# Patient Record
Sex: Female | Born: 1940
Health system: Southern US, Community
[De-identification: ages and names within clinical notes are randomized; demographics above are authoritative.]

## PROBLEM LIST (undated history)

## (undated) DIAGNOSIS — IMO0002 Reserved for concepts with insufficient information to code with codable children: Secondary | ICD-10-CM

## (undated) DIAGNOSIS — R9430 Abnormal result of cardiovascular function study, unspecified: Secondary | ICD-10-CM

## (undated) DIAGNOSIS — E785 Hyperlipidemia, unspecified: Secondary | ICD-10-CM

## (undated) DIAGNOSIS — I2510 Atherosclerotic heart disease of native coronary artery without angina pectoris: Secondary | ICD-10-CM

## (undated) DIAGNOSIS — I779 Disorder of arteries and arterioles, unspecified: Secondary | ICD-10-CM

## (undated) DIAGNOSIS — M069 Rheumatoid arthritis, unspecified: Secondary | ICD-10-CM

## (undated) DIAGNOSIS — R609 Edema, unspecified: Secondary | ICD-10-CM

## (undated) DIAGNOSIS — I739 Peripheral vascular disease, unspecified: Secondary | ICD-10-CM

## (undated) DIAGNOSIS — Z789 Other specified health status: Secondary | ICD-10-CM

## (undated) DIAGNOSIS — Z79899 Other long term (current) drug therapy: Secondary | ICD-10-CM

## (undated) HISTORY — DX: Hyperlipidemia, unspecified: E78.5

## (undated) HISTORY — DX: Other long term (current) drug therapy: Z79.899

## (undated) HISTORY — DX: Disorder of arteries and arterioles, unspecified: I77.9

## (undated) HISTORY — PX: ABDOMINAL HYSTERECTOMY: SHX81

## (undated) HISTORY — DX: Abnormal result of cardiovascular function study, unspecified: R94.30

## (undated) HISTORY — PX: CHOLECYSTECTOMY: SHX55

## (undated) HISTORY — DX: Other specified health status: Z78.9

## (undated) HISTORY — DX: Peripheral vascular disease, unspecified: I73.9

## (undated) HISTORY — DX: Reserved for concepts with insufficient information to code with codable children: IMO0002

## (undated) HISTORY — DX: Atherosclerotic heart disease of native coronary artery without angina pectoris: I25.10

## (undated) HISTORY — DX: Edema, unspecified: R60.9

## (undated) HISTORY — DX: Rheumatoid arthritis, unspecified: M06.9

---

## 1998-03-20 ENCOUNTER — Emergency Department (HOSPITAL_COMMUNITY): Admission: EM | Admit: 1998-03-20 | Discharge: 1998-03-20 | Payer: Self-pay | Admitting: Emergency Medicine

## 1998-08-02 ENCOUNTER — Ambulatory Visit (HOSPITAL_COMMUNITY): Admission: RE | Admit: 1998-08-02 | Discharge: 1998-08-02 | Payer: Self-pay | Admitting: Family Medicine

## 1999-09-16 ENCOUNTER — Ambulatory Visit (HOSPITAL_COMMUNITY): Admission: RE | Admit: 1999-09-16 | Discharge: 1999-09-16 | Payer: Self-pay | Admitting: Family Medicine

## 1999-09-16 ENCOUNTER — Encounter: Payer: Self-pay | Admitting: Family Medicine

## 2000-04-06 ENCOUNTER — Emergency Department (HOSPITAL_COMMUNITY): Admission: EM | Admit: 2000-04-06 | Discharge: 2000-04-06 | Payer: Self-pay | Admitting: Emergency Medicine

## 2000-04-06 ENCOUNTER — Encounter: Payer: Self-pay | Admitting: Emergency Medicine

## 2000-10-28 ENCOUNTER — Ambulatory Visit (HOSPITAL_COMMUNITY): Admission: RE | Admit: 2000-10-28 | Discharge: 2000-10-28 | Payer: Self-pay | Admitting: Family Medicine

## 2000-10-28 ENCOUNTER — Encounter: Payer: Self-pay | Admitting: Family Medicine

## 2001-05-17 ENCOUNTER — Emergency Department (HOSPITAL_COMMUNITY): Admission: EM | Admit: 2001-05-17 | Discharge: 2001-05-18 | Payer: Self-pay

## 2001-05-18 ENCOUNTER — Ambulatory Visit (HOSPITAL_COMMUNITY): Admission: RE | Admit: 2001-05-18 | Discharge: 2001-05-18 | Payer: Self-pay

## 2001-05-30 ENCOUNTER — Encounter (HOSPITAL_BASED_OUTPATIENT_CLINIC_OR_DEPARTMENT_OTHER): Payer: Self-pay | Admitting: General Surgery

## 2001-06-01 ENCOUNTER — Encounter (HOSPITAL_BASED_OUTPATIENT_CLINIC_OR_DEPARTMENT_OTHER): Payer: Self-pay | Admitting: General Surgery

## 2001-06-01 ENCOUNTER — Ambulatory Visit (HOSPITAL_COMMUNITY): Admission: RE | Admit: 2001-06-01 | Discharge: 2001-06-03 | Payer: Self-pay | Admitting: General Surgery

## 2001-06-01 ENCOUNTER — Encounter (INDEPENDENT_AMBULATORY_CARE_PROVIDER_SITE_OTHER): Payer: Self-pay | Admitting: *Deleted

## 2001-06-03 ENCOUNTER — Encounter: Payer: Self-pay | Admitting: Cardiovascular Disease

## 2002-12-26 ENCOUNTER — Ambulatory Visit (HOSPITAL_COMMUNITY): Admission: RE | Admit: 2002-12-26 | Discharge: 2002-12-26 | Payer: Self-pay | Admitting: Cardiology

## 2004-08-28 ENCOUNTER — Encounter: Admission: RE | Admit: 2004-08-28 | Discharge: 2004-08-28 | Payer: Self-pay | Admitting: Rheumatology

## 2004-11-28 ENCOUNTER — Ambulatory Visit: Payer: Self-pay | Admitting: Cardiology

## 2006-03-02 ENCOUNTER — Ambulatory Visit: Payer: Self-pay | Admitting: Cardiology

## 2006-07-02 ENCOUNTER — Ambulatory Visit (HOSPITAL_COMMUNITY): Admission: RE | Admit: 2006-07-02 | Discharge: 2006-07-02 | Payer: Self-pay | Admitting: Family Medicine

## 2006-09-18 ENCOUNTER — Encounter: Admission: RE | Admit: 2006-09-18 | Discharge: 2006-09-18 | Payer: Self-pay

## 2007-04-26 ENCOUNTER — Ambulatory Visit: Payer: Self-pay | Admitting: Cardiology

## 2007-12-01 ENCOUNTER — Ambulatory Visit: Payer: Self-pay | Admitting: Gastroenterology

## 2007-12-13 ENCOUNTER — Ambulatory Visit: Payer: Self-pay | Admitting: Gastroenterology

## 2008-08-08 ENCOUNTER — Ambulatory Visit: Payer: Self-pay | Admitting: Cardiology

## 2008-08-16 ENCOUNTER — Ambulatory Visit: Payer: Self-pay

## 2009-07-09 ENCOUNTER — Encounter (INDEPENDENT_AMBULATORY_CARE_PROVIDER_SITE_OTHER): Payer: Self-pay | Admitting: *Deleted

## 2010-07-16 ENCOUNTER — Inpatient Hospital Stay (HOSPITAL_COMMUNITY): Admission: EM | Admit: 2010-07-16 | Discharge: 2010-07-18 | Payer: Self-pay | Admitting: Emergency Medicine

## 2010-07-16 ENCOUNTER — Encounter: Payer: Self-pay | Admitting: Cardiology

## 2010-07-21 ENCOUNTER — Encounter: Payer: Self-pay | Admitting: Cardiology

## 2010-07-22 ENCOUNTER — Ambulatory Visit: Payer: Self-pay | Admitting: Cardiology

## 2010-08-27 ENCOUNTER — Ambulatory Visit: Payer: Self-pay | Admitting: Cardiology

## 2010-09-23 ENCOUNTER — Ambulatory Visit: Payer: Self-pay | Admitting: Cardiology

## 2010-09-23 ENCOUNTER — Ambulatory Visit: Payer: Self-pay

## 2010-09-23 ENCOUNTER — Ambulatory Visit (HOSPITAL_COMMUNITY): Admission: RE | Admit: 2010-09-23 | Discharge: 2010-09-23 | Payer: Self-pay | Admitting: Cardiology

## 2010-09-23 ENCOUNTER — Encounter: Payer: Self-pay | Admitting: Cardiology

## 2010-12-18 NOTE — Assessment & Plan Note (Signed)
Summary: 10day f/u per check out on 9/6/lg  Medications Added FISH OIL   OIL (FISH OIL) 1000mg  once daily      Allergies Added:   Visit Type:  Follow-up Primary Provider:  Dorisann Frames, MD  CC:  CAD.  History of Present Illness: The patient is seen today to followup CAD and edema.  She is not having any chest pain.  I saw her last July 22, 2010.  At that time she had a mild amount of edema.  We put her on a small dose of Lasix with a small dose of potassium and she has diuresed and feels better.    Current Medications (verified): 1)  Prednisone 5 Mg Tabs (Prednisone) .... 2 Tabs Once Daily 2)  Aspirin Ec 325 Mg Tbec (Aspirin) .... Take One Tablet By Mouth Daily 3)  Glimepiride 2 Mg Tabs (Glimepiride) .... Once Daily 4)  Losartan Potassium 100 Mg Tabs (Losartan Potassium) .... Take 1 Tablet By Mouth Once A Day 5)  Methotrexate 2.5 Mg Tabs (Methotrexate Sodium) .... 7 Tabs Every Saturday 6)  Multivitamins   Tabs (Multiple Vitamin) .... Once Daily 7)  Onglyza 5 Mg Tabs (Saxagliptin Hcl) .... Once Daily 8)  Remicade 100 Mg Solr (Infliximab) .... Iv Every 6 Weeks 9)  Acetaminophen 500 Mg  Caps (Acetaminophen) .... Every 4 Hrs As Needed 10)  Furosemide 20 Mg Tabs (Furosemide) .... Take One Tablet By Mouth Daily. 11)  Potassium Chloride Cr 10 Meq Cr-Caps (Potassium Chloride) .... Take One Tablet By Mouth Daily 12)  Fish Oil   Oil (Fish Oil) .... 1000mg  Once Daily  Allergies (verified): 1)  ! Hydrocodone 2)  ! * Statins  Past History:  Past Medical History:  CAD (ICD-414.00)   Cath...2004...60%  distal LAD, 80% osteal Cx (not optimal for PCI), 70%  small RCA..medical therapy  /   nuclear.Marland KitchenMarland Kitchen6/2005...EF  65%...no ischemia * HX INTERMITTANT STEROID USE RHEUMATOID ARTHRITIS (ICD-714.0)....  /  hospitalization August, 2011.... severe RA flare.... DYSLIPIDEMIA (ICD-272.4) Statin intolerance Right carotid bruit Leg discomfort   2009 Edema    September, 2011...improved with  Lasix  Review of Systems       Patient denies fever, chills, headache, sweats, rash, change in vision, change in hearing, chest pain, cough, nausea vomiting, urinary symptoms.  All of the systems are reviewed and are negative.  Vital Signs:  Patient profile:   70 year old female Height:      62 inches Weight:      166 pounds BMI:     30.47 Pulse rate:   70 / minute BP sitting:   132 / 78  (left arm) Cuff size:   regular  Vitals Entered By: Hardin Negus, RMA (August 27, 2010 10:04 AM)  Physical Exam  General:  patient is stable. Head:  head is atraumatic. Eyes:  no xanthelasma. Neck:  there is a soft right carotid bruit.  No jugular venous distention. Chest Wall:  no chest wall tenderness. Lungs:  lungs are clear.  Respiratory effort is nonlabored. Heart:  cardiac exam reveals S1-S2.  There are no clicks or significant murmurs. Abdomen:  abdomen is soft. Msk:  no musculoskeletal deformities. Extremities:  no peripheral edema today. Skin:  no skin rashes. Psych:  patient is oriented to person time and place.  Affect is normal.   Impression & Recommendations:  Problem # 1:  * RIGHT CAROTID BRUIT OCTOBER, 2011 There is a very soft right carotid bruit.  I have carefully reviewed the records and there  is no record of a carotid Doppler ever being done.  Carotid Doppler will be scheduled.  He'll be in touch with her with the result.  Problem # 2:  EDEMA (ICD-782.3) Edema is improved with low-dose Lasix.  This is to be continued.  Problem # 3:  CAD (ICD-414.00)  Her updated medication list for this problem includes:    Aspirin Ec 325 Mg Tbec (Aspirin) .[name]... Take one tablet by mouth daily Coronary disease is stable.  The last evaluation that the patient has had was in 2005 with a nuclear scan at that time.  Her ejection fraction was normal at that time.  We need to reassess her LV function.  This was begun with a two-dimensional echo.  I will be in touch with her with the  result.  Orders: Echocardiogram (Echo)  Problem # 4:  DYSLIPIDEMIA (ICD-272.4) Unfortunately the patient is statin intolerant.  No change in therapy.  Other Orders: Carotid Duplex (Carotid Duplex)  Patient Instructions: 1)  Your physician has requested that you have a carotid duplex. This test is an ultrasound of the carotid arteries in your neck. It looks at blood flow through these arteries that supply the brain with blood. Allow one hour for this exam. There are no restrictions or special instructions. 2)  Your physician has requested that you have an echocardiogram.  Echocardiography is a painless test that uses sound waves to create images of your heart. It provides your doctor with information about the size and shape of your heart and how well your heart's chambers and valves are working.  This procedure takes approximately one hour. There are no restrictions for this procedure. 3)  Your physician wants you to follow-up in:  1 year.  You will receive a reminder letter in the mail two months in advance. If you don't receive a letter, please call our office to schedule the follow-up appointment.

## 2010-12-18 NOTE — Assessment & Plan Note (Signed)
Summary: f2y  Medications Added PREDNISONE 5 MG TABS (PREDNISONE) 2 tabs once daily PROTONIX 40 MG TBEC (PANTOPRAZOLE SODIUM) once daily ASPIRIN EC 325 MG TBEC (ASPIRIN) Take one tablet by mouth daily GLIMEPIRIDE 2 MG TABS (GLIMEPIRIDE) once daily LOSARTAN POTASSIUM 100 MG TABS (LOSARTAN POTASSIUM) Take 1 tablet by mouth once a day METHOTREXATE 2.5 MG TABS (METHOTREXATE SODIUM) 7 tabs every Saturday MULTIVITAMINS   TABS (MULTIPLE VITAMIN) once daily ONGLYZA 5 MG TABS (SAXAGLIPTIN HCL) once daily REMICADE 100 MG SOLR (INFLIXIMAB) IV every 6 weeks ACETAMINOPHEN 500 MG  CAPS (ACETAMINOPHEN) every 4 hrs as needed FUROSEMIDE 20 MG TABS (FUROSEMIDE) Take one tablet by mouth daily. POTASSIUM CHLORIDE CR 10 MEQ CR-CAPS (POTASSIUM CHLORIDE) Take one tablet by mouth daily      Allergies Added: ! HYDROCODONE ! * STATINS  Visit Type:  Follow-up Primary Provider:  Dorisann Frames, MD  CC:  CAD.  History of Present Illness: The patient is seen for follow up of coronary disease.  She is not having any chest pain.  Unfortunately she does have edema.  Also she was hospitalized on July 16, 2010 with a flare of her rheumatoid arthritis.  This was quite painful but stabilized.  She is on low-dose steroids.  Patient underwent catheterization in 2004.  She had a nuclear scan in 2005 showing no ischemia and an ejection fraction of 65%.  Current Medications (verified): 1)  Prednisone 5 Mg Tabs (Prednisone) .... 2 Tabs Once Daily 2)  Protonix 40 Mg Tbec (Pantoprazole Sodium) .... Once Daily 3)  Aspirin Ec 325 Mg Tbec (Aspirin) .... Take One Tablet By Mouth Daily 4)  Glimepiride 2 Mg Tabs (Glimepiride) .... Once Daily 5)  Losartan Potassium 100 Mg Tabs (Losartan Potassium) .... Take 1 Tablet By Mouth Once A Day 6)  Methotrexate 2.5 Mg Tabs (Methotrexate Sodium) .... 7 Tabs Every Saturday 7)  Multivitamins   Tabs (Multiple Vitamin) .... Once Daily 8)  Onglyza 5 Mg Tabs (Saxagliptin Hcl) .... Once  Daily 9)  Remicade 100 Mg Solr (Infliximab) .... Iv Every 6 Weeks 10)  Acetaminophen 500 Mg  Caps (Acetaminophen) .... Every 4 Hrs As Needed  Allergies (verified): 1)  ! Hydrocodone 2)  ! * Statins  Past History:  Past Medical History:  CAD (ICD-414.00)   Cath...2004...60%  distal LAD, 80% osteal Cx (not optimal for PCI), 70%  small RCA..medical therapy  /   nuclear.Marland KitchenMarland Kitchen6/2005...EF  65%...no ischemia * HX INTERMITTANT STEROID USE RHEUMATOID ARTHRITIS (ICD-714.0)....  /  hospitalization August, 2011.... severe RA flare.... DYSLIPIDEMIA (ICD-272.4) Leg discomfort   2009 Edema    September, 2011  Review of Systems       Patient denies fever, chills, headache, sweats, rash, change in vision, change in hearing, chest pain, cough, nausea vomiting, urinary symptoms.  All of the systems are reviewed and are negative.  Vital Signs:  Patient profile:   70 year old female Height:      62 inches Weight:      171 pounds BMI:     31.39 Pulse rate:   59 / minute BP sitting:   122 / 80  (left arm) Cuff size:   regular  Vitals Entered By: Hardin Negus, RMA (July 22, 2010 3:31 PM)  Physical Exam  General:  patient is stable today.  She is overweight. Head:  head is atraumatic. Eyes:  no xanthelasma. Neck:  no jugular distention. Chest Wall:  no chest wall tenderness. Lungs:  lungs are clear.  Respiratory effort is nonlabored. Heart:  cardiac exam reveals S1-S2.  No clicks or significant murmurs. Abdomen:  abdomen is soft. Msk:  no inflamed joints at this time. Extremities:  patient has 1+ peripheral edema Skin:  no skin rashes. Psych:  patient is oriented to person time and place.  Affect is normal.   Impression & Recommendations:  Problem # 1:  EDEMA (ICD-782.3) The patient has significant edema.  She thinks it may be related to Cozaar.  She is on low-dose steroids.  My preference is to try her on a low dose of a diuretic.  This will be done very carefully.  We'll see her  for early followup.  We'll also place her on a low-dose potassium.  I know from her recent hospitalization and her renal function was normal her potassium was 3.8 at that time.  As part of this evaluation I have reviewed the discharge summary from her hospitalization and also the admission history and physical.  Problem # 2:  CAD (ICD-414.00)  Her updated medication list for this problem includes:    Aspirin Ec 325 Mg Tbec (Aspirin) .[name]... Take one tablet by mouth daily  Orders: EKG w/ Interpretation (93000) Coronary disease is stable.  EKG is done today and reviewed by me.  There is no change.  Patient has not had an exercise study many years.  We will look into doing this after her volume status is stable.  Problem # 3:  * HX INTERMITTANT STEROID USE The patient currently is on steroids.  This certainly can affect her volume status.  Problem # 4:  RHEUMATOID ARTHRITIS (ICD-714.0) the patient had a recent severe flare of her rheumatoid arthritis.  I'm hopeful that changing her volume status a little bit will not cause any problems.  Patient Instructions: 1)  Start Furosemide 20mg  daily 2)  Start Potassium daily 3)  Follow up in 10days Prescriptions: POTASSIUM CHLORIDE CR 10 MEQ CR-CAPS (POTASSIUM CHLORIDE) Take one tablet by mouth daily  #30 x 6   Entered by:   Meredith Staggers, RN   Authorized by:   Talitha Givens, MD, [facility]   Signed by:   Meredith Staggers, RN on 07/22/2010   Method used:   Electronically to        [facility] [address] [phone]* (retail)       [address]       [address]       [place], [place]  [address]       Ph: [phone]       Fax: [phone]   RxID:   [id number] FUROSEMIDE 20 MG TABS (FUROSEMIDE) Take one tablet by mouth daily.  #30 x 6   Entered by:   Meredith Staggers, RN   Authorized by:   Talitha Givens, MD, [facility]   Signed by:   Meredith Staggers, RN on 07/22/2010   Method used:   Electronically to        [facility] [address] [phone]*  (retail)       [address]       [address]       [place], [place]  [address]       Ph: [phone]       Fax: [phone]   RxID:   [id number]

## 2010-12-18 NOTE — Miscellaneous (Signed)
  Clinical Lists Changes  Problems: Added new problem of * LEG DISCOMFORT Observations: Added new observation of PAST MED HX:  CAD (ICD-414.00)   Cath...2004...60%  distal LAD, 80% osteal Cx (not optimal for PCI), 70%  small RCA..medical therapy  /   nuclear.Marland KitchenMarland Kitchen6/2005...EF  65%...no ischemia * HX INTERMITTANT STEROID USE RHEUMATOID ARTHRITIS (ICD-714.0) DYSLIPIDEMIA (ICD-272.4) Leg discomfort   2009     (07/21/2010 13:39)       Past History:  Past Medical History:  CAD (ICD-414.00)   Cath...2004...60%  distal LAD, 80% osteal Cx (not optimal for PCI), 70%  small RCA..medical therapy  /   nuclear.Marland KitchenMarland Kitchen6/2005...EF  65%...no ischemia * HX INTERMITTANT STEROID USE RHEUMATOID ARTHRITIS (ICD-714.0) DYSLIPIDEMIA (ICD-272.4) Leg discomfort   2009

## 2010-12-18 NOTE — Letter (Signed)
Summary: [facility]  [facility]   Imported By: Marylou Mccoy 07/22/2010 17:36:04  _____________________________________________________________________  External Attachment:    Type:   Image     Comment:   External Document

## 2011-01-29 LAB — COMPREHENSIVE METABOLIC PANEL
AST: 25 U/L (ref 0–37)
Albumin: 3.3 g/dL — ABNORMAL LOW (ref 3.5–5.2)
BUN: 12 mg/dL (ref 6–23)
Creatinine, Ser: 0.83 mg/dL (ref 0.4–1.2)
GFR calc Af Amer: >60 mL/min (ref >60)
Total Protein: 7.6 g/dL (ref 6.0–8.3)

## 2011-01-29 LAB — MAGNESIUM: Magnesium: 1.7 mg/dL (ref 1.5–2.5)

## 2011-01-29 LAB — URINALYSIS, ROUTINE W REFLEX MICROSCOPIC
Bilirubin Urine: NEGATIVE
Glucose, UA: NEGATIVE mg/dL
Ketones, ur: NEGATIVE mg/dL
Leukocytes, UA: NEGATIVE
Nitrite: NEGATIVE
Protein, ur: NEGATIVE mg/dL
Specific Gravity, Urine: 1.02 (ref 1.005–1.030)
Urobilinogen, UA: 1 mg/dL (ref 0.0–1.0)
pH: 5.5 (ref 5.0–8.0)

## 2011-01-29 LAB — BODY FLUID CULTURE

## 2011-01-29 LAB — CULTURE, BLOOD (ROUTINE X 2)
Culture: NO GROWTH
Culture: NO GROWTH

## 2011-01-29 LAB — CBC
HCT: 31.4 % — ABNORMAL LOW (ref 36.0–46.0)
HCT: 32 % — ABNORMAL LOW (ref 36.0–46.0)
HCT: 35.1 % — ABNORMAL LOW (ref 36.0–46.0)
Hemoglobin: 10.3 g/dL — ABNORMAL LOW (ref 12.0–15.0)
Hemoglobin: 10.6 g/dL — ABNORMAL LOW (ref 12.0–15.0)
Hemoglobin: 11.7 g/dL — ABNORMAL LOW (ref 12.0–15.0)
MCH: 28.2 pg (ref 26.0–34.0)
MCH: 28.3 pg (ref 26.0–34.0)
MCH: 28.5 pg (ref 26.0–34.0)
MCHC: 32.8 g/dL (ref 30.0–36.0)
MCHC: 33.1 g/dL (ref 30.0–36.0)
MCHC: 33.3 g/dL (ref 30.0–36.0)
MCV: 85.6 fL (ref 78.0–100.0)
MCV: 86 fL (ref 78.0–100.0)
MCV: 85.4 fL (ref 78.0–100.0)
Platelets: 240 10*3/uL (ref 150–400)
Platelets: 253 10*3/uL (ref 150–400)
Platelets: 267 10*3/uL (ref 150–400)
RBC: 3.65 MIL/uL — ABNORMAL LOW (ref 3.87–5.11)
RBC: 3.74 MIL/uL — ABNORMAL LOW (ref 3.87–5.11)
RBC: 4.11 MIL/uL (ref 3.87–5.11)
RDW: 15 % (ref 11.5–15.5)
RDW: 15.2 % (ref 11.5–15.5)
RDW: 14.9 % (ref 11.5–15.5)
WBC: 11 10*3/uL — ABNORMAL HIGH (ref 4.0–10.5)
WBC: 9.9 10*3/uL (ref 4.0–10.5)
WBC: 8.4 10*3/uL (ref 4.0–10.5)

## 2011-01-29 LAB — GLUCOSE, CAPILLARY
Glucose-Capillary: 111 mg/dL — ABNORMAL HIGH (ref 70–99)
Glucose-Capillary: 182 mg/dL — ABNORMAL HIGH (ref 70–99)
Glucose-Capillary: 187 mg/dL — ABNORMAL HIGH (ref 70–99)
Glucose-Capillary: 189 mg/dL — ABNORMAL HIGH (ref 70–99)
Glucose-Capillary: 217 mg/dL — ABNORMAL HIGH (ref 70–99)
Glucose-Capillary: 225 mg/dL — ABNORMAL HIGH (ref 70–99)

## 2011-01-29 LAB — C-REACTIVE PROTEIN: CRP: 2.9 mg/dL — ABNORMAL HIGH (ref <0.6)

## 2011-01-29 LAB — DIFFERENTIAL
Basophils Absolute: 0 10*3/uL (ref 0.0–0.1)
Basophils Relative: 0 % (ref 0–1)
Eosinophils Absolute: 0.1 10*3/uL (ref 0.0–0.7)
Eosinophils Relative: 1 % (ref 0–5)
Lymphocytes Relative: 16 % (ref 12–46)
Lymphocytes Relative: 11 % — ABNORMAL LOW (ref 12–46)
Lymphs Abs: 1.5 10*3/uL (ref 0.7–4.0)
Lymphs Abs: 1 10*3/uL (ref 0.7–4.0)
Monocytes Absolute: 1.6 10*3/uL — ABNORMAL HIGH (ref 0.1–1.0)
Monocytes Absolute: 0.8 10*3/uL (ref 0.1–1.0)
Monocytes Relative: 16 % — ABNORMAL HIGH (ref 3–12)
Monocytes Relative: 10 % (ref 3–12)
Neutro Abs: 6.7 10*3/uL (ref 1.7–7.7)
Neutro Abs: 6.6 10*3/uL (ref 1.7–7.7)
Neutrophils Relative %: 67 % (ref 43–77)

## 2011-01-29 LAB — BASIC METABOLIC PANEL
BUN: 4 mg/dL — ABNORMAL LOW (ref 6–23)
CO2: 23 mEq/L (ref 19–32)
Calcium: 8.6 mg/dL (ref 8.4–10.5)
Chloride: 109 mEq/L (ref 96–112)
Creatinine, Ser: 0.76 mg/dL (ref 0.4–1.2)
GFR calc Af Amer: >60 mL/min (ref >60)
GFR calc non Af Amer: >60 mL/min (ref >60)
Glucose, Bld: 121 mg/dL — ABNORMAL HIGH (ref 70–99)
Potassium: 3.5 mEq/L (ref 3.5–5.1)
Sodium: 138 mEq/L (ref 135–145)

## 2011-01-29 LAB — APTT: aPTT: 35 seconds (ref 24–37)

## 2011-01-29 LAB — GRAM STAIN

## 2011-01-29 LAB — LACTIC ACID, PLASMA: Lactic Acid, Venous: 1.1 mmol/L (ref 0.5–2.2)

## 2011-01-29 LAB — BODY FLUID CRYSTAL

## 2011-01-29 LAB — PHOSPHORUS: Phosphorus: 2.4 mg/dL (ref 2.3–4.6)

## 2011-01-29 LAB — PROTIME-INR
INR: 1.11 (ref 0.00–1.49)
Prothrombin Time: 14.5 seconds (ref 11.6–15.2)

## 2011-01-29 LAB — ANAEROBIC CULTURE

## 2011-01-29 LAB — SEDIMENTATION RATE: Sed Rate: 48 mm/hr — ABNORMAL HIGH (ref 0–22)

## 2011-01-29 LAB — HIGH SENSITIVITY CRP: CRP, High Sensitivity: 24.1 mg/L — ABNORMAL HIGH

## 2011-01-29 LAB — URINE MICROSCOPIC-ADD ON

## 2011-03-31 NOTE — Assessment & Plan Note (Signed)
[facility]                            CARDIOLOGY OFFICE NOTE   NAME:[name], [name] COTHAM                      MRN:          [id number]  DATE:08/08/2008                            DOB:          [birth year]    [name] [name] is seen for cardiology followup.  I saw her last in June 2008.  She does have coronary disease.  Her last cath was in 2004 and she had a  60% distal LAD, an 80% ostial circumflex, and 70% right coronary lesion.  It was felt that the circumflex lesion was the most significant and it  was not optimal for PCI.  The right coronary was too small for stenting.  She is not having any significant chest pain and she is not having any  significant shortness of breath.  This cath was done in 2004.  In 2005,  she had a nuclear exercise test.  It was done with adenosine.  There was  no marked ischemia.   The patient had been on Imdur 60 mg.  She has had multiple body aches  and pains and she feels that some of them are related to medicines;  therefore, she has held her Imdur.  I would like for her to restart it  at a lower dose.   PAST MEDICAL HISTORY:   ALLERGIES:  CODEINE.   MEDICATIONS:  1. Aspirin 325.  2. Multivitamin.  3. Imdur 30.  4. Folic acid.  5. Enbrel 50 once weekly.  6. Coenzyme Q10.  7. Methotrexate.  8. Red yeast rice.   OTHER MEDICAL PROBLEMS:  See the list below.   REVIEW OF SYSTEMS:  She explained to me the different difficulty she has  had with pain in her knees, legs and arms.  There was question that her  diabetic medicines might be causing some of the aches and pains as the  statins had done.  She did have significant discomfort in her right leg.  This is not necessarily with exertion.  Otherwise review of systems is  negative.   PHYSICAL EXAMINATION:  VITAL SIGNS:  Weight is 155 pounds which is down  substantially since her prior visit.  Blood pressure is 130/90.  (Blood  pressure was 120/80 at Dr. Willeen Cass office  yesterday).  Her pulse is 84.  GENERAL:  The patient is oriented to person, time and place.  Affect is  normal.  HEENT:  No xanthelasma.  She has normal extraocular motion.  NECK:  There are no carotid bruits.  There is no jugular venous  distention.  LUNGS:  Clear.  Respiratory effort is not labored.  CARDIAC:  S1 and S2.  There are no clicks or significant murmurs.  ABDOMEN:  Soft.  EXTREMITIES:  The patient has 2+ pulses in her left lower extremity, but  only 1+ in the right lower extremity.  There is slight differential with  the lower pulse in the right leg.   EKG reveals sinus rhythm with no acute changes.   PROBLEMS:  1. History of rheumatoid arthritis on medication.  2. History of some intermittent steroids for her rheumatoid  arthritis      in the past.  3. Dyslipidemia.  The patient cannot tolerate statins.  She is on red      yeast rice.  4. Coronary artery disease.  She has significant disease as outlined      above, but she is stable.  She does not need cardiac testing at      this point.  5. Blood pressure.  It is borderline today and she is off of her      Imdur.  We will restart her at a lower dose of 30 mg daily.  6. Discomfort in her legs.  It would appear that this is most likely      musculoskeletal or related to her medications.  However, with some      pulse differential in her feet and known vascular disease, I feel      we should do arterial Dopplers of her legs to be sure.  We will      obtain this information and be in touch with her.  I will then see      her back in 1 year for cardiology followup.     Luis Abed, MD, [facility]  Electronically Signed    JDK/MedQ  DD: 08/08/2008  DT: 08/08/2008  Job #: [id number]   cc:   Ace Gins, MD  Areatha Keas, M.D.  Dorisann Frames, M.D.

## 2011-03-31 NOTE — Assessment & Plan Note (Signed)
[facility]                            CARDIOLOGY OFFICE NOTE   NAME:Slusher, BRESHAE BELCHER                      MRN:          [id number]  DATE:04/26/2007                            DOB:          19-Jun-1941    Ms. [name] [name] does have coronary disease. She underwent catheterization in  2004 and she does have 60% distal LAD, 80% ostial circumflex and a 70%  right coronary lesion. It was felt that the most significant lesion was  the circumflex and it was not optimal for PCI. The right coronary was  too small for stenting and she is treated medically and has done well.  She is not having any significant chest pain. She is not having any  significant shortness of breath.   Unfortunately she has not tolerated statins over time. Repeatedly she  has had muscle aches and pain when statin has been tried.   PAST MEDICAL HISTORY:   ALLERGIES:  No known drug allergies.   MEDICATIONS:  1. Vitamin E. From the cardiology view point, she does not need      vitamin E and I will leave this up to Dr. Larina Bras.  2. Aspirin 325.  3. Multivitamin.  4. Imdur 60.  5. Folic acid.  6. Enbrel.  7. Avandaryl.  8. Diovan 80.   OTHER MEDICAL PROBLEMS:  See the list below.   REVIEW OF SYSTEMS:  She mentioned some peripheral edema today. When I  talked with her she clearly eats too much salt and does drink extra  fluids and I have encouraged her to back off on both of these. I have  not adjusted her medicines. I do not believe this is heart failure.  Otherwise her review of systems is negative.   PHYSICAL EXAMINATION:  VITAL SIGNS:  Blood pressure is 120/78 with a  pulse of 61. Weight is 183 pounds.  GENERAL:  The patient is oriented to person, time and place. Affect is  normal.  HEENT:  She has no xanthelasma. There is normal extraocular motion.  Conjunctiva are normal. There are no carotid bruits. There is no jugular  venous distention.  LUNGS:  Clear. Respiratory effort is not  labored.  CARDIAC:  Reveals an S1 with an S2. There are no clicks or significant  murmurs.  ABDOMEN:  Obese but soft. She does have trace peripheral edema.   EKG is normal.   PROBLEM LIST:  1. History of rheumatoid arthritis. She had stopped her medication on      her own and has seen Dr. Phylliss Bob and this has been restarted.  2. History intermittent steroids for her rheumatoid arthritis.  3. Dyslipidemia. Unfortunately she cannot tolerate any statins.  4. Coronary disease. She does have significant coronary disease but      she is stable. I am not recommending any testing at this time. I      will see her at any time if she has problems. I will see her back      in one year.     Luis Abed, MD, [facility]  Electronically Signed    JDK/MedQ  DD: 04/26/2007  DT: 04/26/2007  Job #: [id number]   cc:   Ace Gins, MD  Areatha Keas, M.D.

## 2011-04-03 NOTE — Op Note (Signed)
[place]  Patient:    [name] [name], [name] [name]                      MRN: [id number] Proc. Date: 06/01/01 Adm. Date:  [id number] Attending:  Sonda Primes                           Operative Report  PREOPERATIVE DIAGNOSIS:  Chronic calculus cholecystitis.  POSTOPERATIVE DIAGNOSIS:  Chronic calculus cholecystitis.  OPERATION PERFORMED:  Laparoscopic cholecystectomy with intraoperative cholangiogram.  SURGEON:  Luisa Hart L. Lurene Shadow, M.D.  ASSISTANT:  Marnee Spring. Wiliam Ke, M.D.  ANESTHESIA:  General.  INDICATIONS FOR PROCEDURE:  The patient is a 70 year old woman presenting with recurrent episodes of epigastric pain, nausea and vomiting, gallbladder ultrasound revealing cholelithiasis without any evidence of acute cholecystitis.  Liver function studies are within normal limits, no hyperamylasemia or elevated lipase levels were noted.  The patient is brought now to the operating room for laparoscopic cholecystectomy and routine intraoperative cholangiogram.  DESCRIPTION OF PROCEDURE:  Following induction of satisfactory general anesthesia, the patient was positioned supinely.  The abdomen was routinely prepped and draped to be included in a sterile operative field.  Open laparoscopy was created at the umbilicus.  Insertion of a Hasson cannula, insufflation of the peritoneal cavity using carbon dioxide up to 14 mmHg. Camera was inserted and visual exploration of the abdomen carried out. Multiple adhesions to the gallbladder noted.  The gallbladder was chronically scarred.  Liver edges were sharp.  Liver surface was smooth.  Anterior gastric wall and duodenal sweep appeared to be normal.  None of the small or large intestine viewed appeared to be abnormal.  Under direct vision, epigastric and lateral ports were placed.  The gallbladder was grasped and retracted cephalad and multiple adhesions of the omentum to the gallbladder and the duodenum were taken  down.  The dissection carried down to ampulla of the gallbladder at the hepatoduodenal ligament. The cystic duct and cystic artery were identified and dissected free dissecting the cystic artery up to its entry into the gallbladder wall and cystic duct to the gallbladder cystic duct junction.  The cystic artery was doubly clipped and transected.  The cystic duct was clipped proximally and opened.  I inserted a Reddick catheter into the abdomen and into the cystic duct.  Through it I injected one half strength Hypaque into the biliary system doing a fluoroscopic cholangiogram.  This showed flow of contrast through the biliary system down into the duodenum without any evidence of filling defects. The cystic duct cholangiogram catheter was removed.  The cystic duct was doubly clipped and transected and the gallbladder was dissected free from the liver using electrocautery.  At the end of the dissection, all areas of the liver bed were checked for hemostasis, additional bleeding points treated with electrocautery.  Right upper quadrant thoroughly irrigated with normal saline. The camera was moved to the epigastric port and the gallbladder was retrieved through the umbilical port without difficulty.  Sponge, instrument and sharp counts were verified.  The trocars were removed under direct vision.  The wound was closed in layers as follows after the pneumoperitoneum was allowed to deflate.  Epigastric and lateral flank wounds were closed with 0 Dexon sutures.  The umbilical wound was closed with 0 Dexon and 4-0 Dexon sutures. All wounds were reinforced with Steri-Strips and sterile dressings were applied.  Anesthetic reversed.  Patient removed from the operating  room to the recovery room in stable condition having tolerated the procedure well. DD:  06/01/01 TD:  06/01/01 Job: [id number]

## 2011-04-03 NOTE — Cardiovascular Report (Signed)
NAME:  [name] [name], [name] [name] NO.:  [id number]   MEDICAL RECORD NO.:  [id number]                   PATIENT TYPE:  OIB   LOCATION:  2899                                 FACILITY:  [facility]   PHYSICIAN:  Jonelle Sidle, M.D. [facility]        DATE OF BIRTH:  06/21/1941   DATE OF PROCEDURE:  12/26/2002  DATE OF DISCHARGE:                              CARDIAC CATHETERIZATION   [place] CARDIOLOGIST:  Dr. Willa Rough, M.D.   PRIMARY CARE PHYSICIAN:  Dr. Teena Irani. Arlyce Dice.   INDICATION:  The patient is a 70 year old woman with known coronary artery  disease, status post previous myocardial infarction with angioplasty to an  occluded circumflex in August of 1997.  She had a relook angiogram one month  following that event which revealed a patent intervention site with a  residual 60% distal left anterior descending stenosis, 70% ostial circumflex  stenosis, and diffuse 60% to 70% proximal RCA stenoses with normal ejection  fraction.  She has had some recent exertional shoulder discomfort and  underwent a Cardiolite which showed an anteroapical reversible defect and a  fixed defect inferiorly.  She is referred now to evaluate the coronary  anatomy and assess for progression.   PROCEDURES PERFORMED:  1. Left heart catheterization.  2. Selective coronary angiography.  3. Left ventriculography.   ACCESS AND EQUIPMENT:  The area about the right femoral artery was  anesthetized with 1% lidocaine and a 6-French sheath was placed in the right  femoral artery via the modified Seldinger technique.  Standard preformed 6-  [place] and JR4 catheters were used for elective coronary angiography and  an angled pigtail catheter was used for left heart catheterization and left  ventriculography.  All exchanges were made over a wire and the patient  tolerated the procedure well without immediate complications.   HEMODYNAMICS:  Left ventricle 136/24 mmHg.  Aorta 138/76 mmHg.   ANGIOGRAPHIC FINDINGS:  1. Left main coronary artery has a 20% distal stenosis.  2. The left anterior descending has scattered 30% stenoses in the proximal-     to-mid-vessel segment with a 60% distal stenosis.  3. The circumflex coronary artery has an 80% ostial stenosis followed by 30%     mid-to-distal stenosis in the previous area of intervention.  4. The right coronary artery is a fairly small vessel with a large right     ventricular marginal branch that has an 80% stenosis proximally.  Within     the RCA, there are 75% and 80% proximal stenoses followed by a more     distal 60% stenosis.   LEFT VENTRICULOGRAPHY:  Left ventriculography was performed in the RAO  projection and revealed an ejection fraction of 65-70% with trace to 1+  mitral regurgitation.    DIAGNOSES:  1. Multi-vessel coronary artery disease as outlined.  The most significant     stenoses include a 60% distal left anterior descending stenosis, 80%  ostial circumflex stenosis, and 75% to 80% proximal right coronary artery     stenoses.  2. Left ventricular ejection fraction is 65-70% with trace to 1+ mitral     regurgitation.   RECOMMENDATIONS:  I discussed the films initially with Dr. Arturo Morton.  Stuckey, who plans to review these in more detail with Dr. Willa Rough  later today.  We will, in the meanwhile, add Norvasc to the patient's  medical regimen which already includes Imdur and metoprolol.  We will  anticipate discharge home today with close followup next week with Dr. Myrtis Ser  to discuss further options regarding potential revascularization.                                               Jonelle Sidle, M.D. [facility]    SGM/MEDQ  D:  12/26/2002  T:  12/26/2002  Job:  [id number]

## 2011-07-15 ENCOUNTER — Other Ambulatory Visit: Payer: Self-pay | Admitting: Cardiology

## 2011-08-17 ENCOUNTER — Encounter: Payer: Self-pay | Admitting: Cardiology

## 2011-08-17 DIAGNOSIS — M069 Rheumatoid arthritis, unspecified: Secondary | ICD-10-CM | POA: Insufficient documentation

## 2011-08-17 DIAGNOSIS — Z79899 Other long term (current) drug therapy: Secondary | ICD-10-CM | POA: Insufficient documentation

## 2011-08-17 DIAGNOSIS — R9430 Abnormal result of cardiovascular function study, unspecified: Secondary | ICD-10-CM | POA: Insufficient documentation

## 2011-08-17 DIAGNOSIS — Z789 Other specified health status: Secondary | ICD-10-CM | POA: Insufficient documentation

## 2011-08-17 DIAGNOSIS — I2510 Atherosclerotic heart disease of native coronary artery without angina pectoris: Secondary | ICD-10-CM | POA: Insufficient documentation

## 2011-08-17 DIAGNOSIS — E785 Hyperlipidemia, unspecified: Secondary | ICD-10-CM | POA: Insufficient documentation

## 2011-08-17 DIAGNOSIS — I739 Peripheral vascular disease, unspecified: Secondary | ICD-10-CM

## 2011-08-19 ENCOUNTER — Encounter: Payer: Self-pay | Admitting: Cardiology

## 2011-08-19 ENCOUNTER — Ambulatory Visit (INDEPENDENT_AMBULATORY_CARE_PROVIDER_SITE_OTHER): Payer: BC Managed Care – PPO | Admitting: Cardiology

## 2011-08-19 DIAGNOSIS — I2510 Atherosclerotic heart disease of native coronary artery without angina pectoris: Secondary | ICD-10-CM

## 2011-08-19 DIAGNOSIS — E119 Type 2 diabetes mellitus without complications: Secondary | ICD-10-CM | POA: Insufficient documentation

## 2011-08-19 DIAGNOSIS — M069 Rheumatoid arthritis, unspecified: Secondary | ICD-10-CM

## 2011-08-19 DIAGNOSIS — Z888 Allergy status to other drugs, medicaments and biological substances status: Secondary | ICD-10-CM

## 2011-08-19 DIAGNOSIS — I779 Disorder of arteries and arterioles, unspecified: Secondary | ICD-10-CM

## 2011-08-19 DIAGNOSIS — Z789 Other specified health status: Secondary | ICD-10-CM

## 2011-08-19 NOTE — Progress Notes (Signed)
HPI Patient is seen today for followup coronary artery disease.  Patient underwent catheterization in 2004.  She had moderate scattered disease.  There was an 80% ostial circumflex that was not optimal for PCI.  She has received medical therapy and done well.  Her original symptom was discomfort from her shoulders across her back.  She has not had this particular symptom.  Her last stress test was in 2005.  Ejection fraction was 60% and there was no ischemia. Patient had a two-dimensional echo November, 2011.  The EF was 60%.  There were no significant wall motion abnormalities.  Patient has been active.  In 200 11 she had severe flare of her rheumatoid arthritis.  She was hospitalized and stabilized and she's done well since.  As part of today's evaluation I have reviewed her old records carefully and updated the new EMR.   Allergies  Allergen Reactions  . Hydrocodone     REACTION: nausea  . Statins     REACTION: joints    Current Outpatient Prescriptions  Medication Sig Dispense Refill  . acetaminophen (TYLENOL) 500 MG tablet Take 500 mg by mouth every 6 (six) hours as needed.        [name] aspirin 325 MG tablet Take 325 mg by mouth daily.        . diphenhydrAMINE (BENADRYL ALLERGY) 25 mg capsule Take three days prior to remicaded infushion       . furosemide (LASIX) 20 MG tablet Take 20 mg by mouth daily.        [name] inFLIXimab (REMICADE) 100 MG injection Inject into the vein. Every 6 weeks       . insulin NPH-insulin regular (NOVOLIN 70/30) (70-30) 100 UNIT/ML injection Inject into the skin.        [name] losartan (COZAAR) 100 MG tablet Take 100 mg by mouth daily.        . methotrexate (RHEUMATREX) 2.5 MG tablet Take 2.5 mg by mouth once a week. Caution:Chemotherapy. Protect from light.. 7 tablets every saturday       . Multiple Vitamin (MULTIVITAMIN) tablet Take 1 tablet by mouth daily.        . Omega-3 Fatty Acids (FISH OIL) 1000 MG CAPS Take 1 capsule by mouth daily.        . potassium chloride  (MICRO-K) 10 MEQ CR capsule TAKE 1 CAPSULE BY MOUTH DAILY  30 capsule  4  . ranitidine (ZANTAC) 150 MG tablet 3 days prior to remicade  infushion         History   Social History  . Marital Status: Married    Spouse Name: N/A    Number of Children: N/A  . Years of Education: N/A   Occupational History  . Not on file.   Social History Main Topics  . Smoking status: Former Smoker    Quit date: 11/16/1994  . Smokeless tobacco: Not on file  . Alcohol Use: Not on file  . Drug Use: Not on file  . Sexually Active: Not on file   Other Topics Concern  . Not on file   Social History Narrative  . No narrative on file    No family history on file.  Past Medical History  Diagnosis Date  . CAD (coronary artery disease)     Catheterization 2004, 60% distal LAD, 80% ostial circumflex( not optimal for PCI), 70% small RCA, medical therapy  /  nuclear June, 2005, EF 65%, no ischemia  . Edema   . Drug therapy  Intermittent steroid use  . Rheumatoid arthritis     Hospitalization August, 2011, severe RA flare,   . Dyslipidemia   . Statin intolerance   . Ejection fraction     EF 60%, echo, November, 2011, trivial pericardial effusion  . Carotid artery disease     Doppler, November, 2011, no significant plaque, distal R. ICA velocities are elevated and could be source of bruit, 0-39% bilateral    Past Surgical History  Procedure Date  . Cholecystectomy     ROS  Patient denies fever, chills, headache, sweats, rash, change in vision, change in hearing, chest pain, cough, nausea vomiting, urinary symptoms.  All of the systems are reviewed and are negative.  PHYSICAL EXAM Patient is oriented to person time and place.  Affect is normal.  Head is atraumatic.  No jugular venous distention.  Lungs are clear.  Respiratory effort is nonlabored.  Cardiac exam reveals muscle and S2.  There are no clicks or significant murmurs.  The abdomen is soft.  The patient has 1+ edema in the right leg.   There is trace edema in the left leg.  Patient has an abnormality of one of the toes of her right foot that is stable.  There are no skin rashes. Filed Vitals:   08/19/11 0946  BP: 119/76  Pulse: 73  Height: 5\' 2"  (1.575 m)  Weight: 168 lb (76.204 kg)    EKG is done today and reviewed by me.  It is normal.  ASSESSMENT & PLAN

## 2011-08-19 NOTE — Assessment & Plan Note (Signed)
Her diabetes is being managed very carefully by Dr. Talmage Nap.

## 2011-08-19 NOTE — Patient Instructions (Signed)
Your physician wants you to follow-up in: 1 year  You will receive a reminder letter in the mail two months in advance. If you don't receive a letter, please call our office to schedule the follow-up appointment.  Your physician recommends that you continue on your current medications as directed. Please refer to the Current Medication list given to you today.  

## 2011-08-19 NOTE — Assessment & Plan Note (Signed)
The patient's rheumatoid arthritis is now stable.  She does have some shoulder discomfort but it is different from her original ischemic pain.

## 2011-08-19 NOTE — Assessment & Plan Note (Signed)
Coronary disease is stable.  She had an echo last year showing good LV function.  Because her LV function remains good and she is not having any symptoms I chosen not to push her exercise testing.  However it has been many years and we will consider this next year.

## 2011-08-19 NOTE — Assessment & Plan Note (Signed)
Unfortunately patient has statin intolerance.  No change in therapy.

## 2011-08-19 NOTE — Assessment & Plan Note (Signed)
The patient had a carotid Doppler in November, 2011.  There was no significant plaque.  No further workup is needed at this time.

## 2011-11-19 DIAGNOSIS — M069 Rheumatoid arthritis, unspecified: Secondary | ICD-10-CM | POA: Diagnosis not present

## 2011-12-28 DIAGNOSIS — M069 Rheumatoid arthritis, unspecified: Secondary | ICD-10-CM | POA: Diagnosis not present

## 2012-01-04 DIAGNOSIS — M159 Polyosteoarthritis, unspecified: Secondary | ICD-10-CM | POA: Diagnosis not present

## 2012-01-04 DIAGNOSIS — M069 Rheumatoid arthritis, unspecified: Secondary | ICD-10-CM | POA: Diagnosis not present

## 2012-01-04 DIAGNOSIS — M719 Bursopathy, unspecified: Secondary | ICD-10-CM | POA: Diagnosis not present

## 2012-01-04 DIAGNOSIS — M67919 Unspecified disorder of synovium and tendon, unspecified shoulder: Secondary | ICD-10-CM | POA: Diagnosis not present

## 2012-01-19 DIAGNOSIS — M25569 Pain in unspecified knee: Secondary | ICD-10-CM | POA: Diagnosis not present

## 2012-01-19 DIAGNOSIS — M069 Rheumatoid arthritis, unspecified: Secondary | ICD-10-CM | POA: Diagnosis not present

## 2012-01-19 DIAGNOSIS — M159 Polyosteoarthritis, unspecified: Secondary | ICD-10-CM | POA: Diagnosis not present

## 2012-01-19 DIAGNOSIS — M719 Bursopathy, unspecified: Secondary | ICD-10-CM | POA: Diagnosis not present

## 2012-01-19 DIAGNOSIS — M67919 Unspecified disorder of synovium and tendon, unspecified shoulder: Secondary | ICD-10-CM | POA: Diagnosis not present

## 2012-02-08 DIAGNOSIS — M069 Rheumatoid arthritis, unspecified: Secondary | ICD-10-CM | POA: Diagnosis not present

## 2012-02-09 DIAGNOSIS — E7800 Pure hypercholesterolemia, unspecified: Secondary | ICD-10-CM | POA: Diagnosis not present

## 2012-02-09 DIAGNOSIS — I10 Essential (primary) hypertension: Secondary | ICD-10-CM | POA: Diagnosis not present

## 2012-02-29 DIAGNOSIS — E7800 Pure hypercholesterolemia, unspecified: Secondary | ICD-10-CM | POA: Diagnosis not present

## 2012-02-29 DIAGNOSIS — I2510 Atherosclerotic heart disease of native coronary artery without angina pectoris: Secondary | ICD-10-CM | POA: Diagnosis not present

## 2012-02-29 DIAGNOSIS — I10 Essential (primary) hypertension: Secondary | ICD-10-CM | POA: Diagnosis not present

## 2012-03-21 DIAGNOSIS — I10 Essential (primary) hypertension: Secondary | ICD-10-CM | POA: Diagnosis not present

## 2012-03-21 DIAGNOSIS — M069 Rheumatoid arthritis, unspecified: Secondary | ICD-10-CM | POA: Diagnosis not present

## 2012-04-18 DIAGNOSIS — E2839 Other primary ovarian failure: Secondary | ICD-10-CM | POA: Diagnosis not present

## 2012-04-18 DIAGNOSIS — Z0000 Encounter for general adult medical examination without abnormal findings: Secondary | ICD-10-CM | POA: Diagnosis not present

## 2012-04-25 DIAGNOSIS — M810 Age-related osteoporosis without current pathological fracture: Secondary | ICD-10-CM | POA: Diagnosis not present

## 2012-04-25 DIAGNOSIS — E7800 Pure hypercholesterolemia, unspecified: Secondary | ICD-10-CM | POA: Diagnosis not present

## 2012-04-25 DIAGNOSIS — E1129 Type 2 diabetes mellitus with other diabetic kidney complication: Secondary | ICD-10-CM | POA: Diagnosis not present

## 2012-04-25 DIAGNOSIS — I10 Essential (primary) hypertension: Secondary | ICD-10-CM | POA: Diagnosis not present

## 2012-04-25 DIAGNOSIS — E1165 Type 2 diabetes mellitus with hyperglycemia: Secondary | ICD-10-CM | POA: Diagnosis not present

## 2012-04-25 DIAGNOSIS — I2510 Atherosclerotic heart disease of native coronary artery without angina pectoris: Secondary | ICD-10-CM | POA: Diagnosis not present

## 2012-05-03 DIAGNOSIS — M67919 Unspecified disorder of synovium and tendon, unspecified shoulder: Secondary | ICD-10-CM | POA: Diagnosis not present

## 2012-05-03 DIAGNOSIS — M159 Polyosteoarthritis, unspecified: Secondary | ICD-10-CM | POA: Diagnosis not present

## 2012-05-03 DIAGNOSIS — M069 Rheumatoid arthritis, unspecified: Secondary | ICD-10-CM | POA: Diagnosis not present

## 2012-05-05 DIAGNOSIS — M069 Rheumatoid arthritis, unspecified: Secondary | ICD-10-CM | POA: Diagnosis not present

## 2012-05-13 DIAGNOSIS — M069 Rheumatoid arthritis, unspecified: Secondary | ICD-10-CM | POA: Diagnosis not present

## 2012-05-13 DIAGNOSIS — M25569 Pain in unspecified knee: Secondary | ICD-10-CM | POA: Diagnosis not present

## 2012-05-13 DIAGNOSIS — M159 Polyosteoarthritis, unspecified: Secondary | ICD-10-CM | POA: Diagnosis not present

## 2012-06-14 DIAGNOSIS — M069 Rheumatoid arthritis, unspecified: Secondary | ICD-10-CM | POA: Diagnosis not present

## 2012-07-26 DIAGNOSIS — E1129 Type 2 diabetes mellitus with other diabetic kidney complication: Secondary | ICD-10-CM | POA: Diagnosis not present

## 2012-07-26 DIAGNOSIS — M069 Rheumatoid arthritis, unspecified: Secondary | ICD-10-CM | POA: Diagnosis not present

## 2012-07-26 DIAGNOSIS — M810 Age-related osteoporosis without current pathological fracture: Secondary | ICD-10-CM | POA: Diagnosis not present

## 2012-07-26 DIAGNOSIS — E7800 Pure hypercholesterolemia, unspecified: Secondary | ICD-10-CM | POA: Diagnosis not present

## 2012-08-02 DIAGNOSIS — E7800 Pure hypercholesterolemia, unspecified: Secondary | ICD-10-CM | POA: Diagnosis not present

## 2012-08-02 DIAGNOSIS — I10 Essential (primary) hypertension: Secondary | ICD-10-CM | POA: Diagnosis not present

## 2012-08-02 DIAGNOSIS — I2510 Atherosclerotic heart disease of native coronary artery without angina pectoris: Secondary | ICD-10-CM | POA: Diagnosis not present

## 2012-08-02 DIAGNOSIS — E1129 Type 2 diabetes mellitus with other diabetic kidney complication: Secondary | ICD-10-CM | POA: Diagnosis not present

## 2012-08-09 DIAGNOSIS — M25529 Pain in unspecified elbow: Secondary | ICD-10-CM | POA: Diagnosis not present

## 2012-08-09 DIAGNOSIS — M159 Polyosteoarthritis, unspecified: Secondary | ICD-10-CM | POA: Diagnosis not present

## 2012-08-09 DIAGNOSIS — M069 Rheumatoid arthritis, unspecified: Secondary | ICD-10-CM | POA: Diagnosis not present

## 2012-08-12 DIAGNOSIS — I10 Essential (primary) hypertension: Secondary | ICD-10-CM | POA: Diagnosis not present

## 2012-08-12 DIAGNOSIS — E1129 Type 2 diabetes mellitus with other diabetic kidney complication: Secondary | ICD-10-CM | POA: Diagnosis not present

## 2012-08-12 DIAGNOSIS — E7800 Pure hypercholesterolemia, unspecified: Secondary | ICD-10-CM | POA: Diagnosis not present

## 2012-09-06 DIAGNOSIS — M069 Rheumatoid arthritis, unspecified: Secondary | ICD-10-CM | POA: Diagnosis not present

## 2012-10-18 DIAGNOSIS — M069 Rheumatoid arthritis, unspecified: Secondary | ICD-10-CM | POA: Diagnosis not present

## 2012-11-01 DIAGNOSIS — M810 Age-related osteoporosis without current pathological fracture: Secondary | ICD-10-CM | POA: Diagnosis not present

## 2012-11-01 DIAGNOSIS — I10 Essential (primary) hypertension: Secondary | ICD-10-CM | POA: Diagnosis not present

## 2012-11-01 DIAGNOSIS — E7800 Pure hypercholesterolemia, unspecified: Secondary | ICD-10-CM | POA: Diagnosis not present

## 2012-11-01 DIAGNOSIS — E1129 Type 2 diabetes mellitus with other diabetic kidney complication: Secondary | ICD-10-CM | POA: Diagnosis not present

## 2012-11-03 ENCOUNTER — Ambulatory Visit
Admission: RE | Admit: 2012-11-03 | Discharge: 2012-11-03 | Disposition: A | Discharge status: Home / Self Care | Payer: Medicare Other | Source: Ambulatory Visit | Attending: Internal Medicine | Admitting: Internal Medicine

## 2012-11-03 ENCOUNTER — Other Ambulatory Visit: Payer: Self-pay | Admitting: Internal Medicine

## 2012-11-03 DIAGNOSIS — I10 Essential (primary) hypertension: Secondary | ICD-10-CM | POA: Diagnosis not present

## 2012-11-03 DIAGNOSIS — R609 Edema, unspecified: Secondary | ICD-10-CM

## 2012-11-03 DIAGNOSIS — E1165 Type 2 diabetes mellitus with hyperglycemia: Secondary | ICD-10-CM | POA: Diagnosis not present

## 2012-11-03 DIAGNOSIS — E7800 Pure hypercholesterolemia, unspecified: Secondary | ICD-10-CM | POA: Diagnosis not present

## 2012-11-03 DIAGNOSIS — R52 Pain, unspecified: Secondary | ICD-10-CM

## 2012-11-03 DIAGNOSIS — M79609 Pain in unspecified limb: Secondary | ICD-10-CM | POA: Diagnosis not present

## 2012-11-03 DIAGNOSIS — N182 Chronic kidney disease, stage 2 (mild): Secondary | ICD-10-CM | POA: Diagnosis not present

## 2012-11-03 DIAGNOSIS — Z23 Encounter for immunization: Secondary | ICD-10-CM | POA: Diagnosis not present

## 2012-11-04 ENCOUNTER — Other Ambulatory Visit: Payer: Self-pay

## 2012-11-04 MED ORDER — POTASSIUM CHLORIDE ER 10 MEQ PO CPCR: 10.0000 meq | ORAL_CAPSULE | Freq: Every day | ORAL | Status: DC

## 2012-11-29 DIAGNOSIS — M069 Rheumatoid arthritis, unspecified: Secondary | ICD-10-CM | POA: Diagnosis not present

## 2012-12-05 DIAGNOSIS — M159 Polyosteoarthritis, unspecified: Secondary | ICD-10-CM | POA: Diagnosis not present

## 2012-12-05 DIAGNOSIS — M069 Rheumatoid arthritis, unspecified: Secondary | ICD-10-CM | POA: Diagnosis not present

## 2012-12-13 DIAGNOSIS — I10 Essential (primary) hypertension: Secondary | ICD-10-CM | POA: Diagnosis not present

## 2013-01-10 DIAGNOSIS — M069 Rheumatoid arthritis, unspecified: Secondary | ICD-10-CM | POA: Diagnosis not present

## 2013-02-06 DIAGNOSIS — R609 Edema, unspecified: Secondary | ICD-10-CM | POA: Diagnosis not present

## 2013-02-06 DIAGNOSIS — N182 Chronic kidney disease, stage 2 (mild): Secondary | ICD-10-CM | POA: Diagnosis not present

## 2013-02-06 DIAGNOSIS — I10 Essential (primary) hypertension: Secondary | ICD-10-CM | POA: Diagnosis not present

## 2013-02-06 DIAGNOSIS — I2510 Atherosclerotic heart disease of native coronary artery without angina pectoris: Secondary | ICD-10-CM | POA: Diagnosis not present

## 2013-02-08 DIAGNOSIS — M069 Rheumatoid arthritis, unspecified: Secondary | ICD-10-CM | POA: Diagnosis not present

## 2013-02-08 DIAGNOSIS — R609 Edema, unspecified: Secondary | ICD-10-CM | POA: Diagnosis not present

## 2013-02-14 DIAGNOSIS — R21 Rash and other nonspecific skin eruption: Secondary | ICD-10-CM | POA: Diagnosis not present

## 2013-02-14 DIAGNOSIS — I10 Essential (primary) hypertension: Secondary | ICD-10-CM | POA: Diagnosis not present

## 2013-02-14 DIAGNOSIS — E1129 Type 2 diabetes mellitus with other diabetic kidney complication: Secondary | ICD-10-CM | POA: Diagnosis not present

## 2013-02-14 DIAGNOSIS — E7800 Pure hypercholesterolemia, unspecified: Secondary | ICD-10-CM | POA: Diagnosis not present

## 2013-02-15 DIAGNOSIS — R609 Edema, unspecified: Secondary | ICD-10-CM | POA: Diagnosis not present

## 2013-02-15 DIAGNOSIS — I872 Venous insufficiency (chronic) (peripheral): Secondary | ICD-10-CM | POA: Diagnosis not present

## 2013-03-01 DIAGNOSIS — E1129 Type 2 diabetes mellitus with other diabetic kidney complication: Secondary | ICD-10-CM | POA: Diagnosis not present

## 2013-03-01 DIAGNOSIS — R609 Edema, unspecified: Secondary | ICD-10-CM | POA: Diagnosis not present

## 2013-03-07 DIAGNOSIS — M069 Rheumatoid arthritis, unspecified: Secondary | ICD-10-CM | POA: Diagnosis not present

## 2013-04-18 DIAGNOSIS — M069 Rheumatoid arthritis, unspecified: Secondary | ICD-10-CM | POA: Diagnosis not present

## 2013-04-19 DIAGNOSIS — M069 Rheumatoid arthritis, unspecified: Secondary | ICD-10-CM | POA: Diagnosis not present

## 2013-04-19 DIAGNOSIS — M159 Polyosteoarthritis, unspecified: Secondary | ICD-10-CM | POA: Diagnosis not present

## 2013-04-19 DIAGNOSIS — R21 Rash and other nonspecific skin eruption: Secondary | ICD-10-CM | POA: Diagnosis not present

## 2013-05-01 DIAGNOSIS — E1129 Type 2 diabetes mellitus with other diabetic kidney complication: Secondary | ICD-10-CM | POA: Diagnosis not present

## 2013-05-01 DIAGNOSIS — E7800 Pure hypercholesterolemia, unspecified: Secondary | ICD-10-CM | POA: Diagnosis not present

## 2013-05-01 DIAGNOSIS — Z23 Encounter for immunization: Secondary | ICD-10-CM | POA: Diagnosis not present

## 2013-05-01 DIAGNOSIS — Z1331 Encounter for screening for depression: Secondary | ICD-10-CM | POA: Diagnosis not present

## 2013-05-01 DIAGNOSIS — I10 Essential (primary) hypertension: Secondary | ICD-10-CM | POA: Diagnosis not present

## 2013-05-01 DIAGNOSIS — Z0000 Encounter for general adult medical examination without abnormal findings: Secondary | ICD-10-CM | POA: Diagnosis not present

## 2013-05-18 DIAGNOSIS — E7800 Pure hypercholesterolemia, unspecified: Secondary | ICD-10-CM | POA: Diagnosis not present

## 2013-05-18 DIAGNOSIS — I2510 Atherosclerotic heart disease of native coronary artery without angina pectoris: Secondary | ICD-10-CM | POA: Diagnosis not present

## 2013-05-18 DIAGNOSIS — E1129 Type 2 diabetes mellitus with other diabetic kidney complication: Secondary | ICD-10-CM | POA: Diagnosis not present

## 2013-05-18 DIAGNOSIS — I10 Essential (primary) hypertension: Secondary | ICD-10-CM | POA: Diagnosis not present

## 2013-05-29 DIAGNOSIS — M069 Rheumatoid arthritis, unspecified: Secondary | ICD-10-CM | POA: Diagnosis not present

## 2013-06-13 DIAGNOSIS — N182 Chronic kidney disease, stage 2 (mild): Secondary | ICD-10-CM | POA: Diagnosis not present

## 2013-06-13 DIAGNOSIS — I10 Essential (primary) hypertension: Secondary | ICD-10-CM | POA: Diagnosis not present

## 2013-06-13 DIAGNOSIS — E1129 Type 2 diabetes mellitus with other diabetic kidney complication: Secondary | ICD-10-CM | POA: Diagnosis not present

## 2013-06-13 DIAGNOSIS — E7800 Pure hypercholesterolemia, unspecified: Secondary | ICD-10-CM | POA: Diagnosis not present

## 2013-07-10 DIAGNOSIS — M069 Rheumatoid arthritis, unspecified: Secondary | ICD-10-CM | POA: Diagnosis not present

## 2013-07-26 ENCOUNTER — Ambulatory Visit (INDEPENDENT_AMBULATORY_CARE_PROVIDER_SITE_OTHER): Payer: Medicare Other | Admitting: Cardiology

## 2013-07-26 ENCOUNTER — Encounter: Payer: Self-pay | Admitting: Cardiology

## 2013-07-26 VITALS — BP 132/82 | HR 84 | Ht 62.0 in | Wt 165.0 lb

## 2013-07-26 DIAGNOSIS — Z888 Allergy status to other drugs, medicaments and biological substances status: Secondary | ICD-10-CM | POA: Diagnosis not present

## 2013-07-26 DIAGNOSIS — Z789 Other specified health status: Secondary | ICD-10-CM

## 2013-07-26 DIAGNOSIS — I2510 Atherosclerotic heart disease of native coronary artery without angina pectoris: Secondary | ICD-10-CM

## 2013-07-26 DIAGNOSIS — I779 Disorder of arteries and arterioles, unspecified: Secondary | ICD-10-CM

## 2013-07-26 DIAGNOSIS — M069 Rheumatoid arthritis, unspecified: Secondary | ICD-10-CM

## 2013-07-26 MED ORDER — ASPIRIN 81 MG PO TABS: 81.0000 mg | ORAL_TABLET | Freq: Every day | ORAL | Status: AC

## 2013-07-26 NOTE — Patient Instructions (Signed)
Your physician has requested that you have a lexiscan myoview. For further information please visit [email or web address] Please follow instruction sheet, as given. We will contact you with results of test.  Your physician has recommended you make the following change in your medication: decrease Aspirin to 81 mg daily.  Your physician wants you to follow-up in: 1 year. You will receive a reminder letter in the mail two months in advance. If you don't receive a letter, please call our office to schedule the follow-up appointment.

## 2013-07-26 NOTE — Assessment & Plan Note (Signed)
Unfortunately she is statin intolerant. 

## 2013-07-26 NOTE — Addendum Note (Signed)
Addended by: Demetrios Loll on: 07/26/2013 04:05 PM   Modules accepted: Orders

## 2013-07-26 NOTE — Assessment & Plan Note (Signed)
The patient has severe rheumatoid arthritis. Fortunately the symptoms are controlled with her medications.

## 2013-07-26 NOTE — Progress Notes (Signed)
HPI  The patient is seen to followup history of coronary disease. I saw her last October, 2012. Fortunately she is remains stable. She does have a history of coronary disease with catheterization in 2004. She had moderate disease but not optimal for PCI. She had a followup nuclear exercise test in 2005. She had no ischemia at that time. Head: 2011 revealed an ejection fraction of 60% with no significant wall motion abnormalities. In 2011 she had a severe flare of her rheumatoid arthritis. She is now on very potent medicines for this but fortunately she is stable.  As part of today's evaluation I have carefully reviewed her older records to completely up-to-date the current record.  Allergies  Allergen Reactions  . Hydrocodone     REACTION: nausea  . Statins     REACTION: joints    Current Outpatient Prescriptions  Medication Sig Dispense Refill  . acetaminophen (TYLENOL) 500 MG tablet Take 500 mg by mouth every 6 (six) hours as needed.        [name] alendronate (FOSAMAX) 70 MG tablet Take 70 mg by mouth every 7 (seven) days. Take with a full glass of water on an empty stomach.      [name] aspirin 325 MG tablet Take 325 mg by mouth daily.        . Calcium Carbonate-Vit D-Min (CALCIUM 600+D PLUS MINERALS) 600-400 MG-UNIT TABS Take by mouth.      . diphenhydrAMINE (BENADRYL ALLERGY) 25 mg capsule Take three days prior to remicaded infushion       . folic acid (FOLVITE) 1 MG tablet Take 1 mg by mouth daily.      . furosemide (LASIX) 20 MG tablet Take 20 mg by mouth 3 (three) times daily.       [name] inFLIXimab (REMICADE) 100 MG injection Inject into the vein. Every 6 weeks       . insulin NPH-insulin regular (NOVOLIN 70/30) (70-30) 100 UNIT/ML injection Inject into the skin.        [name] leflunomide (ARAVA) 20 MG tablet Take 20 mg by mouth daily.      . methotrexate (RHEUMATREX) 2.5 MG tablet Take 2.5 mg by mouth once a week. Caution:Chemotherapy. Protect from light.. 7 tablets every saturday       .  Multiple Vitamin (MULTIVITAMIN) tablet Take 1 tablet by mouth daily.        . potassium chloride (MICRO-K) 10 MEQ CR capsule Take 1 capsule (10 mEq total) by mouth daily.  30 capsule  4  . ranitidine (ZANTAC) 150 MG tablet 3 days prior to remicade  infushion        No current facility-administered medications for this visit.    History   Social History  . Marital Status: Married    Spouse Name: N/A    Number of Children: N/A  . Years of Education: N/A   Occupational History  . Not on file.   Social History Main Topics  . Smoking status: Former Smoker    Quit date: 11/16/1994  . Smokeless tobacco: Not on file  . Alcohol Use: Not on file  . Drug Use: Not on file  . Sexual Activity: Not on file   Other Topics Concern  . Not on file   Social History Narrative  . No narrative on file    No family history on file.  Past Medical History  Diagnosis Date  . CAD (coronary artery disease)     Catheterization 2004, 60% distal LAD, 80% ostial circumflex(  not optimal for PCI), 70% small RCA, medical therapy  /  nuclear June, 2005, EF 65%, no ischemia  . Edema   . Drug therapy     Intermittent steroid use  . Rheumatoid arthritis(714.0)     Hospitalization August, 2011, severe RA flare,   . Dyslipidemia   . Statin intolerance   . Ejection fraction     EF 60%, echo, November, 2011, trivial pericardial effusion  . Carotid artery disease     Doppler, November, 2011, no significant plaque, distal R. ICA velocities are elevated and could be source of bruit, 0-39% bilateral  . Diabetes mellitus     Past Surgical History  Procedure Laterality Date  . Cholecystectomy      Patient Active Problem List   Diagnosis Date Noted  . Diabetes mellitus   . Ejection fraction   . Carotid artery disease   . CAD (coronary artery disease)   . Drug therapy   . Rheumatoid arthritis   . Dyslipidemia   . Statin intolerance     ROS   Patient denies fever, chills, headache, sweats, rash,  change in vision, change in hearing, chest pain, cough, nausea vomiting, urinary symptoms. All other systems are reviewed and are negative. Her rheumatoid arthritis fortunately is controlled with a very potent medications that she receives.    PHYSICAL EXAM   Patient is oriented to person time and place. Affect is normal. There is no jugulovenous distention. Lungs are clear. Respiratory effort is nonlabored. Cardiac exam reveals S1 and S2. There no clicks or significant murmurs. The abdomen is soft. There is no peripheral edema. She does not have any skin rashes. She does not have marked bony changes from her rheumatoid arthritis.  Filed Vitals:   07/26/13 1459  BP: 132/82  Pulse: 84  Height: 5\' 2"  (1.575 m)  Weight: 165 lb (74.844 kg)     EKG is done today and reviewed by me. There is sinus rhythm. There is no significant change from the past.  ASSESSMENT & PLAN

## 2013-07-26 NOTE — Assessment & Plan Note (Signed)
Patient does have carotid disease but it is mild. We will plan followup in a later date.

## 2013-07-26 NOTE — Assessment & Plan Note (Signed)
The patient has known moderate coronary disease from 2004. Her last exercise test was 2005. It is time to schedule her for a pharmacologic stress study. I will be in touch with her with the results.

## 2013-08-07 ENCOUNTER — Encounter: Payer: Self-pay | Admitting: Cardiology

## 2013-08-08 ENCOUNTER — Ambulatory Visit (HOSPITAL_COMMUNITY): Payer: Medicare Other | Attending: Cardiology | Admitting: Radiology

## 2013-08-08 VITALS — BP 134/81 | Ht 62.0 in | Wt 164.0 lb

## 2013-08-08 DIAGNOSIS — R002 Palpitations: Secondary | ICD-10-CM | POA: Diagnosis not present

## 2013-08-08 DIAGNOSIS — I6529 Occlusion and stenosis of unspecified carotid artery: Secondary | ICD-10-CM | POA: Insufficient documentation

## 2013-08-08 DIAGNOSIS — E119 Type 2 diabetes mellitus without complications: Secondary | ICD-10-CM | POA: Diagnosis not present

## 2013-08-08 DIAGNOSIS — R0602 Shortness of breath: Secondary | ICD-10-CM

## 2013-08-08 DIAGNOSIS — I252 Old myocardial infarction: Secondary | ICD-10-CM | POA: Insufficient documentation

## 2013-08-08 DIAGNOSIS — I4949 Other premature depolarization: Secondary | ICD-10-CM | POA: Diagnosis not present

## 2013-08-08 DIAGNOSIS — Z87891 Personal history of nicotine dependence: Secondary | ICD-10-CM | POA: Insufficient documentation

## 2013-08-08 DIAGNOSIS — I2510 Atherosclerotic heart disease of native coronary artery without angina pectoris: Secondary | ICD-10-CM | POA: Diagnosis not present

## 2013-08-08 DIAGNOSIS — I10 Essential (primary) hypertension: Secondary | ICD-10-CM | POA: Diagnosis not present

## 2013-08-08 DIAGNOSIS — R0609 Other forms of dyspnea: Secondary | ICD-10-CM | POA: Diagnosis not present

## 2013-08-08 DIAGNOSIS — R0989 Other specified symptoms and signs involving the circulatory and respiratory systems: Secondary | ICD-10-CM | POA: Insufficient documentation

## 2013-08-08 MED ORDER — TECHNETIUM TC 99M SESTAMIBI GENERIC - CARDIOLITE
10.0000 | Freq: Once | INTRAVENOUS | Status: AC | PRN
  Administered 2013-08-08: 10 via INTRAVENOUS

## 2013-08-08 MED ORDER — REGADENOSON 0.4 MG/5ML IV SOLN
0.4000 mg | Freq: Once | INTRAVENOUS | Status: AC
  Administered 2013-08-08: 0.4 mg via INTRAVENOUS

## 2013-08-08 MED ORDER — TECHNETIUM TC 99M SESTAMIBI GENERIC - CARDIOLITE
30.0000 | Freq: Once | INTRAVENOUS | Status: AC | PRN
  Administered 2013-08-08: 30 via INTRAVENOUS

## 2013-08-08 NOTE — Progress Notes (Signed)
[facility] SITE 3 NUCLEAR MED [address] [place], [place] [address] [phone]    Cardiology Nuclear Med Study  [name] [name] is a 72 y.o. female     MRN : [id number]     DOB: [birth year]  Procedure Date: 08/08/2013  Nuclear Med Background Indication for Stress Test:  Evaluation for Ischemia History:  '97 MI-'04 Heart Cath: mod CAD not optimal for PCI, '05 MPS: (-) ischemia EF: 65%, '11 ECHO: EF: 60%  Cardiac Risk Factors: Carotid Disease, History of Smoking, Hypertension, Lipids and NIDDM  Symptoms:  DOE, Palpitations and SOB   Nuclear Pre-Procedure Caffeine/Decaff Intake:  None NPO After: 9:00pm   Lungs:  clear O2 Sat: 97% on room air. IV 0.9% NS with Angio Cath:  22g  IV Site: R Hand  IV Started by:  Doyne Keel, CNMT  Chest Size (in):  38 Cup Size: DD  Height: 5\' 2"  (1.575 m)  Weight:  164 lb (74.39 kg)  BMI:  Body mass index is 29.99 kg/(m^2). Tech Comments:  N/A    Nuclear Med Study 1 or 2 day study: 1 day  Stress Test Type:  Lexiscan  Reading MD: Tobias Alexander, MD  Order Authorizing Provider:  Willa Rough, MD  Resting Radionuclide: Technetium 4m Sestamibi  Resting Radionuclide Dose: 11.0 mCi   Stress Radionuclide:  Technetium 55m Sestamibi  Stress Radionuclide Dose: 33.0 mCi           Stress Protocol Rest HR: 71 Stress HR: 105  Rest BP: 134/81 Stress BP: 148/80  Exercise Time (min): n/a METS: n/a   Predicted Max HR: 148 bpm % Max HR: 70.95 bpm Rate Pressure Product: 53664   Dose of Adenosine (mg):  n/a Dose of Lexiscan: 0.4 mg  Dose of Atropine (mg): n/a Dose of Dobutamine: n/a mcg/kg/min (at max HR)  Stress Test Technologist: Milana Na, EMT-P  Nuclear Technologist:  Domenic Polite, CNMT     Rest Procedure:  Myocardial perfusion imaging was performed at rest 45 minutes following the intravenous administration of Technetium 69m Sestamibi. Rest ECG: NSR - Normal EKG  Stress Procedure:  The patient received IV Lexiscan 0.4 mg  over 15-seconds.  Technetium 62m Sestamibi injected at 30-seconds. This patient had sob, fatigue, and felt strange with the Lexiscan injection. Quantitative spect images were obtained after a 45 minute delay. Stress ECG: No significant change from baseline ECG  QPS Raw Data Images:  There is interference from nuclear activity from structures below the diaphragm. This does not affect the ability to read the study. Stress Images:  Normal homogeneous uptake in all areas of the myocardium. Rest Images:  Normal homogeneous uptake in all areas of the myocardium. Subtraction (SDS):  No evidence of ischemia. Transient Ischemic Dilatation (Normal <1.22):  n/a Lung/Heart Ratio (Normal <0.45):  0.49  Quantitative Gated Spect Images QGS EDV:  66 ml QGS ESV:  16 ml  Impression Exercise Capacity:  Lexiscan with no exercise. BP Response:  Normal blood pressure response. Clinical Symptoms:  There is dyspnea. ECG Impression:  No significant ST segment change suggestive of ischemia. Comparison with Prior Nuclear Study: No significant change from previous study  Overall Impression:  Normal stress nuclear study.  LV Ejection Fraction: 75%.  LV Wall Motion:  NL LV Function; NL Wall Motion   Tobias Alexander, Rexene Edison 08/08/2013

## 2013-08-11 ENCOUNTER — Encounter: Payer: Self-pay | Admitting: Cardiology

## 2013-08-22 DIAGNOSIS — E1129 Type 2 diabetes mellitus with other diabetic kidney complication: Secondary | ICD-10-CM | POA: Diagnosis not present

## 2013-08-22 DIAGNOSIS — M79609 Pain in unspecified limb: Secondary | ICD-10-CM | POA: Diagnosis not present

## 2013-08-22 DIAGNOSIS — M069 Rheumatoid arthritis, unspecified: Secondary | ICD-10-CM | POA: Diagnosis not present

## 2013-08-22 DIAGNOSIS — M159 Polyosteoarthritis, unspecified: Secondary | ICD-10-CM | POA: Diagnosis not present

## 2013-08-22 DIAGNOSIS — E7800 Pure hypercholesterolemia, unspecified: Secondary | ICD-10-CM | POA: Diagnosis not present

## 2013-08-29 DIAGNOSIS — E1129 Type 2 diabetes mellitus with other diabetic kidney complication: Secondary | ICD-10-CM | POA: Diagnosis not present

## 2013-08-29 DIAGNOSIS — N182 Chronic kidney disease, stage 2 (mild): Secondary | ICD-10-CM | POA: Diagnosis not present

## 2013-08-29 DIAGNOSIS — Z23 Encounter for immunization: Secondary | ICD-10-CM | POA: Diagnosis not present

## 2013-08-29 DIAGNOSIS — I10 Essential (primary) hypertension: Secondary | ICD-10-CM | POA: Diagnosis not present

## 2013-08-29 DIAGNOSIS — I2510 Atherosclerotic heart disease of native coronary artery without angina pectoris: Secondary | ICD-10-CM | POA: Diagnosis not present

## 2013-09-22 DIAGNOSIS — L259 Unspecified contact dermatitis, unspecified cause: Secondary | ICD-10-CM | POA: Diagnosis not present

## 2013-09-22 DIAGNOSIS — L219 Seborrheic dermatitis, unspecified: Secondary | ICD-10-CM | POA: Diagnosis not present

## 2013-10-03 DIAGNOSIS — M069 Rheumatoid arthritis, unspecified: Secondary | ICD-10-CM | POA: Diagnosis not present

## 2013-10-24 DIAGNOSIS — I872 Venous insufficiency (chronic) (peripheral): Secondary | ICD-10-CM | POA: Diagnosis not present

## 2013-11-15 DIAGNOSIS — M069 Rheumatoid arthritis, unspecified: Secondary | ICD-10-CM | POA: Diagnosis not present

## 2013-11-22 DIAGNOSIS — E1129 Type 2 diabetes mellitus with other diabetic kidney complication: Secondary | ICD-10-CM | POA: Diagnosis not present

## 2013-11-22 DIAGNOSIS — M159 Polyosteoarthritis, unspecified: Secondary | ICD-10-CM | POA: Diagnosis not present

## 2013-11-22 DIAGNOSIS — M069 Rheumatoid arthritis, unspecified: Secondary | ICD-10-CM | POA: Diagnosis not present

## 2013-11-22 DIAGNOSIS — I10 Essential (primary) hypertension: Secondary | ICD-10-CM | POA: Diagnosis not present

## 2013-11-22 DIAGNOSIS — M810 Age-related osteoporosis without current pathological fracture: Secondary | ICD-10-CM | POA: Diagnosis not present

## 2013-11-29 ENCOUNTER — Other Ambulatory Visit: Payer: Self-pay | Admitting: Cardiology

## 2013-11-29 DIAGNOSIS — I10 Essential (primary) hypertension: Secondary | ICD-10-CM | POA: Diagnosis not present

## 2013-11-29 DIAGNOSIS — E7800 Pure hypercholesterolemia, unspecified: Secondary | ICD-10-CM | POA: Diagnosis not present

## 2013-11-29 DIAGNOSIS — I2510 Atherosclerotic heart disease of native coronary artery without angina pectoris: Secondary | ICD-10-CM | POA: Diagnosis not present

## 2013-11-29 DIAGNOSIS — E1129 Type 2 diabetes mellitus with other diabetic kidney complication: Secondary | ICD-10-CM | POA: Diagnosis not present

## 2013-12-26 DIAGNOSIS — M069 Rheumatoid arthritis, unspecified: Secondary | ICD-10-CM | POA: Diagnosis not present

## 2014-01-19 ENCOUNTER — Other Ambulatory Visit: Payer: Self-pay

## 2014-01-19 ENCOUNTER — Other Ambulatory Visit: Payer: Self-pay | Admitting: Internal Medicine

## 2014-01-19 DIAGNOSIS — Z1231 Encounter for screening mammogram for malignant neoplasm of breast: Secondary | ICD-10-CM

## 2014-02-05 ENCOUNTER — Ambulatory Visit
Admission: RE | Admit: 2014-02-05 | Discharge: 2014-02-05 | Disposition: A | Discharge status: Home / Self Care | Payer: Medicare Other | Source: Ambulatory Visit | Attending: Internal Medicine | Admitting: Internal Medicine

## 2014-02-05 DIAGNOSIS — Z1231 Encounter for screening mammogram for malignant neoplasm of breast: Secondary | ICD-10-CM

## 2014-02-06 DIAGNOSIS — M069 Rheumatoid arthritis, unspecified: Secondary | ICD-10-CM | POA: Diagnosis not present

## 2014-02-08 ENCOUNTER — Other Ambulatory Visit: Payer: Self-pay | Admitting: Internal Medicine

## 2014-02-08 DIAGNOSIS — R928 Other abnormal and inconclusive findings on diagnostic imaging of breast: Secondary | ICD-10-CM

## 2014-02-20 ENCOUNTER — Ambulatory Visit
Admission: RE | Admit: 2014-02-20 | Discharge: 2014-02-20 | Disposition: A | Discharge status: Home / Self Care | Payer: Medicare Other | Source: Ambulatory Visit | Attending: Internal Medicine | Admitting: Internal Medicine

## 2014-02-20 DIAGNOSIS — R928 Other abnormal and inconclusive findings on diagnostic imaging of breast: Secondary | ICD-10-CM | POA: Diagnosis not present

## 2014-02-21 DIAGNOSIS — M069 Rheumatoid arthritis, unspecified: Secondary | ICD-10-CM | POA: Diagnosis not present

## 2014-02-21 DIAGNOSIS — M810 Age-related osteoporosis without current pathological fracture: Secondary | ICD-10-CM | POA: Diagnosis not present

## 2014-02-21 DIAGNOSIS — M159 Polyosteoarthritis, unspecified: Secondary | ICD-10-CM | POA: Diagnosis not present

## 2014-02-21 DIAGNOSIS — R928 Other abnormal and inconclusive findings on diagnostic imaging of breast: Secondary | ICD-10-CM | POA: Diagnosis not present

## 2014-02-23 ENCOUNTER — Other Ambulatory Visit: Payer: Self-pay | Admitting: Internal Medicine

## 2014-02-23 DIAGNOSIS — R921 Mammographic calcification found on diagnostic imaging of breast: Secondary | ICD-10-CM

## 2014-03-20 DIAGNOSIS — M069 Rheumatoid arthritis, unspecified: Secondary | ICD-10-CM | POA: Diagnosis not present

## 2014-04-24 DIAGNOSIS — M810 Age-related osteoporosis without current pathological fracture: Secondary | ICD-10-CM | POA: Diagnosis not present

## 2014-04-24 DIAGNOSIS — R928 Other abnormal and inconclusive findings on diagnostic imaging of breast: Secondary | ICD-10-CM | POA: Diagnosis not present

## 2014-04-24 DIAGNOSIS — M159 Polyosteoarthritis, unspecified: Secondary | ICD-10-CM | POA: Diagnosis not present

## 2014-04-24 DIAGNOSIS — M069 Rheumatoid arthritis, unspecified: Secondary | ICD-10-CM | POA: Diagnosis not present

## 2014-05-01 DIAGNOSIS — M069 Rheumatoid arthritis, unspecified: Secondary | ICD-10-CM | POA: Diagnosis not present

## 2014-05-29 DIAGNOSIS — Z0000 Encounter for general adult medical examination without abnormal findings: Secondary | ICD-10-CM | POA: Diagnosis not present

## 2014-05-29 DIAGNOSIS — Z1331 Encounter for screening for depression: Secondary | ICD-10-CM | POA: Diagnosis not present

## 2014-05-29 DIAGNOSIS — I10 Essential (primary) hypertension: Secondary | ICD-10-CM | POA: Diagnosis not present

## 2014-05-29 DIAGNOSIS — M810 Age-related osteoporosis without current pathological fracture: Secondary | ICD-10-CM | POA: Diagnosis not present

## 2014-05-29 DIAGNOSIS — E1129 Type 2 diabetes mellitus with other diabetic kidney complication: Secondary | ICD-10-CM | POA: Diagnosis not present

## 2014-06-03 ENCOUNTER — Emergency Department (HOSPITAL_COMMUNITY): Payer: No Typology Code available for payment source

## 2014-06-03 ENCOUNTER — Encounter (HOSPITAL_COMMUNITY): Payer: Self-pay | Admitting: Emergency Medicine

## 2014-06-03 ENCOUNTER — Inpatient Hospital Stay (HOSPITAL_COMMUNITY)
Admission: EM | Admit: 2014-06-03 | Discharge: 2014-06-06 | DRG: 552 | Disposition: A | Discharge status: Home / Self Care | Payer: No Typology Code available for payment source | Attending: General Surgery | Admitting: General Surgery

## 2014-06-03 DIAGNOSIS — S4980XA Other specified injuries of shoulder and upper arm, unspecified arm, initial encounter: Secondary | ICD-10-CM | POA: Diagnosis not present

## 2014-06-03 DIAGNOSIS — Z794 Long term (current) use of insulin: Secondary | ICD-10-CM

## 2014-06-03 DIAGNOSIS — S46909A Unspecified injury of unspecified muscle, fascia and tendon at shoulder and upper arm level, unspecified arm, initial encounter: Secondary | ICD-10-CM | POA: Diagnosis not present

## 2014-06-03 DIAGNOSIS — D62 Acute posthemorrhagic anemia: Secondary | ICD-10-CM | POA: Diagnosis not present

## 2014-06-03 DIAGNOSIS — R109 Unspecified abdominal pain: Secondary | ICD-10-CM | POA: Diagnosis present

## 2014-06-03 DIAGNOSIS — T1490XA Injury, unspecified, initial encounter: Secondary | ICD-10-CM | POA: Diagnosis not present

## 2014-06-03 DIAGNOSIS — S8990XA Unspecified injury of unspecified lower leg, initial encounter: Secondary | ICD-10-CM | POA: Diagnosis not present

## 2014-06-03 DIAGNOSIS — S0993XA Unspecified injury of face, initial encounter: Secondary | ICD-10-CM | POA: Diagnosis not present

## 2014-06-03 DIAGNOSIS — S199XXA Unspecified injury of neck, initial encounter: Secondary | ICD-10-CM | POA: Diagnosis not present

## 2014-06-03 DIAGNOSIS — IMO0002 Reserved for concepts with insufficient information to code with codable children: Secondary | ICD-10-CM | POA: Diagnosis present

## 2014-06-03 DIAGNOSIS — S298XXA Other specified injuries of thorax, initial encounter: Secondary | ICD-10-CM | POA: Diagnosis not present

## 2014-06-03 DIAGNOSIS — M79609 Pain in unspecified limb: Secondary | ICD-10-CM | POA: Diagnosis not present

## 2014-06-03 DIAGNOSIS — R112 Nausea with vomiting, unspecified: Secondary | ICD-10-CM | POA: Diagnosis present

## 2014-06-03 DIAGNOSIS — Y92410 Unspecified street and highway as the place of occurrence of the external cause: Secondary | ICD-10-CM

## 2014-06-03 DIAGNOSIS — S32009A Unspecified fracture of unspecified lumbar vertebra, initial encounter for closed fracture: Secondary | ICD-10-CM | POA: Diagnosis present

## 2014-06-03 DIAGNOSIS — Z7982 Long term (current) use of aspirin: Secondary | ICD-10-CM

## 2014-06-03 DIAGNOSIS — M25569 Pain in unspecified knee: Secondary | ICD-10-CM | POA: Diagnosis present

## 2014-06-03 DIAGNOSIS — M069 Rheumatoid arthritis, unspecified: Secondary | ICD-10-CM | POA: Diagnosis present

## 2014-06-03 DIAGNOSIS — S3981XA Other specified injuries of abdomen, initial encounter: Secondary | ICD-10-CM | POA: Diagnosis not present

## 2014-06-03 DIAGNOSIS — S301XXA Contusion of abdominal wall, initial encounter: Secondary | ICD-10-CM

## 2014-06-03 DIAGNOSIS — E119 Type 2 diabetes mellitus without complications: Secondary | ICD-10-CM | POA: Diagnosis present

## 2014-06-03 DIAGNOSIS — Z885 Allergy status to narcotic agent status: Secondary | ICD-10-CM

## 2014-06-03 DIAGNOSIS — Z7983 Long term (current) use of bisphosphonates: Secondary | ICD-10-CM

## 2014-06-03 DIAGNOSIS — Z87891 Personal history of nicotine dependence: Secondary | ICD-10-CM | POA: Diagnosis not present

## 2014-06-03 DIAGNOSIS — S59919A Unspecified injury of unspecified forearm, initial encounter: Secondary | ICD-10-CM | POA: Diagnosis not present

## 2014-06-03 DIAGNOSIS — S59909A Unspecified injury of unspecified elbow, initial encounter: Secondary | ICD-10-CM | POA: Diagnosis not present

## 2014-06-03 LAB — COMPREHENSIVE METABOLIC PANEL
ALT: 40 U/L — ABNORMAL HIGH (ref 0–35)
ANION GAP: 15 (ref 5–15)
AST: 67 U/L — ABNORMAL HIGH (ref 0–37)
Albumin: 3.6 g/dL (ref 3.5–5.2)
Alkaline Phosphatase: 85 U/L (ref 39–117)
BUN: 13 mg/dL (ref 6–23)
CALCIUM: 8.8 mg/dL (ref 8.4–10.5)
CO2: 23 mEq/L (ref 19–32)
Chloride: 100 mEq/L (ref 96–112)
Creatinine, Ser: 0.9 mg/dL (ref 0.50–1.10)
GFR, EST AFRICAN AMERICAN: 72 mL/min — AB (ref >90)
GFR, EST NON AFRICAN AMERICAN: 62 mL/min — AB (ref >90)
GLUCOSE: 253 mg/dL — AB (ref 70–99)
Potassium: 4.1 mEq/L (ref 3.7–5.3)
SODIUM: 138 meq/L (ref 137–147)
Total Bilirubin: 0.5 mg/dL (ref 0.3–1.2)
Total Protein: 7.5 g/dL (ref 6.0–8.3)

## 2014-06-03 LAB — CBC
HEMATOCRIT: 36.1 % (ref 36.0–46.0)
HEMOGLOBIN: 11.8 g/dL — AB (ref 12.0–15.0)
MCH: 29.8 pg (ref 26.0–34.0)
MCHC: 32.7 g/dL (ref 30.0–36.0)
MCV: 91.2 fL (ref 78.0–100.0)
Platelets: 190 10*3/uL (ref 150–400)
RBC: 3.96 MIL/uL (ref 3.87–5.11)
RDW: 14.8 % (ref 11.5–15.5)
WBC: 10.8 10*3/uL — ABNORMAL HIGH (ref 4.0–10.5)

## 2014-06-03 LAB — I-STAT CHEM 8, ED
BUN: 13 mg/dL (ref 6–23)
CHLORIDE: 103 meq/L (ref 96–112)
Calcium, Ion: 1.17 mmol/L (ref 1.13–1.30)
Creatinine, Ser: 1 mg/dL (ref 0.50–1.10)
Glucose, Bld: 265 mg/dL — ABNORMAL HIGH (ref 70–99)
HEMATOCRIT: 40 % (ref 36.0–46.0)
HEMOGLOBIN: 13.6 g/dL (ref 12.0–15.0)
POTASSIUM: 4 meq/L (ref 3.7–5.3)
SODIUM: 139 meq/L (ref 137–147)
TCO2: 22 mmol/L (ref 0–100)

## 2014-06-03 LAB — URINE MICROSCOPIC-ADD ON

## 2014-06-03 LAB — URINALYSIS, ROUTINE W REFLEX MICROSCOPIC
Bilirubin Urine: NEGATIVE
Glucose, UA: 250 mg/dL — AB
Ketones, ur: NEGATIVE mg/dL
LEUKOCYTES UA: NEGATIVE
NITRITE: NEGATIVE
PH: 6 (ref 5.0–8.0)
Protein, ur: NEGATIVE mg/dL
UROBILINOGEN UA: 1 mg/dL (ref 0.0–1.0)

## 2014-06-03 LAB — GLUCOSE, CAPILLARY: Glucose-Capillary: 181 mg/dL — ABNORMAL HIGH (ref 70–99)

## 2014-06-03 MED ORDER — FUROSEMIDE 20 MG PO TABS
20.0000 mg | ORAL_TABLET | Freq: Three times a day (TID) | ORAL | Status: DC
  Administered 2014-06-04 – 2014-06-06 (×2): 20 mg via ORAL
  Filled 2014-06-03 (×11): qty 1

## 2014-06-03 MED ORDER — POTASSIUM CHLORIDE IN NACL 20-0.9 MEQ/L-% IV SOLN
INTRAVENOUS | Status: DC
  Administered 2014-06-03 – 2014-06-04 (×2): via INTRAVENOUS
  Filled 2014-06-03 (×2): qty 1000

## 2014-06-03 MED ORDER — LEFLUNOMIDE 20 MG PO TABS
20.0000 mg | ORAL_TABLET | Freq: Every day | ORAL | Status: DC
  Administered 2014-06-04 – 2014-06-06 (×2): 20 mg via ORAL
  Filled 2014-06-03 (×4): qty 1

## 2014-06-03 MED ORDER — ONDANSETRON HCL 4 MG/2ML IJ SOLN
4.0000 mg | Freq: Once | INTRAMUSCULAR | Status: AC
  Administered 2014-06-03: 4 mg via INTRAVENOUS
  Filled 2014-06-03: qty 2

## 2014-06-03 MED ORDER — IOHEXOL 300 MG/ML  SOLN
100.0000 mL | Freq: Once | INTRAMUSCULAR | Status: AC | PRN
  Administered 2014-06-03: 100 mL via INTRAVENOUS

## 2014-06-03 MED ORDER — OXYCODONE HCL 5 MG PO TABS: 2.5000 mg | ORAL_TABLET | ORAL | Status: DC | PRN

## 2014-06-03 MED ORDER — PANTOPRAZOLE SODIUM 40 MG PO TBEC: 40.0000 mg | DELAYED_RELEASE_TABLET | Freq: Every day | ORAL | Status: DC

## 2014-06-03 MED ORDER — ENOXAPARIN SODIUM 40 MG/0.4ML ~~LOC~~ SOLN
40.0000 mg | SUBCUTANEOUS | Status: DC
  Administered 2014-06-04 – 2014-06-06 (×3): 40 mg via SUBCUTANEOUS
  Filled 2014-06-03 (×6): qty 0.4

## 2014-06-03 MED ORDER — MORPHINE SULFATE 2 MG/ML IJ SOLN
INTRAMUSCULAR | Status: AC
  Filled 2014-06-03: qty 1

## 2014-06-03 MED ORDER — POTASSIUM CHLORIDE CRYS ER 10 MEQ PO TBCR
10.0000 meq | EXTENDED_RELEASE_TABLET | Freq: Every day | ORAL | Status: DC
  Administered 2014-06-04 – 2014-06-06 (×3): 10 meq via ORAL
  Filled 2014-06-03 (×4): qty 1

## 2014-06-03 MED ORDER — FOLIC ACID 1 MG PO TABS
1.0000 mg | ORAL_TABLET | Freq: Every day | ORAL | Status: DC
  Administered 2014-06-04 – 2014-06-06 (×3): 1 mg via ORAL
  Filled 2014-06-03 (×4): qty 1

## 2014-06-03 MED ORDER — MORPHINE SULFATE 2 MG/ML IJ SOLN
1.0000 mg | INTRAMUSCULAR | Status: DC | PRN
  Administered 2014-06-03: 2 mg via INTRAVENOUS

## 2014-06-03 MED ORDER — DOCUSATE SODIUM 100 MG PO CAPS
100.0000 mg | ORAL_CAPSULE | Freq: Two times a day (BID) | ORAL | Status: DC
  Administered 2014-06-04 – 2014-06-06 (×5): 100 mg via ORAL
  Filled 2014-06-03 (×4): qty 1

## 2014-06-03 MED ORDER — FENTANYL CITRATE 0.05 MG/ML IJ SOLN
50.0000 ug | Freq: Once | INTRAMUSCULAR | Status: AC
  Administered 2014-06-03: 50 ug via INTRAVENOUS
  Filled 2014-06-03: qty 2

## 2014-06-03 MED ORDER — ONDANSETRON HCL 4 MG/2ML IJ SOLN
4.0000 mg | Freq: Four times a day (QID) | INTRAMUSCULAR | Status: DC | PRN
  Administered 2014-06-04 – 2014-06-06 (×2): 4 mg via INTRAVENOUS
  Filled 2014-06-03 (×3): qty 2

## 2014-06-03 MED ORDER — LOSARTAN POTASSIUM 25 MG PO TABS
25.0000 mg | ORAL_TABLET | Freq: Every day | ORAL | Status: DC
  Administered 2014-06-04 – 2014-06-06 (×3): 25 mg via ORAL
  Filled 2014-06-03 (×4): qty 1

## 2014-06-03 MED ORDER — PANTOPRAZOLE SODIUM 40 MG IV SOLR
40.0000 mg | Freq: Every day | INTRAVENOUS | Status: DC
  Filled 2014-06-03: qty 40

## 2014-06-03 MED ORDER — OXYCODONE HCL 5 MG PO TABS
5.0000 mg | ORAL_TABLET | ORAL | Status: DC | PRN
  Administered 2014-06-04 (×2): 5 mg via ORAL
  Filled 2014-06-03 (×2): qty 1

## 2014-06-03 MED ORDER — ONDANSETRON HCL 4 MG PO TABS: 4.0000 mg | ORAL_TABLET | Freq: Four times a day (QID) | ORAL | Status: DC | PRN
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
## 2014-06-03 NOTE — ED Notes (Signed)
Patient transported to X-ray 
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
## 2014-06-03 NOTE — ED Notes (Signed)
Patient still in Radiology at this time

## 2014-06-03 NOTE — ED Notes (Signed)
Patient couldn't ambulate after getting up off the bed. She was having severe lower back pain and pains in her lower pelvic area

## 2014-06-03 NOTE — ED Notes (Signed)
Dr Wilson at bedside.

## 2014-06-04 ENCOUNTER — Encounter (HOSPITAL_COMMUNITY): Payer: Self-pay | Admitting: Surgery

## 2014-06-04 ENCOUNTER — Observation Stay (HOSPITAL_COMMUNITY): Payer: No Typology Code available for payment source

## 2014-06-04 DIAGNOSIS — S32009A Unspecified fracture of unspecified lumbar vertebra, initial encounter for closed fracture: Secondary | ICD-10-CM

## 2014-06-04 DIAGNOSIS — D62 Acute posthemorrhagic anemia: Secondary | ICD-10-CM | POA: Diagnosis not present

## 2014-06-04 DIAGNOSIS — R109 Unspecified abdominal pain: Secondary | ICD-10-CM | POA: Diagnosis present

## 2014-06-04 DIAGNOSIS — S0993XA Unspecified injury of face, initial encounter: Secondary | ICD-10-CM | POA: Diagnosis not present

## 2014-06-04 LAB — CBC
HEMATOCRIT: 32.1 % — AB (ref 36.0–46.0)
HEMOGLOBIN: 10.4 g/dL — AB (ref 12.0–15.0)
MCH: 30 pg (ref 26.0–34.0)
MCHC: 32.4 g/dL (ref 30.0–36.0)
MCV: 92.5 fL (ref 78.0–100.0)
Platelets: 159 10*3/uL (ref 150–400)
RBC: 3.47 MIL/uL — AB (ref 3.87–5.11)
RDW: 14.8 % (ref 11.5–15.5)
WBC: 5.9 10*3/uL (ref 4.0–10.5)

## 2014-06-04 LAB — COMPREHENSIVE METABOLIC PANEL
ALBUMIN: 3.2 g/dL — AB (ref 3.5–5.2)
ALK PHOS: 73 U/L (ref 39–117)
ALT: 37 U/L — ABNORMAL HIGH (ref 0–35)
ANION GAP: 14 (ref 5–15)
AST: 56 U/L — ABNORMAL HIGH (ref 0–37)
BUN: 12 mg/dL (ref 6–23)
CALCIUM: 8.4 mg/dL (ref 8.4–10.5)
CO2: 21 mEq/L (ref 19–32)
Chloride: 103 mEq/L (ref 96–112)
Creatinine, Ser: 0.84 mg/dL (ref 0.50–1.10)
GFR calc Af Amer: 78 mL/min — ABNORMAL LOW (ref >90)
GFR calc non Af Amer: 67 mL/min — ABNORMAL LOW (ref >90)
Glucose, Bld: 204 mg/dL — ABNORMAL HIGH (ref 70–99)
POTASSIUM: 4.5 meq/L (ref 3.7–5.3)
Sodium: 138 mEq/L (ref 137–147)
Total Bilirubin: 0.6 mg/dL (ref 0.3–1.2)
Total Protein: 6.8 g/dL (ref 6.0–8.3)

## 2014-06-04 MED ORDER — TRAMADOL HCL 50 MG PO TABS
50.0000 mg | ORAL_TABLET | Freq: Four times a day (QID) | ORAL | Status: DC | PRN
  Administered 2014-06-04: 50 mg via ORAL
  Administered 2014-06-04 – 2014-06-05 (×3): 100 mg via ORAL
  Administered 2014-06-05: 50 mg via ORAL
  Filled 2014-06-04: qty 2
  Filled 2014-06-04: qty 1
  Filled 2014-06-04: qty 2
  Filled 2014-06-04: qty 1
  Filled 2014-06-04: qty 2
  Filled 2014-06-04: qty 1

## 2014-06-04 MED ORDER — GLIPIZIDE 2.5 MG HALF TABLET
2.5000 mg | ORAL_TABLET | Freq: Every day | ORAL | Status: DC
  Administered 2014-06-05 – 2014-06-06 (×2): 2.5 mg via ORAL
  Filled 2014-06-04 (×4): qty 1

## 2014-06-04 MED ORDER — NAPROXEN 500 MG PO TABS
500.0000 mg | ORAL_TABLET | Freq: Two times a day (BID) | ORAL | Status: DC
  Administered 2014-06-04 – 2014-06-06 (×5): 500 mg via ORAL
  Filled 2014-06-04: qty 1
  Filled 2014-06-04 (×2): qty 2
  Filled 2014-06-04: qty 1
  Filled 2014-06-04: qty 2
  Filled 2014-06-04: qty 1
  Filled 2014-06-04: qty 2
  Filled 2014-06-04 (×4): qty 1

## 2014-06-04 MED ORDER — CLOBETASOL PROPIONATE 0.05 % EX CREA
1.0000 "application " | TOPICAL_CREAM | Freq: Every day | CUTANEOUS | Status: DC | PRN
  Filled 2014-06-04: qty 15
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
## 2014-06-04 NOTE — ED Provider Notes (Signed)
Medical screening examination/treatment/procedure(s) were performed by non-physician practitioner and as supervising physician I was immediately available for consultation/collaboration.   EKG Interpretation None        Lyanne Co, MD 06/04/14 [phone]

## 2014-06-04 NOTE — Progress Notes (Signed)
This patient has been seen and I agree with the findings and treatment plan.  Prajwal Fellner O. Saqib Cazarez, III, MD, FACS [phone] (pager) [phone] (direct pager) Trauma Surgeon  

## 2014-06-04 NOTE — Progress Notes (Signed)
Utilization review completed.  
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
## 2014-06-05 MED ORDER — MAGNESIUM HYDROXIDE 400 MG/5ML PO SUSP
30.0000 mL | Freq: Every day | ORAL | Status: DC
  Administered 2014-06-05 – 2014-06-06 (×2): 30 mL via ORAL
  Filled 2014-06-05 (×2): qty 30
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
## 2014-06-05 NOTE — Progress Notes (Signed)
Patient taking Glucotrol 2.5 mg daily for diabetes.  Recommend checking CBGs TID before meals and adding Novolog SENSITIVE correction scale TID while in the hospital.  Smith Mince RN BSN CDE
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
## 2014-06-06 LAB — CBC
HCT: 28.8 % — ABNORMAL LOW (ref 36.0–46.0)
HEMOGLOBIN: 9.4 g/dL — AB (ref 12.0–15.0)
MCH: 29.9 pg (ref 26.0–34.0)
MCHC: 32.6 g/dL (ref 30.0–36.0)
MCV: 91.7 fL (ref 78.0–100.0)
Platelets: 134 10*3/uL — ABNORMAL LOW (ref 150–400)
RBC: 3.14 MIL/uL — ABNORMAL LOW (ref 3.87–5.11)
RDW: 15 % (ref 11.5–15.5)
WBC: 7.5 10*3/uL (ref 4.0–10.5)

## 2014-06-06 MED ORDER — TRAMADOL HCL 50 MG PO TABS: 50.0000 mg | ORAL_TABLET | Freq: Four times a day (QID) | ORAL | Status: DC | PRN

## 2014-06-06 MED ORDER — NAPROXEN 500 MG PO TABS: 500.0000 mg | ORAL_TABLET | Freq: Two times a day (BID) | ORAL | Status: DC

## 2014-06-06 MED ORDER — ONDANSETRON HCL 4 MG PO TABS: 4.0000 mg | ORAL_TABLET | Freq: Four times a day (QID) | ORAL | Status: DC | PRN

## 2014-06-06 NOTE — Progress Notes (Signed)
Pt. Discharge to home. Discharge instruction given to patient. No question verbalized. 
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
## 2014-06-06 NOTE — Discharge Summary (Signed)
Okay to go home per PT.  This patient has been seen and I agree with the findings and treatment plan.  Marta Lamas. Gae Bon, MD, FACS [phone] (pager) [phone] (direct pager) Trauma Surgeon
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
## 2014-06-06 NOTE — Progress Notes (Signed)
To work with PT again prior to discharge  This patient has been seen and I agree with the findings and treatment plan.  Marta Lamas. Gae Bon, MD, FACS [phone] (pager) [phone] (direct pager) Trauma Surgeon
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
## 2014-06-18 DIAGNOSIS — E1129 Type 2 diabetes mellitus with other diabetic kidney complication: Secondary | ICD-10-CM | POA: Diagnosis not present

## 2014-06-18 DIAGNOSIS — I10 Essential (primary) hypertension: Secondary | ICD-10-CM | POA: Diagnosis not present

## 2014-06-18 DIAGNOSIS — E7800 Pure hypercholesterolemia, unspecified: Secondary | ICD-10-CM | POA: Diagnosis not present

## 2014-06-18 DIAGNOSIS — I2510 Atherosclerotic heart disease of native coronary artery without angina pectoris: Secondary | ICD-10-CM | POA: Diagnosis not present

## 2014-06-20 DIAGNOSIS — N182 Chronic kidney disease, stage 2 (mild): Secondary | ICD-10-CM | POA: Diagnosis not present

## 2014-06-20 DIAGNOSIS — I2510 Atherosclerotic heart disease of native coronary artery without angina pectoris: Secondary | ICD-10-CM | POA: Diagnosis not present

## 2014-06-20 DIAGNOSIS — M069 Rheumatoid arthritis, unspecified: Secondary | ICD-10-CM | POA: Diagnosis not present

## 2014-06-20 DIAGNOSIS — I129 Hypertensive chronic kidney disease with stage 1 through stage 4 chronic kidney disease, or unspecified chronic kidney disease: Secondary | ICD-10-CM | POA: Diagnosis not present

## 2014-06-20 DIAGNOSIS — M545 Low back pain, unspecified: Secondary | ICD-10-CM | POA: Diagnosis not present

## 2014-06-20 DIAGNOSIS — E1165 Type 2 diabetes mellitus with hyperglycemia: Secondary | ICD-10-CM | POA: Diagnosis not present

## 2014-06-20 DIAGNOSIS — E1129 Type 2 diabetes mellitus with other diabetic kidney complication: Secondary | ICD-10-CM | POA: Diagnosis not present

## 2014-06-20 DIAGNOSIS — R262 Difficulty in walking, not elsewhere classified: Secondary | ICD-10-CM | POA: Diagnosis not present

## 2014-06-20 DIAGNOSIS — M25519 Pain in unspecified shoulder: Secondary | ICD-10-CM | POA: Diagnosis not present

## 2014-06-20 DIAGNOSIS — M6281 Muscle weakness (generalized): Secondary | ICD-10-CM | POA: Diagnosis not present

## 2014-06-22 DIAGNOSIS — M545 Low back pain, unspecified: Secondary | ICD-10-CM | POA: Diagnosis not present

## 2014-06-22 DIAGNOSIS — E1165 Type 2 diabetes mellitus with hyperglycemia: Secondary | ICD-10-CM | POA: Diagnosis not present

## 2014-06-22 DIAGNOSIS — N182 Chronic kidney disease, stage 2 (mild): Secondary | ICD-10-CM | POA: Diagnosis not present

## 2014-06-22 DIAGNOSIS — E1129 Type 2 diabetes mellitus with other diabetic kidney complication: Secondary | ICD-10-CM | POA: Diagnosis not present

## 2014-06-22 DIAGNOSIS — I129 Hypertensive chronic kidney disease with stage 1 through stage 4 chronic kidney disease, or unspecified chronic kidney disease: Secondary | ICD-10-CM | POA: Diagnosis not present

## 2014-06-22 DIAGNOSIS — M25519 Pain in unspecified shoulder: Secondary | ICD-10-CM | POA: Diagnosis not present

## 2014-06-22 DIAGNOSIS — I2510 Atherosclerotic heart disease of native coronary artery without angina pectoris: Secondary | ICD-10-CM | POA: Diagnosis not present

## 2014-06-25 DIAGNOSIS — M25519 Pain in unspecified shoulder: Secondary | ICD-10-CM | POA: Diagnosis not present

## 2014-06-25 DIAGNOSIS — N182 Chronic kidney disease, stage 2 (mild): Secondary | ICD-10-CM | POA: Diagnosis not present

## 2014-06-25 DIAGNOSIS — E1129 Type 2 diabetes mellitus with other diabetic kidney complication: Secondary | ICD-10-CM | POA: Diagnosis not present

## 2014-06-25 DIAGNOSIS — M545 Low back pain, unspecified: Secondary | ICD-10-CM | POA: Diagnosis not present

## 2014-06-25 DIAGNOSIS — I129 Hypertensive chronic kidney disease with stage 1 through stage 4 chronic kidney disease, or unspecified chronic kidney disease: Secondary | ICD-10-CM | POA: Diagnosis not present

## 2014-06-25 DIAGNOSIS — I2510 Atherosclerotic heart disease of native coronary artery without angina pectoris: Secondary | ICD-10-CM | POA: Diagnosis not present

## 2014-06-27 DIAGNOSIS — I129 Hypertensive chronic kidney disease with stage 1 through stage 4 chronic kidney disease, or unspecified chronic kidney disease: Secondary | ICD-10-CM | POA: Diagnosis not present

## 2014-06-27 DIAGNOSIS — M545 Low back pain, unspecified: Secondary | ICD-10-CM | POA: Diagnosis not present

## 2014-06-27 DIAGNOSIS — E1129 Type 2 diabetes mellitus with other diabetic kidney complication: Secondary | ICD-10-CM | POA: Diagnosis not present

## 2014-06-27 DIAGNOSIS — E1165 Type 2 diabetes mellitus with hyperglycemia: Secondary | ICD-10-CM | POA: Diagnosis not present

## 2014-06-27 DIAGNOSIS — M25519 Pain in unspecified shoulder: Secondary | ICD-10-CM | POA: Diagnosis not present

## 2014-06-27 DIAGNOSIS — I2510 Atherosclerotic heart disease of native coronary artery without angina pectoris: Secondary | ICD-10-CM | POA: Diagnosis not present

## 2014-06-27 DIAGNOSIS — N182 Chronic kidney disease, stage 2 (mild): Secondary | ICD-10-CM | POA: Diagnosis not present

## 2014-06-28 DIAGNOSIS — I2510 Atherosclerotic heart disease of native coronary artery without angina pectoris: Secondary | ICD-10-CM | POA: Diagnosis not present

## 2014-06-28 DIAGNOSIS — E1129 Type 2 diabetes mellitus with other diabetic kidney complication: Secondary | ICD-10-CM | POA: Diagnosis not present

## 2014-06-28 DIAGNOSIS — M545 Low back pain, unspecified: Secondary | ICD-10-CM | POA: Diagnosis not present

## 2014-06-28 DIAGNOSIS — I129 Hypertensive chronic kidney disease with stage 1 through stage 4 chronic kidney disease, or unspecified chronic kidney disease: Secondary | ICD-10-CM | POA: Diagnosis not present

## 2014-06-28 DIAGNOSIS — M25519 Pain in unspecified shoulder: Secondary | ICD-10-CM | POA: Diagnosis not present

## 2014-06-28 DIAGNOSIS — N182 Chronic kidney disease, stage 2 (mild): Secondary | ICD-10-CM | POA: Diagnosis not present

## 2014-06-28 DIAGNOSIS — E1165 Type 2 diabetes mellitus with hyperglycemia: Secondary | ICD-10-CM | POA: Diagnosis not present

## 2014-06-29 DIAGNOSIS — M545 Low back pain, unspecified: Secondary | ICD-10-CM | POA: Diagnosis not present

## 2014-06-29 DIAGNOSIS — N182 Chronic kidney disease, stage 2 (mild): Secondary | ICD-10-CM | POA: Diagnosis not present

## 2014-06-29 DIAGNOSIS — I2510 Atherosclerotic heart disease of native coronary artery without angina pectoris: Secondary | ICD-10-CM | POA: Diagnosis not present

## 2014-06-29 DIAGNOSIS — E1129 Type 2 diabetes mellitus with other diabetic kidney complication: Secondary | ICD-10-CM | POA: Diagnosis not present

## 2014-06-29 DIAGNOSIS — M25519 Pain in unspecified shoulder: Secondary | ICD-10-CM | POA: Diagnosis not present

## 2014-06-29 DIAGNOSIS — I129 Hypertensive chronic kidney disease with stage 1 through stage 4 chronic kidney disease, or unspecified chronic kidney disease: Secondary | ICD-10-CM | POA: Diagnosis not present

## 2014-07-02 DIAGNOSIS — M545 Low back pain, unspecified: Secondary | ICD-10-CM | POA: Diagnosis not present

## 2014-07-02 DIAGNOSIS — E1129 Type 2 diabetes mellitus with other diabetic kidney complication: Secondary | ICD-10-CM | POA: Diagnosis not present

## 2014-07-02 DIAGNOSIS — M25519 Pain in unspecified shoulder: Secondary | ICD-10-CM | POA: Diagnosis not present

## 2014-07-02 DIAGNOSIS — I2510 Atherosclerotic heart disease of native coronary artery without angina pectoris: Secondary | ICD-10-CM | POA: Diagnosis not present

## 2014-07-02 DIAGNOSIS — N182 Chronic kidney disease, stage 2 (mild): Secondary | ICD-10-CM | POA: Diagnosis not present

## 2014-07-02 DIAGNOSIS — I129 Hypertensive chronic kidney disease with stage 1 through stage 4 chronic kidney disease, or unspecified chronic kidney disease: Secondary | ICD-10-CM | POA: Diagnosis not present

## 2014-07-04 DIAGNOSIS — I2510 Atherosclerotic heart disease of native coronary artery without angina pectoris: Secondary | ICD-10-CM | POA: Diagnosis not present

## 2014-07-04 DIAGNOSIS — M545 Low back pain, unspecified: Secondary | ICD-10-CM | POA: Diagnosis not present

## 2014-07-04 DIAGNOSIS — N182 Chronic kidney disease, stage 2 (mild): Secondary | ICD-10-CM | POA: Diagnosis not present

## 2014-07-04 DIAGNOSIS — E1129 Type 2 diabetes mellitus with other diabetic kidney complication: Secondary | ICD-10-CM | POA: Diagnosis not present

## 2014-07-04 DIAGNOSIS — M25519 Pain in unspecified shoulder: Secondary | ICD-10-CM | POA: Diagnosis not present

## 2014-07-04 DIAGNOSIS — I129 Hypertensive chronic kidney disease with stage 1 through stage 4 chronic kidney disease, or unspecified chronic kidney disease: Secondary | ICD-10-CM | POA: Diagnosis not present

## 2014-07-05 DIAGNOSIS — N182 Chronic kidney disease, stage 2 (mild): Secondary | ICD-10-CM | POA: Diagnosis not present

## 2014-07-05 DIAGNOSIS — M545 Low back pain, unspecified: Secondary | ICD-10-CM | POA: Diagnosis not present

## 2014-07-05 DIAGNOSIS — E1129 Type 2 diabetes mellitus with other diabetic kidney complication: Secondary | ICD-10-CM | POA: Diagnosis not present

## 2014-07-05 DIAGNOSIS — I129 Hypertensive chronic kidney disease with stage 1 through stage 4 chronic kidney disease, or unspecified chronic kidney disease: Secondary | ICD-10-CM | POA: Diagnosis not present

## 2014-07-05 DIAGNOSIS — I2510 Atherosclerotic heart disease of native coronary artery without angina pectoris: Secondary | ICD-10-CM | POA: Diagnosis not present

## 2014-07-05 DIAGNOSIS — M25519 Pain in unspecified shoulder: Secondary | ICD-10-CM | POA: Diagnosis not present

## 2014-07-06 DIAGNOSIS — M545 Low back pain, unspecified: Secondary | ICD-10-CM | POA: Diagnosis not present

## 2014-07-06 DIAGNOSIS — I129 Hypertensive chronic kidney disease with stage 1 through stage 4 chronic kidney disease, or unspecified chronic kidney disease: Secondary | ICD-10-CM | POA: Diagnosis not present

## 2014-07-06 DIAGNOSIS — E1165 Type 2 diabetes mellitus with hyperglycemia: Secondary | ICD-10-CM | POA: Diagnosis not present

## 2014-07-06 DIAGNOSIS — E1129 Type 2 diabetes mellitus with other diabetic kidney complication: Secondary | ICD-10-CM | POA: Diagnosis not present

## 2014-07-06 DIAGNOSIS — I2510 Atherosclerotic heart disease of native coronary artery without angina pectoris: Secondary | ICD-10-CM | POA: Diagnosis not present

## 2014-07-06 DIAGNOSIS — M25519 Pain in unspecified shoulder: Secondary | ICD-10-CM | POA: Diagnosis not present

## 2014-07-06 DIAGNOSIS — N182 Chronic kidney disease, stage 2 (mild): Secondary | ICD-10-CM | POA: Diagnosis not present

## 2014-07-09 DIAGNOSIS — I129 Hypertensive chronic kidney disease with stage 1 through stage 4 chronic kidney disease, or unspecified chronic kidney disease: Secondary | ICD-10-CM | POA: Diagnosis not present

## 2014-07-09 DIAGNOSIS — M545 Low back pain, unspecified: Secondary | ICD-10-CM | POA: Diagnosis not present

## 2014-07-09 DIAGNOSIS — I2510 Atherosclerotic heart disease of native coronary artery without angina pectoris: Secondary | ICD-10-CM | POA: Diagnosis not present

## 2014-07-09 DIAGNOSIS — M25519 Pain in unspecified shoulder: Secondary | ICD-10-CM | POA: Diagnosis not present

## 2014-07-09 DIAGNOSIS — N182 Chronic kidney disease, stage 2 (mild): Secondary | ICD-10-CM | POA: Diagnosis not present

## 2014-07-09 DIAGNOSIS — E1129 Type 2 diabetes mellitus with other diabetic kidney complication: Secondary | ICD-10-CM | POA: Diagnosis not present

## 2014-07-10 DIAGNOSIS — M25519 Pain in unspecified shoulder: Secondary | ICD-10-CM | POA: Diagnosis not present

## 2014-07-10 DIAGNOSIS — I129 Hypertensive chronic kidney disease with stage 1 through stage 4 chronic kidney disease, or unspecified chronic kidney disease: Secondary | ICD-10-CM | POA: Diagnosis not present

## 2014-07-10 DIAGNOSIS — I2510 Atherosclerotic heart disease of native coronary artery without angina pectoris: Secondary | ICD-10-CM | POA: Diagnosis not present

## 2014-07-10 DIAGNOSIS — M545 Low back pain, unspecified: Secondary | ICD-10-CM | POA: Diagnosis not present

## 2014-07-10 DIAGNOSIS — N182 Chronic kidney disease, stage 2 (mild): Secondary | ICD-10-CM | POA: Diagnosis not present

## 2014-07-10 DIAGNOSIS — E1165 Type 2 diabetes mellitus with hyperglycemia: Secondary | ICD-10-CM | POA: Diagnosis not present

## 2014-07-10 DIAGNOSIS — E1129 Type 2 diabetes mellitus with other diabetic kidney complication: Secondary | ICD-10-CM | POA: Diagnosis not present

## 2014-07-11 DIAGNOSIS — M545 Low back pain, unspecified: Secondary | ICD-10-CM | POA: Diagnosis not present

## 2014-07-11 DIAGNOSIS — E1129 Type 2 diabetes mellitus with other diabetic kidney complication: Secondary | ICD-10-CM | POA: Diagnosis not present

## 2014-07-11 DIAGNOSIS — N182 Chronic kidney disease, stage 2 (mild): Secondary | ICD-10-CM | POA: Diagnosis not present

## 2014-07-11 DIAGNOSIS — M25519 Pain in unspecified shoulder: Secondary | ICD-10-CM | POA: Diagnosis not present

## 2014-07-11 DIAGNOSIS — I2510 Atherosclerotic heart disease of native coronary artery without angina pectoris: Secondary | ICD-10-CM | POA: Diagnosis not present

## 2014-07-11 DIAGNOSIS — I129 Hypertensive chronic kidney disease with stage 1 through stage 4 chronic kidney disease, or unspecified chronic kidney disease: Secondary | ICD-10-CM | POA: Diagnosis not present

## 2014-07-13 DIAGNOSIS — M25519 Pain in unspecified shoulder: Secondary | ICD-10-CM | POA: Diagnosis not present

## 2014-07-13 DIAGNOSIS — E1129 Type 2 diabetes mellitus with other diabetic kidney complication: Secondary | ICD-10-CM | POA: Diagnosis not present

## 2014-07-13 DIAGNOSIS — N182 Chronic kidney disease, stage 2 (mild): Secondary | ICD-10-CM | POA: Diagnosis not present

## 2014-07-13 DIAGNOSIS — M545 Low back pain, unspecified: Secondary | ICD-10-CM | POA: Diagnosis not present

## 2014-07-13 DIAGNOSIS — I129 Hypertensive chronic kidney disease with stage 1 through stage 4 chronic kidney disease, or unspecified chronic kidney disease: Secondary | ICD-10-CM | POA: Diagnosis not present

## 2014-07-13 DIAGNOSIS — I2510 Atherosclerotic heart disease of native coronary artery without angina pectoris: Secondary | ICD-10-CM | POA: Diagnosis not present

## 2014-07-16 ENCOUNTER — Telehealth (HOSPITAL_COMMUNITY): Payer: Self-pay

## 2014-07-17 ENCOUNTER — Emergency Department (HOSPITAL_COMMUNITY): Payer: Medicare Other

## 2014-07-17 ENCOUNTER — Other Ambulatory Visit (INDEPENDENT_AMBULATORY_CARE_PROVIDER_SITE_OTHER): Payer: Self-pay | Admitting: General Surgery

## 2014-07-17 ENCOUNTER — Encounter (HOSPITAL_COMMUNITY): Payer: Self-pay | Admitting: Emergency Medicine

## 2014-07-17 ENCOUNTER — Emergency Department (HOSPITAL_COMMUNITY)
Admission: EM | Admit: 2014-07-17 | Discharge: 2014-07-17 | Disposition: A | Discharge status: Home / Self Care | Payer: Medicare Other | Attending: Emergency Medicine | Admitting: Emergency Medicine

## 2014-07-17 DIAGNOSIS — E119 Type 2 diabetes mellitus without complications: Secondary | ICD-10-CM | POA: Diagnosis not present

## 2014-07-17 DIAGNOSIS — M545 Low back pain, unspecified: Secondary | ICD-10-CM | POA: Diagnosis not present

## 2014-07-17 DIAGNOSIS — Z791 Long term (current) use of non-steroidal anti-inflammatories (NSAID): Secondary | ICD-10-CM | POA: Diagnosis not present

## 2014-07-17 DIAGNOSIS — M19011 Primary osteoarthritis, right shoulder: Secondary | ICD-10-CM

## 2014-07-17 DIAGNOSIS — Z79899 Other long term (current) drug therapy: Secondary | ICD-10-CM | POA: Diagnosis not present

## 2014-07-17 DIAGNOSIS — M069 Rheumatoid arthritis, unspecified: Secondary | ICD-10-CM | POA: Insufficient documentation

## 2014-07-17 DIAGNOSIS — I129 Hypertensive chronic kidney disease with stage 1 through stage 4 chronic kidney disease, or unspecified chronic kidney disease: Secondary | ICD-10-CM | POA: Diagnosis not present

## 2014-07-17 DIAGNOSIS — N182 Chronic kidney disease, stage 2 (mild): Secondary | ICD-10-CM | POA: Diagnosis not present

## 2014-07-17 DIAGNOSIS — M47817 Spondylosis without myelopathy or radiculopathy, lumbosacral region: Secondary | ICD-10-CM | POA: Diagnosis not present

## 2014-07-17 DIAGNOSIS — IMO0002 Reserved for concepts with insufficient information to code with codable children: Secondary | ICD-10-CM | POA: Insufficient documentation

## 2014-07-17 DIAGNOSIS — S32009S Unspecified fracture of unspecified lumbar vertebra, sequela: Secondary | ICD-10-CM

## 2014-07-17 DIAGNOSIS — M19019 Primary osteoarthritis, unspecified shoulder: Secondary | ICD-10-CM | POA: Diagnosis not present

## 2014-07-17 DIAGNOSIS — Z7982 Long term (current) use of aspirin: Secondary | ICD-10-CM | POA: Diagnosis not present

## 2014-07-17 DIAGNOSIS — I2510 Atherosclerotic heart disease of native coronary artery without angina pectoris: Secondary | ICD-10-CM | POA: Diagnosis not present

## 2014-07-17 DIAGNOSIS — M25519 Pain in unspecified shoulder: Secondary | ICD-10-CM | POA: Diagnosis not present

## 2014-07-17 DIAGNOSIS — R921 Mammographic calcification found on diagnostic imaging of breast: Secondary | ICD-10-CM

## 2014-07-17 DIAGNOSIS — E1129 Type 2 diabetes mellitus with other diabetic kidney complication: Secondary | ICD-10-CM | POA: Diagnosis not present

## 2014-07-17 MED ORDER — NAPROXEN 500 MG PO TABS: 500.0000 mg | ORAL_TABLET | Freq: Two times a day (BID) | ORAL | Status: DC
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
## 2014-07-17 NOTE — Telephone Encounter (Signed)
Pt reports intermittent night time pain.  Was apparently told by her PCP that he cannot manage her since he did not treat her in the hospital.  Will proceed with a XR of lumbar spine.  If stable, refer back to PCP for management of chronic low back pain which is a primary care problem.  Kurstin Dimarzo, ANP-BC
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
## 2014-07-18 ENCOUNTER — Telehealth (INDEPENDENT_AMBULATORY_CARE_PROVIDER_SITE_OTHER): Payer: Self-pay | Admitting: *Deleted

## 2014-07-18 NOTE — ED Provider Notes (Signed)
Medical screening examination/treatment/procedure(s) were performed by non-physician practitioner and as supervising physician I was immediately available for consultation/collaboration.   EKG Interpretation None       Doug Sou, MD 07/18/14 [id number]

## 2014-07-18 NOTE — Telephone Encounter (Signed)
LMOM regarding Xray of Lumbar.  Please let pt know she does not need an appt to have this done.  She can just go to [facility] and get this done.  Victorino Dike

## 2014-07-19 DIAGNOSIS — M159 Polyosteoarthritis, unspecified: Secondary | ICD-10-CM | POA: Diagnosis not present

## 2014-07-19 DIAGNOSIS — M810 Age-related osteoporosis without current pathological fracture: Secondary | ICD-10-CM | POA: Diagnosis not present

## 2014-07-19 DIAGNOSIS — M069 Rheumatoid arthritis, unspecified: Secondary | ICD-10-CM | POA: Diagnosis not present

## 2014-07-24 DIAGNOSIS — M069 Rheumatoid arthritis, unspecified: Secondary | ICD-10-CM | POA: Diagnosis not present

## 2014-07-24 DIAGNOSIS — E1165 Type 2 diabetes mellitus with hyperglycemia: Secondary | ICD-10-CM | POA: Diagnosis not present

## 2014-07-24 DIAGNOSIS — I2510 Atherosclerotic heart disease of native coronary artery without angina pectoris: Secondary | ICD-10-CM | POA: Diagnosis not present

## 2014-07-24 DIAGNOSIS — M545 Low back pain, unspecified: Secondary | ICD-10-CM | POA: Diagnosis not present

## 2014-07-24 DIAGNOSIS — I129 Hypertensive chronic kidney disease with stage 1 through stage 4 chronic kidney disease, or unspecified chronic kidney disease: Secondary | ICD-10-CM | POA: Diagnosis not present

## 2014-07-24 DIAGNOSIS — E1129 Type 2 diabetes mellitus with other diabetic kidney complication: Secondary | ICD-10-CM | POA: Diagnosis not present

## 2014-07-24 DIAGNOSIS — M25519 Pain in unspecified shoulder: Secondary | ICD-10-CM | POA: Diagnosis not present

## 2014-07-24 DIAGNOSIS — N182 Chronic kidney disease, stage 2 (mild): Secondary | ICD-10-CM | POA: Diagnosis not present

## 2014-08-08 DIAGNOSIS — M810 Age-related osteoporosis without current pathological fracture: Secondary | ICD-10-CM | POA: Diagnosis not present

## 2014-08-13 DIAGNOSIS — S32009A Unspecified fracture of unspecified lumbar vertebra, initial encounter for closed fracture: Secondary | ICD-10-CM | POA: Diagnosis not present

## 2014-08-13 DIAGNOSIS — M47817 Spondylosis without myelopathy or radiculopathy, lumbosacral region: Secondary | ICD-10-CM | POA: Diagnosis not present

## 2014-08-23 DIAGNOSIS — E119 Type 2 diabetes mellitus without complications: Secondary | ICD-10-CM | POA: Diagnosis not present

## 2014-08-23 DIAGNOSIS — H40013 Open angle with borderline findings, low risk, bilateral: Secondary | ICD-10-CM | POA: Diagnosis not present

## 2014-08-23 DIAGNOSIS — H2513 Age-related nuclear cataract, bilateral: Secondary | ICD-10-CM | POA: Diagnosis not present

## 2014-08-23 DIAGNOSIS — Z83511 Family history of glaucoma: Secondary | ICD-10-CM | POA: Diagnosis not present

## 2014-08-27 ENCOUNTER — Encounter (INDEPENDENT_AMBULATORY_CARE_PROVIDER_SITE_OTHER): Payer: Self-pay

## 2014-08-27 ENCOUNTER — Ambulatory Visit
Admission: RE | Admit: 2014-08-27 | Discharge: 2014-08-27 | Disposition: A | Discharge status: Home / Self Care | Payer: Medicare Other | Source: Ambulatory Visit | Attending: Internal Medicine | Admitting: Internal Medicine

## 2014-08-27 DIAGNOSIS — R921 Mammographic calcification found on diagnostic imaging of breast: Secondary | ICD-10-CM | POA: Diagnosis not present

## 2014-09-03 DIAGNOSIS — Z23 Encounter for immunization: Secondary | ICD-10-CM | POA: Diagnosis not present

## 2014-09-03 DIAGNOSIS — M0589 Other rheumatoid arthritis with rheumatoid factor of multiple sites: Secondary | ICD-10-CM | POA: Diagnosis not present

## 2014-09-17 DIAGNOSIS — M170 Bilateral primary osteoarthritis of knee: Secondary | ICD-10-CM | POA: Diagnosis not present

## 2014-09-17 DIAGNOSIS — M8580 Other specified disorders of bone density and structure, unspecified site: Secondary | ICD-10-CM | POA: Diagnosis not present

## 2014-09-17 DIAGNOSIS — M0589 Other rheumatoid arthritis with rheumatoid factor of multiple sites: Secondary | ICD-10-CM | POA: Diagnosis not present

## 2014-09-17 DIAGNOSIS — E1165 Type 2 diabetes mellitus with hyperglycemia: Secondary | ICD-10-CM | POA: Diagnosis not present

## 2014-09-17 DIAGNOSIS — I10 Essential (primary) hypertension: Secondary | ICD-10-CM | POA: Diagnosis not present

## 2014-09-17 DIAGNOSIS — E1121 Type 2 diabetes mellitus with diabetic nephropathy: Secondary | ICD-10-CM | POA: Diagnosis not present

## 2014-09-17 DIAGNOSIS — M25569 Pain in unspecified knee: Secondary | ICD-10-CM | POA: Diagnosis not present

## 2014-09-18 DIAGNOSIS — E780 Pure hypercholesterolemia: Secondary | ICD-10-CM | POA: Diagnosis not present

## 2014-09-18 DIAGNOSIS — I2510 Atherosclerotic heart disease of native coronary artery without angina pectoris: Secondary | ICD-10-CM | POA: Diagnosis not present

## 2014-09-18 DIAGNOSIS — I10 Essential (primary) hypertension: Secondary | ICD-10-CM | POA: Diagnosis not present

## 2014-09-18 DIAGNOSIS — E1121 Type 2 diabetes mellitus with diabetic nephropathy: Secondary | ICD-10-CM | POA: Diagnosis not present
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
## 2014-10-16 DIAGNOSIS — M0589 Other rheumatoid arthritis with rheumatoid factor of multiple sites: Secondary | ICD-10-CM | POA: Diagnosis not present

## 2014-10-22 DIAGNOSIS — M0589 Other rheumatoid arthritis with rheumatoid factor of multiple sites: Secondary | ICD-10-CM | POA: Diagnosis not present

## 2014-10-22 DIAGNOSIS — M79643 Pain in unspecified hand: Secondary | ICD-10-CM | POA: Diagnosis not present

## 2014-10-22 DIAGNOSIS — M8580 Other specified disorders of bone density and structure, unspecified site: Secondary | ICD-10-CM | POA: Diagnosis not present

## 2014-10-22 DIAGNOSIS — M170 Bilateral primary osteoarthritis of knee: Secondary | ICD-10-CM | POA: Diagnosis not present

## 2014-11-26 DIAGNOSIS — M0589 Other rheumatoid arthritis with rheumatoid factor of multiple sites: Secondary | ICD-10-CM | POA: Diagnosis not present

## 2014-12-03 DIAGNOSIS — M170 Bilateral primary osteoarthritis of knee: Secondary | ICD-10-CM | POA: Diagnosis not present

## 2014-12-03 DIAGNOSIS — M8580 Other specified disorders of bone density and structure, unspecified site: Secondary | ICD-10-CM | POA: Diagnosis not present

## 2014-12-03 DIAGNOSIS — M0589 Other rheumatoid arthritis with rheumatoid factor of multiple sites: Secondary | ICD-10-CM | POA: Diagnosis not present

## 2014-12-10 DIAGNOSIS — M0589 Other rheumatoid arthritis with rheumatoid factor of multiple sites: Secondary | ICD-10-CM | POA: Diagnosis not present

## 2014-12-12 DIAGNOSIS — M810 Age-related osteoporosis without current pathological fracture: Secondary | ICD-10-CM | POA: Diagnosis not present

## 2014-12-12 DIAGNOSIS — E780 Pure hypercholesterolemia: Secondary | ICD-10-CM | POA: Diagnosis not present

## 2014-12-12 DIAGNOSIS — I10 Essential (primary) hypertension: Secondary | ICD-10-CM | POA: Diagnosis not present

## 2014-12-12 DIAGNOSIS — E1121 Type 2 diabetes mellitus with diabetic nephropathy: Secondary | ICD-10-CM | POA: Diagnosis not present

## 2014-12-19 DIAGNOSIS — E785 Hyperlipidemia, unspecified: Secondary | ICD-10-CM | POA: Diagnosis not present

## 2014-12-19 DIAGNOSIS — E1121 Type 2 diabetes mellitus with diabetic nephropathy: Secondary | ICD-10-CM | POA: Diagnosis not present

## 2014-12-19 DIAGNOSIS — I10 Essential (primary) hypertension: Secondary | ICD-10-CM | POA: Diagnosis not present

## 2014-12-19 DIAGNOSIS — I2510 Atherosclerotic heart disease of native coronary artery without angina pectoris: Secondary | ICD-10-CM | POA: Diagnosis not present

## 2014-12-24 DIAGNOSIS — M0589 Other rheumatoid arthritis with rheumatoid factor of multiple sites: Secondary | ICD-10-CM | POA: Diagnosis not present

## 2015-01-21 DIAGNOSIS — M0589 Other rheumatoid arthritis with rheumatoid factor of multiple sites: Secondary | ICD-10-CM | POA: Diagnosis not present

## 2015-01-23 DIAGNOSIS — E785 Hyperlipidemia, unspecified: Secondary | ICD-10-CM | POA: Diagnosis not present

## 2015-01-23 DIAGNOSIS — E1165 Type 2 diabetes mellitus with hyperglycemia: Secondary | ICD-10-CM | POA: Diagnosis not present

## 2015-01-23 DIAGNOSIS — E1121 Type 2 diabetes mellitus with diabetic nephropathy: Secondary | ICD-10-CM | POA: Diagnosis not present

## 2015-01-23 DIAGNOSIS — I10 Essential (primary) hypertension: Secondary | ICD-10-CM | POA: Diagnosis not present

## 2015-01-31 DIAGNOSIS — M170 Bilateral primary osteoarthritis of knee: Secondary | ICD-10-CM | POA: Diagnosis not present

## 2015-01-31 DIAGNOSIS — M0589 Other rheumatoid arthritis with rheumatoid factor of multiple sites: Secondary | ICD-10-CM | POA: Diagnosis not present

## 2015-01-31 DIAGNOSIS — M8580 Other specified disorders of bone density and structure, unspecified site: Secondary | ICD-10-CM | POA: Diagnosis not present

## 2015-02-18 DIAGNOSIS — M0589 Other rheumatoid arthritis with rheumatoid factor of multiple sites: Secondary | ICD-10-CM | POA: Diagnosis not present

## 2015-02-19 DIAGNOSIS — H40013 Open angle with borderline findings, low risk, bilateral: Secondary | ICD-10-CM | POA: Diagnosis not present

## 2015-03-11 ENCOUNTER — Ambulatory Visit: Payer: Medicare Other | Admitting: Cardiology

## 2015-03-12 DIAGNOSIS — I10 Essential (primary) hypertension: Secondary | ICD-10-CM | POA: Diagnosis not present

## 2015-03-12 DIAGNOSIS — M810 Age-related osteoporosis without current pathological fracture: Secondary | ICD-10-CM | POA: Diagnosis not present

## 2015-03-12 DIAGNOSIS — E780 Pure hypercholesterolemia: Secondary | ICD-10-CM | POA: Diagnosis not present

## 2015-03-12 DIAGNOSIS — E1121 Type 2 diabetes mellitus with diabetic nephropathy: Secondary | ICD-10-CM | POA: Diagnosis not present

## 2015-03-18 DIAGNOSIS — M0589 Other rheumatoid arthritis with rheumatoid factor of multiple sites: Secondary | ICD-10-CM | POA: Diagnosis not present

## 2015-03-19 DIAGNOSIS — E1129 Type 2 diabetes mellitus with other diabetic kidney complication: Secondary | ICD-10-CM | POA: Diagnosis not present

## 2015-03-19 DIAGNOSIS — I2510 Atherosclerotic heart disease of native coronary artery without angina pectoris: Secondary | ICD-10-CM | POA: Diagnosis not present

## 2015-03-19 DIAGNOSIS — N182 Chronic kidney disease, stage 2 (mild): Secondary | ICD-10-CM | POA: Diagnosis not present

## 2015-03-19 DIAGNOSIS — M810 Age-related osteoporosis without current pathological fracture: Secondary | ICD-10-CM | POA: Diagnosis not present

## 2015-03-20 ENCOUNTER — Encounter: Payer: Self-pay | Admitting: Cardiology

## 2015-04-16 DIAGNOSIS — M0589 Other rheumatoid arthritis with rheumatoid factor of multiple sites: Secondary | ICD-10-CM | POA: Diagnosis not present

## 2015-04-24 DIAGNOSIS — N182 Chronic kidney disease, stage 2 (mild): Secondary | ICD-10-CM | POA: Diagnosis not present

## 2015-04-24 DIAGNOSIS — E1129 Type 2 diabetes mellitus with other diabetic kidney complication: Secondary | ICD-10-CM | POA: Diagnosis not present

## 2015-04-24 DIAGNOSIS — I10 Essential (primary) hypertension: Secondary | ICD-10-CM | POA: Diagnosis not present

## 2015-04-24 DIAGNOSIS — E785 Hyperlipidemia, unspecified: Secondary | ICD-10-CM | POA: Diagnosis not present

## 2015-04-29 ENCOUNTER — Ambulatory Visit (INDEPENDENT_AMBULATORY_CARE_PROVIDER_SITE_OTHER): Payer: Medicare Other | Admitting: Cardiology

## 2015-04-29 ENCOUNTER — Encounter: Payer: Self-pay | Admitting: Cardiology

## 2015-04-29 VITALS — BP 140/76 | HR 75 | Ht 62.0 in | Wt 157.0 lb

## 2015-04-29 DIAGNOSIS — I779 Disorder of arteries and arterioles, unspecified: Secondary | ICD-10-CM

## 2015-04-29 DIAGNOSIS — R0602 Shortness of breath: Secondary | ICD-10-CM | POA: Diagnosis not present

## 2015-04-29 DIAGNOSIS — I2510 Atherosclerotic heart disease of native coronary artery without angina pectoris: Secondary | ICD-10-CM

## 2015-04-29 DIAGNOSIS — E8770 Fluid overload, unspecified: Secondary | ICD-10-CM

## 2015-04-29 DIAGNOSIS — E785 Hyperlipidemia, unspecified: Secondary | ICD-10-CM | POA: Diagnosis not present

## 2015-04-29 DIAGNOSIS — I739 Peripheral vascular disease, unspecified: Secondary | ICD-10-CM

## 2015-04-29 NOTE — Patient Instructions (Signed)
Medication Instructions:  Same-no change  Labwork: None  Testing/Procedures: Your physician has requested that you have an echocardiogram. Echocardiography is a painless test that uses sound waves to create images of your heart. It provides your doctor with information about the size and shape of your heart and how well your heart's chambers and valves are working. This procedure takes approximately one hour. There are no restrictions for this procedure.   Follow-Up: Your physician wants you to follow-up in: 1 year. You will receive a reminder letter in the mail two months in advance. If you don't receive a letter, please call our office to schedule the follow-up appointment.

## 2015-04-29 NOTE — Assessment & Plan Note (Signed)
Patient had a Doppler in 2011. There was no significant plaque. There was slight increase in the velocity in the right internal carotid artery. No further workup needed at this time.
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
## 2015-04-29 NOTE — Assessment & Plan Note (Signed)
The patient has noted some mild shortness of breath. This is intermittent. She is had mild edema that usually resolves after her feet are elevated. She thinks some of this may be related to a diabetic medication. We need to reassess her LV function to be sure that she has not had a change. Two-dimensional echo will be done and I will be in touch with her with the result.

## 2015-04-29 NOTE — Assessment & Plan Note (Signed)
The patient has coronary disease. Her cath in 2004 revealed moderate disease including 80% ostial circumflex. However this lesion was not optimal for PCI. Medical therapy was recommended. Nuclear scan in 2005 and 2014 revealed no ischemia with a normal ejection fraction. No further workup at this time.

## 2015-05-06 ENCOUNTER — Other Ambulatory Visit (HOSPITAL_COMMUNITY): Payer: BC Managed Care – PPO

## 2015-05-06 DIAGNOSIS — M170 Bilateral primary osteoarthritis of knee: Secondary | ICD-10-CM | POA: Diagnosis not present

## 2015-05-06 DIAGNOSIS — M8580 Other specified disorders of bone density and structure, unspecified site: Secondary | ICD-10-CM | POA: Diagnosis not present

## 2015-05-06 DIAGNOSIS — M0589 Other rheumatoid arthritis with rheumatoid factor of multiple sites: Secondary | ICD-10-CM | POA: Diagnosis not present

## 2015-05-06 DIAGNOSIS — M25552 Pain in left hip: Secondary | ICD-10-CM | POA: Diagnosis not present

## 2015-05-08 ENCOUNTER — Other Ambulatory Visit: Payer: Self-pay

## 2015-05-08 ENCOUNTER — Ambulatory Visit (HOSPITAL_COMMUNITY): Payer: Medicare Other | Attending: Cardiology

## 2015-05-08 DIAGNOSIS — R0602 Shortness of breath: Secondary | ICD-10-CM | POA: Insufficient documentation

## 2015-05-08 DIAGNOSIS — D649 Anemia, unspecified: Secondary | ICD-10-CM | POA: Diagnosis not present

## 2015-05-08 DIAGNOSIS — E785 Hyperlipidemia, unspecified: Secondary | ICD-10-CM | POA: Diagnosis not present

## 2015-05-08 DIAGNOSIS — I2510 Atherosclerotic heart disease of native coronary artery without angina pectoris: Secondary | ICD-10-CM | POA: Insufficient documentation

## 2015-05-08 DIAGNOSIS — E8770 Fluid overload, unspecified: Secondary | ICD-10-CM | POA: Diagnosis not present

## 2015-05-08 DIAGNOSIS — E119 Type 2 diabetes mellitus without complications: Secondary | ICD-10-CM | POA: Insufficient documentation

## 2015-05-08 DIAGNOSIS — Z720 Tobacco use: Secondary | ICD-10-CM | POA: Diagnosis not present

## 2015-05-08 DIAGNOSIS — M069 Rheumatoid arthritis, unspecified: Secondary | ICD-10-CM | POA: Insufficient documentation

## 2015-05-14 DIAGNOSIS — M0589 Other rheumatoid arthritis with rheumatoid factor of multiple sites: Secondary | ICD-10-CM | POA: Diagnosis not present

## 2015-06-11 DIAGNOSIS — M0589 Other rheumatoid arthritis with rheumatoid factor of multiple sites: Secondary | ICD-10-CM | POA: Diagnosis not present

## 2015-06-19 DIAGNOSIS — E1129 Type 2 diabetes mellitus with other diabetic kidney complication: Secondary | ICD-10-CM | POA: Diagnosis not present

## 2015-06-19 DIAGNOSIS — M810 Age-related osteoporosis without current pathological fracture: Secondary | ICD-10-CM | POA: Diagnosis not present

## 2015-06-19 DIAGNOSIS — Z1389 Encounter for screening for other disorder: Secondary | ICD-10-CM | POA: Diagnosis not present

## 2015-06-19 DIAGNOSIS — I2510 Atherosclerotic heart disease of native coronary artery without angina pectoris: Secondary | ICD-10-CM | POA: Diagnosis not present

## 2015-06-19 DIAGNOSIS — Z23 Encounter for immunization: Secondary | ICD-10-CM | POA: Diagnosis not present

## 2015-06-19 DIAGNOSIS — E780 Pure hypercholesterolemia: Secondary | ICD-10-CM | POA: Diagnosis not present

## 2015-06-19 DIAGNOSIS — Z0000 Encounter for general adult medical examination without abnormal findings: Secondary | ICD-10-CM | POA: Diagnosis not present

## 2015-06-19 DIAGNOSIS — F17211 Nicotine dependence, cigarettes, in remission: Secondary | ICD-10-CM | POA: Diagnosis not present

## 2015-06-25 ENCOUNTER — Encounter: Payer: Self-pay | Admitting: Gastroenterology

## 2015-06-26 DIAGNOSIS — N182 Chronic kidney disease, stage 2 (mild): Secondary | ICD-10-CM | POA: Diagnosis not present

## 2015-06-26 DIAGNOSIS — E785 Hyperlipidemia, unspecified: Secondary | ICD-10-CM | POA: Diagnosis not present

## 2015-06-26 DIAGNOSIS — I2510 Atherosclerotic heart disease of native coronary artery without angina pectoris: Secondary | ICD-10-CM | POA: Diagnosis not present

## 2015-06-26 DIAGNOSIS — I130 Hypertensive heart and chronic kidney disease with heart failure and stage 1 through stage 4 chronic kidney disease, or unspecified chronic kidney disease: Secondary | ICD-10-CM | POA: Diagnosis not present

## 2015-06-26 DIAGNOSIS — F17211 Nicotine dependence, cigarettes, in remission: Secondary | ICD-10-CM | POA: Diagnosis not present

## 2015-07-09 DIAGNOSIS — M0589 Other rheumatoid arthritis with rheumatoid factor of multiple sites: Secondary | ICD-10-CM | POA: Diagnosis not present

## 2015-07-10 DIAGNOSIS — M810 Age-related osteoporosis without current pathological fracture: Secondary | ICD-10-CM | POA: Diagnosis not present

## 2015-07-10 DIAGNOSIS — M170 Bilateral primary osteoarthritis of knee: Secondary | ICD-10-CM | POA: Diagnosis not present

## 2015-07-10 DIAGNOSIS — M0589 Other rheumatoid arthritis with rheumatoid factor of multiple sites: Secondary | ICD-10-CM | POA: Diagnosis not present

## 2015-07-10 DIAGNOSIS — M8580 Other specified disorders of bone density and structure, unspecified site: Secondary | ICD-10-CM | POA: Diagnosis not present

## 2015-08-06 DIAGNOSIS — M0589 Other rheumatoid arthritis with rheumatoid factor of multiple sites: Secondary | ICD-10-CM | POA: Diagnosis not present

## 2015-08-12 DIAGNOSIS — M810 Age-related osteoporosis without current pathological fracture: Secondary | ICD-10-CM | POA: Diagnosis not present

## 2015-09-04 DIAGNOSIS — M0589 Other rheumatoid arthritis with rheumatoid factor of multiple sites: Secondary | ICD-10-CM | POA: Diagnosis not present

## 2015-09-04 DIAGNOSIS — R5383 Other fatigue: Secondary | ICD-10-CM | POA: Diagnosis not present

## 2015-09-09 DIAGNOSIS — M0589 Other rheumatoid arthritis with rheumatoid factor of multiple sites: Secondary | ICD-10-CM | POA: Diagnosis not present

## 2015-10-07 DIAGNOSIS — M0589 Other rheumatoid arthritis with rheumatoid factor of multiple sites: Secondary | ICD-10-CM | POA: Diagnosis not present

## 2015-10-21 DIAGNOSIS — N182 Chronic kidney disease, stage 2 (mild): Secondary | ICD-10-CM | POA: Diagnosis not present

## 2015-10-21 DIAGNOSIS — I10 Essential (primary) hypertension: Secondary | ICD-10-CM | POA: Diagnosis not present

## 2015-10-21 DIAGNOSIS — E1165 Type 2 diabetes mellitus with hyperglycemia: Secondary | ICD-10-CM | POA: Diagnosis not present

## 2015-10-21 DIAGNOSIS — E785 Hyperlipidemia, unspecified: Secondary | ICD-10-CM | POA: Diagnosis not present

## 2015-10-21 DIAGNOSIS — Z23 Encounter for immunization: Secondary | ICD-10-CM | POA: Diagnosis not present

## 2015-10-21 DIAGNOSIS — E1129 Type 2 diabetes mellitus with other diabetic kidney complication: Secondary | ICD-10-CM | POA: Diagnosis not present

## 2015-11-04 DIAGNOSIS — M0589 Other rheumatoid arthritis with rheumatoid factor of multiple sites: Secondary | ICD-10-CM | POA: Diagnosis not present

## 2015-11-12 DIAGNOSIS — M8580 Other specified disorders of bone density and structure, unspecified site: Secondary | ICD-10-CM | POA: Diagnosis not present

## 2015-11-12 DIAGNOSIS — M170 Bilateral primary osteoarthritis of knee: Secondary | ICD-10-CM | POA: Diagnosis not present

## 2015-11-12 DIAGNOSIS — M0589 Other rheumatoid arthritis with rheumatoid factor of multiple sites: Secondary | ICD-10-CM | POA: Diagnosis not present

## 2015-11-12 DIAGNOSIS — M810 Age-related osteoporosis without current pathological fracture: Secondary | ICD-10-CM | POA: Diagnosis not present

## 2015-12-02 DIAGNOSIS — M0589 Other rheumatoid arthritis with rheumatoid factor of multiple sites: Secondary | ICD-10-CM | POA: Diagnosis not present

## 2015-12-23 DIAGNOSIS — E1122 Type 2 diabetes mellitus with diabetic chronic kidney disease: Secondary | ICD-10-CM | POA: Diagnosis not present

## 2015-12-23 DIAGNOSIS — I129 Hypertensive chronic kidney disease with stage 1 through stage 4 chronic kidney disease, or unspecified chronic kidney disease: Secondary | ICD-10-CM | POA: Diagnosis not present

## 2015-12-23 DIAGNOSIS — N182 Chronic kidney disease, stage 2 (mild): Secondary | ICD-10-CM | POA: Diagnosis not present

## 2015-12-23 DIAGNOSIS — I209 Angina pectoris, unspecified: Secondary | ICD-10-CM | POA: Diagnosis not present

## 2015-12-30 DIAGNOSIS — M0589 Other rheumatoid arthritis with rheumatoid factor of multiple sites: Secondary | ICD-10-CM | POA: Diagnosis not present

## 2016-01-27 DIAGNOSIS — M0589 Other rheumatoid arthritis with rheumatoid factor of multiple sites: Secondary | ICD-10-CM | POA: Diagnosis not present

## 2016-01-28 ENCOUNTER — Encounter (HOSPITAL_COMMUNITY): Payer: Self-pay | Admitting: Emergency Medicine

## 2016-01-28 ENCOUNTER — Emergency Department (HOSPITAL_COMMUNITY): Payer: Medicare Other

## 2016-01-28 ENCOUNTER — Emergency Department (HOSPITAL_COMMUNITY)
Admission: EM | Admit: 2016-01-28 | Discharge: 2016-01-28 | Disposition: A | Discharge status: Home / Self Care | Payer: Medicare Other | Attending: Emergency Medicine | Admitting: Emergency Medicine

## 2016-01-28 DIAGNOSIS — Z7952 Long term (current) use of systemic steroids: Secondary | ICD-10-CM | POA: Diagnosis not present

## 2016-01-28 DIAGNOSIS — I2510 Atherosclerotic heart disease of native coronary artery without angina pectoris: Secondary | ICD-10-CM | POA: Diagnosis not present

## 2016-01-28 DIAGNOSIS — Z7982 Long term (current) use of aspirin: Secondary | ICD-10-CM | POA: Diagnosis not present

## 2016-01-28 DIAGNOSIS — K922 Gastrointestinal hemorrhage, unspecified: Secondary | ICD-10-CM | POA: Insufficient documentation

## 2016-01-28 DIAGNOSIS — K5730 Diverticulosis of large intestine without perforation or abscess without bleeding: Secondary | ICD-10-CM | POA: Diagnosis not present

## 2016-01-28 DIAGNOSIS — E119 Type 2 diabetes mellitus without complications: Secondary | ICD-10-CM | POA: Insufficient documentation

## 2016-01-28 DIAGNOSIS — R112 Nausea with vomiting, unspecified: Secondary | ICD-10-CM | POA: Diagnosis present

## 2016-01-28 DIAGNOSIS — K529 Noninfective gastroenteritis and colitis, unspecified: Secondary | ICD-10-CM | POA: Insufficient documentation

## 2016-01-28 DIAGNOSIS — Z79899 Other long term (current) drug therapy: Secondary | ICD-10-CM | POA: Insufficient documentation

## 2016-01-28 DIAGNOSIS — M069 Rheumatoid arthritis, unspecified: Secondary | ICD-10-CM | POA: Insufficient documentation

## 2016-01-28 DIAGNOSIS — R1033 Periumbilical pain: Secondary | ICD-10-CM | POA: Diagnosis not present

## 2016-01-28 DIAGNOSIS — Z87891 Personal history of nicotine dependence: Secondary | ICD-10-CM | POA: Insufficient documentation

## 2016-01-28 LAB — URINE MICROSCOPIC-ADD ON

## 2016-01-28 LAB — URINALYSIS, ROUTINE W REFLEX MICROSCOPIC
Bilirubin Urine: NEGATIVE
GLUCOSE, UA: NEGATIVE mg/dL
HGB URINE DIPSTICK: NEGATIVE
Ketones, ur: 15 mg/dL — AB
Leukocytes, UA: NEGATIVE
Nitrite: NEGATIVE
PROTEIN: 30 mg/dL — AB
Specific Gravity, Urine: 1.028 (ref 1.005–1.030)
pH: 5.5 (ref 5.0–8.0)

## 2016-01-28 LAB — COMPREHENSIVE METABOLIC PANEL
ALT: 20 U/L (ref 14–54)
AST: 26 U/L (ref 15–41)
Albumin: 4 g/dL (ref 3.5–5.0)
Alkaline Phosphatase: 54 U/L (ref 38–126)
Anion gap: 11 (ref 5–15)
BUN: 13 mg/dL (ref 6–20)
CHLORIDE: 106 mmol/L (ref 101–111)
CO2: 22 mmol/L (ref 22–32)
CREATININE: 0.93 mg/dL (ref 0.44–1.00)
Calcium: 9.4 mg/dL (ref 8.9–10.3)
GFR calc Af Amer: >60 mL/min (ref >60)
GFR calc non Af Amer: 59 mL/min — ABNORMAL LOW (ref >60)
Glucose, Bld: 207 mg/dL — ABNORMAL HIGH (ref 65–99)
Potassium: 3.9 mmol/L (ref 3.5–5.1)
SODIUM: 139 mmol/L (ref 135–145)
Total Bilirubin: 0.7 mg/dL (ref 0.3–1.2)
Total Protein: 7.5 g/dL (ref 6.5–8.1)

## 2016-01-28 LAB — POC OCCULT BLOOD, ED: Fecal Occult Bld: POSITIVE — AB

## 2016-01-28 LAB — CBC
HCT: 39.1 % (ref 36.0–46.0)
Hemoglobin: 13 g/dL (ref 12.0–15.0)
MCH: 30.4 pg (ref 26.0–34.0)
MCHC: 33.2 g/dL (ref 30.0–36.0)
MCV: 91.4 fL (ref 78.0–100.0)
PLATELETS: 210 10*3/uL (ref 150–400)
RBC: 4.28 MIL/uL (ref 3.87–5.11)
RDW: 15.2 % (ref 11.5–15.5)
WBC: 9 10*3/uL (ref 4.0–10.5)

## 2016-01-28 LAB — HEMOGLOBIN AND HEMATOCRIT, BLOOD
HCT: 38 % (ref 36.0–46.0)
Hemoglobin: 12.2 g/dL (ref 12.0–15.0)

## 2016-01-28 LAB — LIPASE, BLOOD: LIPASE: 110 U/L — AB (ref 11–51)

## 2016-01-28 LAB — I-STAT CG4 LACTIC ACID, ED: Lactic Acid, Venous: 0.96 mmol/L (ref 0.5–2.0)

## 2016-01-28 MED ORDER — ONDANSETRON 4 MG PO TBDP
4.0000 mg | ORAL_TABLET | Freq: Once | ORAL | Status: AC
  Administered 2016-01-28: 4 mg via ORAL

## 2016-01-28 MED ORDER — IOHEXOL 300 MG/ML  SOLN
80.0000 mL | Freq: Once | INTRAMUSCULAR | Status: AC | PRN
  Administered 2016-01-28: 100 mL via INTRAVENOUS

## 2016-01-28 MED ORDER — ONDANSETRON 4 MG PO TBDP
ORAL_TABLET | ORAL | Status: AC
  Filled 2016-01-28: qty 1

## 2016-01-28 NOTE — ED Provider Notes (Signed)
CSN: [id number]     Arrival date & time 01/28/16  0000 History   First MD Initiated Contact with Patient 01/28/16 [phone]     Chief Complaint  Patient presents with  . Abdominal Pain  . Emesis  . Diarrhea   HPI    75 year old female presents today with complaints of nausea vomiting diarrhea and abdominal pain. Patient reports that yesterday around 10:30 AM she was sitting rheumatoid arthritis IV therapy. She is been receiving some medication for 2 years with no palpitations. She reports she developed abdominal cramping and diarrhea followed by vomiting. She is unable to quantify the amount of diarrhea or vomiting but reports the diarrhea has been persistent with improvement over the last several hours. She notes that the abdominal cramping is located over her periumbilical region with no radiation. She reports the bowel movements initially were nonbloody, but reports a bloody bowel movement several minutes prior to my evaluation. She denies any exposure to abnormal food or drinks, no close sick contacts, no history of gastric problems. Routine colonoscopy in 2009 and an bile of our GI, no abnormalities. Patient is uncertain if she has hemorrhoids, and she has never had a problem with them. Vision reports that she takes aspirin daily, no other antiplatelets or anticoagulants.     Past Medical History  Diagnosis Date  . CAD (coronary artery disease)     Catheterization 2004, 60% distal LAD, 80% ostial circumflex( not optimal for PCI), 70% small RCA, medical therapy  /  nuclear June, 2005, EF 65%, no ischemia  . Edema   . Drug therapy     Intermittent steroid use  . Rheumatoid arthritis(714.0)     Hospitalization August, 2011, severe RA flare,   . Dyslipidemia   . Statin intolerance   . Ejection fraction     EF 60%, echo, November, 2011, trivial pericardial effusion  . Carotid artery disease (HCC)     Doppler, November, 2011, no significant plaque, distal R. ICA velocities are elevated and  could be source of bruit, 0-39% bilateral  . Diabetes mellitus    Past Surgical History  Procedure Laterality Date  . Cholecystectomy    . Abdominal hysterectomy     Family History  Problem Relation Age of Onset  . Hypertension Mother   . Diabetes Mother   . Other Father    Social History  Substance Use Topics  . Smoking status: Former Smoker    Quit date: 11/16/1994  . Smokeless tobacco: None  . Alcohol Use: None   OB History    No data available     Review of Systems  All other systems reviewed and are negative.   Allergies  Cholesterol; Hydrocodone; Insulins; Metformin and related; and Statins  Home Medications   Prior to Admission medications   Medication Sig Start Date End Date Taking? Authorizing Provider  Abatacept (ORENCIA) 125 MG/ML SOSY Inject 75 mg into the skin every 30 (thirty) days.   Yes Historical Provider, MD  aspirin 81 MG tablet Take 1 tablet (81 mg total) by mouth daily. 07/26/13  Yes Luis Abed, MD  Cholecalciferol (VITAMIN D-3) 1000 units CAPS Take 1 capsule by mouth daily.   Yes Historical Provider, MD  folic acid (FOLVITE) 1 MG tablet Take 1 mg by mouth daily.   Yes Historical Provider, MD  furosemide (LASIX) 40 MG tablet Take 40 mg by mouth daily. 04/09/15  Yes Historical Provider, MD  JANUVIA 100 MG tablet Take 100 mg by mouth daily. 04/25/15  Yes Historical Provider, MD  losartan (COZAAR) 50 MG tablet Take 50 mg by mouth daily. 04/22/15  Yes Historical Provider, MD  methotrexate (RHEUMATREX) 2.5 MG tablet Take 2.5 mg by mouth. Pt takes 6 tablets once weekly 04/14/15  Yes Historical Provider, MD  Multiple Vitamin (MULTIVITAMIN WITH MINERALS) TABS tablet Take 1 tablet by mouth daily. Centrum silver   Yes Historical Provider, MD  predniSONE (DELTASONE) 5 MG tablet Take 5 mg by mouth daily. 04/12/15  Yes Historical Provider, MD  naproxen (NAPROSYN) 500 MG tablet Take 1 tablet (500 mg total) by mouth 2 (two) times daily with a meal. Patient not taking:  Reported on 01/28/2016 07/17/14   Fayrene Helper, PA-C   BP 158/71 mmHg  Pulse 68  Temp(Src) 98.3 F (36.8 C) (Oral)  Resp 15  Ht 5\' 4"  (1.626 m)  Wt 71.697 kg  BMI 27.12 kg/m2  SpO2 100%   Physical Exam  Constitutional: She is oriented to person, place, and time. She appears well-developed and well-nourished.  HENT:  Head: Normocephalic and atraumatic.  Eyes: Conjunctivae are normal. Pupils are equal, round, and reactive to light. Right eye exhibits no discharge. Left eye exhibits no discharge. No scleral icterus.  Neck: Normal range of motion. No JVD present. No tracheal deviation present.  Cardiovascular: Normal rate, regular rhythm, normal heart sounds and intact distal pulses.  Exam reveals no gallop and no friction rub.   No murmur heard. Pulmonary/Chest: Effort normal. No stridor.  Abdominal: Soft. She exhibits no distension. There is tenderness. There is no rebound and no guarding.  Very minor discomfort with periumbilical palpation  Musculoskeletal: Normal range of motion. She exhibits no edema or tenderness.  Neurological: She is alert and oriented to person, place, and time. Coordination normal.  Skin: Skin is warm and dry. No rash noted. No erythema. No pallor.  Psychiatric: She has a normal mood and affect. Her behavior is normal. Judgment and thought content normal.  Nursing note and vitals reviewed.   ED Course  Procedures (including critical care time) Labs Review Labs Reviewed  LIPASE, BLOOD - Abnormal; Notable for the following:    Lipase 110 (*)    All other components within normal limits  COMPREHENSIVE METABOLIC PANEL - Abnormal; Notable for the following:    Glucose, Bld 207 (*)    GFR calc non Af Amer 59 (*)    All other components within normal limits  URINALYSIS, ROUTINE W REFLEX MICROSCOPIC (NOT AT [facility]) - Abnormal; Notable for the following:    APPearance CLOUDY (*)    Ketones, ur 15 (*)    Protein, ur 30 (*)    All other components within normal  limits  URINE MICROSCOPIC-ADD ON - Abnormal; Notable for the following:    Squamous Epithelial / LPF 0-5 (*)    Bacteria, UA RARE (*)    Casts HYALINE CASTS (*)    Crystals CA OXALATE CRYSTALS (*)    All other components within normal limits  POC OCCULT BLOOD, ED - Abnormal; Notable for the following:    Fecal Occult Bld POSITIVE (*)    All other components within normal limits  CBC  HEMOGLOBIN AND HEMATOCRIT, BLOOD  I-STAT CG4 LACTIC ACID, ED  POC OCCULT BLOOD, ED    Imaging Review Ct Abdomen Pelvis W Contrast  01/28/2016  CLINICAL DATA:  Periumbilical abdominal cramping and pain with vomiting and diarrhea since yesterday, history coronary artery disease, diabetes mellitus, former smoker, rheumatoid arthritis, dyslipidemia EXAM: CT ABDOMEN AND PELVIS WITH CONTRAST TECHNIQUE:  Multidetector CT imaging of the abdomen and pelvis was performed using the standard protocol following bolus administration of intravenous contrast. Sagittal and coronal MPR images reconstructed from axial data set. CONTRAST:  OMNIPAQUE IOHEXOL 300 MG/ML SOLN IV. No oral contrast administered. COMPARISON:  06/03/2014 FINDINGS: Minimal dependent atelectasis at lung bases. BILATERAL renal cysts largest inferior LEFT kidney 3.4 x 3.1 cm image 37. Gallbladder and uterus surgically absent. Liver, spleen, pancreas, kidneys, and adrenal glands otherwise normal appearance. Moderate-sized hiatal hernia, unable to exclude wall thickening within the hernia sac, which appears collapsed. Wall thickening identified at distal gastric antrum/pylorus. Diverticulosis of transverse through descending colon. No definite evidence of acute diverticulitis. Questionable bowel wall thickening at ascending colon versus artifact from lack of contrast and underdistention. Remaining bowel loops unremarkable for technique. Extensive atherosclerotic calcifications aorta and iliac arteries. Normal appearing bladder and ureters. LEFT inguinal hernia  containing fat. Small umbilical hernia containing fat. No mass, adenopathy, free air or free fluid. Old fractures of the RIGHT superior inferior pubic rami and questionably LEFT sacrum. Diffuse osseous demineralization. IMPRESSION: Hiatal hernia. Wall thickening at gastric antrum/pylorus, could be an artifact related underdistention but mass and inflammatory process such as ulcer disease are not excluded; consider follow-up endoscopy. Questionable wall thickening of ascending colon versus artifact from underdistention, consider followup colonoscopy or barium enema to exclude colonic mass. Colonic diverticulosis without definite evidence of diverticulitis. LEFT inguinal and umbilical hernias containing fat. Electronically Signed   By: Ulyses Southward M.D.   On: 01/28/2016 10:06   I have personally reviewed and evaluated these images and lab results as part of my medical decision-making.   EKG Interpretation None      MDM   Final diagnoses:  Gastroenteritis  Gastrointestinal hemorrhage, unspecified gastritis, unspecified gastrointestinal hemorrhage type    Labs:Urinalysis, urine microscopic, lipase, CMP, CBC  Imaging:  Consults:  Therapeutics:  Discharge Meds:   Assessment/Plan: 75 year old female presents today with likely gastroenteritis. Patient originally had abdominal cramping nausea vomiting and diarrhea. Excessive diarrhea lead II small amount of bright red blood per rectum. Upon my physical exam no frank blood on exam, but was Hemoccult positive. Patient has no history of GI bleed. Repeat H&H showed 12.2 compared to 13.0 approximately 9 hours prior; no significant drop in red blood cells. Patient had no episodes of vomiting while here in the ED, reports significant improvement while here. Due to patient's GI bleed I consult did gastroenterology, they reported that this would not be necessary for hospital admission based on bleed alone and would be adequate for outpatient follow-up. She is  afebrile, nontoxic with reassuring vital signs. She has no elevation in her white blood cells, 0 afebrile, tolerating by mouth without difficulty. CT scan shows no findings that would necessitate further evaluation or management here in the ED. Patient was read CT scan results and instructed for gastroenterology follow-up for bleed and findings on CT. Patient is instructed to immediately call lower GI and schedule follow-up evaluation. She is instructed return immediately to the emergency room if any new or worsening signs or symptoms present. The patient verbalized understanding and agreement to today's plan had no further questions or concerns at the time of discharge. Patient's care was discussed with attending physician Pricilla Loveless who agreed to my assessment plan today.         Eyvonne Mechanic, PA-C 01/28/16 1610  Pricilla Loveless, MD 02/02/16 [phone]

## 2016-01-28 NOTE — ED Notes (Signed)
Pt. reports mid/low abdominal pain with emesis and diarrhea onsett his morning , denies fever or chills . Pt. added fatigue and generalized weakness.

## 2016-01-28 NOTE — Discharge Instructions (Signed)
Please contact gastroenterology today to schedule follow-up evaluation. If you have any new or worsening signs or symptoms please return immediately to the emergency room for further evaluation and management.

## 2016-01-31 DIAGNOSIS — K922 Gastrointestinal hemorrhage, unspecified: Secondary | ICD-10-CM | POA: Diagnosis not present

## 2016-01-31 DIAGNOSIS — Z09 Encounter for follow-up examination after completed treatment for conditions other than malignant neoplasm: Secondary | ICD-10-CM | POA: Diagnosis not present

## 2016-01-31 DIAGNOSIS — K5730 Diverticulosis of large intestine without perforation or abscess without bleeding: Secondary | ICD-10-CM | POA: Diagnosis not present

## 2016-02-05 ENCOUNTER — Encounter: Payer: Self-pay | Admitting: Gastroenterology

## 2016-02-05 ENCOUNTER — Ambulatory Visit (INDEPENDENT_AMBULATORY_CARE_PROVIDER_SITE_OTHER): Payer: Medicare Other | Admitting: Gastroenterology

## 2016-02-05 VITALS — BP 130/78 | HR 59 | Ht 62.0 in | Wt 162.0 lb

## 2016-02-05 DIAGNOSIS — R9389 Abnormal findings on diagnostic imaging of other specified body structures: Secondary | ICD-10-CM

## 2016-02-05 DIAGNOSIS — R938 Abnormal findings on diagnostic imaging of other specified body structures: Secondary | ICD-10-CM

## 2016-02-05 NOTE — Progress Notes (Signed)
02/05/2016 [name] [name] [id number] June 25, 1941   HISTORY OF PRESENT ILLNESS:   This is a 75 year old female who is  Is previously to Dr. Arlyce Dice for colonoscopy in January 2009 at which time she was found have only diverticulosis. She was referred back to our office today at the request of her PCP, Dr. Thea Silversmith , for evaluation regarding an abnormal CT scan of the abdomen that she had performed recently. She says that on Monday, March 13 she was receiving her biologic infusion for her rheumatoid arthritis when she had sudden onset of abdominal cramping followed by loose stool. When she returned home later that day she had progressively worsening diarrhea and abdominal cramping along with nausea and vomiting. She went to the emergency department the next day, March 14, at which time she underwent a CT scan of the abdomen and pelvis with contrast, which revealed the following:  "IMPRESSION: Hiatal hernia.  Wall thickening at gastric antrum/pylorus, could be an artifact related underdistention but mass and inflammatory process such as ulcer disease are not excluded; consider follow-up endoscopy.  Questionable wall thickening of ascending colon versus artifact from underdistention, consider followup colonoscopy or barium enema to exclude colonic mass.  Colonic diverticulosis without definite evidence of diverticulitis.  LEFT inguinal and umbilical hernias containing fat."  She was treated empirically for gastroenteritis and her symptoms subsided, therefore, she was discharged from the emergency department and told to follow up with GI.  Now all of her symptoms have resolved.  She had two episodes of rectal bleeding after her hospital discharge, but no further bleeding since that time.  It was described as bright red blood.   Past Medical History  Diagnosis Date  . CAD (coronary artery disease)     Catheterization 2004, 60% distal LAD, 80% ostial circumflex( not optimal for PCI),  70% small RCA, medical therapy  /  nuclear June, 2005, EF 65%, no ischemia  . Edema   . Drug therapy     Intermittent steroid use  . Rheumatoid arthritis(714.0)     Hospitalization August, 2011, severe RA flare,   . Dyslipidemia   . Statin intolerance   . Ejection fraction     EF 60%, echo, November, 2011, trivial pericardial effusion  . Carotid artery disease (HCC)     Doppler, November, 2011, no significant plaque, distal R. ICA velocities are elevated and could be source of bruit, 0-39% bilateral  . Diabetes mellitus    Past Surgical History  Procedure Laterality Date  . Cholecystectomy    . Abdominal hysterectomy      reports that she quit smoking about 21 years ago. She does not have any smokeless tobacco history on file. Her alcohol and drug histories are not on file. family history includes Diabetes in her mother; Hypertension in her mother; Other in her father. Allergies  Allergen Reactions  . Cholesterol     unknown  . Hydrocodone     REACTION: nausea  . Insulins     unknown  . Metformin And Related     unknown  . Statins     REACTION: joints      Outpatient Encounter Prescriptions as of 02/05/2016  Medication Sig  . Abatacept (ORENCIA) 125 MG/ML SOSY Inject 75 mg into the skin every 30 (thirty) days.  [name] aspirin 81 MG tablet Take 1 tablet (81 mg total) by mouth daily.  . Cholecalciferol (VITAMIN D-3) 1000 units CAPS Take 1 capsule by mouth daily.  . folic acid (FOLVITE) 1  MG tablet Take 1 mg by mouth daily.  . furosemide (LASIX) 40 MG tablet Take 40 mg by mouth daily.  [name] JANUVIA 100 MG tablet Take 100 mg by mouth daily.  [name] losartan (COZAAR) 50 MG tablet Take 50 mg by mouth daily.  . methotrexate (RHEUMATREX) 2.5 MG tablet Take 2.5 mg by mouth. Pt takes 6 tablets once weekly  . Multiple Vitamin (MULTIVITAMIN WITH MINERALS) TABS tablet Take 1 tablet by mouth daily. Centrum silver  . naproxen (NAPROSYN) 500 MG tablet Take 1 tablet (500 mg total) by mouth 2 (two)  times daily with a meal.  . pantoprazole (PROTONIX) 40 MG tablet Take 40 mg by mouth daily.  . predniSONE (DELTASONE) 5 MG tablet Take 5 mg by mouth daily.   No facility-administered encounter medications on file as of 02/05/2016.     REVIEW OF SYSTEMS  : All other systems reviewed and negative except where noted in the History of Present Illness.   PHYSICAL EXAM: BP 130/78 mmHg  Pulse 59  Ht 5\' 2"  (1.575 m)  Wt 162 lb (73.483 kg)  BMI 29.62 kg/m2 General: Well developed black female in no acute distress Head: Normocephalic and atraumatic Eyes:  Sclerae anicteric, conjunctiva pink. Ears: Normal auditory acuity. Lungs: Clear throughout to auscultation Heart: Regular rate and rhythm Abdomen: Soft, non-distended.  BS present.  Non-tender. Rectal:  Will be done at the time of colonoscopy. Musculoskeletal: Symmetrical with no gross deformities  Skin: No lesions on visible extremities Extremities: No edema  Neurological: Alert oriented x 4, grossly non-focal Psychological:  Alert and cooperative. Normal mood and affect  ASSESSMENT AND PLAN: -Acute onset nausea, vomiting, diarrhea, abdominal pain:  All symptoms have now resolved.  ? Infectious gastroenteritis. -Rectal bleeding:  Also now resolved. -Abnormal CT scan showing both possible wall thickening of the gastric antrum/pylorus and questionable wall thickening of the ascending colon.  Last colonoscopy  Was 8 years ago and she has never had an endoscopy. We will schedule her for both procedures with Dr. .  The risks, benefits, and alternatives to EGD and colonoscopy were discussed with the patient and she consents to proceed.    CC:  Myrtie Neither, MD

## 2016-02-05 NOTE — Patient Instructions (Signed)
  You have been scheduled for an endoscopy and colonoscopy. Please follow the written instructions given to you at your visit today. Please pick up your prep supplies at the store. If you use inhalers (even only as needed), please bring them with you on the day of your procedure.   I appreciate the opportunity to care for you.

## 2016-02-08 NOTE — Progress Notes (Signed)
Thank you for sending this case to me. I have reviewed the entire note, and the outlined plan seems appropriate.  

## 2016-02-18 DIAGNOSIS — M8580 Other specified disorders of bone density and structure, unspecified site: Secondary | ICD-10-CM | POA: Diagnosis not present

## 2016-02-18 DIAGNOSIS — M810 Age-related osteoporosis without current pathological fracture: Secondary | ICD-10-CM | POA: Diagnosis not present

## 2016-02-18 DIAGNOSIS — M0589 Other rheumatoid arthritis with rheumatoid factor of multiple sites: Secondary | ICD-10-CM | POA: Diagnosis not present

## 2016-02-18 DIAGNOSIS — M170 Bilateral primary osteoarthritis of knee: Secondary | ICD-10-CM | POA: Diagnosis not present

## 2016-02-18 DIAGNOSIS — Z79899 Other long term (current) drug therapy: Secondary | ICD-10-CM | POA: Diagnosis not present

## 2016-02-21 DIAGNOSIS — E1122 Type 2 diabetes mellitus with diabetic chronic kidney disease: Secondary | ICD-10-CM | POA: Diagnosis not present

## 2016-02-21 DIAGNOSIS — I129 Hypertensive chronic kidney disease with stage 1 through stage 4 chronic kidney disease, or unspecified chronic kidney disease: Secondary | ICD-10-CM | POA: Diagnosis not present

## 2016-02-21 DIAGNOSIS — E785 Hyperlipidemia, unspecified: Secondary | ICD-10-CM | POA: Diagnosis not present

## 2016-02-21 DIAGNOSIS — N182 Chronic kidney disease, stage 2 (mild): Secondary | ICD-10-CM | POA: Diagnosis not present

## 2016-02-24 ENCOUNTER — Telehealth: Payer: Self-pay | Admitting: Gastroenterology

## 2016-02-24 DIAGNOSIS — M0589 Other rheumatoid arthritis with rheumatoid factor of multiple sites: Secondary | ICD-10-CM | POA: Diagnosis not present

## 2016-02-24 NOTE — Telephone Encounter (Signed)
This should not interfere with colonoscopy as planned. Ok from GI standpoint to proceed.

## 2016-02-24 NOTE — Telephone Encounter (Signed)
Pt is having an orencia infusion today and has a colon scheduled for Wed.  The pt is calling to confirm she can keep the appt as scheduled.  I have advised the pt to contact her rheumatologist and get their recommendation and I will also send to Dr Myrtie Neither, I will also send to the Doc of the day, Dr Myrtie Neither is off today.  Dr Russella Dar you are doc of the day can you please advise.

## 2016-02-24 NOTE — Telephone Encounter (Signed)
The pt has been notified of Dr Ardell Isaacs response and she has placed a call to her rheumatologist.

## 2016-02-26 ENCOUNTER — Encounter: Payer: Self-pay | Admitting: Gastroenterology

## 2016-02-26 ENCOUNTER — Ambulatory Visit (AMBULATORY_SURGERY_CENTER): Payer: Medicare Other | Admitting: Gastroenterology

## 2016-02-26 VITALS — BP 156/61 | HR 66 | Temp 98.4°F | Resp 20 | Ht 62.0 in | Wt 162.0 lb

## 2016-02-26 DIAGNOSIS — R933 Abnormal findings on diagnostic imaging of other parts of digestive tract: Secondary | ICD-10-CM | POA: Diagnosis not present

## 2016-02-26 DIAGNOSIS — E119 Type 2 diabetes mellitus without complications: Secondary | ICD-10-CM | POA: Diagnosis not present

## 2016-02-26 DIAGNOSIS — R938 Abnormal findings on diagnostic imaging of other specified body structures: Secondary | ICD-10-CM

## 2016-02-26 DIAGNOSIS — R9389 Abnormal findings on diagnostic imaging of other specified body structures: Secondary | ICD-10-CM

## 2016-02-26 DIAGNOSIS — K222 Esophageal obstruction: Secondary | ICD-10-CM

## 2016-02-26 DIAGNOSIS — K5730 Diverticulosis of large intestine without perforation or abscess without bleeding: Secondary | ICD-10-CM

## 2016-02-26 DIAGNOSIS — I2510 Atherosclerotic heart disease of native coronary artery without angina pectoris: Secondary | ICD-10-CM | POA: Diagnosis not present

## 2016-02-26 LAB — GLUCOSE, CAPILLARY
GLUCOSE-CAPILLARY: 64 mg/dL — AB (ref 65–99)
GLUCOSE-CAPILLARY: 93 mg/dL (ref 65–99)
GLUCOSE-CAPILLARY: 91 mg/dL (ref 65–99)
Glucose-Capillary: 80 mg/dL (ref 65–99)
Glucose-Capillary: 79 mg/dL (ref 65–99)

## 2016-02-26 MED ORDER — SODIUM CHLORIDE 0.9 % IV SOLN: 500.0000 mL | INTRAVENOUS | Status: DC

## 2016-02-26 NOTE — Op Note (Signed)
[facility] Patient Name: [name] [name] Procedure Date: 02/26/2016 2:43 PM MRN: [id number] Endoscopist: Sherilyn Cooter L. Myrtie Neither , MD Age: 75 Date of Birth: [birth year] Gender: Female Procedure:                Upper GI endoscopy Indications:              Abnormal CT of the GI tract (suggesting thickening                            of the proximal gastric wall) Medicines:                Monitored Anesthesia Care Procedure:                Pre-Anesthesia Assessment:                           - Prior to the procedure, a History and Physical                            was performed, and patient medications and                            allergies were reviewed. The patient's tolerance of                            previous anesthesia was also reviewed. The risks                            and benefits of the procedure and the sedation                            options and risks were discussed with the patient.                            All questions were answered, and informed consent                            was obtained. Prior Anticoagulants: The patient has                            taken no previous anticoagulant or antiplatelet                            agents. ASA Grade Assessment: II - A patient with                            mild systemic disease. After reviewing the risks                            and benefits, the patient was deemed in                            satisfactory condition to undergo the procedure.  After obtaining informed consent, the endoscope was                            passed under direct vision. Throughout the                            procedure, the patient's blood pressure, pulse, and                            oxygen saturations were monitored continuously. The                            Model GIF-[id number]) scope was introduced                            through the mouth, and advanced to the second part                   of duodenum. The upper GI endoscopy was                            accomplished without difficulty. The patient                            tolerated the procedure well. 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 Just the passage of the scope with mild                            resistance dilated the web.                           The stomach was normal.                           The examined duodenum was normal.                           A 5 cm hiatal hernia was present. Complications:            No immediate complications. Estimated Blood Loss:     Estimated blood loss: none. Impression:               - Web in the upper third of the esophagus.                           - Normal stomach.                           - Normal examined duodenum.                           - 5 cm hiatal hernia.                           -  No specimens collected. Recommendation:           - Patient has a contact number available for                            emergencies. The signs and symptoms of potential                            delayed complications were discussed with the                            patient. Return to normal activities tomorrow.                            Written discharge instructions were provided to the                            patient.                           - Resume previous diet.                           - Continue present medications.                           - No routine repeat upper endoscopy. Cardarius Senat L. Myrtie Neither, MD 02/26/2016 3:10:29 PM This report has been signed electronically.

## 2016-02-26 NOTE — Patient Instructions (Signed)
Impressions/recommendations:  Endoscopy:  Hiatal hernia (handout given) Esophageal web  Colonoscopy:  Diverticulosis (handout given) High Fiber diet (handout given)  YOU HAD AN ENDOSCOPIC PROCEDURE TODAY AT THE [facility]:   Refer to the procedure report that was given to you for any specific questions about what was found during the examination.  If the procedure report does not answer your questions, please call your gastroenterologist to clarify.  If you requested that your care partner not be given the details of your procedure findings, then the procedure report has been included in a sealed envelope for you to review at your convenience later.  YOU SHOULD EXPECT: Some feelings of bloating in the abdomen. Passage of more gas than usual.  Walking can help get rid of the air that was put into your GI tract during the procedure and reduce the bloating. If you had a lower endoscopy (such as a colonoscopy or flexible sigmoidoscopy) you may notice spotting of blood in your stool or on the toilet paper. If you underwent a bowel prep for your procedure, you may not have a normal bowel movement for a few days.  Please Note:  You might notice some irritation and congestion in your nose or some drainage.  This is from the oxygen used during your procedure.  There is no need for concern and it should clear up in a day or so.  SYMPTOMS TO REPORT IMMEDIATELY:   Following lower endoscopy (colonoscopy or flexible sigmoidoscopy):  Excessive amounts of blood in the stool  Significant tenderness or worsening of abdominal pains  Swelling of the abdomen that is new, acute  Fever of 100F or higher   Following upper endoscopy (EGD)  Vomiting of blood or coffee ground material  New chest pain or pain under the shoulder blades  Painful or persistently difficult swallowing  New shortness of breath  Fever of 100F or higher  Black, tarry-looking stools  For urgent or emergent issues, a  gastroenterologist can be reached at any hour by calling [phone].   DIET: Your first meal following the procedure should be a small meal and then it is ok to progress to your normal diet. Heavy or fried foods are harder to digest and may make you feel nauseous or bloated.  Likewise, meals heavy in dairy and vegetables can increase bloating.  Drink plenty of fluids but you should avoid alcoholic beverages for 24 hours.  ACTIVITY:  You should plan to take it easy for the rest of today and you should NOT DRIVE or use heavy machinery until tomorrow (because of the sedation medicines used during the test).    FOLLOW UP: Our staff will call the number listed on your records the next business day following your procedure to check on you and address any questions or concerns that you may have regarding the information given to you following your procedure. If we do not reach you, we will leave a message.  However, if you are feeling well and you are not experiencing any problems, there is no need to return our call.  We will assume that you have returned to your regular daily activities without incident.  If any biopsies were taken you will be contacted by phone or by letter within the next 1-3 weeks.  Please call us at [phone] if you have not heard about the biopsies in 3 weeks.    SIGNATURES/CONFIDENTIALITY: You and/or your care partner have signed paperwork which will be entered into your electronic  medical record.  These signatures attest to the fact that that the information above on your After Visit Summary has been reviewed and is understood.  Full responsibility of the confidentiality of this discharge information lies with you and/or your care-partner.

## 2016-02-26 NOTE — Progress Notes (Signed)
Rechecked patients blood glucose at 1537 - 93 results.

## 2016-02-26 NOTE — Progress Notes (Signed)
A/ox3, pleased with MAC, report to RN 

## 2016-02-26 NOTE — Op Note (Signed)
[facility] Patient Name: [name] [name] Procedure Date: 02/26/2016 2:51 PM MRN: [id number] Endoscopist: Sherilyn Cooter L. Myrtie Neither , MD Age: 75 Date of Birth: 02-27-1941 Gender: Female Procedure:                Colonoscopy Indications:              Abnormal CT of the GI tract (suggesting thickening                            of the wall in the ascending colon) Medicines:                Monitored Anesthesia Care Procedure:                Pre-Anesthesia Assessment:                           - Prior to the procedure, a History and Physical                            was performed, and patient medications and                            allergies were reviewed. The patient's tolerance of                            previous anesthesia was also reviewed. The risks                            and benefits of the procedure and the sedation                            options and risks were discussed with the patient.                            All questions were answered, and informed consent                            was obtained. Prior Anticoagulants: The patient has                            taken no previous anticoagulant or antiplatelet                            agents. ASA Grade Assessment: II - A patient with                            mild systemic disease. After reviewing the risks                            and benefits, the patient was deemed in                            satisfactory condition to undergo the procedure.  After obtaining informed consent, the colonoscope                            was passed under direct vision. Throughout the                            procedure, the patient's blood pressure, pulse, and                            oxygen saturations were monitored continuously. The                            Model CF-[id number]) scope was introduced                            through the anus and advanced to the the cecum,                 identified by appendiceal orifice and ileocecal                            valve. The ileocecal valve, appendiceal orifice,                            and rectum were photographed. The quality of the                            bowel preparation was good. The colonoscopy was                            performed without difficulty. The patient tolerated                            the procedure well. The bowel preparation used was                            Miralax. Scope In: 2:52:58 PM Scope Out: 3:02:26 PM Scope Withdrawal Time: 0 hours 5 minutes 34 seconds  Total Procedure Duration: 0 hours 9 minutes 28 seconds  Findings:                 Multiple medium-mouthed diverticula were found in                            the sigmoid colon and ascending colon.                           The exam was otherwise without abnormality on                            direct and retroflexion views. Complications:            No immediate complications. Estimated blood loss:                            None. Estimated Blood Loss:     Estimated blood loss:  none. Impression:               - Diverticulosis in the sigmoid colon and in the                            ascending colon.                           - The examination was otherwise normal on direct                            and retroflexion views.                           - No specimens collected. Recommendation:           - Patient has a contact number available for                            emergencies. The signs and symptoms of potential                            delayed complications were discussed with the                            patient. Return to normal activities tomorrow.                            Written discharge instructions were provided to the                            patient.                           - Resume previous diet.                           - Continue present medications.                           - No  repeat colonoscopy due to age. Henry L. Myrtie Neither, MD 02/26/2016 3:14:35 PM This report has been signed electronically.

## 2016-02-27 ENCOUNTER — Telehealth: Payer: Self-pay | Admitting: *Deleted

## 2016-02-27 NOTE — Telephone Encounter (Signed)
  Follow up Call-  Call back number 02/26/2016  Post procedure Call Back phone  # [phone]  Permission to leave phone message Yes     Patient questions:  Do you have a fever, pain , or abdominal swelling? No. Pain Score  0 *  Have you tolerated food without any problems? Yes.    Have you been able to return to your normal activities? Yes.    Do you have any questions about your discharge instructions: Diet   No. Medications  No. Follow up visit  No.  Do you have questions or concerns about your Care? No.  Actions: * If pain score is 4 or above: No action needed, pain <4.

## 2016-03-19 DIAGNOSIS — M170 Bilateral primary osteoarthritis of knee: Secondary | ICD-10-CM | POA: Diagnosis not present

## 2016-03-19 DIAGNOSIS — M0589 Other rheumatoid arthritis with rheumatoid factor of multiple sites: Secondary | ICD-10-CM | POA: Diagnosis not present

## 2016-03-19 DIAGNOSIS — M8580 Other specified disorders of bone density and structure, unspecified site: Secondary | ICD-10-CM | POA: Diagnosis not present

## 2016-03-19 DIAGNOSIS — M810 Age-related osteoporosis without current pathological fracture: Secondary | ICD-10-CM | POA: Diagnosis not present

## 2016-03-23 DIAGNOSIS — M0589 Other rheumatoid arthritis with rheumatoid factor of multiple sites: Secondary | ICD-10-CM | POA: Diagnosis not present

## 2016-04-16 ENCOUNTER — Other Ambulatory Visit: Payer: Self-pay | Admitting: Internal Medicine

## 2016-04-16 DIAGNOSIS — R921 Mammographic calcification found on diagnostic imaging of breast: Secondary | ICD-10-CM

## 2016-04-21 DIAGNOSIS — M0589 Other rheumatoid arthritis with rheumatoid factor of multiple sites: Secondary | ICD-10-CM | POA: Diagnosis not present

## 2016-04-27 ENCOUNTER — Ambulatory Visit
Admission: RE | Admit: 2016-04-27 | Discharge: 2016-04-27 | Disposition: A | Discharge status: Home / Self Care | Payer: Medicare Other | Source: Ambulatory Visit | Attending: Internal Medicine | Admitting: Internal Medicine

## 2016-04-27 DIAGNOSIS — R921 Mammographic calcification found on diagnostic imaging of breast: Secondary | ICD-10-CM | POA: Diagnosis not present

## 2016-05-21 DIAGNOSIS — M0589 Other rheumatoid arthritis with rheumatoid factor of multiple sites: Secondary | ICD-10-CM | POA: Diagnosis not present

## 2016-06-19 DIAGNOSIS — I129 Hypertensive chronic kidney disease with stage 1 through stage 4 chronic kidney disease, or unspecified chronic kidney disease: Secondary | ICD-10-CM | POA: Diagnosis not present

## 2016-06-19 DIAGNOSIS — E785 Hyperlipidemia, unspecified: Secondary | ICD-10-CM | POA: Diagnosis not present

## 2016-06-19 DIAGNOSIS — M810 Age-related osteoporosis without current pathological fracture: Secondary | ICD-10-CM | POA: Diagnosis not present

## 2016-06-19 DIAGNOSIS — E559 Vitamin D deficiency, unspecified: Secondary | ICD-10-CM | POA: Diagnosis not present

## 2016-06-19 DIAGNOSIS — E1165 Type 2 diabetes mellitus with hyperglycemia: Secondary | ICD-10-CM | POA: Diagnosis not present

## 2016-06-22 DIAGNOSIS — M810 Age-related osteoporosis without current pathological fracture: Secondary | ICD-10-CM | POA: Diagnosis not present

## 2016-06-22 DIAGNOSIS — Z0000 Encounter for general adult medical examination without abnormal findings: Secondary | ICD-10-CM | POA: Diagnosis not present

## 2016-06-22 DIAGNOSIS — E663 Overweight: Secondary | ICD-10-CM | POA: Diagnosis not present

## 2016-06-22 DIAGNOSIS — Z1389 Encounter for screening for other disorder: Secondary | ICD-10-CM | POA: Diagnosis not present

## 2016-06-22 DIAGNOSIS — M0589 Other rheumatoid arthritis with rheumatoid factor of multiple sites: Secondary | ICD-10-CM | POA: Diagnosis not present

## 2016-06-22 DIAGNOSIS — M170 Bilateral primary osteoarthritis of knee: Secondary | ICD-10-CM | POA: Diagnosis not present

## 2016-06-22 DIAGNOSIS — E1122 Type 2 diabetes mellitus with diabetic chronic kidney disease: Secondary | ICD-10-CM | POA: Diagnosis not present

## 2016-06-22 DIAGNOSIS — M8580 Other specified disorders of bone density and structure, unspecified site: Secondary | ICD-10-CM | POA: Diagnosis not present

## 2016-06-23 DIAGNOSIS — M0589 Other rheumatoid arthritis with rheumatoid factor of multiple sites: Secondary | ICD-10-CM | POA: Diagnosis not present

## 2016-06-29 ENCOUNTER — Other Ambulatory Visit: Payer: Self-pay | Admitting: Internal Medicine

## 2016-06-29 DIAGNOSIS — N182 Chronic kidney disease, stage 2 (mild): Secondary | ICD-10-CM | POA: Diagnosis not present

## 2016-06-29 DIAGNOSIS — I209 Angina pectoris, unspecified: Secondary | ICD-10-CM

## 2016-06-29 DIAGNOSIS — E1122 Type 2 diabetes mellitus with diabetic chronic kidney disease: Secondary | ICD-10-CM | POA: Diagnosis not present

## 2016-06-29 DIAGNOSIS — I129 Hypertensive chronic kidney disease with stage 1 through stage 4 chronic kidney disease, or unspecified chronic kidney disease: Secondary | ICD-10-CM | POA: Diagnosis not present

## 2016-06-29 DIAGNOSIS — R079 Chest pain, unspecified: Secondary | ICD-10-CM | POA: Diagnosis not present

## 2016-07-08 ENCOUNTER — Ambulatory Visit (INDEPENDENT_AMBULATORY_CARE_PROVIDER_SITE_OTHER): Payer: Medicare Other

## 2016-07-08 ENCOUNTER — Encounter (INDEPENDENT_AMBULATORY_CARE_PROVIDER_SITE_OTHER): Payer: Self-pay

## 2016-07-08 DIAGNOSIS — I209 Angina pectoris, unspecified: Secondary | ICD-10-CM

## 2016-07-08 LAB — EXERCISE TOLERANCE TEST
CHL CUP RESTING HR STRESS: 65 {beats}/min
CHL RATE OF PERCEIVED EXERTION: 16
CSEPED: 5 min
CSEPEW: 7 METS
Exercise duration (sec): 31 s
MPHR: 145 {beats}/min
Peak HR: 126 {beats}/min
Percent HR: 86 %

## 2016-07-10 ENCOUNTER — Other Ambulatory Visit: Payer: Self-pay | Admitting: Internal Medicine

## 2016-07-10 ENCOUNTER — Other Ambulatory Visit: Payer: Self-pay

## 2016-07-10 DIAGNOSIS — I209 Angina pectoris, unspecified: Secondary | ICD-10-CM

## 2016-07-13 ENCOUNTER — Ambulatory Visit (INDEPENDENT_AMBULATORY_CARE_PROVIDER_SITE_OTHER): Payer: Medicare Other | Admitting: Cardiovascular Disease

## 2016-07-13 ENCOUNTER — Encounter (INDEPENDENT_AMBULATORY_CARE_PROVIDER_SITE_OTHER): Payer: Self-pay

## 2016-07-13 ENCOUNTER — Encounter: Payer: Self-pay | Admitting: Cardiovascular Disease

## 2016-07-13 VITALS — BP 116/84 | HR 60 | Ht 62.0 in | Wt 166.0 lb

## 2016-07-13 DIAGNOSIS — I209 Angina pectoris, unspecified: Secondary | ICD-10-CM | POA: Diagnosis not present

## 2016-07-13 DIAGNOSIS — I2510 Atherosclerotic heart disease of native coronary artery without angina pectoris: Secondary | ICD-10-CM

## 2016-07-13 NOTE — Patient Instructions (Signed)
Medication Instructions:  Your physician recommends that you continue on your current medications as directed. Please refer to the Current Medication list given to you today.   Labwork: None ordered  Testing/Procedures: None ordered  Follow-Up: Your physician wants you to follow-up in 1 year with Dr. Nahser. You will receive a reminder letter in the mail two months in advance. If you don't receive a letter, please call our office to schedule an appointment.     If you need a refill on your cardiac medications before your next appointment, please call your pharmacy.   
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
## 2016-07-15 ENCOUNTER — Encounter (HOSPITAL_COMMUNITY): Payer: Medicare Other

## 2016-07-21 DIAGNOSIS — M0589 Other rheumatoid arthritis with rheumatoid factor of multiple sites: Secondary | ICD-10-CM | POA: Diagnosis not present

## 2016-08-12 DIAGNOSIS — M810 Age-related osteoporosis without current pathological fracture: Secondary | ICD-10-CM | POA: Diagnosis not present

## 2016-08-19 DIAGNOSIS — M0589 Other rheumatoid arthritis with rheumatoid factor of multiple sites: Secondary | ICD-10-CM | POA: Diagnosis not present

## 2016-08-24 DIAGNOSIS — N182 Chronic kidney disease, stage 2 (mild): Secondary | ICD-10-CM | POA: Diagnosis not present

## 2016-08-24 DIAGNOSIS — I129 Hypertensive chronic kidney disease with stage 1 through stage 4 chronic kidney disease, or unspecified chronic kidney disease: Secondary | ICD-10-CM | POA: Diagnosis not present

## 2016-08-24 DIAGNOSIS — E785 Hyperlipidemia, unspecified: Secondary | ICD-10-CM | POA: Diagnosis not present

## 2016-08-24 DIAGNOSIS — E1122 Type 2 diabetes mellitus with diabetic chronic kidney disease: Secondary | ICD-10-CM | POA: Diagnosis not present

## 2016-09-16 DIAGNOSIS — M0589 Other rheumatoid arthritis with rheumatoid factor of multiple sites: Secondary | ICD-10-CM | POA: Diagnosis not present

## 2016-10-02 DIAGNOSIS — Z23 Encounter for immunization: Secondary | ICD-10-CM | POA: Diagnosis not present

## 2016-10-15 DIAGNOSIS — M0589 Other rheumatoid arthritis with rheumatoid factor of multiple sites: Secondary | ICD-10-CM | POA: Diagnosis not present

## 2016-11-11 ENCOUNTER — Other Ambulatory Visit: Payer: Self-pay | Admitting: Internal Medicine

## 2016-11-11 DIAGNOSIS — R921 Mammographic calcification found on diagnostic imaging of breast: Secondary | ICD-10-CM

## 2016-11-11 DIAGNOSIS — E119 Type 2 diabetes mellitus without complications: Secondary | ICD-10-CM | POA: Diagnosis not present

## 2016-11-12 DIAGNOSIS — M0589 Other rheumatoid arthritis with rheumatoid factor of multiple sites: Secondary | ICD-10-CM | POA: Diagnosis not present

## 2016-11-16 ENCOUNTER — Emergency Department (HOSPITAL_COMMUNITY): Payer: Medicare Other

## 2016-11-16 ENCOUNTER — Encounter (HOSPITAL_COMMUNITY): Payer: Self-pay

## 2016-11-16 ENCOUNTER — Emergency Department (HOSPITAL_COMMUNITY)
Admission: EM | Admit: 2016-11-16 | Discharge: 2016-11-16 | Disposition: A | Discharge status: Home / Self Care | Payer: Medicare Other | Attending: Emergency Medicine | Admitting: Emergency Medicine

## 2016-11-16 DIAGNOSIS — Z79899 Other long term (current) drug therapy: Secondary | ICD-10-CM | POA: Insufficient documentation

## 2016-11-16 DIAGNOSIS — M25512 Pain in left shoulder: Secondary | ICD-10-CM | POA: Diagnosis not present

## 2016-11-16 DIAGNOSIS — Z87891 Personal history of nicotine dependence: Secondary | ICD-10-CM | POA: Insufficient documentation

## 2016-11-16 DIAGNOSIS — Z7982 Long term (current) use of aspirin: Secondary | ICD-10-CM | POA: Diagnosis not present

## 2016-11-16 DIAGNOSIS — E119 Type 2 diabetes mellitus without complications: Secondary | ICD-10-CM | POA: Diagnosis not present

## 2016-11-16 DIAGNOSIS — I2510 Atherosclerotic heart disease of native coronary artery without angina pectoris: Secondary | ICD-10-CM | POA: Diagnosis not present

## 2016-11-16 DIAGNOSIS — M25511 Pain in right shoulder: Secondary | ICD-10-CM | POA: Insufficient documentation

## 2016-11-16 DIAGNOSIS — R079 Chest pain, unspecified: Secondary | ICD-10-CM | POA: Diagnosis not present

## 2016-11-16 LAB — COMPREHENSIVE METABOLIC PANEL
ALT: 19 U/L (ref 14–54)
AST: 23 U/L (ref 15–41)
Albumin: 3.1 g/dL — ABNORMAL LOW (ref 3.5–5.0)
Alkaline Phosphatase: 52 U/L (ref 38–126)
Anion gap: 10 (ref 5–15)
BUN: 14 mg/dL (ref 6–20)
CHLORIDE: 106 mmol/L (ref 101–111)
CO2: 22 mmol/L (ref 22–32)
Calcium: 9.4 mg/dL (ref 8.9–10.3)
Creatinine, Ser: 0.91 mg/dL (ref 0.44–1.00)
GFR calc non Af Amer: >60 mL/min (ref >60)
Glucose, Bld: 207 mg/dL — ABNORMAL HIGH (ref 65–99)
POTASSIUM: 3.6 mmol/L (ref 3.5–5.1)
SODIUM: 138 mmol/L (ref 135–145)
Total Bilirubin: 0.6 mg/dL (ref 0.3–1.2)
Total Protein: 6.6 g/dL (ref 6.5–8.1)

## 2016-11-16 LAB — CBC WITH DIFFERENTIAL/PLATELET
BASOS ABS: 0 10*3/uL (ref 0.0–0.1)
Basophils Relative: 0 %
EOS ABS: 0.2 10*3/uL (ref 0.0–0.7)
EOS PCT: 2 %
HCT: 34.5 % — ABNORMAL LOW (ref 36.0–46.0)
Hemoglobin: 11.5 g/dL — ABNORMAL LOW (ref 12.0–15.0)
LYMPHS ABS: 1.7 10*3/uL (ref 0.7–4.0)
LYMPHS PCT: 23 %
MCH: 29.9 pg (ref 26.0–34.0)
MCHC: 33.3 g/dL (ref 30.0–36.0)
MCV: 89.8 fL (ref 78.0–100.0)
MONO ABS: 0.6 10*3/uL (ref 0.1–1.0)
Monocytes Relative: 8 %
Neutro Abs: 4.9 10*3/uL (ref 1.7–7.7)
Neutrophils Relative %: 67 %
PLATELETS: 203 10*3/uL (ref 150–400)
RBC: 3.84 MIL/uL — ABNORMAL LOW (ref 3.87–5.11)
RDW: 15 % (ref 11.5–15.5)
WBC: 7.4 10*3/uL (ref 4.0–10.5)

## 2016-11-16 LAB — I-STAT TROPONIN, ED: TROPONIN I, POC: 0.01 ng/mL (ref 0.00–0.08)

## 2016-11-16 MED ORDER — ONDANSETRON HCL 4 MG/2ML IJ SOLN
4.0000 mg | Freq: Once | INTRAMUSCULAR | Status: AC
  Administered 2016-11-16: 4 mg via INTRAVENOUS
  Filled 2016-11-16: qty 2

## 2016-11-16 MED ORDER — FENTANYL CITRATE (PF) 100 MCG/2ML IJ SOLN
50.0000 ug | Freq: Once | INTRAMUSCULAR | Status: AC
  Administered 2016-11-16: 50 ug via INTRAVENOUS
  Filled 2016-11-16: qty 2

## 2016-11-16 NOTE — ED Triage Notes (Addendum)
Pt from home by [facility] for left shoulder pain and chest discomfort. Pt states it is similar to how she felt when she had an MI in 1997. Pt had 324 mg ASA with EMS and 1 nitro
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
## 2016-11-16 NOTE — ED Notes (Signed)
Patient transported to X-ray 

## 2016-11-30 ENCOUNTER — Ambulatory Visit
Admission: RE | Admit: 2016-11-30 | Discharge: 2016-11-30 | Disposition: A | Discharge status: Home / Self Care | Payer: Medicare Other | Source: Ambulatory Visit | Attending: Internal Medicine | Admitting: Internal Medicine

## 2016-11-30 DIAGNOSIS — R921 Mammographic calcification found on diagnostic imaging of breast: Secondary | ICD-10-CM

## 2017-02-10 ENCOUNTER — Telehealth: Payer: Self-pay | Admitting: Cardiovascular Disease

## 2017-02-10 NOTE — Telephone Encounter (Signed)
New Message  Pt voiced she doesn't feel right and would like for nurse to give her a call.  Pt voiced she thinks its heart related but the same feeling she had when she went to ED it wasn't.  Offered appt today at 1145 am pt declined.  Please f/u with pt

## 2017-02-10 NOTE — Telephone Encounter (Signed)
Spoke with patient who states she is having pain like she had in 1997 when she had a heart attack. She went to the ED on 11/16/16 with the same pain and was told the pain was not cardiac related. She states the pain is across her shoulders and into her arms. She states she vomits with pain and has had approximately 3 more episodes of this pain with vomiting since the first of January. She states she has taken Protonix to see if this helps and she has not had any problems since Sunday. She reports SOB with walking recently. She does not have NTG at home. I scheduled her to see Demetra Shiner, PA tomorrow at 11:30. She verbalized understanding and agreement with plan.

## 2017-02-11 ENCOUNTER — Encounter (INDEPENDENT_AMBULATORY_CARE_PROVIDER_SITE_OTHER): Payer: Self-pay

## 2017-02-11 ENCOUNTER — Encounter: Payer: Self-pay | Admitting: Physician Assistant

## 2017-02-11 ENCOUNTER — Ambulatory Visit (INDEPENDENT_AMBULATORY_CARE_PROVIDER_SITE_OTHER): Payer: Medicare Other | Admitting: Physician Assistant

## 2017-02-11 VITALS — BP 128/70 | HR 81 | Ht 62.0 in | Wt 161.0 lb

## 2017-02-11 DIAGNOSIS — M25512 Pain in left shoulder: Secondary | ICD-10-CM | POA: Diagnosis not present

## 2017-02-11 DIAGNOSIS — R079 Chest pain, unspecified: Secondary | ICD-10-CM

## 2017-02-11 DIAGNOSIS — I2510 Atherosclerotic heart disease of native coronary artery without angina pectoris: Secondary | ICD-10-CM

## 2017-02-11 DIAGNOSIS — E785 Hyperlipidemia, unspecified: Secondary | ICD-10-CM | POA: Diagnosis not present

## 2017-02-11 MED ORDER — NITROGLYCERIN 0.4 MG SL SUBL: 0.4000 mg | SUBLINGUAL_TABLET | SUBLINGUAL | 3 refills | Status: DC | PRN

## 2017-02-11 NOTE — Patient Instructions (Addendum)
Medication Instructions:  Start Nitroglycerin Sublingual 0.4 mg every 5 minutes for up to three doses AS NEEDED for chest pain.  Labwork: None ordered  Testing/Procedures: None ordered  Follow-Up: Your physician recommends that you schedule a follow-up appointment in: 3 months with Dr. Elease Hashimoto.   Any Other Special Instructions Will Be Listed Below (If Applicable).     If you need a refill on your cardiac medications before your next appointment, please call your pharmacy.

## 2017-02-11 NOTE — Progress Notes (Signed)
Cardiology Office Note    Date:  02/11/2017   ID:  [name] [name], [name] [name] 1940-12-27, MRN [id number]  PCP:  Thayer Headings, MD  Cardiologist:  Dr. Elease Hashimoto (previous patient of Dr. Myrtis Ser)  Chief Complaint: CP/shoulder pain  History of Present Illness:   [name] [name] is a 76 y.o. female CAD s/p myocardial infarction with angioplasty to an occluded circumflex in August of 1997 (Last cath in 2004 as below --> medical management), carotid artery disease, HLD, DM, and RA presents for chest pain evaluation.   Exercise tolerance test 07/08/2016 showed   Blood pressure demonstrated a normal response to exercise.  Horizontal ST segment depression ST segment depression of 1 mm was noted during stress in the V5 and V6 leads.   Patient only achieved 86% of PMHR. 1 mm Horizontal ST segment depression in lateral leads peak stress and recovery  Suggest f/u perfusion imaging.  The patient was seen by Dr. Elease Hashimoto 07/13/16 for follow up who did not felt result was suggestive of ischemia.it was borderline at the most and she had some baseline ST changes that would make it difficult to interpret.  She walks 3 times a week for at least 30 minutes a day and does not have any angina with her walking.   Presents for evaluation of left shoulder pain for the past 2 months. This been intermittent and described as stabbing in character. Occurs with and without  Exertion. Patient denies any chest pain/pressure. However her shoulder pain is similar to her prior cardiac pain when she had a MI in 1997. She did not had any chest pain at that time. She has not been exercising/walking due to weather. For the past 2 weeks he tried to walk however noted shortness of breath and fatigue. No chest pressure. She denies any orthopnea, PND, syncope, lower extremity edema, melena or blood in her stool or urine.    Past Medical History:  Diagnosis Date  . CAD (coronary artery disease)    Catheterization 2004, 60% distal LAD,  80% ostial circumflex( not optimal for PCI), 70% small RCA, medical therapy  /  nuclear June, 2005, EF 65%, no ischemia  . Carotid artery disease (HCC)    Doppler, November, 2011, no significant plaque, distal R. ICA velocities are elevated and could be source of bruit, 0-39% bilateral  . Diabetes mellitus   . Drug therapy    Intermittent steroid use  . Dyslipidemia   . Edema   . Ejection fraction    EF 60%, echo, November, 2011, trivial pericardial effusion  . Rheumatoid arthritis(714.0)    Hospitalization August, 2011, severe RA flare,   . Statin intolerance     Past Surgical History:  Procedure Laterality Date  . ABDOMINAL HYSTERECTOMY    . CHOLECYSTECTOMY      Current Medications: Prior to Admission medications   Medication Sig Start Date End Date Taking? Authorizing Provider  Abatacept (ORENCIA) 125 MG/ML SOSY Inject 75 mg into the skin every 30 (thirty) days.    Historical Provider, MD  aspirin 81 MG tablet Take 1 tablet (81 mg total) by mouth daily. 07/26/13   Luis Abed, MD  Cholecalciferol (VITAMIN D-3) 1000 units CAPS Take 1 capsule by mouth daily.    Historical Provider, MD  folic acid (FOLVITE) 1 MG tablet Take 1 mg by mouth daily. Reported on 02/26/2016    Historical Provider, MD  furosemide (LASIX) 40 MG tablet Take 40 mg by mouth daily. Reported on 02/26/2016 04/09/15   Historical  Provider, MD  JANUVIA 100 MG tablet Take 100 mg by mouth daily. 04/25/15   Historical Provider, MD  losartan (COZAAR) 50 MG tablet Take 50 mg by mouth daily. 04/22/15   Historical Provider, MD  methotrexate (RHEUMATREX) 2.5 MG tablet Take 15 mg by mouth once a week. On Mondays 04/14/15   Historical Provider, MD  Multiple Vitamin (MULTIVITAMIN WITH MINERALS) TABS tablet Take 1 tablet by mouth daily. Centrum silver    Historical Provider, MD  naproxen (NAPROSYN) 500 MG tablet Take 1 tablet (500 mg total) by mouth 2 (two) times daily with a meal. Patient not taking: Reported on 11/16/2016 07/17/14    Fayrene Helper, PA-C  pantoprazole (PROTONIX) 40 MG tablet Take 40 mg by mouth daily.    Historical Provider, MD    Allergies:   Cholesterol; Hydrocodone; Insulins; Metformin and related; and Statins   Social History   Social History  . Marital status: Married    Spouse name: N/A  . Number of children: N/A  . Years of education: N/A   Social History Main Topics  . Smoking status: Former Smoker    Quit date: 11/16/1994  . Smokeless tobacco: Never Used  . Alcohol use No  . Drug use: No  . Sexual activity: Not Asked   Other Topics Concern  . None   Social History Narrative  . None     Family History:  The patient's family history includes Diabetes in her mother; Hypertension in her mother; Other in her father.  ROS:   Please see the history of present illness.    ROS All other systems reviewed and are negative.   PHYSICAL EXAM:   VS:  BP 128/70   Pulse 81   Ht 5\' 2"  (1.575 m)   Wt 161 lb (73 kg)   SpO2 96%   BMI 29.45 kg/m    GEN: Well nourished, well developed, in no acute distress  HEENT: normal  Neck: no JVD, carotid bruits, or masses Cardiac: RRR; no murmurs, rubs, or gallops,no edema  Respiratory:  clear to auscultation bilaterally, normal work of breathing GI: soft, nontender, nondistended, + BS MS: no deformity or atrophy  Skin: warm and dry, no rash Neuro:  Alert and Oriented x 3, Strength and sensation are intact Psych: euthymic mood, full affect  Wt Readings from Last 3 Encounters:  02/11/17 161 lb (73 kg)  11/16/16 164 lb (74.4 kg)  07/13/16 166 lb (75.3 kg)      Studies/Labs Reviewed:   EKG:  EKG is ordered today.  The ekg ordered today demonstrates NSR.   Recent Labs: 11/16/2016: ALT 19; BUN 14; Creatinine, Ser 0.91; Hemoglobin 11.5; Platelets 203; Potassium 3.6; Sodium 138   Lipid Panel No results found for: CHOL, TRIG, HDL, CHOLHDL, VLDL, LDLCALC, LDLDIRECT  Additional studies/ records that were reviewed today include:   Echocardiogram:  04/2015 Study Conclusions  - Left ventricle: The cavity size was normal. Systolic function was   normal. The estimated ejection fraction was in the range of 55%   to 60%. Wall motion was normal; there were no regional wall   motion abnormalities. Doppler parameters are consistent with   abnormal left ventricular relaxation (grade 1 diastolic   dysfunction).  Impressions:  - Compared to the prior study, there has been no significant   interval change.   Cardiac Catheterization:  12/2002 ANGIOGRAPHIC FINDINGS:  1. Left main coronary artery has a 20% distal stenosis.  2. The left anterior descending has scattered 30% stenoses in the  proximal-     to-mid-vessel segment with a 60% distal stenosis.  3. The circumflex coronary artery has an 80% ostial stenosis followed by 30%     mid-to-distal stenosis in the previous area of intervention.  4. The right coronary artery is a fairly small vessel with a large right     ventricular marginal branch that has an 80% stenosis proximally.  Within     the RCA, there are 75% and 80% proximal stenoses followed by a more     distal 60% stenosis.   LEFT VENTRICULOGRAPHY:  Left ventriculography was performed in the RAO  projection and revealed an ejection fraction of 65-70% with trace to 1+  mitral regurgitation.    DIAGNOSES:  1. Multi-vessel coronary artery disease as outlined.  The most significant     stenoses include a 60% distal left anterior descending stenosis, 80%     ostial circumflex stenosis, and 75% to 80% proximal right coronary artery     stenoses.  2. Left ventricular ejection fraction is 65-70% with trace to 1+ mitral     regurgitation.   RECOMMENDATIONS:  I discussed the films initially with Dr. Arturo Morton.  Stuckey, who plans to review these in more detail with Dr. Willa Rough  later today.  We will, in the meanwhile, add Norvasc to the patient's  medical regimen which already includes Imdur and metoprolol.  We will   anticipate discharge home today with close followup next week with Dr. Myrtis Ser  to discuss further options regarding potential revascularization    ASSESSMENT & PLAN:    1. Left shoulder/neck pain.  -Intermittent for the past 2 months. Occurs with and without exertion. She hasn't tried any sublingual nitroglycerin. The patient states this this pain is similar to when she had a MI in 1997 and treated with angioplasty of circumflex. - He does have a history of arthritis on her shoulder and last evaluation in 2015. Patient states that her PCP is sick and seen by PA/NP few months ago. No workup recommended at that time. - Her symptoms is atypical however could be anginal equivalent given prior history. She did notice recent exertional fatigue and dyspnea. EKG without acute abnormality. Discuss stress test versus cardiac catheterization. Patient will let us know. She will also follow-up with primary care provider regarding evaluation of her shoulder pain. She did have a history of rheumatoid arthritis as well. - Trial of sublingual nitroglycerin.  2. CAD - Last Cath in 2004 showed 60% distal left anterior descending stenosis, 80% ostial circumflex stenosis, and 75% to 80% proximal right coronary artery stenoses. Managed medically.  -  Exercise tolerance test in 06/2016 was abnormal. However Dr. Elease Hashimoto did not felt she has any ischemia given 30 mins of exercise 3 times/week.  - Given up to it is worthwhile to pursue some kind of ischemic evaluation.  3. HLD - Managed by PCP. Continue statin.    Medication Adjustments/Labs and Tests Ordered: Current medicines are reviewed at length with the patient today.  Concerns regarding medicines are outlined above.  Medication changes, Labs and Tests ordered today are listed in the Patient Instructions below. There are no Patient Instructions on file for this visit.   Lorelei Pont, [place]  02/11/2017 11:50 AM    [facility]  [facility] [address] [place], [place], [place]  [address] Phone: [phone]; Fax: [phone]

## 2017-04-22 ENCOUNTER — Ambulatory Visit (INDEPENDENT_AMBULATORY_CARE_PROVIDER_SITE_OTHER): Payer: Medicare Other | Admitting: Cardiovascular Disease

## 2017-04-22 ENCOUNTER — Encounter: Payer: Self-pay | Admitting: Cardiovascular Disease

## 2017-04-22 VITALS — BP 136/78 | HR 64 | Ht 62.0 in | Wt 163.2 lb

## 2017-04-22 DIAGNOSIS — I2510 Atherosclerotic heart disease of native coronary artery without angina pectoris: Secondary | ICD-10-CM | POA: Diagnosis not present

## 2017-04-22 DIAGNOSIS — E782 Mixed hyperlipidemia: Secondary | ICD-10-CM

## 2017-04-22 NOTE — Patient Instructions (Signed)
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
## 2018-04-19 ENCOUNTER — Encounter (HOSPITAL_BASED_OUTPATIENT_CLINIC_OR_DEPARTMENT_OTHER): Payer: Medicare Other | Attending: Internal Medicine

## 2018-04-19 DIAGNOSIS — Z87891 Personal history of nicotine dependence: Secondary | ICD-10-CM | POA: Insufficient documentation

## 2018-04-19 DIAGNOSIS — I2510 Atherosclerotic heart disease of native coronary artery without angina pectoris: Secondary | ICD-10-CM | POA: Insufficient documentation

## 2018-04-19 DIAGNOSIS — E119 Type 2 diabetes mellitus without complications: Secondary | ICD-10-CM | POA: Insufficient documentation

## 2018-04-19 DIAGNOSIS — I87331 Chronic venous hypertension (idiopathic) with ulcer and inflammation of right lower extremity: Secondary | ICD-10-CM | POA: Insufficient documentation

## 2018-04-19 DIAGNOSIS — I110 Hypertensive heart disease with heart failure: Secondary | ICD-10-CM | POA: Diagnosis not present

## 2018-04-19 DIAGNOSIS — I509 Heart failure, unspecified: Secondary | ICD-10-CM | POA: Insufficient documentation

## 2018-04-19 DIAGNOSIS — L97312 Non-pressure chronic ulcer of right ankle with fat layer exposed: Secondary | ICD-10-CM | POA: Insufficient documentation

## 2018-04-19 DIAGNOSIS — Z794 Long term (current) use of insulin: Secondary | ICD-10-CM | POA: Diagnosis not present

## 2018-04-19 DIAGNOSIS — I252 Old myocardial infarction: Secondary | ICD-10-CM | POA: Diagnosis not present

## 2018-04-19 DIAGNOSIS — M0580 Other rheumatoid arthritis with rheumatoid factor of unspecified site: Secondary | ICD-10-CM | POA: Diagnosis not present

## 2018-04-26 DIAGNOSIS — I87331 Chronic venous hypertension (idiopathic) with ulcer and inflammation of right lower extremity: Secondary | ICD-10-CM | POA: Diagnosis not present

## 2018-05-03 ENCOUNTER — Other Ambulatory Visit (HOSPITAL_BASED_OUTPATIENT_CLINIC_OR_DEPARTMENT_OTHER): Payer: Self-pay | Admitting: Internal Medicine

## 2018-05-03 DIAGNOSIS — I872 Venous insufficiency (chronic) (peripheral): Secondary | ICD-10-CM

## 2018-05-03 DIAGNOSIS — I87331 Chronic venous hypertension (idiopathic) with ulcer and inflammation of right lower extremity: Secondary | ICD-10-CM | POA: Diagnosis not present

## 2018-05-10 ENCOUNTER — Other Ambulatory Visit (HOSPITAL_COMMUNITY)
Admit: 2018-05-10 | Discharge: 2018-05-10 | Disposition: A | Discharge status: Home / Self Care | Payer: Medicare Other | Source: Ambulatory Visit | Attending: Internal Medicine | Admitting: Internal Medicine

## 2018-05-10 DIAGNOSIS — L97311 Non-pressure chronic ulcer of right ankle limited to breakdown of skin: Secondary | ICD-10-CM | POA: Insufficient documentation

## 2018-05-10 DIAGNOSIS — I87331 Chronic venous hypertension (idiopathic) with ulcer and inflammation of right lower extremity: Secondary | ICD-10-CM | POA: Diagnosis not present

## 2018-05-12 ENCOUNTER — Ambulatory Visit
Admission: RE | Admit: 2018-05-12 | Discharge: 2018-05-12 | Disposition: A | Discharge status: Home / Self Care | Payer: Medicare Other | Source: Ambulatory Visit | Attending: Internal Medicine | Admitting: Internal Medicine

## 2018-05-12 ENCOUNTER — Other Ambulatory Visit: Payer: Medicare Other

## 2018-05-12 DIAGNOSIS — I87331 Chronic venous hypertension (idiopathic) with ulcer and inflammation of right lower extremity: Secondary | ICD-10-CM | POA: Diagnosis not present

## 2018-05-12 DIAGNOSIS — I872 Venous insufficiency (chronic) (peripheral): Secondary | ICD-10-CM
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
## 2018-05-15 LAB — AEROBIC CULTURE W GRAM STAIN (SUPERFICIAL SPECIMEN)

## 2018-05-15 LAB — AEROBIC CULTURE  (SUPERFICIAL SPECIMEN)

## 2018-05-17 ENCOUNTER — Encounter (HOSPITAL_BASED_OUTPATIENT_CLINIC_OR_DEPARTMENT_OTHER): Payer: Medicare Other | Attending: Internal Medicine

## 2018-05-17 DIAGNOSIS — L97812 Non-pressure chronic ulcer of other part of right lower leg with fat layer exposed: Secondary | ICD-10-CM | POA: Diagnosis not present

## 2018-05-17 DIAGNOSIS — E119 Type 2 diabetes mellitus without complications: Secondary | ICD-10-CM | POA: Diagnosis not present

## 2018-05-17 DIAGNOSIS — I87332 Chronic venous hypertension (idiopathic) with ulcer and inflammation of left lower extremity: Secondary | ICD-10-CM | POA: Insufficient documentation

## 2018-05-17 DIAGNOSIS — I110 Hypertensive heart disease with heart failure: Secondary | ICD-10-CM | POA: Insufficient documentation

## 2018-05-17 DIAGNOSIS — I252 Old myocardial infarction: Secondary | ICD-10-CM | POA: Diagnosis not present

## 2018-05-17 DIAGNOSIS — I2510 Atherosclerotic heart disease of native coronary artery without angina pectoris: Secondary | ICD-10-CM | POA: Insufficient documentation

## 2018-05-17 DIAGNOSIS — I509 Heart failure, unspecified: Secondary | ICD-10-CM | POA: Insufficient documentation

## 2018-05-24 DIAGNOSIS — I87332 Chronic venous hypertension (idiopathic) with ulcer and inflammation of left lower extremity: Secondary | ICD-10-CM | POA: Diagnosis not present

## 2018-06-22 ENCOUNTER — Other Ambulatory Visit: Payer: Self-pay | Admitting: Interventional Radiology

## 2018-06-22 DIAGNOSIS — I872 Venous insufficiency (chronic) (peripheral): Secondary | ICD-10-CM

## 2018-06-28 ENCOUNTER — Other Ambulatory Visit: Payer: Medicare Other

## 2018-07-20 ENCOUNTER — Other Ambulatory Visit: Payer: Medicare Other

## 2018-12-15 ENCOUNTER — Encounter: Payer: Self-pay | Admitting: Physician Assistant

## 2019-01-04 ENCOUNTER — Encounter (INDEPENDENT_AMBULATORY_CARE_PROVIDER_SITE_OTHER): Payer: Self-pay

## 2019-01-04 ENCOUNTER — Encounter: Payer: Self-pay | Admitting: Physician Assistant

## 2019-01-04 ENCOUNTER — Ambulatory Visit: Payer: Medicare Other | Admitting: Physician Assistant

## 2019-01-04 VITALS — BP 128/60 | HR 65 | Ht 62.0 in | Wt 146.0 lb

## 2019-01-04 DIAGNOSIS — E782 Mixed hyperlipidemia: Secondary | ICD-10-CM | POA: Diagnosis not present

## 2019-01-04 DIAGNOSIS — E119 Type 2 diabetes mellitus without complications: Secondary | ICD-10-CM

## 2019-01-04 DIAGNOSIS — R1310 Dysphagia, unspecified: Secondary | ICD-10-CM

## 2019-01-04 DIAGNOSIS — I25119 Atherosclerotic heart disease of native coronary artery with unspecified angina pectoris: Secondary | ICD-10-CM | POA: Diagnosis not present

## 2019-01-04 DIAGNOSIS — Z794 Long term (current) use of insulin: Secondary | ICD-10-CM

## 2019-01-04 MED ORDER — NITROGLYCERIN 0.4 MG SL SUBL: 0.4000 mg | SUBLINGUAL_TABLET | SUBLINGUAL | 5 refills | Status: DC | PRN

## 2019-01-04 NOTE — Patient Instructions (Addendum)
Medication Instructions:  Your physician recommends that you continue on your current medications as directed. Please refer to the Current Medication list given to you today.  If you need a refill on your cardiac medications before your next appointment, please call your pharmacy.   Lab work: TODAY: BMET, CBC  If you have labs (blood work) drawn today and your tests are completely normal, you will receive your results only by: [name] MyChart Message (if you have MyChart) OR . A paper copy in the mail If you have any lab test that is abnormal or we need to change your treatment, we will call you to review the results.  Testing/Procedures: Your physician has requested that you have a cardiac catheterization. Cardiac catheterization is used to diagnose and/or treat various heart conditions. Doctors may recommend this procedure for a number of different reasons. The most common reason is to evaluate chest pain. Chest pain can be a symptom of coronary artery disease (CAD), and cardiac catheterization can show whether plaque is narrowing or blocking your heart's arteries. This procedure is also used to evaluate the valves, as well as measure the blood flow and oxygen levels in different parts of your heart. For further information please visit [email or web address] Please follow instruction sheet, as given.  Follow-Up: At [facility], you and your health needs are our priority.  As part of our continuing mission to provide you with exceptional heart care, we have created designated Provider Care Teams.  These Care Teams include your primary Cardiologist (physician) and Advanced Practice Providers (APPs -  Physician Assistants and Nurse Practitioners) who all work together to provide you with the care you need, when you need it. . Your physician recommends that you schedule a follow-up appointment in: 2 WEEKS WITH DR. Elease [name] OR [name] WEAVER, PA-C  Any Other Special Instructions Will Be Listed Below (If  Applicable).    [facility] CARDIOVASCULAR DIVISION [facility] ST OFFICE [name] [place] [address] Dept: [phone] Loc: [phone]  [name] [name]  01/04/2019  You are scheduled for a Cardiac Catheterization on Tuesday, February 25 with Dr. Peter [name].  1. Please arrive at the [facility] (Main Entrance A) at [facility]: [address] [place], [place] [address] at 7:00 AM (This time is two hours before your procedure to ensure your preparation). Free valet parking service is available.   Special note: Every effort is made to have your procedure done on time. Please understand that emergencies sometimes delay scheduled procedures.  2. Diet: Do not eat solid foods after midnight.  The patient may have clear liquids until 5am upon the day of the procedure.  3. Labs: You will need to have blood drawn on Wednesday, February 19 at [facility] at [facility]. [address], [place]. You do not need to be fasting.  4. Medication instructions in preparation for your procedure:   Contrast Allergy: No  TAKE ONLY 2.5 UNITS OF INSULIN THE NIGHT BEFORE YOUR PROCEDURE. DO NOT TAKE ANY INSULIN THE DAY OF THE PROCEDURE.   On the morning of your procedure, take your Aspirin 81 MG and any morning medicines NOT listed above.  You may use sips of water.  5. Plan for one night stay--bring personal belongings. 6. Bring a current list of your medications and current insurance cards. 7. You MUST have a responsible person to drive you home. 8. Someone MUST be with you the first 24 hours  after you arrive home or your discharge will be delayed. 9. Please wear clothes that are easy to get on and off and wear slip-on shoes.  Thank you for allowing [place] to care for you!   -- [facility]

## 2019-01-04 NOTE — Progress Notes (Addendum)
Cardiology Office Note:    Date:  01/04/2019   ID:  [name] [name], DOB [birth year], MRN [id number]  PCP:  Irena Reichmann, DO  Cardiologist:  Kristeen Miss, MD   Electrophysiologist:  None   Referring MD: No ref. provider found   Chief Complaint  Patient presents with  . Chest Pain     History of Present Illness:    [name] [name] is a 78 y.o. female with coronary artery disease status post remote myocardial infarction treated with angioplasty to the LCx in August 1997, diabetes, hyperlipidemia, rheumatoid arthritis, carotid stenosis.  She was previously followed by Dr. Myrtis [name].  She last saw Dr. Elease [name] in June 2018.  [name] [name] returns for evaluation of chest discomfort and shortness of breath.  Over the past few months, she has noticed exertional shortness of breath which is unusual for her.  She has also had some chest tightness.  She has not had any radiating symptoms, associated nausea or diaphoresis.  She has taken nitroglycerin with relief.  She has not had orthopnea, paroxysmal nocturnal dyspnea, lower extremity swelling or syncope.  She denies any bleeding issues.  She has had some difficulty with swallowing.  Of note, she had an EGD in 2017 that did demonstrate an esophageal web.  Prior CV studies:   The following studies were reviewed today:  GXT 07/08/2016 Patient only achieved 86% of PMHR 1 mm Horizontal ST segment depression in lateral leads peak stress and recovery  Suggest f/u perfusion imaging   Echo 05/08/2015 EF 55-60, normal wall motion, grade 1 diastolic dysfunction  Myoview 12/02/3565 EF 75, no ischemia, normal study  Carotid US 09/23/2010 Bilateral ICA 0-39  Cardiac catheterization 12/26/02 LM distal 20 LAD proximal-mid 30, distal 60 LCx ostial 80, mid to distal 30 RCA proximal 75/80, distal 60 EF 65-70 LCx not optimal for PCI, RCA small >>medical therapy  Past Medical History:  Diagnosis Date  . CAD (coronary artery disease)    Catheterization 2004,  60% distal LAD, 80% ostial circumflex( not optimal for PCI), 70% small RCA, medical therapy  /  nuclear June, 2005, EF 65%, no ischemia  . Carotid artery disease (HCC)    Doppler, November, 2011, no significant plaque, distal R. ICA velocities are elevated and could be source of bruit, 0-39% bilateral  . Diabetes mellitus   . Drug therapy    Intermittent steroid use  . Dyslipidemia   . Edema   . Ejection fraction    EF 60%, echo, November, 2011, trivial pericardial effusion  . Rheumatoid arthritis(714.0)    Hospitalization August, 2011, severe RA flare,   . Statin intolerance    Surgical Hx: The patient  has a past surgical history that includes Cholecystectomy and Abdominal hysterectomy.   Current Medications: Current Meds  Medication Sig  . Abatacept (ORENCIA) 125 MG/ML SOSY Inject 75 mg into the skin every 30 (thirty) days.  [name] aspirin 81 MG tablet Take 1 tablet (81 mg total) by mouth daily.  . Cholecalciferol (VITAMIN D-3) 1000 units CAPS Take 1,000 Units by mouth daily.   . folic acid (FOLVITE) 1 MG tablet Take 1 mg by mouth every evening.   [name] losartan (COZAAR) 50 MG tablet Take 50 mg by mouth daily.  . methotrexate (RHEUMATREX) 2.5 MG tablet Take 15 mg by mouth every Monday. 6 tabs/Mondays  . Multiple Vitamin (MULTIVITAMIN WITH MINERALS) TABS tablet Take 1 tablet by mouth daily. Centrum silver  . nitroGLYCERIN (NITROSTAT) 0.4 MG SL tablet Place 1 tablet (0.4 mg  total) under the tongue every 5 (five) minutes as needed for chest pain.  . pantoprazole (PROTONIX) 40 MG tablet Take 40 mg by mouth daily.  . [DISCONTINUED] INSULIN REGULAR HUMAN IJ Inject 5 mLs as directed daily.  . [DISCONTINUED] nitroGLYCERIN (NITROSTAT) 0.4 MG SL tablet Place 1 tablet (0.4 mg total) under the tongue every 5 (five) minutes as needed for chest pain.     Allergies:   Cholesterol; Hydrocodone; Statins; Insulins; and Metformin and related   Social History   Tobacco Use  . Smoking status: Former  Smoker    Last attempt to quit: 11/16/1994    Years since quitting: 24.1  . Smokeless tobacco: Never Used  Substance Use Topics  . Alcohol use: No    Alcohol/week: 0.0 standard drinks  . Drug use: No     Family Hx: The patient's family history includes Diabetes in her mother; Hypertension in her mother; Other in her father.  ROS:   Please see the history of present illness.    Review of Systems  Constitution: Positive for malaise/fatigue.  Cardiovascular: Positive for chest pain.  Hematologic/Lymphatic: Bruises/bleeds easily.  Gastrointestinal: Positive for diarrhea and dysphagia.  Genitourinary: Positive for incomplete emptying.  Psychiatric/Behavioral: The patient is nervous/anxious.    All other systems reviewed and are negative.   EKGs/Labs/Other Test Reviewed:    EKG:  EKG is   ordered today.  The ekg ordered today demonstrates normal sinus rhythm, heart rate 65, normal axis, QTC 411, no significant change since prior tracing  Recent Labs: No results found for requested labs within last 8760 hours.   Recent Lipid Panel No results found for: CHOL, TRIG, HDL, CHOLHDL, LDLCALC, LDLDIRECT  Physical Exam:    VS:  BP 128/60   Pulse 65   Ht 5' 2" (1.575 m)   Wt 146 lb (66.2 kg)   SpO2 99%   BMI 26.70 kg/m     Wt Readings from Last 3 Encounters:  01/04/19 146 lb (66.2 kg)  05/12/18 155 lb (70.3 kg)  04/22/17 163 lb 4 oz (74 kg)     Physical Exam  Constitutional: She is oriented to person, place, and time. She appears well-developed and well-nourished. No distress.  HENT:  Head: Normocephalic and atraumatic.  Eyes: No scleral icterus.  Neck: Neck supple. No JVD present. No thyromegaly present.  Cardiovascular: Normal rate, regular rhythm, S1 normal, S2 normal and normal heart sounds.  No murmur heard. Pulmonary/Chest: Effort normal. She has no rales.  Abdominal: Soft. She exhibits no distension. There is no hepatomegaly.  Musculoskeletal:        General: No  edema.  Lymphadenopathy:    She has no cervical adenopathy.  Neurological: She is alert and oriented to person, place, and time.  Skin: Skin is warm and dry.  Psychiatric: She has a normal mood and affect.    ASSESSMENT & PLAN:    Coronary artery disease involving native coronary artery of native heart with angina pectoris (HCC) History of remote myocardial infarction in 1997 treated with angioplasty to the left circumflex.  Cardiac catheterization 16 years ago demonstrated diffuse three-vessel disease.  She had an ostial LCx 80% stenosis and proximal RCA 80% stenosis.  Medical therapy was recommended at that time.  Nuclear stress test in 2014 was negative for ischemia.  ECG today does not demonstrate any acute findings.  However, she has recently had exertional symptoms which are new for her.  She has relief with nitroglycerin.  She has not used nitroglycerin in   the past.  I have recommended proceeding with cardiac catheterization to further evaluate her symptoms.  I discussed this with Dr. Elease [name], who agreed.  Risks and benefits of cardiac catheterization have been discussed with the patient.  These include bleeding, infection, kidney damage, stroke, heart attack, death.  The patient understands these risks and is willing to proceed.   -Arrange L heart cath  -Continue ASA  -She is intol of statins  -Rx for NTG prn  -Labs: BMET, CBC  -FU 2 weeks post cath  Mixed hyperlipidemia Intol of statins.  Consider referral to CVRR for PCSK9 inhibitor.  Type 2 diabetes mellitus without complication, with long-term current use of insulin (HCC) She takes regular insulin at meal times. Managed by primary care.  Empagliflozin could be considered given the results of the EMPA-REG OUTCOME trial.   Dysphagia, unspecified type I have asked her to follow up with her gastroenterologist.   Dispo:  Return in about 2 weeks (around 01/18/2019) for Post Procedure Follow Up, w/ Dr. Elease [name], or Tereso Newcomer, PA-C.    Medication Adjustments/Labs and Tests Ordered: Current medicines are reviewed at length with the patient today.  Concerns regarding medicines are outlined above.  Tests Ordered: Orders Placed This Encounter  Procedures  . Basic metabolic panel  . CBC  . EKG 12-Lead   Medication Changes: Meds ordered this encounter  Medications  . nitroGLYCERIN (NITROSTAT) 0.4 MG SL tablet    Sig: Place 1 tablet (0.4 mg total) under the tongue every 5 (five) minutes as needed for chest pain.    Dispense:  25 tablet    Refill:  5    Signed, Tereso Newcomer, PA-C  01/04/2019 3:52 PM    [facility] [address] [place], [place], [place]  [address] Phone: [phone]; Fax: [phone]    Attending Note:   The patient was seen and examined.  Agree with assessment and plan as noted above.  Changes made to the above note as needed.  Patient seen and independently examined with Tereso Newcomer, PA .   We discussed all aspects of the encounter. I agree with the assessment and plan as stated above.  1.   Ive discussed the case with Tereso Newcomer and agree with plans for cardiac cath     I have spent a total of 40 minutes with patient reviewing hospital  notes , telemetry, EKGs, labs and examining patient as well as establishing an assessment and plan that was discussed with the patient. > 50% of time was spent in direct patient care.    Vesta Mixer, Montez Hageman., MD, [facility] 01/05/2019, 3:00 PM [address],  [address] Office [phone] Pager [phone]

## 2019-01-04 NOTE — H&P (View-Only) (Signed)
Cardiology Office Note:    Date:  01/04/2019   ID:  [name] [name], DOB [birth year], MRN [id number]  PCP:  Irena Reichmann, DO  Cardiologist:  Kristeen Miss, MD   Electrophysiologist:  None   Referring MD: No ref. provider found   Chief Complaint  Patient presents with  . Chest Pain     History of Present Illness:    [name] [name] is a 78 y.o. female with coronary artery disease status post remote myocardial infarction treated with angioplasty to the LCx in August 1997, diabetes, hyperlipidemia, rheumatoid arthritis, carotid stenosis.  She was previously followed by Dr. Myrtis [name].  She last saw Dr. Elease [name] in June 2018.  [name] [name] returns for evaluation of chest discomfort and shortness of breath.  Over the past few months, she has noticed exertional shortness of breath which is unusual for her.  She has also had some chest tightness.  She has not had any radiating symptoms, associated nausea or diaphoresis.  She has taken nitroglycerin with relief.  She has not had orthopnea, paroxysmal nocturnal dyspnea, lower extremity swelling or syncope.  She denies any bleeding issues.  She has had some difficulty with swallowing.  Of note, she had an EGD in 2017 that did demonstrate an esophageal web.  Prior CV studies:   The following studies were reviewed today:  GXT 07/08/2016 Patient only achieved 86% of PMHR 1 mm Horizontal ST segment depression in lateral leads peak stress and recovery  Suggest f/u perfusion imaging   Echo 05/08/2015 EF 55-60, normal wall motion, grade 1 diastolic dysfunction  Myoview 12/02/3565 EF 75, no ischemia, normal study  Carotid US 09/23/2010 Bilateral ICA 0-39  Cardiac catheterization 12/26/02 LM distal 20 LAD proximal-mid 30, distal 60 LCx ostial 80, mid to distal 30 RCA proximal 75/80, distal 60 EF 65-70 LCx not optimal for PCI, RCA small >>medical therapy  Past Medical History:  Diagnosis Date  . CAD (coronary artery disease)    Catheterization 2004,  60% distal LAD, 80% ostial circumflex( not optimal for PCI), 70% small RCA, medical therapy  /  nuclear June, 2005, EF 65%, no ischemia  . Carotid artery disease (HCC)    Doppler, November, 2011, no significant plaque, distal R. ICA velocities are elevated and could be source of bruit, 0-39% bilateral  . Diabetes mellitus   . Drug therapy    Intermittent steroid use  . Dyslipidemia   . Edema   . Ejection fraction    EF 60%, echo, November, 2011, trivial pericardial effusion  . Rheumatoid arthritis(714.0)    Hospitalization August, 2011, severe RA flare,   . Statin intolerance    Surgical Hx: The patient  has a past surgical history that includes Cholecystectomy and Abdominal hysterectomy.   Current Medications: Current Meds  Medication Sig  . Abatacept (ORENCIA) 125 MG/ML SOSY Inject 75 mg into the skin every 30 (thirty) days.  [name] aspirin 81 MG tablet Take 1 tablet (81 mg total) by mouth daily.  . Cholecalciferol (VITAMIN D-3) 1000 units CAPS Take 1,000 Units by mouth daily.   . folic acid (FOLVITE) 1 MG tablet Take 1 mg by mouth every evening.   [name] losartan (COZAAR) 50 MG tablet Take 50 mg by mouth daily.  . methotrexate (RHEUMATREX) 2.5 MG tablet Take 15 mg by mouth every Monday. 6 tabs/Mondays  . Multiple Vitamin (MULTIVITAMIN WITH MINERALS) TABS tablet Take 1 tablet by mouth daily. Centrum silver  . nitroGLYCERIN (NITROSTAT) 0.4 MG SL tablet Place 1 tablet (0.4 mg  total) under the tongue every 5 (five) minutes as needed for chest pain.  . pantoprazole (PROTONIX) 40 MG tablet Take 40 mg by mouth daily.  . [DISCONTINUED] INSULIN REGULAR HUMAN IJ Inject 5 mLs as directed daily.  . [DISCONTINUED] nitroGLYCERIN (NITROSTAT) 0.4 MG SL tablet Place 1 tablet (0.4 mg total) under the tongue every 5 (five) minutes as needed for chest pain.     Allergies:   Cholesterol; Hydrocodone; Statins; Insulins; and Metformin and related   Social History   Tobacco Use  . Smoking status: Former  Smoker    Last attempt to quit: 11/16/1994    Years since quitting: 24.1  . Smokeless tobacco: Never Used  Substance Use Topics  . Alcohol use: No    Alcohol/week: 0.0 standard drinks  . Drug use: No     Family Hx: The patient's family history includes Diabetes in her mother; Hypertension in her mother; Other in her father.  ROS:   Please see the history of present illness.    Review of Systems  Constitution: Positive for malaise/fatigue.  Cardiovascular: Positive for chest pain.  Hematologic/Lymphatic: Bruises/bleeds easily.  Gastrointestinal: Positive for diarrhea and dysphagia.  Genitourinary: Positive for incomplete emptying.  Psychiatric/Behavioral: The patient is nervous/anxious.    All other systems reviewed and are negative.   EKGs/Labs/Other Test Reviewed:    EKG:  EKG is   ordered today.  The ekg ordered today demonstrates normal sinus rhythm, heart rate 65, normal axis, QTC 411, no significant change since prior tracing  Recent Labs: No results found for requested labs within last 8760 hours.   Recent Lipid Panel No results found for: CHOL, TRIG, HDL, CHOLHDL, LDLCALC, LDLDIRECT  Physical Exam:    VS:  BP 128/60   Pulse 65   Ht 5\' 2"  (1.575 m)   Wt 146 lb (66.2 kg)   SpO2 99%   BMI 26.70 kg/m     Wt Readings from Last 3 Encounters:  01/04/19 146 lb (66.2 kg)  05/12/18 155 lb (70.3 kg)  04/22/17 163 lb 4 oz (74 kg)     Physical Exam  Constitutional: She is oriented to person, place, and time. She appears well-developed and well-nourished. No distress.  HENT:  Head: Normocephalic and atraumatic.  Eyes: No scleral icterus.  Neck: Neck supple. No JVD present. No thyromegaly present.  Cardiovascular: Normal rate, regular rhythm, S1 normal, S2 normal and normal heart sounds.  No murmur heard. Pulmonary/Chest: Effort normal. She has no rales.  Abdominal: Soft. She exhibits no distension. There is no hepatomegaly.  Musculoskeletal:        General: No  edema.  Lymphadenopathy:    She has no cervical adenopathy.  Neurological: She is alert and oriented to person, place, and time.  Skin: Skin is warm and dry.  Psychiatric: She has a normal mood and affect.    ASSESSMENT & PLAN:    Coronary artery disease involving native coronary artery of native heart with angina pectoris (HCC) History of remote myocardial infarction in 1997 treated with angioplasty to the left circumflex.  Cardiac catheterization 16 years ago demonstrated diffuse three-vessel disease.  She had an ostial LCx 80% stenosis and proximal RCA 80% stenosis.  Medical therapy was recommended at that time.  Nuclear stress test in 2014 was negative for ischemia.  ECG today does not demonstrate any acute findings.  However, she has recently had exertional symptoms which are new for her.  She has relief with nitroglycerin.  She has not used nitroglycerin in  the past.  I have recommended proceeding with cardiac catheterization to further evaluate her symptoms.  I discussed this with Dr. Elease [name], who agreed.  Risks and benefits of cardiac catheterization have been discussed with the patient.  These include bleeding, infection, kidney damage, stroke, heart attack, death.  The patient understands these risks and is willing to proceed.   -Arrange L heart cath  -Continue ASA  -She is intol of statins  -Rx for NTG prn  -Labs: BMET, CBC  -FU 2 weeks post cath  Mixed hyperlipidemia Intol of statins.  Consider referral to CVRR for PCSK9 inhibitor.  Type 2 diabetes mellitus without complication, with long-term current use of insulin (HCC) She takes regular insulin at meal times. Managed by primary care.  Empagliflozin could be considered given the results of the EMPA-REG OUTCOME trial.   Dysphagia, unspecified type I have asked her to follow up with her gastroenterologist.   Dispo:  Return in about 2 weeks (around 01/18/2019) for Post Procedure Follow Up, w/ Dr. Elease [name], or Tereso Newcomer, PA-C.    Medication Adjustments/Labs and Tests Ordered: Current medicines are reviewed at length with the patient today.  Concerns regarding medicines are outlined above.  Tests Ordered: Orders Placed This Encounter  Procedures  . Basic metabolic panel  . CBC  . EKG 12-Lead   Medication Changes: Meds ordered this encounter  Medications  . nitroGLYCERIN (NITROSTAT) 0.4 MG SL tablet    Sig: Place 1 tablet (0.4 mg total) under the tongue every 5 (five) minutes as needed for chest pain.    Dispense:  25 tablet    Refill:  5    Signed, Tereso Newcomer, PA-C  01/04/2019 3:52 PM    [facility] [address] [place], [place], [place]  [address] Phone: [phone]; Fax: [phone]    Attending Note:   The patient was seen and examined.  Agree with assessment and plan as noted above.  Changes made to the above note as needed.  Patient seen and independently examined with Tereso Newcomer, PA .   We discussed all aspects of the encounter. I agree with the assessment and plan as stated above.  1.   Ive discussed the case with Tereso Newcomer and agree with plans for cardiac cath     I have spent a total of 40 minutes with patient reviewing hospital  notes , telemetry, EKGs, labs and examining patient as well as establishing an assessment and plan that was discussed with the patient. > 50% of time was spent in direct patient care.    Vesta Mixer, Montez Hageman., MD, [facility] 01/05/2019, 3:00 PM [address],  [address] Office [phone] Pager [phone]

## 2019-01-05 ENCOUNTER — Telehealth: Payer: Self-pay | Admitting: *Deleted

## 2019-01-05 LAB — BASIC METABOLIC PANEL
BUN/Creatinine Ratio: 13 (ref 12–28)
BUN: 11 mg/dL (ref 8–27)
CO2: 22 mmol/L (ref 20–29)
CREATININE: 0.86 mg/dL (ref 0.57–1.00)
Calcium: 9.3 mg/dL (ref 8.7–10.3)
Chloride: 103 mmol/L (ref 96–106)
GFR calc Af Amer: 75 mL/min/{1.73_m2} (ref >59)
GFR calc non Af Amer: 65 mL/min/{1.73_m2} (ref >59)
GLUCOSE: 113 mg/dL — AB (ref 65–99)
Potassium: 4.1 mmol/L (ref 3.5–5.2)
SODIUM: 140 mmol/L (ref 134–144)

## 2019-01-05 LAB — CBC
Hematocrit: 35.7 % (ref 34.0–46.6)
Hemoglobin: 11.7 g/dL (ref 11.1–15.9)
MCH: 29.3 pg (ref 26.6–33.0)
MCHC: 32.8 g/dL (ref 31.5–35.7)
MCV: 90 fL (ref 79–97)
Platelets: 247 10*3/uL (ref 150–450)
RBC: 3.99 x10E6/uL (ref 3.77–5.28)
RDW: 14.5 % (ref 11.7–15.4)
WBC: 5.2 10*3/uL (ref 3.4–10.8)

## 2019-01-05 NOTE — Telephone Encounter (Signed)
Called patient with test results. No answer. Left message to call back.  

## 2019-01-05 NOTE — Telephone Encounter (Signed)
-----   Message from Beatrice Lecher, [place] sent at 01/05/2019  8:15 AM EST ----- Renal function, K+, Hgb normal. Recommendations:  - Continue current medications and follow up as planned.  Tereso Newcomer, PA-C    01/05/2019 8:14 AM

## 2019-01-09 ENCOUNTER — Telehealth: Payer: Self-pay | Admitting: *Deleted

## 2019-01-09 NOTE — Telephone Encounter (Signed)
I left detailed message on home answering machine (DPR) with instructions, provided my phone number to call if questions.

## 2019-01-09 NOTE — Telephone Encounter (Signed)
Pt contacted pre-catheterization scheduled at [facility] for: Tuesday February 25,2020 9 AM Verified arrival time and place: [facility] Main Entrance A at: 7 AM  No solid food after midnight prior to cath, clear liquids until 5 AM day of procedure. Contrast allergy: no  Hold: Insulin-AM of procedure. 1/2 Insulin PM prior to procedure. Furosemide-AM of procedure.  Except hold medications AM meds can be  taken pre-cath with sip of water including: ASA 81 mg  Confirm patient has responsible person to drive home post procedure and observe 24 hours after arriving home.  LMTCB to review instructions with patient.

## 2019-01-10 ENCOUNTER — Encounter (HOSPITAL_COMMUNITY)
Admission: RE | Disposition: A | Discharge status: Home / Self Care | Payer: Self-pay | Source: Home / Self Care | Attending: Cardiology

## 2019-01-10 ENCOUNTER — Other Ambulatory Visit: Payer: Self-pay

## 2019-01-10 ENCOUNTER — Ambulatory Visit (HOSPITAL_COMMUNITY)
Admission: RE | Admit: 2019-01-10 | Discharge: 2019-01-10 | Disposition: A | Discharge status: Home / Self Care | Payer: Medicare Other | Attending: Cardiology | Admitting: Cardiology

## 2019-01-10 DIAGNOSIS — Z7982 Long term (current) use of aspirin: Secondary | ICD-10-CM | POA: Insufficient documentation

## 2019-01-10 DIAGNOSIS — Z79899 Other long term (current) drug therapy: Secondary | ICD-10-CM | POA: Diagnosis not present

## 2019-01-10 DIAGNOSIS — Z885 Allergy status to narcotic agent status: Secondary | ICD-10-CM | POA: Insufficient documentation

## 2019-01-10 DIAGNOSIS — R1310 Dysphagia, unspecified: Secondary | ICD-10-CM | POA: Diagnosis not present

## 2019-01-10 DIAGNOSIS — Z888 Allergy status to other drugs, medicaments and biological substances status: Secondary | ICD-10-CM | POA: Diagnosis not present

## 2019-01-10 DIAGNOSIS — I252 Old myocardial infarction: Secondary | ICD-10-CM | POA: Diagnosis not present

## 2019-01-10 DIAGNOSIS — M069 Rheumatoid arthritis, unspecified: Secondary | ICD-10-CM | POA: Insufficient documentation

## 2019-01-10 DIAGNOSIS — Z8249 Family history of ischemic heart disease and other diseases of the circulatory system: Secondary | ICD-10-CM | POA: Diagnosis not present

## 2019-01-10 DIAGNOSIS — R0609 Other forms of dyspnea: Secondary | ICD-10-CM | POA: Diagnosis present

## 2019-01-10 DIAGNOSIS — Z87891 Personal history of nicotine dependence: Secondary | ICD-10-CM | POA: Diagnosis not present

## 2019-01-10 DIAGNOSIS — I200 Unstable angina: Secondary | ICD-10-CM | POA: Diagnosis present

## 2019-01-10 DIAGNOSIS — I739 Peripheral vascular disease, unspecified: Secondary | ICD-10-CM

## 2019-01-10 DIAGNOSIS — Z833 Family history of diabetes mellitus: Secondary | ICD-10-CM | POA: Insufficient documentation

## 2019-01-10 DIAGNOSIS — Z794 Long term (current) use of insulin: Secondary | ICD-10-CM | POA: Insufficient documentation

## 2019-01-10 DIAGNOSIS — R0602 Shortness of breath: Secondary | ICD-10-CM | POA: Diagnosis present

## 2019-01-10 DIAGNOSIS — E119 Type 2 diabetes mellitus without complications: Secondary | ICD-10-CM

## 2019-01-10 DIAGNOSIS — E785 Hyperlipidemia, unspecified: Secondary | ICD-10-CM | POA: Diagnosis present

## 2019-01-10 DIAGNOSIS — I2584 Coronary atherosclerosis due to calcified coronary lesion: Secondary | ICD-10-CM | POA: Diagnosis not present

## 2019-01-10 DIAGNOSIS — I209 Angina pectoris, unspecified: Secondary | ICD-10-CM | POA: Diagnosis present

## 2019-01-10 DIAGNOSIS — I779 Disorder of arteries and arterioles, unspecified: Secondary | ICD-10-CM | POA: Diagnosis present

## 2019-01-10 DIAGNOSIS — I6529 Occlusion and stenosis of unspecified carotid artery: Secondary | ICD-10-CM | POA: Insufficient documentation

## 2019-01-10 DIAGNOSIS — I25119 Atherosclerotic heart disease of native coronary artery with unspecified angina pectoris: Secondary | ICD-10-CM | POA: Diagnosis not present

## 2019-01-10 DIAGNOSIS — I2510 Atherosclerotic heart disease of native coronary artery without angina pectoris: Secondary | ICD-10-CM

## 2019-01-10 DIAGNOSIS — E782 Mixed hyperlipidemia: Secondary | ICD-10-CM | POA: Diagnosis not present

## 2019-01-10 DIAGNOSIS — Z90710 Acquired absence of both cervix and uterus: Secondary | ICD-10-CM | POA: Insufficient documentation

## 2019-01-10 HISTORY — PX: LEFT HEART CATH AND CORONARY ANGIOGRAPHY: CATH118249

## 2019-01-10 LAB — GLUCOSE, CAPILLARY
Glucose-Capillary: 78 mg/dL (ref 70–99)
Glucose-Capillary: 69 mg/dL — ABNORMAL LOW (ref 70–99)
Glucose-Capillary: 98 mg/dL (ref 70–99)
Glucose-Capillary: 98 mg/dL (ref 70–99)
Glucose-Capillary: 151 mg/dL — ABNORMAL HIGH (ref 70–99)

## 2019-01-10 SURGERY — LEFT HEART CATH AND CORONARY ANGIOGRAPHY
Anesthesia: LOCAL

## 2019-01-10 MED ORDER — ACETAMINOPHEN 325 MG PO TABS: 650.0000 mg | ORAL_TABLET | ORAL | Status: DC | PRN

## 2019-01-10 MED ORDER — SODIUM CHLORIDE 0.9 % IV SOLN: 250.0000 mL | INTRAVENOUS | Status: DC | PRN

## 2019-01-10 MED ORDER — ASPIRIN 81 MG PO CHEW
81.0000 mg | CHEWABLE_TABLET | ORAL | Status: AC
  Administered 2019-01-10: 81 mg via ORAL
  Filled 2019-01-10: qty 1

## 2019-01-10 MED ORDER — HEPARIN (PORCINE) IN NACL 1000-0.9 UT/500ML-% IV SOLN
INTRAVENOUS | Status: AC
  Filled 2019-01-10: qty 1000

## 2019-01-10 MED ORDER — SODIUM CHLORIDE 0.9 % WEIGHT BASED INFUSION
3.0000 mL/kg/h | INTRAVENOUS | Status: AC
  Administered 2019-01-10: 3 mL/kg/h via INTRAVENOUS

## 2019-01-10 MED ORDER — MIDAZOLAM HCL 2 MG/2ML IJ SOLN
INTRAMUSCULAR | Status: AC
  Filled 2019-01-10: qty 2

## 2019-01-10 MED ORDER — SODIUM CHLORIDE 0.9% FLUSH: 3.0000 mL | INTRAVENOUS | Status: DC | PRN

## 2019-01-10 MED ORDER — IOHEXOL 350 MG/ML SOLN
INTRAVENOUS | Status: DC | PRN
  Administered 2019-01-10: 55 mL via INTRAVENOUS

## 2019-01-10 MED ORDER — SODIUM CHLORIDE 0.9 % WEIGHT BASED INFUSION: 1.0000 mL/kg/h | INTRAVENOUS | Status: AC

## 2019-01-10 MED ORDER — SODIUM CHLORIDE 0.9% FLUSH: 3.0000 mL | Freq: Two times a day (BID) | INTRAVENOUS | Status: DC

## 2019-01-10 MED ORDER — SODIUM CHLORIDE 0.9 % WEIGHT BASED INFUSION: 1.0000 mL/kg/h | INTRAVENOUS | Status: DC

## 2019-01-10 MED ORDER — ONDANSETRON HCL 4 MG/2ML IJ SOLN: 4.0000 mg | Freq: Four times a day (QID) | INTRAMUSCULAR | Status: DC | PRN

## 2019-01-10 MED ORDER — VERAPAMIL HCL 2.5 MG/ML IV SOLN
INTRAVENOUS | Status: DC | PRN
  Administered 2019-01-10: 09:00:00 via INTRA_ARTERIAL

## 2019-01-10 MED ORDER — HEPARIN SODIUM (PORCINE) 1000 UNIT/ML IJ SOLN
INTRAMUSCULAR | Status: DC | PRN
  Administered 2019-01-10: 3500 [IU] via INTRAVENOUS

## 2019-01-10 MED ORDER — VERAPAMIL HCL 2.5 MG/ML IV SOLN
INTRAVENOUS | Status: AC
  Filled 2019-01-10: qty 2

## 2019-01-10 MED ORDER — FENTANYL CITRATE (PF) 100 MCG/2ML IJ SOLN
INTRAMUSCULAR | Status: DC | PRN
  Administered 2019-01-10 (×2): 25 ug via INTRAVENOUS

## 2019-01-10 MED ORDER — FENTANYL CITRATE (PF) 100 MCG/2ML IJ SOLN
INTRAMUSCULAR | Status: AC
  Filled 2019-01-10: qty 2

## 2019-01-10 MED ORDER — MIDAZOLAM HCL 2 MG/2ML IJ SOLN
INTRAMUSCULAR | Status: DC | PRN
  Administered 2019-01-10: 1 mg via INTRAVENOUS

## 2019-01-10 MED ORDER — HEPARIN (PORCINE) IN NACL 1000-0.9 UT/500ML-% IV SOLN
INTRAVENOUS | Status: DC | PRN
  Administered 2019-01-10 (×2): 500 mL

## 2019-01-10 MED ORDER — METOPROLOL SUCCINATE ER 25 MG PO TB24: 25.0000 mg | ORAL_TABLET | Freq: Every day | ORAL | 11 refills | Status: DC

## 2019-01-10 MED ORDER — LIDOCAINE HCL (PF) 1 % IJ SOLN
INTRAMUSCULAR | Status: AC
  Filled 2019-01-10: qty 30

## 2019-01-10 MED ORDER — LIDOCAINE HCL (PF) 1 % IJ SOLN
INTRAMUSCULAR | Status: DC | PRN
  Administered 2019-01-10: 2 mL

## 2019-01-10 SURGICAL SUPPLY — 10 items
CATH 5FR JL3.5 JR4 ANG PIG MP (CATHETERS) ×1 IMPLANT
DEVICE RAD TR BAND REGULAR (VASCULAR PRODUCTS) ×1 IMPLANT
GLIDESHEATH SLEND SS 6F .021 (SHEATH) ×1 IMPLANT
GUIDEWIRE INQWIRE 1.5J.035X260 (WIRE) IMPLANT
INQWIRE 1.5J .035X260CM (WIRE) ×2
KIT HEART LEFT (KITS) ×2 IMPLANT
PACK CARDIAC CATHETERIZATION (CUSTOM PROCEDURE TRAY) ×2 IMPLANT
SYR MEDRAD MARK 7 150ML (SYRINGE) ×2 IMPLANT
TRANSDUCER W/STOPCOCK (MISCELLANEOUS) ×2 IMPLANT
TUBING CIL FLEX 10 FLL-RA (TUBING) ×2 IMPLANT

## 2019-01-10 NOTE — Discharge Instructions (Signed)
Drink plenty of fluids over next 48 hours and keep right wrist elevated at heart level for 24 hours  Radial Site Care  This sheet gives you information about how to care for yourself after your procedure. Your health care provider may also give you more specific instructions. If you have problems or questions, contact your health care provider. What can I expect after the procedure? After the procedure, it is common to have:  Bruising and tenderness at the catheter insertion area. Follow these instructions at home: Medicines  Take over-the-counter and prescription medicines only as told by your health care provider. Insertion site care  Follow instructions from your health care provider about how to take care of your insertion site. Make sure you: ? Wash your hands with soap and water before you change your bandage (dressing). If soap and water are not available, use hand sanitizer. ? Remove your dressing as told by your health care provider. In 24-48 hours  Check your insertion site every day for signs of infection. Check for: ? Redness, swelling, or pain. ? Fluid or blood. ? Pus or a bad smell. ? Warmth.  Do not take baths, swim, or use a hot tub until your health care provider approves.  You may shower 24-48 hours after the procedure, or as directed by your health care provider. ? Remove the dressing and gently wash the site with plain soap and water. ? Pat the area dry with a clean towel. ? Do not rub the site. That could cause bleeding.  Do not apply powder or lotion to the site. Activity   For 24 hours after the procedure, or as directed by your health care provider: ? Do not flex or bend the affected arm. ? Do not push or pull heavy objects with the affected arm. ? Do not drive yourself home from the hospital or clinic. You may drive 24 hours after the procedure unless your health care provider tells you not to. ? Do not operate machinery or power tools.  Do not lift  anything that is heavier than 5-10 lb, or the limit that you are told, until your health care provider says that it is safe. For 4 days  Ask your health care provider when it is okay to: ? Return to work or school. ? Resume usual physical activities or sports. ? Resume sexual activity. General instructions  If the catheter site starts to bleed, raise your arm and put firm pressure on the site. If the bleeding does not stop, get help right away. This is a medical emergency.  If you went home on the same day as your procedure, a responsible adult should be with you for the first 24 hours after you arrive home.  Keep all follow-up visits as told by your health care provider. This is important. Contact a health care provider if:  You have a fever.  You have redness, swelling, or yellow drainage around your insertion site. Get help right away if:  You have unusual pain at the radial site.  The catheter insertion area swells very fast.  The insertion area is bleeding, and the bleeding does not stop when you hold steady pressure on the area.  Your arm or hand becomes pale, cool, tingly, or numb. These symptoms may represent a serious problem that is an emergency. Do not wait to see if the symptoms will go away. Get medical help right away. Call your local emergency services (911 in the [place] Do not drive yourself  to the hospital. Summary  After the procedure, it is common to have bruising and tenderness at the site.  Follow instructions from your health care provider about how to take care of your radial site wound. Check the wound every day for signs of infection.  Do not lift anything that is heavier than 10 lb (4.5 kg), or the limit that you are told, until your health care provider says that it is safe. This information is not intended to replace advice given to you by your health care provider. Make sure you discuss any questions you have with your health care provider. Document  Released: 12/05/2010 Document Revised: 12/08/2017 Document Reviewed: 12/08/2017 Elsevier Interactive Patient Education  2019 [employer].

## 2019-01-10 NOTE — Interval H&P Note (Signed)
History and Physical Interval Note:  01/10/2019 8:31 AM  [name] [name]  has presented today for surgery, with the diagnosis of cad  The various methods of treatment have been discussed with the patient and family. After consideration of risks, benefits and other options for treatment, the patient has consented to  Procedure(s): LEFT HEART CATH AND CORONARY ANGIOGRAPHY (N/A) as a surgical intervention .  The patient's history has been reviewed, patient examined, no change in status, stable for surgery.  I have reviewed the patient's chart and labs.  Questions were answered to the patient's satisfaction.   Cath Lab Visit (complete for each Cath Lab visit)  Clinical Evaluation Leading to the Procedure:   ACS: Yes.    Non-ACS:    Anginal Classification: CCS III  Anti-ischemic medical therapy: No Therapy  Non-Invasive Test Results: No non-invasive testing performed  Prior CABG: No previous CABG       Theron Arista [facility] 01/10/2019 8:32 AM

## 2019-01-10 NOTE — Progress Notes (Signed)
Pt started bleeding about 10 minute after band removed and dressing was placed. RN held pressure for 25 minutes. Site now level 0, new dressing applied. Will wait 30 min to D/C.

## 2019-01-11 ENCOUNTER — Encounter (HOSPITAL_COMMUNITY): Payer: Self-pay | Admitting: Cardiology

## 2019-01-24 ENCOUNTER — Ambulatory Visit: Payer: Medicare Other | Admitting: Physician Assistant

## 2019-01-24 NOTE — Progress Notes (Deleted)
Cardiology Office Note:    Date:  01/24/2019   ID:  TYRANNY HELMKE, DOB 1941-02-25, MRN [id number]  PCP:  Irena Reichmann, DO  Cardiologist:  Kristeen Miss, MD *** Electrophysiologist:  None   Referring MD: Irena Reichmann, DO   No chief complaint on file. ***  History of Present Illness:    [name] [name] is a 78 y.o. female with  coronary artery disease status post remote myocardial infarction treated with angioplasty to the LCx in August 1997, diabetes, hyperlipidemia, rheumatoid arthritis, carotid stenosis.  I saw her recently due to symptoms of exertional chest discomfort worrisome for angina pectoris.  Cardiac catheterization was arranged.  This demonstrated 80% distal LAD stenosis and segmental 85% proximal RCA stenosis (small caliber vessel).  The RCA is not ideal for PCI given its small caliber.  Anatomy was compared to films from 2004 and there had not been significant change.  Therefore medical therapy was recommended.  It was felt that PCI of the distal LAD could be considered if she has refractory angina on optimal guideline directed medical therapy.  ***  Ms. Pralle ***  Prior CV studies:   The following studies were reviewed today:  Cardiac catheterization 01/10/2019 LM luminal irregularities LAD proximal 30, distal 80 LCx ostial 40 RCA proximal 85 (small vessel) EF 55-65 LVEDP 15 CONCLUSION: 1. 2 vessel obstructive CAD    - 80% distal LAD stenosis    - segmental 85% proximal RCA. This is a small caliber vessel 2. Normal LV function 3. Normal LVEDP  Plan: compared to 2004 the RCA is unchanged. There has been some progression of distal LAD disease. The ostial LCx is improved. Since the RCA is small in caliber it is not ideal for PCI. Given stability of disease since 2004 I would recommend medical therapy ( currently on no antianginal therapy ).  Her symptoms are also atypical. If she has refractory angina despite optimal guideline directed medical therapy we could  consider PCI of the distal LAD. I would leave the RCA alone given stability since 2004. Will add Toprol XL 25 mg daily and arrange follow up.   GXT 07/08/2016 Patient only achieved 86% of PMHR 1 mm Horizontal ST segment depression in lateral leads peak stress and recovery  Suggest f/u perfusion imaging   Echo 05/08/2015 EF 55-60, normal wall motion, grade 1 diastolic dysfunction  Myoview 3/64/6803 EF 75, no ischemia, normal study  Carotid US 09/23/2010 Bilateral ICA 0-39  Cardiac catheterization 12/26/02 LM distal 20 LAD proximal-mid 30, distal 60 LCx ostial 80, mid to distal 30 RCA proximal 75/80, distal 60 EF 65-70 LCx not optimal for PCI, RCA small >>medical therapy  Past Medical History:  Diagnosis Date  . CAD (coronary artery disease)    Catheterization 2004, 60% distal LAD, 80% ostial circumflex( not optimal for PCI), 70% small RCA, medical therapy  /  nuclear June, 2005, EF 65%, no ischemia  . Carotid artery disease (HCC)    Doppler, November, 2011, no significant plaque, distal R. ICA velocities are elevated and could be source of bruit, 0-39% bilateral  . Diabetes mellitus   . Drug therapy    Intermittent steroid use  . Dyslipidemia   . Edema   . Ejection fraction    EF 60%, echo, November, 2011, trivial pericardial effusion  . Rheumatoid arthritis(714.0)    Hospitalization August, 2011, severe RA flare,   . Statin intolerance    Surgical Hx: The patient  has a past surgical history that includes Cholecystectomy;  Abdominal hysterectomy; and LEFT HEART CATH AND CORONARY ANGIOGRAPHY (N/A, 01/10/2019).   Current Medications: No outpatient medications have been marked as taking for the 01/24/19 encounter (Appointment) with Tereso Newcomer T, PA-C.     Allergies:   Cholesterol; Hydrocodone; Statins; and Metformin and related   Social History   Tobacco Use  . Smoking status: Former Smoker    Last attempt to quit: 11/16/1994    Years since quitting: 24.2  .  Smokeless tobacco: Never Used  Substance Use Topics  . Alcohol use: No    Alcohol/week: 0.0 standard drinks  . Drug use: No     Family Hx: The patient's family history includes Diabetes in her mother; Hypertension in her mother; Other in her father.  ROS:   Please see the history of present illness.    ROS All other systems reviewed and are negative.   EKGs/Labs/Other Test Reviewed:    EKG:  EKG is *** ordered today.  The ekg ordered today demonstrates ***  Recent Labs: 01/04/2019: BUN 11; Creatinine, Ser 0.86; Hemoglobin 11.7; Platelets 247; Potassium 4.1; Sodium 140   Recent Lipid Panel No results found for: CHOL, TRIG, HDL, CHOLHDL, LDLCALC, LDLDIRECT  Physical Exam:    VS:  There were no vitals taken for this visit.    Wt Readings from Last 3 Encounters:  01/10/19 145 lb (65.8 kg)  01/04/19 146 lb (66.2 kg)  05/12/18 155 lb (70.3 kg)     ***Physical Exam  ASSESSMENT & PLAN:    No diagnosis found.*** Coronary artery disease involving native coronary artery of native heart with angina pectoris (HCC) History of remote myocardial infarction in 1997 treated with angioplasty to the left circumflex.  Cardiac catheterization 16 years ago demonstrated diffuse three-vessel disease.  She had an ostial LCx 80% stenosis and proximal RCA 80% stenosis.  Medical therapy was recommended at that time.  Nuclear stress test in 2014 was negative for ischemia.  ECG today does not demonstrate any acute findings.  However, she has recently had exertional symptoms which are new for her.  She has relief with nitroglycerin.  She has not used nitroglycerin in the past.  I have recommended proceeding with cardiac catheterization to further evaluate her symptoms.  I discussed this with Dr. Elease Hashimoto, who agreed.  Risks and benefits of cardiac catheterization have been discussed with the patient.  These include bleeding, infection, kidney damage, stroke, heart attack, death.  The patient understands these  risks and is willing to proceed.              -Arrange L heart cath             -Continue ASA             -She is intol of statins             -Rx for NTG prn             -Labs: BMET, CBC             -FU 2 weeks post cath  Mixed hyperlipidemia Intol of statins.  Consider referral to [facility] for PCSK9 inhibitor.  Type 2 diabetes mellitus without complication, with long-term current use of insulin (HCC) She takes regular insulin at meal times. Managed by primary care.  Empagliflozin could be considered given the results of the EMPA-REG OUTCOME trial.   Dysphagia, unspecified type I have asked her to follow up with her gastroenterologist. Dispo:  No follow-ups on file.   Medication Adjustments/Labs and  Tests Ordered: Current medicines are reviewed at length with the patient today.  Concerns regarding medicines are outlined above.  Tests Ordered: No orders of the defined types were placed in this encounter.  Medication Changes: No orders of the defined types were placed in this encounter.   Signed, Tereso NewcomerScott Erven Ramson, PA-C  01/24/2019 10:51 AM    [facility] [address], [place], [place]  [id number] Phone: [phone]) [phone]; Fax: [phone]

## 2019-01-25 ENCOUNTER — Encounter: Payer: Self-pay | Admitting: Physician Assistant

## 2019-03-06 ENCOUNTER — Telehealth: Payer: Self-pay | Admitting: Physician Assistant

## 2019-03-06 NOTE — Telephone Encounter (Signed)
VIDEO/Doximity visit on 03/13/2019. Patient has smartphone.     Phone Call to obtain consent   -  03/06/2019         Virtual Visit Pre-Appointment Phone Call  Steps For Call:  1. Confirm consent - "In the setting of the current Covid19 crisis, you are scheduled for a (phone or video) visit with your provider on (date) at (time).  Just as we do with many in-office visits, in order for you to participate in this visit, we must obtain consent.  If you'd like, I can send this to your mychart (if signed up) or email for you to review.  Otherwise, I can obtain your verbal consent now.  All virtual visits are billed to your insurance company just like a normal visit would be.  By agreeing to a virtual visit, we'd like you to understand that the technology does not allow for your provider to perform an examination, and thus may limit your provider's ability to fully assess your condition. If your provider identifies any concerns that need to be evaluated in person, we will make arrangements to do so.  Finally, though the technology is pretty good, we cannot assure that it will always work on either your or our end, and in the setting of a video visit, we may have to convert it to a phone-only visit.  In either situation, we cannot ensure that we have a secure connection.  Are you willing to proceed?" STAFF: Did the patient verbally acknowledge consent to telehealth visit? Document YES/NO here: YES   2. Confirm the BEST phone number to call the day of the visit by including in appointment notes  3. Give patient instructions for WebEx/MyChart download to smartphone as below or Doximity/Doxy.me if video visit (depending on what platform provider is using)  4. Advise patient to be prepared with their blood pressure, heart rate, weight, any heart rhythm information, their current medicines, and a piece of paper and pen handy for any instructions they may receive the day of their visit  5. Inform  patient they will receive a phone call 15 minutes prior to their appointment time (may be from unknown caller ID) so they should be prepared to answer  6. Confirm that appointment type is correct in Epic appointment notes (VIDEO vs PHONE)     TELEPHONE CALL NOTE  [name] [name] has been deemed a candidate for a follow-up tele-health visit to limit community exposure during the Covid-19 pandemic. I spoke with the patient via phone to ensure availability of phone/video source, confirm preferred email & phone number, and discuss instructions and expectations.  I reminded [name] [name] to be prepared with any vital sign and/or heart rhythm information that could potentially be obtained via home monitoring, at the time of her visit. I reminded [name] [name] to expect a phone call at the time of her visit if her visit.  [name] 03/06/2019 2:20 PM   INSTRUCTIONS FOR DOWNLOADING THE WEBEX APP TO SMARTPHONE  - If Apple, ask patient to go to App Store and type in WebEx in the search bar. Download [employer], the blue/green circle. If Android, go to [employer] and type in [employer] in the search bar. The app is free but as with any other app downloads, their phone may require them to verify saved payment information or Apple/Android password.  - The patient does NOT have to create an account. - On the day of the visit, the  assist will walk the patient through joining the meeting with the meeting number/password.  INSTRUCTIONS FOR DOWNLOADING THE MYCHART APP TO SMARTPHONE  - The patient must first make sure to have activated MyChart and know their login information - If Apple, go to [employer] Store and type in MyChart in the search bar and download the app. If Android, ask patient to go to [employer] Play Store and type in [place] in the search bar and download the app. The app is free but as with any other app downloads, their phone may require them to verify saved payment information or  Apple/Android password.  - The patient will need to then log into the app with their MyChart username and password, and select [facility] as their healthcare provider to link the account. When it is time for your visit, go to the MyChart app, find appointments, and click Begin Video Visit. Be sure to Select Allow for your device to access the Microphone and Camera for your visit. You will then be connected, and your provider will be with you shortly.  **If they have any issues connecting, or need assistance please contact MyChart service desk [phone])**  **If using a computer, in order to ensure the best quality for their visit they will need to use either of the following Internet Browsers: [employer] Edge, or Google Chrome**  IF USING DOXIMITY or DOXY.ME - The patient will receive a link just prior to their visit, either by text or email (to be determined day of appointment depending on if it's doxy.me or Doximity).     FULL LENGTH CONSENT FOR TELE-HEALTH VISIT   I hereby voluntarily request, consent and authorize [facility] and its employed or contracted physicians, physician assistants, nurse practitioners or other licensed health care professionals (the Practitioner), to provide me with telemedicine health care services (the Services") as deemed necessary by the treating Practitioner. I acknowledge and consent to receive the Services by the Practitioner via telemedicine. I understand that the telemedicine visit will involve communicating with the Practitioner through live audiovisual communication technology and the disclosure of certain medical information by electronic transmission. I acknowledge that I have been given the opportunity to request an in-person assessment or other available alternative prior to the telemedicine visit and am voluntarily participating in the telemedicine visit.  I understand that I have the right to withhold or withdraw my consent to the use  of telemedicine in the course of my care at any time, without affecting my right to future care or treatment, and that the Practitioner or I may terminate the telemedicine visit at any time. I understand that I have the right to inspect all information obtained and/or recorded in the course of the telemedicine visit and may receive copies of available information for a reasonable fee.  I understand that some of the potential risks of receiving the Services via telemedicine include:   Delay or interruption in medical evaluation due to technological equipment failure or disruption;  Information transmitted may not be sufficient (e.g. poor resolution of images) to allow for appropriate medical decision making by the Practitioner; and/or   In rare instances, security protocols could fail, causing a breach of personal health information.  Furthermore, I acknowledge that it is my responsibility to provide information about my medical history, conditions and care that is complete and accurate to the best of my ability. I acknowledge that Practitioner's advice, recommendations, and/or decision may be based on factors not within their control, such as incomplete or  inaccurate data provided by me or distortions of diagnostic images or specimens that may result from electronic transmissions. I understand that the practice of medicine is not an exact science and that Practitioner makes no warranties or guarantees regarding treatment outcomes. I acknowledge that I will receive a copy of this consent concurrently upon execution via email to the email address I last provided but may also request a printed copy by calling the office of [facility].    I understand that my insurance will be billed for this visit.   I have read or had this consent read to me.  I understand the contents of this consent, which adequately explains the benefits and risks of the Services being provided via telemedicine.   I have been  provided ample opportunity to ask questions regarding this consent and the Services and have had my questions answered to my satisfaction.  I give my informed consent for the services to be provided through the use of telemedicine in my medical care  By participating in this telemedicine visit I agree to the above.

## 2019-03-12 NOTE — Progress Notes (Signed)
Virtual Visit via Telephone Note   This visit type was conducted due to national recommendations for restrictions regarding the COVID-19 Pandemic (e.g. social distancing) in an effort to limit this patient's exposure and mitigate transmission in our community.  Due to her co-morbid illnesses, this patient is at least at moderate risk for complications without adequate follow up.  This format is felt to be most appropriate for this patient at this time.  The patient did not have access to video technology/had technical difficulties with video requiring transitioning to audio format only (telephone).  All issues noted in this document were discussed and addressed.  No physical exam could be performed with this format.  Please refer to the patient's chart for her  consent to telehealth [facility].   Evaluation Performed:  Follow-up visit  Date:  03/13/2019   ID:  [name] [name], DOB Dec 08, 1940, MRN [id number]  Patient Location: Home Provider Location: Home  PCP:  Irena Reichmannollins, Dana, DO  Cardiologist:  Kristeen MissPhilip Nahser, MD   Electrophysiologist:  None   Chief Complaint: Follow-up on coronary artery disease, status post cardiac catheterization  History of Present Illness:    [name] [name] is a 78 y.o. female with coronary artery disease status post remote myocardial infarction treated with angioplasty to the LCx in August 1997, diabetes, hyperlipidemia, rheumatoid arthritis, carotid stenosis.  She was previously followed by Dr. Myrtis SerKatz.  She was last seen in clinic in 12/2018.  At that time she was having exertional, NTG responsive, chest pain.  She was set up for Cardiac Catheterization.  This demonstrated 2 vessel CAD with 80% distal LAD stenosis and segmental 85% proximal RCA stenosis (small caliber).  The RCA disease was unchanged since 2004 and there was some progression in the LAD.  The RCA was not ideal for PCI.  Medical Rx was recommended unless she has refractory angina despite optimal  guideline directed medical therapy.  At that point, PCI of the distal LAD could be considered.  She was started on beta-blocker therapy (Toprol).    Today, she notes diffuse arthralgias and starting metoprolol succinate.  However, her symptoms are improving.  She has noted less anginal symptoms.  She has only taken nitroglycerin twice.  She typically notes palpitations and shortness of breath with angina.  Otherwise, she has not had any other shortness of breath.  She has not had syncope, orthopnea or lower extremity swelling.  The patient does not have symptoms concerning for COVID-19 infection (fever, chills, cough, or new shortness of breath).    Past Medical History:  Diagnosis Date  . CAD (coronary artery disease)    Catheterization 2004, 60% distal LAD, 80% ostial circumflex( not optimal for PCI), 70% small RCA, medical therapy  /  nuclear June, 2005, EF 65%, no ischemia  . Carotid artery disease (HCC)    Doppler, November, 2011, no significant plaque, distal R. ICA velocities are elevated and could be source of bruit, 0-39% bilateral  . Diabetes mellitus   . Drug therapy    Intermittent steroid use  . Dyslipidemia   . Edema   . Ejection fraction    EF 60%, echo, November, 2011, trivial pericardial effusion  . Rheumatoid arthritis(714.0)    Hospitalization August, 2011, severe RA flare,   . Statin intolerance    Past Surgical History:  Procedure Laterality Date  . ABDOMINAL HYSTERECTOMY    . CHOLECYSTECTOMY    . LEFT HEART CATH AND CORONARY ANGIOGRAPHY N/A 01/10/2019   Procedure: LEFT HEART CATH  AND CORONARY ANGIOGRAPHY;  Surgeon: [place], Peter M, MD;  Location: [facility] INVASIVE CV LAB;  Service: Cardiovascular;  Laterality: N/A;     Current Meds  Medication Sig  . Abatacept (ORENCIA) 125 MG/ML SOSY Inject 75 mg into the skin every 30 (thirty) days.  [name] aspirin 81 MG tablet Take 1 tablet (81 mg total) by mouth daily.  . B-D UF III MINI PEN NEEDLES 31G X 5 MM MISC USE WITH INSULIN  TO INJECT BID UTD  . Cholecalciferol (VITAMIN D-3) 1000 units CAPS Take 1,000 Units by mouth daily.   . folic acid (FOLVITE) 1 MG tablet Take 1 mg by mouth every evening.   . furosemide (LASIX) 40 MG tablet Take 40 mg by mouth daily as needed (fluid retention.).  [name] HUMALOG MIX 75/25 KWIKPEN (75-25) 100 UNIT/ML Kwikpen INJECT 5 UNITS BEFORE MEALS BID  . insulin lispro (HUMALOG) 100 UNIT/ML injection Inject 3-5 Units into the skin See admin instructions. 5 units in the morning before breakfast & 3 units in the evening before supper  . losartan (COZAAR) 50 MG tablet Take 50 mg by mouth daily.  . methotrexate (RHEUMATREX) 2.5 MG tablet Take 15 mg by mouth every Monday. 6 tabs/Mondays  . metoprolol succinate (TOPROL XL) 25 MG 24 hr tablet Take 1 tablet (25 mg total) by mouth daily.  . Multiple Vitamin (MULTIVITAMIN WITH MINERALS) TABS tablet Take 1 tablet by mouth daily. Centrum silver  . nitroGLYCERIN (NITROSTAT) 0.4 MG SL tablet Place 1 tablet (0.4 mg total) under the tongue every 5 (five) minutes as needed for chest pain.  Letta Pate VERIO test strip USE 1 STRIP TID UTD  . pantoprazole (PROTONIX) 40 MG tablet Take 40 mg by mouth daily.     Allergies:   Cholesterol; Hydrocodone; Statins; and Metformin and related   Social History   Tobacco Use  . Smoking status: Former Smoker    Last attempt to quit: 11/16/1994    Years since quitting: 24.3  . Smokeless tobacco: Never Used  Substance Use Topics  . Alcohol use: No    Alcohol/week: 0.0 standard drinks  . Drug use: No     Family Hx: The patient's family history includes Diabetes in her mother; Hypertension in her mother; Other in her father.  ROS:   Please see the history of present illness.     All other systems reviewed and are negative.   Prior CV studies:   The following studies were reviewed today:   Cardiac Catheterization 01/10/2019 LM irregs LAD prox 30, dist 80 LCx ost 40 RCA (small) prox 85 EF 55-65    GXT  07/08/2016 Patient only achieved 86% of PMHR 1 mm Horizontal ST segment depression in lateral leads peak stress and recovery  Suggest f/u perfusion imaging    Echo 05/08/2015 EF 55-60, normal wall motion, grade 1 diastolic dysfunction   Myoview 08/09/2013 EF 75, no ischemia, normal study   Carotid US 09/23/2010 Bilateral ICA 0-39   Cardiac catheterization 12/26/02 LM distal 20 LAD proximal-mid 30, distal 60 LCx ostial 80, mid to distal 30 RCA proximal 75/80, distal 60 EF 65-70 LCx not optimal for PCI, RCA small >>medical therapy  Labs/Other Tests and Data Reviewed:    EKG:  No ECG reviewed.  Recent Labs: 01/04/2019: BUN 11; Creatinine, Ser 0.86; Hemoglobin 11.7; Platelets 247; Potassium 4.1; Sodium 140   Recent Lipid Panel No results found for: CHOL, TRIG, HDL, CHOLHDL, LDLCALC, LDLDIRECT   From [facility] [employer]     Wt Readings from  Last 3 Encounters:  03/13/19 145 lb (65.8 kg)  01/10/19 145 lb (65.8 kg)  01/04/19 146 lb (66.2 kg)     Objective:    Vital Signs:  BP 112/70   Pulse (!) 54   Ht 5\' 2"  (1.575 m)   Wt 145 lb (65.8 kg)   BMI 26.52 kg/m    VITAL SIGNS:  reviewed GEN:  no acute distress RESPIRATORY:  No labored breathing noted during our conversation NEURO:  Alert and oriented PSYCH:  She seems to be in good spirits  ASSESSMENT & PLAN:    Coronary artery disease involving native coronary artery of native heart with angina pectoris (HCC)  History of remote myocardial infarction in 1997 treated with angioplasty to the left circumflex.  Recent cardiac catheterization demonstrated severe distal LAD stenosis and segmental 85% stenosis in the proximal RCA which is a small caliber vessel.  The RCA is not amenable to PCI.  Medical therapy has been recommended unless she has refractory angina.  In that case, PCI of the LAD could be considered.  She is currently doing well without significant anginal symptoms.  She has only had to use nitroglycerin twice since starting  metoprolol succinate.  However, she does have diffuse arthralgias.  She does have a diagnosis of rheumatoid arthritis.  Question if beta-blocker therapy has somehow made her arthralgias from arthritis worse.  However, she does note her symptoms are better.  I have encouraged her to continue on metoprolol for now.  If her symptoms continue to be bothersome, we could consider changing metoprolol succinate to isosorbide or amlodipine.  Continue aspirin.  She is intolerant of statins.  Plan follow-up in 3 months.  Mixed hyperlipidemia  We discussed the possibility of PCSK9 inhibitor therapy versus starting ezetimibe.  She is willing to try ezetimibe.  If she develops arthralgias or significant GI side effect from this, I will refer her to our pharmacy clinic for initiation of PCSK9 inhibitor therapy.  -Ezetimibe 10 mg daily  -Fasting lipids, LFTs in 3 months  Rheumatoid arthritis, involving unspecified site, unspecified rheumatoid factor presence (HCC) She has monthly injections of abatacept.  COVID-19 Education: The signs and symptoms of COVID-19 were discussed with the patient and how to seek care for testing (follow up with PCP or arrange E-visit).  The importance of social distancing was discussed today.  Time:   Today, I have spent 23 minutes with the patient with telehealth technology discussing the above problems.     Medication Adjustments/Labs and Tests Ordered: Current medicines are reviewed at length with the patient today.  Concerns regarding medicines are outlined above.   Tests Ordered: Orders Placed This Encounter  Procedures  . Hepatic function panel  . Lipid panel    Medication Changes: Meds ordered this encounter  Medications  . ezetimibe (ZETIA) 10 MG tablet    Sig: Take 1 tablet (10 mg total) by mouth daily.    Dispense:  30 tablet    Refill:  11    Order Specific Question:   Supervising Provider    Answer:   Lewayne Bunting [[id number]]    Disposition:  Follow up in  3 month(s)  Signed, Tereso Newcomer, PA-C  03/13/2019 1:41 PM    [facility]

## 2019-03-13 ENCOUNTER — Other Ambulatory Visit: Payer: Self-pay

## 2019-03-13 ENCOUNTER — Telehealth (INDEPENDENT_AMBULATORY_CARE_PROVIDER_SITE_OTHER): Payer: Medicare Other | Admitting: Physician Assistant

## 2019-03-13 ENCOUNTER — Encounter: Payer: Self-pay | Admitting: Physician Assistant

## 2019-03-13 VITALS — BP 112/70 | HR 54 | Ht 62.0 in | Wt 145.0 lb

## 2019-03-13 DIAGNOSIS — E782 Mixed hyperlipidemia: Secondary | ICD-10-CM

## 2019-03-13 DIAGNOSIS — I25119 Atherosclerotic heart disease of native coronary artery with unspecified angina pectoris: Secondary | ICD-10-CM | POA: Diagnosis not present

## 2019-03-13 DIAGNOSIS — Z7189 Other specified counseling: Secondary | ICD-10-CM

## 2019-03-13 DIAGNOSIS — M069 Rheumatoid arthritis, unspecified: Secondary | ICD-10-CM

## 2019-03-13 MED ORDER — EZETIMIBE 10 MG PO TABS: 10.0000 mg | ORAL_TABLET | Freq: Every day | ORAL | 11 refills | Status: DC

## 2019-03-13 NOTE — Patient Instructions (Signed)
Medication Instructions:  Start Zetia (Ezetimibe) 10 mg once daily for cholesterol - I have sent a prescription to your pharmacy.  If you need a refill on your cardiac medications before your next appointment, please call your pharmacy.   Lab work: In 3 mos - fasting Lipids, LFTs  If you have labs (blood work) drawn today and your tests are completely normal, you will receive your results only by: [name] MyChart Message (if you have MyChart) OR . A paper copy in the mail If you have any lab test that is abnormal or we need to change your treatment, we will call you to review the results.  Testing/Procedures: None   Follow-Up: At [facility], you and your health needs are our priority.  As part of our continuing mission to provide you with exceptional heart care, we have created designated Provider Care Teams.  These Care Teams include your primary Cardiologist (physician) and Advanced Practice Providers (APPs -  Physician Assistants and Nurse Practitioners) who all work together to provide you with the care you need, when you need it. You will need a follow up appointment in:  3 months.  Please call our office 2 months in advance to schedule this appointment.  You may see Kristeen Miss, MD or Tereso Newcomer, PA-C   Any Other Special Instructions Will Be Listed Below (If Applicable).  Call us if you continue to have joint pains related to the Metoprolol

## 2019-03-15 ENCOUNTER — Telehealth: Payer: Self-pay | Admitting: Cardiovascular Disease

## 2019-03-15 ENCOUNTER — Telehealth: Payer: Self-pay | Admitting: *Deleted

## 2019-03-15 MED ORDER — ISOSORBIDE MONONITRATE ER 30 MG PO TB24: 30.0000 mg | ORAL_TABLET | Freq: Every day | ORAL | 11 refills | Status: DC

## 2019-03-15 NOTE — Telephone Encounter (Signed)
Stop Metoprolol Succinate.  I would like her to start taking Imdur 30 mg once daily instead. Send in Rx for Imdur 30 mg, take 1 once daily, #30, 11 refills.  She should wait 3-4 days after stopping the Metoprolol before she starts the Imdur.   If her joint pains do not improve off of the Metoprolol, call us before she starts the Imdur.  Tereso Newcomer, PA-C    03/15/2019 12:49 PM

## 2019-03-15 NOTE — Telephone Encounter (Signed)
New message    Pt c/o medication issue:  1. Name of Medication:metoprolol succinate (TOPROL XL) 25 MG 24 hr tablet  2. How are you currently taking this medication (dosage and times per day)?1 time daily  3. Are you having a reaction (difficulty breathing--STAT)?no  4. What is your medication issue? Patient states that she thinks that this medication is bothering her joints. Please call to discuss.

## 2019-03-15 NOTE — Telephone Encounter (Signed)
SPOKE WITH PT ABOUT JOINTS BEING BOTHERED WITH TAKING METOPROLOL  PT STATES SHE HAD INFUSION YESTERDAY  PT STATED  TWICE BEFORE THAT ( SHE FORGOT TO TELL SCOTT AT VISIT). SHE IS NOT FEELING WELL AS SHE SHOULD  AFTERWARDS WITH HER RA '  PT STATES SHE SHOULD FEEL BETTER WITH  RA SYMPTOMS AFTER INFUSON BUT DOES NOT   PT CONCERNED ABOUT CONTINUING TAKE TOPROL XL

## 2019-03-15 NOTE — Telephone Encounter (Signed)
SPOKE WITH PT AND PT AWARE OF STOPPING TOPROL XL AND STARTING IMDUR 30 MG ONCE A DAY  PT IS TO WAIT AT LEAST 3 TO 4 DAYS AFTER STOPPING  TOPROL XL  TO SEE IF ANY CHANGES  WITH JOINTS  BEFORE STARITNG IMDUR.  PT  IS TO CALL CLINIC BACK  IF NO CHANGES BEFORE PT  START IMDUR.  RX SENT IN 30 TABS  RF#[id number]

## 2019-06-02 ENCOUNTER — Other Ambulatory Visit: Payer: Self-pay

## 2019-06-02 ENCOUNTER — Other Ambulatory Visit: Payer: Medicare Other | Admitting: *Deleted

## 2019-06-02 DIAGNOSIS — I25119 Atherosclerotic heart disease of native coronary artery with unspecified angina pectoris: Secondary | ICD-10-CM

## 2019-06-02 DIAGNOSIS — E782 Mixed hyperlipidemia: Secondary | ICD-10-CM

## 2019-06-02 LAB — HEPATIC FUNCTION PANEL
ALT: 12 IU/L (ref 0–32)
AST: 22 IU/L (ref 0–40)
Albumin: 4.1 g/dL (ref 3.7–4.7)
Alkaline Phosphatase: 64 IU/L (ref 39–117)
Bilirubin Total: 0.5 mg/dL (ref 0.0–1.2)
Bilirubin, Direct: 0.15 mg/dL (ref 0.00–0.40)
Total Protein: 6.5 g/dL (ref 6.0–8.5)

## 2019-06-02 LAB — LIPID PANEL
Chol/HDL Ratio: 2.3 ratio (ref 0.0–4.4)
Cholesterol, Total: 159 mg/dL (ref 100–199)
HDL: 68 mg/dL (ref >39)
LDL Calculated: 84 mg/dL (ref 0–99)
Triglycerides: 36 mg/dL (ref 0–149)
VLDL Cholesterol Cal: 7 mg/dL (ref 5–40)

## 2019-06-05 ENCOUNTER — Encounter: Payer: Self-pay | Admitting: *Deleted

## 2019-06-05 DIAGNOSIS — E782 Mixed hyperlipidemia: Secondary | ICD-10-CM

## 2019-06-05 DIAGNOSIS — I25119 Atherosclerotic heart disease of native coronary artery with unspecified angina pectoris: Secondary | ICD-10-CM

## 2019-06-06 ENCOUNTER — Telehealth: Payer: Self-pay | Admitting: Physician Assistant

## 2019-06-06 NOTE — Telephone Encounter (Signed)
New Message ° ° ° °Left message to confirm appt and answer covid questions  °

## 2019-06-07 ENCOUNTER — Other Ambulatory Visit: Payer: Self-pay

## 2019-06-07 ENCOUNTER — Encounter: Payer: Self-pay | Admitting: Physician Assistant

## 2019-06-07 ENCOUNTER — Ambulatory Visit (INDEPENDENT_AMBULATORY_CARE_PROVIDER_SITE_OTHER): Payer: Medicare Other | Admitting: Physician Assistant

## 2019-06-07 VITALS — BP 126/70 | HR 63 | Ht 62.0 in | Wt 150.8 lb

## 2019-06-07 DIAGNOSIS — E782 Mixed hyperlipidemia: Secondary | ICD-10-CM

## 2019-06-07 DIAGNOSIS — I25119 Atherosclerotic heart disease of native coronary artery with unspecified angina pectoris: Secondary | ICD-10-CM

## 2019-06-07 NOTE — Progress Notes (Signed)
Cardiology Office Note:    Date:  06/07/2019   ID:  [name] [name], DOB September 10, 1941, MRN [id number]  PCP:  Irena Reichmannollins, Dana, DO  Cardiologist:  Kristeen MissPhilip Nahser, MD   Electrophysiologist:  None   Referring MD: Irena Reichmannollins, Dana, DO   Chief Complaint  Patient presents with  . Follow-up    CAD    History of Present Illness:    [name] [name] is a 78 y.o. female with:  Coronary artery disease   S/p MI 06/1996 tx with POBA to LCx  Cath 12/2018: dLAD 80, pRCA 85 (small) >> med Rx; PCI of dLAD only if refractory angina  Intol of Toprol (arthralgias) >> changed to Imdur  Diabetes mellitus  Rheumatoid arthritis   Carotid artery Dz  Hyperlipidemia  Statin intolerant; intol of Zetia >> referred to Lipid Clinic   [name] was last seen in April 2020 via telemedicine.  Since that time, she reported intolerance to metoprolol succinate.  Her antianginal therapy was changed to isosorbide mononitrate.  She also reported intolerance to ezetimibe.  She has been referred to the lipid clinic.  She returns for follow-up.  She is here alone.  She has not had further chest pain.  She has shortness of breath at times but this is unchanged.  She has not had syncope, near syncope, paroxysmal nocturnal dyspnea.  She has some ankle swelling.  She usually manages this with compression stockings.    Prior CV studies:   The following studies were reviewed today:  Cardiac Catheterization 01/10/2019 LM irregs LAD prox 30, dist 80 LCx ost 40 RCA (small) prox 85 EF 55-65    GXT 07/08/2016 Patient only achieved 86% of PMHR 1 mm Horizontal ST segment depression in lateral leads peak stress and recovery  Suggest f/u perfusion imaging  Echo 05/08/2015 EF 55-60, normal wall motion, grade 1 diastolic dysfunction  Myoview 8/65/78469/24/2014 EF 75, no ischemia, normal study  Carotid US 09/23/2010 Bilateral ICA 0-39  Cardiac catheterization 12/26/02 LM distal 20 LAD proximal-mid 30, distal 60 LCx ostial 80,  mid to distal 30 RCA proximal 75/80, distal 60 EF 65-70 LCx not optimal for PCI, RCA small >>medical therapy   Past Medical History:  Diagnosis Date  . CAD (coronary artery disease)    Catheterization 2004, 60% distal LAD, 80% ostial circumflex( not optimal for PCI), 70% small RCA, medical therapy  /  nuclear June, 2005, EF 65%, no ischemia  . Carotid artery disease (HCC)    Doppler, November, 2011, no significant plaque, distal R. ICA velocities are elevated and could be source of bruit, 0-39% bilateral  . Diabetes mellitus   . Drug therapy    Intermittent steroid use  . Dyslipidemia   . Edema   . Ejection fraction    EF 60%, echo, November, 2011, trivial pericardial effusion  . Rheumatoid arthritis(714.0)    Hospitalization August, 2011, severe RA flare,   . Statin intolerance    Surgical Hx: The patient  has a past surgical history that includes Cholecystectomy; Abdominal hysterectomy; and LEFT HEART CATH AND CORONARY ANGIOGRAPHY (N/A, 01/10/2019).   Current Medications: Current Meds  Medication Sig  . Abatacept (ORENCIA) 125 MG/ML SOSY Inject 75 mg into the skin every 30 (thirty) days.  [name]. aspirin 81 MG tablet Take 1 tablet (81 mg total) by mouth daily.  . B-D UF III MINI PEN NEEDLES 31G X 5 MM MISC USE WITH INSULIN TO INJECT BID UTD  . Cholecalciferol (VITAMIN D-3) 1000 units CAPS Take 1,000 Units  by mouth daily.   . folic acid (FOLVITE) 1 MG tablet Take 1 mg by mouth every evening.   . furosemide (LASIX) 40 MG tablet Take 40 mg by mouth daily as needed (fluid retention.).  [name] HUMALOG MIX 75/25 KWIKPEN (75-25) 100 UNIT/ML Kwikpen INJECT 5 UNITS BEFORE MEALS BID  . insulin lispro (HUMALOG) 100 UNIT/ML injection Inject 3-5 Units into the skin See admin instructions. 5 units in the morning before breakfast & 3 units in the evening before supper  . isosorbide mononitrate (IMDUR) 30 MG 24 hr tablet Take 1 tablet (30 mg total) by mouth daily.  [name] losartan (COZAAR) 50 MG tablet Take  50 mg by mouth daily.  . methotrexate (RHEUMATREX) 2.5 MG tablet Take 15 mg by mouth every Monday. 6 tabs/Mondays  . Multiple Vitamin (MULTIVITAMIN WITH MINERALS) TABS tablet Take 1 tablet by mouth daily. Centrum silver  . nitroGLYCERIN (NITROSTAT) 0.4 MG SL tablet Place 1 tablet (0.4 mg total) under the tongue every 5 (five) minutes as needed for chest pain.  Glory Rosebush VERIO test strip USE 1 STRIP TID UTD  . pantoprazole (PROTONIX) 40 MG tablet Take 40 mg by mouth daily.     Allergies:   Cholesterol, Hydrocodone, Statins, and Metformin and related   Social History   Tobacco Use  . Smoking status: Former Smoker    Quit date: 11/16/1994    Years since quitting: 24.5  . Smokeless tobacco: Never Used  Substance Use Topics  . Alcohol use: No    Alcohol/week: 0.0 standard drinks  . Drug use: No     Family Hx: The patient's family history includes Diabetes in her mother; Hypertension in her mother; Other in her father.  ROS:   Please see the history of present illness.    ROS All other systems reviewed and are negative.   EKGs/Labs/Other Test Reviewed:    EKG:  EKG is  ordered today.  The ekg ordered today demonstrates normal sinus rhythm, HR 63, normal axix, QTc 413 ms, no changes.   Recent Labs: 01/04/2019: BUN 11; Creatinine, Ser 0.86; Hemoglobin 11.7; Platelets 247; Potassium 4.1; Sodium 140 06/02/2019: ALT 12   Recent Lipid Panel Lab Results  Component Value Date/Time   CHOL 159 06/02/2019 09:23 AM   TRIG 36 06/02/2019 09:23 AM   HDL 68 06/02/2019 09:23 AM   CHOLHDL 2.3 06/02/2019 09:23 AM   LDLCALC 84 06/02/2019 09:23 AM    Physical Exam:    VS:  BP 126/70   Pulse 63   Ht 5\' 2"  (1.575 m)   Wt 150 lb 12.8 oz (68.4 kg)   SpO2 99%   BMI 27.58 kg/m     Wt Readings from Last 3 Encounters:  06/07/19 150 lb 12.8 oz (68.4 kg)  03/13/19 145 lb (65.8 kg)  01/10/19 145 lb (65.8 kg)     Physical Exam  Constitutional: She is oriented to person, place, and time. She  appears well-developed and well-nourished. No distress.  HENT:  Head: Normocephalic and atraumatic.  Eyes: No scleral icterus.  Neck: No JVD present. No thyromegaly present.  Cardiovascular: Normal rate and regular rhythm.  Murmur heard.  Low-pitched early systolic murmur is present with a grade of 2/6. Pulmonary/Chest: Effort normal and breath sounds normal. She has no rales.  Abdominal: Soft. There is no hepatomegaly.  Musculoskeletal:        General: Edema (trace bilateral ankle edema) present.  Lymphadenopathy:    She has no cervical adenopathy.  Neurological: She is alert  and oriented to person, place, and time.  Skin: Skin is warm and dry.  Psychiatric: She has a normal mood and affect.    ASSESSMENT & PLAN:    1. Coronary artery disease involving native coronary artery of native heart with angina pectoris (HCC) History of remote myocardial infarction in 1997 treated with angioplasty to the left circumflex.  Cardiac catheterization in Feb 2020 demonstrated severe distal LAD stenosis and segmental 85% stenosis in the proximal RCA which is a small caliber vessel.  The RCA is not amenable to PCI.  Medical therapy has been recommended unless she has refractory angina.  In that case, PCI of the LAD could be considered.  She could not tolerate beta-blocker Rx.  She is currently doing well on Isosorbide.  Continue ASA, nitrates.  She is intol of statins and Ezetimibe.    2. Mixed hyperlipidemia Intol to statins, ezetimibe.  She has been referred to the Lipid Clinic for alternative management.      Dispo:  Return in about 6 months (around 12/08/2019) for Routine Follow Up, w/ Tereso Newcomer, PA-C.   Medication Adjustments/Labs and Tests Ordered: Current medicines are reviewed at length with the patient today.  Concerns regarding medicines are outlined above.  Tests Ordered: Orders Placed This Encounter  Procedures  . EKG 12-Lead   Medication Changes: No orders of the defined types  were placed in this encounter.   Signed, Tereso Newcomer, PA-C  06/07/2019 12:53 PM    [facility] [address] [place], [place], [place]  [address] Phone: [phone]; Fax: [phone]

## 2019-06-07 NOTE — Telephone Encounter (Signed)

## 2019-06-07 NOTE — Patient Instructions (Signed)
Medication Instructions:  Your physician recommends that you continue on your current medications as directed. Please refer to the Current Medication list given to you today.   If you need a refill on your cardiac medications before your next appointment, please call your pharmacy.   Lab work: NONE ORDERED  TODAY   If you have labs (blood work) drawn today and your tests are completely normal, you will receive your results only by: . MyChart Message (if you have MyChart) OR . A paper copy in the mail If you have any lab test that is abnormal or we need to change your treatment, we will call you to review the results.  Testing/Procedures: NONE ORDERED  TODAY   Follow-Up: At [facility], you and your health needs are our priority.  As part of our continuing mission to provide you with exceptional heart care, we have created designated Provider Care Teams.  These Care Teams include your primary Cardiologist (physician) and Advanced Practice Providers (APPs -  Physician Assistants and Nurse Practitioners) who all work together to provide you with the care you need, when you need it. You will need a follow up appointment in:  6 months.  Please call our office 2 months in advance to schedule this appointment.  You may see Philip Nahser, MD or one of the following Advanced Practice Providers on your designated Care Team: Scott Weaver, PA-C Vin Bhagat, PA-C . Janine Hammond, NP  Any Other Special Instructions Will Be Listed Below (If Applicable).    

## 2019-07-25 ENCOUNTER — Telehealth: Payer: Self-pay | Admitting: Internal Medicine

## 2019-07-25 ENCOUNTER — Encounter: Payer: Self-pay | Admitting: Internal Medicine

## 2019-07-25 ENCOUNTER — Telehealth (INDEPENDENT_AMBULATORY_CARE_PROVIDER_SITE_OTHER): Payer: Medicare Other | Admitting: Internal Medicine

## 2019-07-25 VITALS — BP 145/72 | Temp 96.4°F | Ht 62.0 in | Wt 150.0 lb

## 2019-07-25 DIAGNOSIS — E119 Type 2 diabetes mellitus without complications: Secondary | ICD-10-CM

## 2019-07-25 DIAGNOSIS — E782 Mixed hyperlipidemia: Secondary | ICD-10-CM

## 2019-07-25 DIAGNOSIS — Z794 Long term (current) use of insulin: Secondary | ICD-10-CM

## 2019-07-25 DIAGNOSIS — I25119 Atherosclerotic heart disease of native coronary artery with unspecified angina pectoris: Secondary | ICD-10-CM

## 2019-07-25 DIAGNOSIS — E785 Hyperlipidemia, unspecified: Secondary | ICD-10-CM

## 2019-07-25 DIAGNOSIS — Z789 Other specified health status: Secondary | ICD-10-CM

## 2019-07-25 NOTE — Telephone Encounter (Signed)
Patient called to review e-visit instructions. Patient aware that the following changes have been made: start repatha 140mg /ml once every 2 weeks Patient aware that they will need the following labs: fasting lab work in 3 months to check cholesterol Patient aware that they will need the following test(s): none  Lipid clinic f/up scheduled for 10/30/19 @ 10am  No further assistance needed at this time.

## 2019-07-25 NOTE — Progress Notes (Addendum)
Virtual Visit via Telephone Note   This visit type was conducted due to national recommendations for restrictions regarding the COVID-19 Pandemic (e.g. social distancing) in an effort to limit this patient's exposure and mitigate transmission in our community.  Due to her co-morbid illnesses, this patient is at least at moderate risk for complications without adequate follow up.  This format is felt to be most appropriate for this patient at this time.  The patient did not have access to video technology/had technical difficulties with video requiring transitioning to audio format only (telephone).  All issues noted in this document were discussed and addressed.  No physical exam could be performed with this format.  Please refer to the patient's chart for her  consent to telehealth for [facility].   Evaluation Performed: Telephone visit  Date:  07/25/2019   ID:  [name] [name], [name] [name] 26-Feb-1941, MRN [id number]  Patient Location:  [place] [address]  Provider location:   [address] [place], [place] [address]  PCP:  Janie Morning, DO  Cardiologist:  Mertie Moores, MD Electrophysiologist:  None   Chief Complaint:  Manage dyslipidemia  History of Present Illness:    [name] [name] is a 78 y.o. female who presents via audio/video conferencing for a telehealth visit today.  This is a pleasant 78 year old female with a history of rheumatoid arthritis, type 2 diabetes, dyslipidemia and statin intolerance as well as coronary artery disease.  She had some known disease of the right coronary artery in 2004 and recently had heart catheterization in February 2024 somewhat atypical chest pain.  She was found to have 85% proximal RCA stenosis of a small vessel as well as an 80% distal LAD stenosis and 30% proximal LAD stenosis.  Medical therapy was recommended at that time.  She continues to have some chest discomfort which she says may be related to elevations  in blood sugar and does get a little short of breath when walking some distances but denies any pain with exertion.  She also has mild bilateral carotid artery disease.  The patient does not have symptoms concerning for COVID-19 infection (fever, chills, cough, or new SHORTNESS OF BREATH).    Prior CV studies:   The following studies were reviewed today:  Lab work, chart reviewed  PMHx:  Past Medical History:  Diagnosis Date   CAD (coronary artery disease)    Catheterization 2004, 60% distal LAD, 80% ostial circumflex( not optimal for PCI), 70% small RCA, medical therapy  /  nuclear June, 2005, EF 65%, no ischemia   Carotid artery disease ([place])    Doppler, November, 2011, no significant plaque, distal R. ICA velocities are elevated and could be source of bruit, 0-39% bilateral   Diabetes mellitus    Drug therapy    Intermittent steroid use   Dyslipidemia    Edema    Ejection fraction    EF 60%, echo, November, 2011, trivial pericardial effusion   Rheumatoid arthritis(714.0)    Hospitalization August, 2011, severe RA flare,    Statin intolerance     Past Surgical History:  Procedure Laterality Date   ABDOMINAL HYSTERECTOMY     CHOLECYSTECTOMY     LEFT HEART CATH AND CORONARY ANGIOGRAPHY N/A 01/10/2019   Procedure: LEFT HEART CATH AND CORONARY ANGIOGRAPHY;  Surgeon: Martinique, Peter M, MD;  Location: [facility];  Service: Cardiovascular;  Laterality: N/A;    FAMHx:  Family History  Problem Relation Age of Onset  Hypertension Mother    Diabetes Mother    Other Father     SOCHx:   reports that she quit smoking about 24 years ago. She has never used smokeless tobacco. She reports that she does not drink alcohol or use drugs.  ALLERGIES:  Allergies  Allergen Reactions   Cholesterol     Pain and aching in joints   Hydrocodone     REACTION: nausea   Statins     REACTION: joints   Metformin And Related     unknown    MEDS:  Current Meds    Medication Sig   Abatacept (ORENCIA) 125 MG/ML SOSY Inject 75 mg into the skin every 30 (thirty) days.   aspirin 81 MG tablet Take 1 tablet (81 mg total) by mouth daily.   B-D UF III MINI PEN NEEDLES 31G X 5 MM MISC USE WITH INSULIN TO INJECT BID UTD   Cholecalciferol (VITAMIN D-3) 1000 units CAPS Take 1,000 Units by mouth daily.    folic acid (FOLVITE) 1 MG tablet Take 1 mg by mouth every evening.    furosemide (LASIX) 40 MG tablet Take 40 mg by mouth daily as needed (fluid retention.).   insulin lispro (HUMALOG) 100 UNIT/ML injection Inject 3-5 Units into the skin See admin instructions. 5 units in the morning before breakfast & 3 units in the evening before supper   losartan (COZAAR) 50 MG tablet Take 50 mg by mouth daily.   methotrexate 250 MG/10ML injection once a week.   Multiple Vitamin (MULTIVITAMIN WITH MINERALS) TABS tablet Take 1 tablet by mouth daily. Centrum silver   nitroGLYCERIN (NITROSTAT) 0.4 MG SL tablet Place 1 tablet (0.4 mg total) under the tongue every 5 (five) minutes as needed for chest pain.   ONETOUCH VERIO test strip USE 1 STRIP TID UTD   pantoprazole (PROTONIX) 40 MG tablet Take 40 mg by mouth daily.   [DISCONTINUED] HUMALOG MIX 75/25 KWIKPEN (75-25) 100 UNIT/ML Kwikpen INJECT 5 UNITS BEFORE MEALS BID   [DISCONTINUED] methotrexate (50 MG/ML) 1 g injection Inject into the vein once a week.    [DISCONTINUED] methotrexate (RHEUMATREX) 2.5 MG tablet Take 15 mg by mouth once a week. 6 tabs     ROS: Pertinent items noted in HPI and remainder of comprehensive ROS otherwise negative.  Labs/Other Tests and Data Reviewed:    Recent Labs: 01/04/2019: BUN 11; Creatinine, Ser 0.86; Hemoglobin 11.7; Platelets 247; Potassium 4.1; Sodium 140 06/02/2019: ALT 12   Recent Lipid Panel Lab Results  Component Value Date/Time   CHOL 159 06/02/2019 09:23 AM   TRIG 36 06/02/2019 09:23 AM   HDL 68 06/02/2019 09:23 AM   CHOLHDL 2.3 06/02/2019 09:23 AM   LDLCALC 84  06/02/2019 09:23 AM    Wt Readings from Last 3 Encounters:  07/25/19 150 lb (68 kg)  06/07/19 150 lb 12.8 oz (68.4 kg)  03/13/19 145 lb (65.8 kg)     Exam:    Vital Signs:  BP (!) 145/72    Temp (!) 96.4 F (35.8 C)    Ht 5\' 2"  (1.575 m)    Wt 150 lb (68 kg)    BMI 27.44 kg/m    Exam not performed due to telephone visit  ASSESSMENT & PLAN:    1. Mixed dyslipidemia, goal LDL less than 70 2. Statin intolerance 3. Coronary artery disease with moderate to severe stenoses 4. Type 2 diabetes-on insulin  5. Bilateral carotid artery disease 6. Rheumatoid arthritis  Ms. find has a mixed  dyslipidemia with goal LDL less than 70.  Her most recent LDL was 85 a month ago.  She is not on statin therapy and cannot tolerate ezetimibe due to worsening of her RA symptoms.  Her options are few and I think would be best served by PCSK9 inhibitors.  These are obviously injectable medications which she has considerable experience with.  We discussed administering them in the outer thigh as she use her abdomen for injections of her methotrexate as well as her insulin.  We will attempt to get prior authorization for the medication and advised her on patient assistance programs to help with the cost.  Most likely plan to repeat a lipid profile in 3 to 4 months with follow-up at that time.  Thanks again for the kind referral.  COVID-19 Education: The signs and symptoms of COVID-19 were discussed with the patient and how to seek care for testing (follow up with PCP or arrange E-visit).  The importance of social distancing was discussed today.  Patient Risk:   After full review of this patients clinical status, I feel that they are at least moderate risk at this time.  Time:   Today, I have spent 25 minutes with the patient with telehealth technology discussing dyslipidemia, statin intolerance, coronary artery disease, rheumatoid arthritis, carotid artery disease.     Medication Adjustments/Labs and Tests  Ordered: Current medicines are reviewed at length with the patient today.  Concerns regarding medicines are outlined above.   Tests Ordered: Orders Placed This Encounter  Procedures   Lipid panel    Medication Changes: No orders of the defined types were placed in this encounter.   Disposition:  in 3 month(s)  Chrystie NoseKenneth C. Ellison Leisure, MD, [facility]ber[facility]wn Mental Health SystemFACC, FACP  [place]   [facility]  Medical Director of the Advanced Lipid Disorders &  Cardiovascular Risk Reduction Clinic Diplomate of the American Board of Clinical Lipidology Attending Cardiologist  Direct Dial: [phone]   Fax: [phone]  Website:  [email or web address]  Chrystie NoseKenneth C Raeven Pint, MD  07/25/2019 11:19 AM

## 2019-07-25 NOTE — Patient Instructions (Signed)
Medication Instructions:  Dr. Debara Pickett recommends Repatha 140mg /mL (PCSK9). This is an injectable cholesterol medication administered once every 14 days. This medication will need prior approval with your insurance company, which we will work on. If the medication is not approved initially, we may need to do an appeal with your insurance. We will keep you updated on this process. This medication can be provided at some local pharmacies or be shipped to you from a specialty pharmacy.  -- store medication in refrigerator until 30 minutes - 1 hour prior to administration  -- rotate site with each injection - abdomen, outer thigh, back of upper arm  -- dispose of pen needle in sharps container  Patient assistance -- Dance movement psychotherapist Net OR  [email or web address] >> disease funds >> hypercholesterolemia   If you need a refill on your cardiac medications before your next appointment, please call your pharmacy.   Lab work: FASTING lab work about 1 week prior to next lipid clinic visit to check cholesterol   If you have labs (blood work) drawn today and your tests are completely normal, you will receive your results only by: [name] MyChart Message (if you have MyChart) OR . A paper copy in the mail If you have any lab test that is abnormal or we need to change your treatment, we will call you to review the results.  Testing/Procedures: NONE  Follow-Up: Lipid Clinic follow up with Dr. Debara Pickett on Monday December 14 @ 10am

## 2019-07-26 ENCOUNTER — Telehealth: Payer: Self-pay | Admitting: Internal Medicine

## 2019-07-26 MED ORDER — REPATHA SURECLICK 140 MG/ML ~~LOC~~ SOAJ: 1.0000 | SUBCUTANEOUS | 11 refills | Status: DC

## 2019-07-26 NOTE — Telephone Encounter (Signed)
Request Reference Number: [id number]. REPATHA SURE INJ 140MG /ML is approved through 01/23/2020

## 2019-07-26 NOTE — Telephone Encounter (Signed)
Patient aware medication has been approved. Rx(s) sent to pharmacy electronically. She will check with pharmacy on co-pay. If not affordable, will look into the [email or web address] application (disease funds >> hypercholesterolemia). If she does not qualify, she was advised to call back - can check out [place] [facility] manager.

## 2019-07-26 NOTE — Addendum Note (Signed)
Addended by: Fidel Levy on: 07/26/2019 11:43 AM   Modules accepted: Orders

## 2019-07-26 NOTE — Telephone Encounter (Signed)
PA for repatha sureclick submitted via covermymeds.com  (Key: [id number])

## 2019-10-30 ENCOUNTER — Telehealth: Payer: Medicare Other | Admitting: Internal Medicine

## 2019-11-30 DIAGNOSIS — M0589 Other rheumatoid arthritis with rheumatoid factor of multiple sites: Secondary | ICD-10-CM | POA: Diagnosis not present

## 2019-12-11 DIAGNOSIS — I2510 Atherosclerotic heart disease of native coronary artery without angina pectoris: Secondary | ICD-10-CM | POA: Diagnosis not present

## 2019-12-11 DIAGNOSIS — E785 Hyperlipidemia, unspecified: Secondary | ICD-10-CM | POA: Diagnosis not present

## 2019-12-11 DIAGNOSIS — E1122 Type 2 diabetes mellitus with diabetic chronic kidney disease: Secondary | ICD-10-CM | POA: Diagnosis not present

## 2019-12-11 DIAGNOSIS — N182 Chronic kidney disease, stage 2 (mild): Secondary | ICD-10-CM | POA: Diagnosis not present

## 2019-12-11 DIAGNOSIS — I129 Hypertensive chronic kidney disease with stage 1 through stage 4 chronic kidney disease, or unspecified chronic kidney disease: Secondary | ICD-10-CM | POA: Diagnosis not present

## 2019-12-28 ENCOUNTER — Telehealth: Payer: Self-pay | Admitting: Internal Medicine

## 2019-12-28 NOTE — Telephone Encounter (Signed)
Patient's PA for repatha expires 01/23/20 Need to know if patient has been taking this medication if so, she will need fasting lipid panel done before end of Feb so that new PA can be completed  LM for patent to call back to address this

## 2020-01-03 NOTE — Telephone Encounter (Signed)
Spoke with patient who reports she told us that she called our office to notify us that she cannot give herself an injection, she "panicked". She reports she already given herself an insulin injection and methotrexate. Explained that we do not have a notice on file where she called in about this.   Will notify MD

## 2020-01-08 DIAGNOSIS — M0589 Other rheumatoid arthritis with rheumatoid factor of multiple sites: Secondary | ICD-10-CM | POA: Diagnosis not present

## 2020-01-15 DIAGNOSIS — L308 Other specified dermatitis: Secondary | ICD-10-CM | POA: Diagnosis not present

## 2020-01-15 DIAGNOSIS — L309 Dermatitis, unspecified: Secondary | ICD-10-CM | POA: Diagnosis not present

## 2020-02-07 ENCOUNTER — Other Ambulatory Visit: Payer: Self-pay | Admitting: Physician Assistant

## 2020-02-12 DIAGNOSIS — M0589 Other rheumatoid arthritis with rheumatoid factor of multiple sites: Secondary | ICD-10-CM | POA: Diagnosis not present

## 2020-02-27 DIAGNOSIS — E785 Hyperlipidemia, unspecified: Secondary | ICD-10-CM | POA: Diagnosis not present

## 2020-02-27 DIAGNOSIS — M170 Bilateral primary osteoarthritis of knee: Secondary | ICD-10-CM | POA: Diagnosis not present

## 2020-02-27 DIAGNOSIS — E119 Type 2 diabetes mellitus without complications: Secondary | ICD-10-CM | POA: Diagnosis not present

## 2020-02-27 DIAGNOSIS — I2510 Atherosclerotic heart disease of native coronary artery without angina pectoris: Secondary | ICD-10-CM | POA: Diagnosis not present

## 2020-02-27 DIAGNOSIS — M0589 Other rheumatoid arthritis with rheumatoid factor of multiple sites: Secondary | ICD-10-CM | POA: Diagnosis not present

## 2020-02-27 DIAGNOSIS — I10 Essential (primary) hypertension: Secondary | ICD-10-CM | POA: Diagnosis not present

## 2020-02-27 DIAGNOSIS — Z79899 Other long term (current) drug therapy: Secondary | ICD-10-CM | POA: Diagnosis not present

## 2020-02-27 DIAGNOSIS — M810 Age-related osteoporosis without current pathological fracture: Secondary | ICD-10-CM | POA: Diagnosis not present

## 2020-03-12 DIAGNOSIS — M0589 Other rheumatoid arthritis with rheumatoid factor of multiple sites: Secondary | ICD-10-CM | POA: Diagnosis not present

## 2020-03-20 ENCOUNTER — Other Ambulatory Visit: Payer: Self-pay | Admitting: Physician Assistant

## 2020-03-26 ENCOUNTER — Encounter: Payer: Self-pay | Admitting: Physician Assistant

## 2020-03-26 ENCOUNTER — Telehealth: Payer: Self-pay

## 2020-03-26 ENCOUNTER — Other Ambulatory Visit: Payer: Self-pay

## 2020-03-26 ENCOUNTER — Telehealth (INDEPENDENT_AMBULATORY_CARE_PROVIDER_SITE_OTHER): Payer: Medicare PPO | Admitting: Physician Assistant

## 2020-03-26 VITALS — BP 107/55 | HR 72 | Ht 62.0 in | Wt 155.0 lb

## 2020-03-26 DIAGNOSIS — I25119 Atherosclerotic heart disease of native coronary artery with unspecified angina pectoris: Secondary | ICD-10-CM

## 2020-03-26 DIAGNOSIS — E782 Mixed hyperlipidemia: Secondary | ICD-10-CM

## 2020-03-26 DIAGNOSIS — F419 Anxiety disorder, unspecified: Secondary | ICD-10-CM

## 2020-03-26 MED ORDER — ISOSORBIDE MONONITRATE ER 30 MG PO TB24: 45.0000 mg | ORAL_TABLET | Freq: Every day | ORAL | 1 refills | Status: DC

## 2020-03-26 MED ORDER — LOSARTAN POTASSIUM 25 MG PO TABS: 25.0000 mg | ORAL_TABLET | Freq: Every day | ORAL | 1 refills | Status: DC

## 2020-03-26 NOTE — Patient Instructions (Signed)
Medication Instructions:   Your physician has recommended you make the following change in your medication:   1) Decrease Losartan to 25 mg, 1 tablet by mouth once a day 2) Increase Isosorbide to 45 mg, 1.5 tablets by mouth once a day  *If you need a refill on your cardiac medications before your next appointment, please call your pharmacy*  Lab Work:  None ordered today  Testing/Procedures:  None ordered today  Follow-Up: At [facility], you and your health needs are our priority.  As part of our continuing mission to provide you with exceptional heart care, we have created designated Provider Care Teams.  These Care Teams include your primary Cardiologist (physician) and Advanced Practice Providers (APPs -  Physician Assistants and Nurse Practitioners) who all work together to provide you with the care you need, when you need it.  We recommend signing up for the patient portal called "MyChart".  Sign up information is provided on this After Visit Summary.  MyChart is used to connect with patients for Virtual Visits (Telemedicine).  Patients are able to view lab/test results, encounter notes, upcoming appointments, etc.  Non-urgent messages can be sent to your provider as well.   To learn more about what you can do with MyChart, go to [email or web address].    Your next appointment:    On  Other Instructions  Follow up with PCP about anxiety

## 2020-03-26 NOTE — Telephone Encounter (Signed)
  Patient Consent for Virtual Visit         [name] [name] has provided verbal consent on 03/26/2020 for a virtual visit (video or telephone).   CONSENT FOR VIRTUAL VISIT FOR:  [name] [name] [name] [name]  By participating in this virtual visit I agree to the following:  I hereby voluntarily request, consent and authorize [facility] and its employed or contracted physicians, physician assistants, nurse practitioners or other licensed health care professionals (the Practitioner), to provide me with telemedicine health care services (the "Services") as deemed necessary by the treating Practitioner. I acknowledge and consent to receive the Services by the Practitioner via telemedicine. I understand that the telemedicine visit will involve communicating with the Practitioner through live audiovisual communication technology and the disclosure of certain medical information by electronic transmission. I acknowledge that I have been given the opportunity to request an in-person assessment or other available alternative prior to the telemedicine visit and am voluntarily participating in the telemedicine visit.  I understand that I have the right to withhold or withdraw my consent to the use of telemedicine in the course of my care at any time, without affecting my right to future care or treatment, and that the Practitioner or I may terminate the telemedicine visit at any time. I understand that I have the right to inspect all information obtained and/or recorded in the course of the telemedicine visit and may receive copies of available information for a reasonable fee.  I understand that some of the potential risks of receiving the Services via telemedicine include:  [name] Delay or interruption in medical evaluation due to technological equipment failure or disruption; . Information transmitted may not be sufficient (e.g. poor resolution of images) to allow for appropriate medical decision making by the Practitioner;  and/or  . In rare instances, security protocols could fail, causing a breach of personal health information.  Furthermore, I acknowledge that it is my responsibility to provide information about my medical history, conditions and care that is complete and accurate to the best of my ability. I acknowledge that Practitioner's advice, recommendations, and/or decision may be based on factors not within their control, such as incomplete or inaccurate data provided by me or distortions of diagnostic images or specimens that may result from electronic transmissions. I understand that the practice of medicine is not an exact science and that Practitioner makes no warranties or guarantees regarding treatment outcomes. I acknowledge that a copy of this consent can be made available to me via my patient portal [facility] MyChart), or I can request a printed copy by calling the office of [facility].    I understand that my insurance will be billed for this visit.   I have read or had this consent read to me. . I understand the contents of this consent, which adequately explains the benefits and risks of the Services being provided via telemedicine.  . I have been provided ample opportunity to ask questions regarding this consent and the Services and have had my questions answered to my satisfaction. . I give my informed consent for the services to be provided through the use of telemedicine in my medical care

## 2020-03-26 NOTE — Progress Notes (Signed)
Virtual Visit via Telephone Note   This visit type was conducted due to national recommendations for restrictions regarding the COVID-19 Pandemic (e.g. social distancing) in an effort to limit this patient's exposure and mitigate transmission in our community.  Due to her co-morbid illnesses, this patient is at least at moderate risk for complications without adequate follow up.  This format is felt to be most appropriate for this patient at this time.  The patient did not have access to video technology/had technical difficulties with video requiring transitioning to audio format only (telephone).  All issues noted in this document were discussed and addressed.  No physical exam could be performed with this format.  Please refer to the patient's chart for her  consent to telehealth for [facility].   The patient was identified using 2 identifiers.  Date:  03/26/2020   ID:  [name] [name], DOB 04/15/1941, MRN [id number]  Patient Location: Home Provider Location: Office  PCP:  Irena Reichmann, DO  Cardiologist:  Kristeen Miss, MD   Electrophysiologist:  None   Evaluation Performed:  Follow-Up Visit  Chief Complaint:  CAD  Patient Profile: [name] [name] is a 79 y.o. female with:  Coronary artery disease  ? S/p MI 06/1996 tx with POBA to LCx ? Cath 12/2018: dLAD 80, pRCA 85 (small) >> med Rx; PCI of dLAD only if refractory angina ? Intol of Toprol (arthralgias) >> changed to Imdur  Diabetes mellitus  Rheumatoid arthritis   Carotid artery Dz  Hyperlipidemia ? Statin intolerant; intol of Zetia >> referred to Lipid Clinic   Prior CV Studies:  Cardiac CatheterizationMarch 18, 2020 LM irregs LAD prox 30, dist 80 LCx ost 40 RCA (small) prox 85 EF 55-65  GXT 07/08/2016 Patient only achieved 86% of PMHR 1 mm Horizontal ST segment depression in lateral leads peak stress and recovery  Suggest f/u perfusion imaging  Echo 05/08/2015 EF 55-60, normal wall motion, grade 1  diastolic dysfunction  Myoview 06/25/1750 EF 75, no ischemia, normal study  Carotid US 09/23/2010 Bilateral ICA 0-39  Cardiac catheterization 12/26/02 LM distal 20 LAD proximal-mid 30, distal 60 LCx ostial 80, mid to distal 30 RCA proximal 75/80, distal 60 EF 65-70 LCx not optimal for PCI, RCA small >>medical therapy   History of Present Illness:   [name] was last seen in clinic in July 2020.  She has chronic angina.  This was well controlled on long-acting nitrate therapy.  Since that visit, she has seen Dr. Rennis Golden with the lipid clinic.  She has been started on evolocumab.  Today, she notes a lot of issues with anxiety.  Her son died 4 years ago.  She feels it is getting worse.  She takes NTG with relief.  She notes some shortness of breath when outside.  She thinks this may be from pollen.  She has a lot of drainage and dry cough.  She has not had any chest pain.  She does not have any of the anxiety feeling when she is exerting herself.  She has not had orthopnea.  She has mild pedal edema that is controlled with compression stockings.  She has not had syncope.    Past Medical History:  Diagnosis Date  . CAD (coronary artery disease)    Catheterization 2004, 60% distal LAD, 80% ostial circumflex( not optimal for PCI), 70% small RCA, medical therapy  /  nuclear June, 2005, EF 65%, no ischemia  . Carotid artery disease (HCC)    Doppler, November, 2011, no significant  plaque, distal R. ICA velocities are elevated and could be source of bruit, 0-39% bilateral  . Diabetes mellitus   . Drug therapy    Intermittent steroid use  . Dyslipidemia   . Edema   . Ejection fraction    EF 60%, echo, November, 2011, trivial pericardial effusion  . Rheumatoid arthritis(714.0)    Hospitalization August, 2011, severe RA flare,   . Statin intolerance    Past Surgical History:  Procedure Laterality Date  . ABDOMINAL HYSTERECTOMY    . CHOLECYSTECTOMY    . LEFT HEART CATH AND CORONARY  ANGIOGRAPHY N/A 01/10/2019   Procedure: LEFT HEART CATH AND CORONARY ANGIOGRAPHY;  Surgeon: Martinique, Peter M, MD;  Location: [place] [facility];  Service: Cardiovascular;  Laterality: N/A;     Current Meds  Medication Sig  . Abatacept (ORENCIA) 125 MG/ML SOSY Inject 75 mg into the skin every 30 (thirty) days.  [name] aspirin 81 MG tablet Take 1 tablet (81 mg total) by mouth daily.  . B-D UF III MINI PEN NEEDLES 31G X 5 MM MISC USE WITH INSULIN TO INJECT BID UTD  . Cholecalciferol (VITAMIN D-3) 1000 units CAPS Take 1,000 Units by mouth daily.   . folic acid (FOLVITE) 1 MG tablet Take 1 mg by mouth every evening.   . furosemide (LASIX) 40 MG tablet Take 40 mg by mouth daily as needed (fluid retention.).  [name] insulin lispro (HUMALOG) 100 UNIT/ML injection Inject 3-5 Units into the skin See admin instructions. 5 units in the morning before breakfast & 3 units in the evening before supper  . methotrexate 250 MG/10ML injection once a week.  . Multiple Vitamin (MULTIVITAMIN WITH MINERALS) TABS tablet Take 1 tablet by mouth daily. Centrum silver  . nitroGLYCERIN (NITROSTAT) 0.4 MG SL tablet Place 0.4 mg under the tongue every 5 (five) minutes as needed for chest pain.  Glory Rosebush VERIO test strip USE 1 STRIP TID UTD  . pantoprazole (PROTONIX) 40 MG tablet Take 40 mg by mouth daily.  . [DISCONTINUED] isosorbide mononitrate (IMDUR) 30 MG 24 hr tablet Take 30 mg by mouth daily.  . [DISCONTINUED] losartan (COZAAR) 50 MG tablet Take 50 mg by mouth daily.     Allergies:   Cholesterol, Hydrocodone, Statins, and Metformin and related   Social History   Tobacco Use  . Smoking status: Former Smoker    Quit date: 11/16/1994    Years since quitting: 25.3  . Smokeless tobacco: Never Used  Substance Use Topics  . Alcohol use: No    Alcohol/week: 0.0 standard drinks  . Drug use: No     Family Hx: The patient's family history includes Diabetes in her mother; Hypertension in her mother; Other in her  father.  ROS:   Please see the history of present illness.    No fever, cough, vomiting, melena, hematuria.   Labs/Other Tests and Data Reviewed:    EKG:  No ECG reviewed.  Recent Labs: 06/02/2019: ALT 12   Recent Lipid Panel Lab Results  Component Value Date/Time   CHOL 159 06/02/2019 09:23 AM   TRIG 36 06/02/2019 09:23 AM   HDL 68 06/02/2019 09:23 AM   CHOLHDL 2.3 06/02/2019 09:23 AM   LDLCALC 84 06/02/2019 09:23 AM    Wt Readings from Last 3 Encounters:  03/26/20 155 lb (70.3 kg)  07/25/19 150 lb (68 kg)  06/07/19 150 lb 12.8 oz (68.4 kg)     Objective:    Vital Signs:  BP (!) 107/55   Pulse  72   Ht 5\' 2"  (1.575 m)   Wt 155 lb (70.3 kg)   BMI 28.35 kg/m    VITAL SIGNS:  reviewed GEN:  no acute distress RESPIRATORY:  Normal respiratory effort NEURO:  Alert and oriented PSYCH:  normal affect  ASSESSMENT & PLAN:    1. Coronary artery disease involving native coronary artery of native heart with angina pectoris (HCC) History of remote myocardial infarction in 1997 treated with angioplasty to the left circumflex.Cardiac catheterization in Feb 2020 demonstrated severe distal LAD stenosis and segmental 85% stenosis in the proximal RCA which is a small caliber vessel. The RCA is not amenable to PCI. Medical therapy has been recommended unless she has refractory angina. In that case, PCI of the LAD could be considered.   Currently, she is describing symptoms of anxiety.  She takes nitroglycerin for this with relief.  However, she has not specifically describing chest discomfort.  She has some shortness of breath that seems to be related to allergies.  I have suggested increasing her isosorbide to 45 mg daily.  I will decrease her losartan to 25 mg daily to prevent hypotension.  Continue current dose of aspirin.  She is intolerant of statins.  Follow-up in person in 3 months.  2. Mixed hyperlipidemia She is intolerant of statins.  She was not able to give herself an  injection in order to take PCSK9 inhibitors.  I will discuss with Dr. Mar 2020 to see if she could be started on bempedoic acid.  3. Anxiety I suspect most of her symptoms are related to anxiety.  She does note lots of thoughts about her son who passed away 4 years ago.  I will try to adjust her antianginals just in case her symptoms could be related to angina.  However, I have also recommended that she follow-up with primary care as I believe her symptoms are more likely related to depression and anxiety.    Time:   Today, I have spent 18 minutes with the patient with telehealth technology discussing the above problems.     Medication Adjustments/Labs and Tests Ordered: Current medicines are reviewed at length with the patient today.  Concerns regarding medicines are outlined above.   Tests Ordered: No orders of the defined types were placed in this encounter.   Medication Changes: Meds ordered this encounter  Medications  . losartan (COZAAR) 25 MG tablet    Sig: Take 1 tablet (25 mg total) by mouth daily.    Dispense:  90 tablet    Refill:  1  . isosorbide mononitrate (IMDUR) 30 MG 24 hr tablet    Sig: Take 1.5 tablets (45 mg total) by mouth daily.    Dispense:  135 tablet    Refill:  1    Follow Up:  In Person in 3 month(s)  Signed, Rennis Golden, PA-C  03/26/2020 5:21 PM    [facility]

## 2020-03-28 ENCOUNTER — Telehealth: Payer: Self-pay | Admitting: Internal Medicine

## 2020-03-28 NOTE — Telephone Encounter (Signed)
-----   Message from Chrystie Nose, MD sent at 03/27/2020  7:20 PM EDT ----- Regarding: PA for Nexletol 180 mg daily Yes .. we can take care of it! ----- Message ----- From: Kennon Rounds Sent: 03/27/2020   5:20 PM EDT To: Chrystie Nose, MD  [facility].  Thanks! Will your nurse handle the PA? Scott ----- Message ----- From: Chrystie Nose, MD Sent: 03/27/2020   9:24 AM EDT To: Beatrice Lecher, PA-C  Yes . Could try bempedoic acid.  -[place] ----- Message ----- From: Kennon Rounds Sent: 03/27/2020   8:04 AM EDT To: Chrystie Nose, MD  [place] Do you think we could try for bempedoic acid with her? Thanks [employer]

## 2020-03-28 NOTE — Telephone Encounter (Signed)
LM for patient to call back concerning Nexletol. Patient will need updated lipid panel per Dr. Rennis Golden

## 2020-04-09 DIAGNOSIS — M0589 Other rheumatoid arthritis with rheumatoid factor of multiple sites: Secondary | ICD-10-CM | POA: Diagnosis not present

## 2020-04-17 NOTE — Telephone Encounter (Signed)
Spoke with patient about lipid panel. She has ROV with PCP on June 14 or 17 and states she should be getting cholesterol checked then. Asked that PCP send results to Dr. Rennis Golden. Will check on labs after these dates in order to do PA for [facility]

## 2020-05-02 DIAGNOSIS — E785 Hyperlipidemia, unspecified: Secondary | ICD-10-CM | POA: Diagnosis not present

## 2020-05-02 DIAGNOSIS — F419 Anxiety disorder, unspecified: Secondary | ICD-10-CM | POA: Diagnosis not present

## 2020-05-07 DIAGNOSIS — M0589 Other rheumatoid arthritis with rheumatoid factor of multiple sites: Secondary | ICD-10-CM | POA: Diagnosis not present

## 2020-05-27 NOTE — Telephone Encounter (Signed)
LMTCB to see if patient had labs done with PCP - unable to locate recent lipid panel in [facility] or care everywhere (not available)

## 2020-06-06 DIAGNOSIS — M0589 Other rheumatoid arthritis with rheumatoid factor of multiple sites: Secondary | ICD-10-CM | POA: Diagnosis not present

## 2020-06-10 DIAGNOSIS — E1122 Type 2 diabetes mellitus with diabetic chronic kidney disease: Secondary | ICD-10-CM | POA: Diagnosis not present

## 2020-06-10 DIAGNOSIS — E785 Hyperlipidemia, unspecified: Secondary | ICD-10-CM | POA: Diagnosis not present

## 2020-06-10 DIAGNOSIS — I129 Hypertensive chronic kidney disease with stage 1 through stage 4 chronic kidney disease, or unspecified chronic kidney disease: Secondary | ICD-10-CM | POA: Diagnosis not present

## 2020-06-10 DIAGNOSIS — I2510 Atherosclerotic heart disease of native coronary artery without angina pectoris: Secondary | ICD-10-CM | POA: Diagnosis not present

## 2020-06-10 DIAGNOSIS — E1165 Type 2 diabetes mellitus with hyperglycemia: Secondary | ICD-10-CM | POA: Diagnosis not present

## 2020-06-18 DIAGNOSIS — M0589 Other rheumatoid arthritis with rheumatoid factor of multiple sites: Secondary | ICD-10-CM | POA: Diagnosis not present

## 2020-06-18 DIAGNOSIS — M170 Bilateral primary osteoarthritis of knee: Secondary | ICD-10-CM | POA: Diagnosis not present

## 2020-06-18 DIAGNOSIS — E785 Hyperlipidemia, unspecified: Secondary | ICD-10-CM | POA: Diagnosis not present

## 2020-06-18 DIAGNOSIS — I2510 Atherosclerotic heart disease of native coronary artery without angina pectoris: Secondary | ICD-10-CM | POA: Diagnosis not present

## 2020-06-18 DIAGNOSIS — E119 Type 2 diabetes mellitus without complications: Secondary | ICD-10-CM | POA: Diagnosis not present

## 2020-06-18 DIAGNOSIS — M810 Age-related osteoporosis without current pathological fracture: Secondary | ICD-10-CM | POA: Diagnosis not present

## 2020-06-18 DIAGNOSIS — I10 Essential (primary) hypertension: Secondary | ICD-10-CM | POA: Diagnosis not present

## 2020-06-18 DIAGNOSIS — Z79899 Other long term (current) drug therapy: Secondary | ICD-10-CM | POA: Diagnosis not present

## 2020-06-20 ENCOUNTER — Other Ambulatory Visit: Payer: Self-pay | Admitting: Cardiovascular Disease

## 2020-06-25 NOTE — Progress Notes (Signed)
Cardiology Office Note:    Date:  06/26/2020   ID:  [name] [name], DOB Aug 26, 1941, MRN [id number]  PCP:  Irena Reichmann, DO  Cardiologist:  Kristeen Miss, MD   Electrophysiologist:  None   Referring MD: Irena Reichmann, DO   Chief Complaint:  Follow-up (CAD)    Patient Profile:    [name] [name] is a 79 y.o. female with:   Coronary artery disease  ? S/p MI 06/1996 tx with POBA to LCx ? Cath 12/2018: dLAD 80, pRCA 85 (small) >> med Rx; PCI of dLAD only if refractory angina ? Intol of Toprol (arthralgias) >> changed to Imdur  Diabetes mellitus  Rheumatoid arthritis   Carotid artery Dz  [place] 2011: 0-39% bilat ICA   Hyperlipidemia  Statin intolerant; intol of Zetia   Intol of Evolocumab (needle phobia)  Bempedoic acid   Prior CV Studies:  Cardiac Catheterization2020-03-07 LM irregs LAD prox 30, dist 80 LCx ost 40 RCA (small) prox 85 EF 55-65  GXT 07/08/2016 Patient only achieved 86% of PMHR 1 mm Horizontal ST segment depression in lateral leads peak stress and recovery  Suggest f/u perfusion imaging  Echo 05/08/2015 EF 55-60, normal wall motion, grade 1 diastolic dysfunction  Myoview 05/18/5008 EF 75, no ischemia, normal study  Carotid US 09/23/2010 Bilateral ICA 0-39  Cardiac catheterization 12/26/02 LM distal 20 LAD proximal-mid 30, distal 60 LCx ostial 80, mid to distal 30 RCA proximal 75/80, distal 60 EF 65-70 LCx not optimal for PCI, RCA small >>medical therapy   History of Present Illness:    [name] was last seen in 03/2020 via Telemedicine.  She returns for follow up.   She is here alone.  She has been doing well without chest discomfort.  She does get short of breath with some activities but this is overall stable.  She has not had orthopnea, lower extremity swelling or syncope.  Past Medical History:  Diagnosis Date  . CAD (coronary artery disease)    Catheterization 2004, 60% distal LAD, 80% ostial circumflex( not optimal for PCI),  70% small RCA, medical therapy  /  nuclear June, 2005, EF 65%, no ischemia  . Carotid artery disease (HCC)    Doppler, November, 2011, no significant plaque, distal R. ICA velocities are elevated and could be source of bruit, 0-39% bilateral  . Diabetes mellitus   . Drug therapy    Intermittent steroid use  . Dyslipidemia   . Edema   . Ejection fraction    EF 60%, echo, November, 2011, trivial pericardial effusion  . Rheumatoid arthritis(714.0)    Hospitalization August, 2011, severe RA flare,   . Statin intolerance     Current Medications: Current Meds  Medication Sig  . Abatacept (ORENCIA) 125 MG/ML SOSY Inject 75 mg into the skin every 30 (thirty) days.  [name] aspirin 81 MG tablet Take 1 tablet (81 mg total) by mouth daily.  . B-D UF III MINI PEN NEEDLES 31G X 5 MM MISC USE WITH INSULIN TO INJECT BID UTD  . Cholecalciferol (VITAMIN D-3) 1000 units CAPS Take 1,000 Units by mouth daily.   . folic acid (FOLVITE) 1 MG tablet Take 1 mg by mouth every evening.   . furosemide (LASIX) 40 MG tablet Take 40 mg by mouth daily as needed (fluid retention.).  [name] insulin lispro (HUMALOG) 100 UNIT/ML injection Inject 3-5 Units into the skin See admin instructions. 5 units in the morning before breakfast & 3 units in the evening before supper  . isosorbide  mononitrate (IMDUR) 30 MG 24 hr tablet Take 1.5 tablets (45 mg total) by mouth daily.  [name] losartan (COZAAR) 25 MG tablet Take 1 tablet (25 mg total) by mouth daily.  . methotrexate 250 MG/10ML injection Inject 30 mg/m2 into the muscle once a week.   . Multiple Vitamin (MULTIVITAMIN WITH MINERALS) TABS tablet Take 1 tablet by mouth daily. Centrum silver  . nitroGLYCERIN (NITROSTAT) 0.4 MG SL tablet Place 1 tablet (0.4 mg total) under the tongue every 5 (five) minutes as needed for chest pain.  Letta Pate VERIO test strip USE 1 STRIP TID UTD  . pantoprazole (PROTONIX) 40 MG tablet Take 40 mg by mouth daily.  . [DISCONTINUED] nitroGLYCERIN (NITROSTAT) 0.4  MG SL tablet Place 0.4 mg under the tongue every 5 (five) minutes as needed for chest pain.     Allergies:   Cholesterol, Hydrocodone, Statins, and Metformin and related   Social History   Tobacco Use  . Smoking status: Former Smoker    Quit date: 11/16/1994    Years since quitting: 25.6  . Smokeless tobacco: Never Used  Vaping Use  . Vaping Use: Never used  Substance Use Topics  . Alcohol use: No    Alcohol/week: 0.0 standard drinks  . Drug use: No     Family Hx: The patient's family history includes Diabetes in her mother; Hypertension in her mother; Other in her father.  Review of Systems  Gastrointestinal: Negative for hematochezia.  Genitourinary: Negative for hematuria.     EKGs/Labs/Other Test Reviewed:    EKG:  EKG is   ordered today.  The ekg ordered today demonstrates normal sinus rhythm, heart rate 60, normal axis, no ST-T wave changes, QTC 421, no change from prior tracing  Recent Labs: No results found for requested labs within last 8760 hours.   Recent Lipid Panel Lab Results  Component Value Date/Time   CHOL 159 06/02/2019 09:23 AM   TRIG 36 06/02/2019 09:23 AM   HDL 68 06/02/2019 09:23 AM   CHOLHDL 2.3 06/02/2019 09:23 AM   LDLCALC 84 06/02/2019 09:23 AM   Labs from PCP personally reviewed and interpreted ([facility]) 05/02/20: HDL 78, LDL 91, Trig 32 06/10/20: Az1 6.4 09/26/2019: SCr 0.98   Physical Exam:    VS:  BP 124/60   Pulse 68   Ht 5\' 2"  (1.575 m)   Wt 154 lb (69.9 kg)   SpO2 98%   BMI 28.17 kg/m     Wt Readings from Last 3 Encounters:  06/26/20 154 lb (69.9 kg)  03/26/20 155 lb (70.3 kg)  07/25/19 150 lb (68 kg)     Constitutional:      Appearance: Healthy appearance. Not in distress.  Neck:     Vascular: No carotid bruit. JVD normal.  Pulmonary:     Effort: Pulmonary effort is normal.     Breath sounds: No wheezing. No rales.  Cardiovascular:     Normal rate. Regular rhythm. Normal S1. Normal S2.     Murmurs: There is no  murmur.  Edema:    Peripheral edema absent.  Abdominal:     Palpations: Abdomen is soft.  Skin:    General: Skin is warm and dry.  Neurological:     Mental Status: Alert and oriented to person, place and time.     Cranial Nerves: Cranial nerves are intact.       ASSESSMENT & PLAN:    1. Coronary artery disease involving native coronary artery of native heart with angina pectoris (HCC)  History of remote myocardial infarction in 1997 treated with angioplasty to the left circumflex.Cardiac catheterizationin Feb 2020demonstrated severe distal LAD stenosis and segmental 85% stenosis in the proximal RCA which is a small caliber vessel. The RCA is not amenable to PCI. Medical therapy has been recommended unless she has refractory angina.  She is doing well without anginal symptoms.  Continue aspirin, isosorbide.  2. Mixed hyperlipidemia She is intolerant to multiple medications.  I will touch base with our lipid clinic to see if we can get her started on bempedoic acid.  3. Type 2 diabetes mellitus without complication, with long-term current use of insulin (HCC) Well-controlled.  Recent hemoglobin A1c in July 2021 6.4.    Dispo:  Return in about 6 months (around 12/27/2020) for Routine Follow Up, w/ Dr. Elease Hashimoto, or Tereso Newcomer, PA-C, in person.   Medication Adjustments/Labs and Tests Ordered: Current medicines are reviewed at length with the patient today.  Concerns regarding medicines are outlined above.  Tests Ordered: No orders of the defined types were placed in this encounter.  Medication Changes: Meds ordered this encounter  Medications  . nitroGLYCERIN (NITROSTAT) 0.4 MG SL tablet    Sig: Place 1 tablet (0.4 mg total) under the tongue every 5 (five) minutes as needed for chest pain.    Dispense:  30 tablet    Refill:  4    Signed, Tereso Newcomer, PA-C  06/26/2020 12:11 PM    [facility] [address] [place], [place], [place]  [address] Phone: [phone]; Fax: [phone]

## 2020-06-26 ENCOUNTER — Other Ambulatory Visit: Payer: Self-pay

## 2020-06-26 ENCOUNTER — Ambulatory Visit: Payer: Medicare PPO | Admitting: Physician Assistant

## 2020-06-26 ENCOUNTER — Encounter: Payer: Self-pay | Admitting: Physician Assistant

## 2020-06-26 ENCOUNTER — Telehealth: Payer: Self-pay | Admitting: Internal Medicine

## 2020-06-26 VITALS — BP 124/60 | HR 68 | Ht 62.0 in | Wt 154.0 lb

## 2020-06-26 DIAGNOSIS — E119 Type 2 diabetes mellitus without complications: Secondary | ICD-10-CM | POA: Diagnosis not present

## 2020-06-26 DIAGNOSIS — E782 Mixed hyperlipidemia: Secondary | ICD-10-CM

## 2020-06-26 DIAGNOSIS — Z794 Long term (current) use of insulin: Secondary | ICD-10-CM

## 2020-06-26 DIAGNOSIS — I25119 Atherosclerotic heart disease of native coronary artery with unspecified angina pectoris: Secondary | ICD-10-CM | POA: Diagnosis not present

## 2020-06-26 MED ORDER — NITROGLYCERIN 0.4 MG SL SUBL: 0.4000 mg | SUBLINGUAL_TABLET | SUBLINGUAL | 4 refills | Status: DC | PRN

## 2020-06-26 MED ORDER — NEXLETOL 180 MG PO TABS: 1.0000 | ORAL_TABLET | Freq: Every day | ORAL | 11 refills | Status: DC

## 2020-06-26 NOTE — Telephone Encounter (Signed)
PA submitted to [facility] via CMM (Key: [id number]) PA Case: [id number], Status: Approved, Coverage Starts on: 11/17/2019 12:00:00 AM, Coverage Ends on: 11/15/2020 12:00:00 AM

## 2020-06-26 NOTE — Telephone Encounter (Signed)
-----   Message from Beatrice Lecher, [place] sent at 06/26/2020 12:02 PM EDT ----- Regarding: Bempedoic acid Hi Eileen Stanford  I saw [name] [name] today.  I talked to her about Bempedoic Acid.  She is willing to try it.  It looks like the last thing that was needed was a recent Lipid Panel. On her [facility] today, there were labs from 05/02/20:  Trig 32, HDL 78, LDL 91. Can you help get her started on Bempedoic Acid? Thanks! Scott

## 2020-06-26 NOTE — Telephone Encounter (Signed)
Patient aware med approved and Rx sent in. Sent her MyChart message with [employer] info and scheduled her for lipid clinic visit in Nov - will send her a lab reminder

## 2020-06-26 NOTE — Telephone Encounter (Signed)
Rx sent to pharmacy - they will generate PA request if needed

## 2020-06-26 NOTE — Patient Instructions (Signed)
Medication Instructions:   Your physician recommends that you continue on your current medications as directed. Please refer to the Current Medication list given to you today.  *If you need a refill on your cardiac medications before your next appointment, please call your pharmacy*  Lab Work:  None ordered today  Testing/Procedures:  None ordered today  Follow-Up: At [facility], you and your health needs are our priority.  As part of our continuing mission to provide you with exceptional heart care, we have created designated Provider Care Teams.  These Care Teams include your primary Cardiologist (physician) and Advanced Practice Providers (APPs -  Physician Assistants and Nurse Practitioners) who all work together to provide you with the care you need, when you need it.  We recommend signing up for the patient portal called "MyChart".  Sign up information is provided on this After Visit Summary.  MyChart is used to connect with patients for Virtual Visits (Telemedicine).  Patients are able to view lab/test results, encounter notes, upcoming appointments, etc.  Non-urgent messages can be sent to your provider as well.   To learn more about what you can do with MyChart, go to [email or web address].    Your next appointment:   6 month(s)  The format for your next appointment:   In Person  Provider:   You may see Philip Nahser, MD or Scott Weaver, PA-C 

## 2020-07-01 NOTE — Addendum Note (Signed)
Addended by: Kerrie Buffalo on: 07/01/2020 11:57 AM   Modules accepted: Orders

## 2020-07-09 DIAGNOSIS — M0589 Other rheumatoid arthritis with rheumatoid factor of multiple sites: Secondary | ICD-10-CM | POA: Diagnosis not present

## 2020-08-06 DIAGNOSIS — E1122 Type 2 diabetes mellitus with diabetic chronic kidney disease: Secondary | ICD-10-CM | POA: Diagnosis not present

## 2020-08-06 DIAGNOSIS — M0589 Other rheumatoid arthritis with rheumatoid factor of multiple sites: Secondary | ICD-10-CM | POA: Diagnosis not present

## 2020-08-09 DIAGNOSIS — N390 Urinary tract infection, site not specified: Secondary | ICD-10-CM | POA: Diagnosis not present

## 2020-08-14 DIAGNOSIS — N390 Urinary tract infection, site not specified: Secondary | ICD-10-CM | POA: Diagnosis not present

## 2020-08-14 DIAGNOSIS — I10 Essential (primary) hypertension: Secondary | ICD-10-CM | POA: Diagnosis not present

## 2020-08-14 DIAGNOSIS — Z23 Encounter for immunization: Secondary | ICD-10-CM | POA: Diagnosis not present

## 2020-08-14 DIAGNOSIS — Z0000 Encounter for general adult medical examination without abnormal findings: Secondary | ICD-10-CM | POA: Diagnosis not present

## 2020-08-14 DIAGNOSIS — I2510 Atherosclerotic heart disease of native coronary artery without angina pectoris: Secondary | ICD-10-CM | POA: Diagnosis not present

## 2020-08-14 DIAGNOSIS — F419 Anxiety disorder, unspecified: Secondary | ICD-10-CM | POA: Diagnosis not present

## 2020-08-14 DIAGNOSIS — M0589 Other rheumatoid arthritis with rheumatoid factor of multiple sites: Secondary | ICD-10-CM | POA: Diagnosis not present

## 2020-08-14 DIAGNOSIS — Z789 Other specified health status: Secondary | ICD-10-CM | POA: Diagnosis not present

## 2020-08-14 DIAGNOSIS — E1159 Type 2 diabetes mellitus with other circulatory complications: Secondary | ICD-10-CM | POA: Diagnosis not present

## 2020-09-03 DIAGNOSIS — M0589 Other rheumatoid arthritis with rheumatoid factor of multiple sites: Secondary | ICD-10-CM | POA: Diagnosis not present

## 2020-09-09 DIAGNOSIS — N390 Urinary tract infection, site not specified: Secondary | ICD-10-CM | POA: Diagnosis not present

## 2020-10-01 DIAGNOSIS — M0589 Other rheumatoid arthritis with rheumatoid factor of multiple sites: Secondary | ICD-10-CM | POA: Diagnosis not present

## 2020-10-03 ENCOUNTER — Ambulatory Visit: Payer: Medicare PPO | Admitting: Internal Medicine

## 2020-10-16 DIAGNOSIS — E119 Type 2 diabetes mellitus without complications: Secondary | ICD-10-CM | POA: Diagnosis not present

## 2020-10-16 DIAGNOSIS — I10 Essential (primary) hypertension: Secondary | ICD-10-CM | POA: Diagnosis not present

## 2020-10-16 DIAGNOSIS — M810 Age-related osteoporosis without current pathological fracture: Secondary | ICD-10-CM | POA: Diagnosis not present

## 2020-10-16 DIAGNOSIS — M170 Bilateral primary osteoarthritis of knee: Secondary | ICD-10-CM | POA: Diagnosis not present

## 2020-10-16 DIAGNOSIS — E785 Hyperlipidemia, unspecified: Secondary | ICD-10-CM | POA: Diagnosis not present

## 2020-10-16 DIAGNOSIS — M0589 Other rheumatoid arthritis with rheumatoid factor of multiple sites: Secondary | ICD-10-CM | POA: Diagnosis not present

## 2020-10-16 DIAGNOSIS — I2510 Atherosclerotic heart disease of native coronary artery without angina pectoris: Secondary | ICD-10-CM | POA: Diagnosis not present

## 2020-10-16 DIAGNOSIS — Z79899 Other long term (current) drug therapy: Secondary | ICD-10-CM | POA: Diagnosis not present

## 2020-11-04 ENCOUNTER — Telehealth: Payer: Self-pay | Admitting: Internal Medicine

## 2020-11-04 NOTE — Telephone Encounter (Signed)
PA for nexletol submitted via CMM (Key: [id number])

## 2020-11-05 DIAGNOSIS — M0589 Other rheumatoid arthritis with rheumatoid factor of multiple sites: Secondary | ICD-10-CM | POA: Diagnosis not present

## 2020-11-05 NOTE — Telephone Encounter (Signed)
Approvedtoday PA Case: [id number], Status: Approved, Coverage Starts on: 11/17/2019 12:00:00 AM, Coverage Ends on: 11/15/2021 12:00:00 AM

## 2020-11-06 DIAGNOSIS — F418 Other specified anxiety disorders: Secondary | ICD-10-CM | POA: Diagnosis not present

## 2020-11-06 DIAGNOSIS — D649 Anemia, unspecified: Secondary | ICD-10-CM | POA: Diagnosis not present

## 2020-11-06 DIAGNOSIS — R002 Palpitations: Secondary | ICD-10-CM | POA: Diagnosis not present

## 2020-11-24 ENCOUNTER — Other Ambulatory Visit: Payer: Self-pay | Admitting: Physician Assistant

## 2020-11-26 ENCOUNTER — Other Ambulatory Visit: Payer: Self-pay | Admitting: Physician Assistant

## 2020-12-11 DIAGNOSIS — M0589 Other rheumatoid arthritis with rheumatoid factor of multiple sites: Secondary | ICD-10-CM | POA: Diagnosis not present

## 2020-12-25 DIAGNOSIS — E785 Hyperlipidemia, unspecified: Secondary | ICD-10-CM | POA: Diagnosis not present

## 2020-12-25 DIAGNOSIS — M0589 Other rheumatoid arthritis with rheumatoid factor of multiple sites: Secondary | ICD-10-CM | POA: Diagnosis not present

## 2020-12-25 DIAGNOSIS — I129 Hypertensive chronic kidney disease with stage 1 through stage 4 chronic kidney disease, or unspecified chronic kidney disease: Secondary | ICD-10-CM | POA: Diagnosis not present

## 2020-12-25 DIAGNOSIS — E1122 Type 2 diabetes mellitus with diabetic chronic kidney disease: Secondary | ICD-10-CM | POA: Diagnosis not present

## 2021-01-08 DIAGNOSIS — M0589 Other rheumatoid arthritis with rheumatoid factor of multiple sites: Secondary | ICD-10-CM | POA: Diagnosis not present

## 2021-01-14 DIAGNOSIS — M810 Age-related osteoporosis without current pathological fracture: Secondary | ICD-10-CM | POA: Diagnosis not present

## 2021-01-14 DIAGNOSIS — Z79899 Other long term (current) drug therapy: Secondary | ICD-10-CM | POA: Diagnosis not present

## 2021-01-14 DIAGNOSIS — E119 Type 2 diabetes mellitus without complications: Secondary | ICD-10-CM | POA: Diagnosis not present

## 2021-01-14 DIAGNOSIS — M0589 Other rheumatoid arthritis with rheumatoid factor of multiple sites: Secondary | ICD-10-CM | POA: Diagnosis not present

## 2021-01-14 DIAGNOSIS — I10 Essential (primary) hypertension: Secondary | ICD-10-CM | POA: Diagnosis not present

## 2021-01-14 DIAGNOSIS — M170 Bilateral primary osteoarthritis of knee: Secondary | ICD-10-CM | POA: Diagnosis not present

## 2021-01-14 DIAGNOSIS — E785 Hyperlipidemia, unspecified: Secondary | ICD-10-CM | POA: Diagnosis not present

## 2021-01-14 DIAGNOSIS — I2510 Atherosclerotic heart disease of native coronary artery without angina pectoris: Secondary | ICD-10-CM | POA: Diagnosis not present

## 2021-02-05 DIAGNOSIS — M0589 Other rheumatoid arthritis with rheumatoid factor of multiple sites: Secondary | ICD-10-CM | POA: Diagnosis not present

## 2021-02-21 DIAGNOSIS — M21612 Bunion of left foot: Secondary | ICD-10-CM | POA: Diagnosis not present

## 2021-02-21 DIAGNOSIS — L602 Onychogryphosis: Secondary | ICD-10-CM | POA: Diagnosis not present

## 2021-02-21 DIAGNOSIS — M21611 Bunion of right foot: Secondary | ICD-10-CM | POA: Diagnosis not present

## 2021-02-21 DIAGNOSIS — L84 Corns and callosities: Secondary | ICD-10-CM | POA: Diagnosis not present

## 2021-02-21 DIAGNOSIS — E1151 Type 2 diabetes mellitus with diabetic peripheral angiopathy without gangrene: Secondary | ICD-10-CM | POA: Diagnosis not present

## 2021-02-21 DIAGNOSIS — I739 Peripheral vascular disease, unspecified: Secondary | ICD-10-CM | POA: Diagnosis not present

## 2021-02-21 DIAGNOSIS — L853 Xerosis cutis: Secondary | ICD-10-CM | POA: Diagnosis not present

## 2021-02-21 DIAGNOSIS — M205X1 Other deformities of toe(s) (acquired), right foot: Secondary | ICD-10-CM | POA: Diagnosis not present

## 2021-03-05 DIAGNOSIS — M0589 Other rheumatoid arthritis with rheumatoid factor of multiple sites: Secondary | ICD-10-CM | POA: Diagnosis not present

## 2021-04-02 DIAGNOSIS — M0589 Other rheumatoid arthritis with rheumatoid factor of multiple sites: Secondary | ICD-10-CM | POA: Diagnosis not present

## 2021-04-03 DIAGNOSIS — I739 Peripheral vascular disease, unspecified: Secondary | ICD-10-CM | POA: Diagnosis not present

## 2021-04-30 DIAGNOSIS — M0589 Other rheumatoid arthritis with rheumatoid factor of multiple sites: Secondary | ICD-10-CM | POA: Diagnosis not present

## 2021-05-01 DIAGNOSIS — I82431 Acute embolism and thrombosis of right popliteal vein: Secondary | ICD-10-CM | POA: Diagnosis not present

## 2021-05-01 DIAGNOSIS — L84 Corns and callosities: Secondary | ICD-10-CM | POA: Diagnosis not present

## 2021-05-01 DIAGNOSIS — I739 Peripheral vascular disease, unspecified: Secondary | ICD-10-CM | POA: Diagnosis not present

## 2021-05-01 DIAGNOSIS — I872 Venous insufficiency (chronic) (peripheral): Secondary | ICD-10-CM | POA: Diagnosis not present

## 2021-05-01 DIAGNOSIS — L602 Onychogryphosis: Secondary | ICD-10-CM | POA: Diagnosis not present

## 2021-05-01 DIAGNOSIS — E1151 Type 2 diabetes mellitus with diabetic peripheral angiopathy without gangrene: Secondary | ICD-10-CM | POA: Diagnosis not present

## 2021-05-21 DIAGNOSIS — M810 Age-related osteoporosis without current pathological fracture: Secondary | ICD-10-CM | POA: Diagnosis not present

## 2021-05-21 DIAGNOSIS — I10 Essential (primary) hypertension: Secondary | ICD-10-CM | POA: Diagnosis not present

## 2021-05-21 DIAGNOSIS — E119 Type 2 diabetes mellitus without complications: Secondary | ICD-10-CM | POA: Diagnosis not present

## 2021-05-21 DIAGNOSIS — M170 Bilateral primary osteoarthritis of knee: Secondary | ICD-10-CM | POA: Diagnosis not present

## 2021-05-21 DIAGNOSIS — M0589 Other rheumatoid arthritis with rheumatoid factor of multiple sites: Secondary | ICD-10-CM | POA: Diagnosis not present

## 2021-05-21 DIAGNOSIS — E785 Hyperlipidemia, unspecified: Secondary | ICD-10-CM | POA: Diagnosis not present

## 2021-05-21 DIAGNOSIS — I2510 Atherosclerotic heart disease of native coronary artery without angina pectoris: Secondary | ICD-10-CM | POA: Diagnosis not present

## 2021-05-21 DIAGNOSIS — Z79899 Other long term (current) drug therapy: Secondary | ICD-10-CM | POA: Diagnosis not present

## 2021-06-02 DIAGNOSIS — M0589 Other rheumatoid arthritis with rheumatoid factor of multiple sites: Secondary | ICD-10-CM | POA: Diagnosis not present

## 2021-06-16 DIAGNOSIS — E785 Hyperlipidemia, unspecified: Secondary | ICD-10-CM | POA: Diagnosis not present

## 2021-06-24 DIAGNOSIS — E785 Hyperlipidemia, unspecified: Secondary | ICD-10-CM | POA: Diagnosis not present

## 2021-06-24 DIAGNOSIS — I2510 Atherosclerotic heart disease of native coronary artery without angina pectoris: Secondary | ICD-10-CM | POA: Diagnosis not present

## 2021-06-24 DIAGNOSIS — I129 Hypertensive chronic kidney disease with stage 1 through stage 4 chronic kidney disease, or unspecified chronic kidney disease: Secondary | ICD-10-CM | POA: Diagnosis not present

## 2021-06-24 DIAGNOSIS — E1122 Type 2 diabetes mellitus with diabetic chronic kidney disease: Secondary | ICD-10-CM | POA: Diagnosis not present

## 2021-06-30 DIAGNOSIS — M0589 Other rheumatoid arthritis with rheumatoid factor of multiple sites: Secondary | ICD-10-CM | POA: Diagnosis not present

## 2021-07-09 ENCOUNTER — Emergency Department (HOSPITAL_COMMUNITY)
Admission: EM | Admit: 2021-07-09 | Discharge: 2021-07-09 | Disposition: A | Discharge status: Home / Self Care | Payer: Medicare PPO | Attending: Emergency Medicine | Admitting: Emergency Medicine

## 2021-07-09 ENCOUNTER — Emergency Department (HOSPITAL_COMMUNITY): Payer: Medicare PPO

## 2021-07-09 ENCOUNTER — Encounter (HOSPITAL_COMMUNITY): Payer: Self-pay | Admitting: *Deleted

## 2021-07-09 DIAGNOSIS — E119 Type 2 diabetes mellitus without complications: Secondary | ICD-10-CM | POA: Insufficient documentation

## 2021-07-09 DIAGNOSIS — I10 Essential (primary) hypertension: Secondary | ICD-10-CM | POA: Diagnosis not present

## 2021-07-09 DIAGNOSIS — Z7984 Long term (current) use of oral hypoglycemic drugs: Secondary | ICD-10-CM | POA: Diagnosis not present

## 2021-07-09 DIAGNOSIS — Z955 Presence of coronary angioplasty implant and graft: Secondary | ICD-10-CM | POA: Insufficient documentation

## 2021-07-09 DIAGNOSIS — I2510 Atherosclerotic heart disease of native coronary artery without angina pectoris: Secondary | ICD-10-CM | POA: Diagnosis not present

## 2021-07-09 DIAGNOSIS — Z7982 Long term (current) use of aspirin: Secondary | ICD-10-CM | POA: Diagnosis not present

## 2021-07-09 DIAGNOSIS — R1310 Dysphagia, unspecified: Secondary | ICD-10-CM | POA: Diagnosis not present

## 2021-07-09 DIAGNOSIS — Z87891 Personal history of nicotine dependence: Secondary | ICD-10-CM | POA: Insufficient documentation

## 2021-07-09 DIAGNOSIS — R918 Other nonspecific abnormal finding of lung field: Secondary | ICD-10-CM | POA: Diagnosis not present

## 2021-07-09 DIAGNOSIS — R059 Cough, unspecified: Secondary | ICD-10-CM | POA: Diagnosis not present

## 2021-07-09 LAB — COMPREHENSIVE METABOLIC PANEL
ALT: 16 U/L (ref 0–44)
AST: 22 U/L (ref 15–41)
Albumin: 3.6 g/dL (ref 3.5–5.0)
Alkaline Phosphatase: 60 U/L (ref 38–126)
Anion gap: 7 (ref 5–15)
BUN: 12 mg/dL (ref 8–23)
CO2: 25 mmol/L (ref 22–32)
Calcium: 9.3 mg/dL (ref 8.9–10.3)
Chloride: 104 mmol/L (ref 98–111)
Creatinine, Ser: 1.02 mg/dL — ABNORMAL HIGH (ref 0.44–1.00)
GFR, Estimated: 56 mL/min — ABNORMAL LOW (ref >60)
Glucose, Bld: 138 mg/dL — ABNORMAL HIGH (ref 70–99)
Potassium: 4 mmol/L (ref 3.5–5.1)
Sodium: 136 mmol/L (ref 135–145)
Total Bilirubin: 1 mg/dL (ref 0.3–1.2)
Total Protein: 7.2 g/dL (ref 6.5–8.1)

## 2021-07-09 LAB — CBC WITH DIFFERENTIAL/PLATELET
Abs Immature Granulocytes: 0.01 10*3/uL (ref 0.00–0.07)
Basophils Absolute: 0 10*3/uL (ref 0.0–0.1)
Basophils Relative: 1 %
Eosinophils Absolute: 0.1 10*3/uL (ref 0.0–0.5)
Eosinophils Relative: 2 %
HCT: 37.6 % (ref 36.0–46.0)
Hemoglobin: 12.2 g/dL (ref 12.0–15.0)
Immature Granulocytes: 0 %
Lymphocytes Relative: 26 %
Lymphs Abs: 1.3 10*3/uL (ref 0.7–4.0)
MCH: 30.7 pg (ref 26.0–34.0)
MCHC: 32.4 g/dL (ref 30.0–36.0)
MCV: 94.7 fL (ref 80.0–100.0)
Monocytes Absolute: 0.8 10*3/uL (ref 0.1–1.0)
Monocytes Relative: 17 %
Neutro Abs: 2.7 10*3/uL (ref 1.7–7.7)
Neutrophils Relative %: 54 %
Platelets: 215 10*3/uL (ref 150–400)
RBC: 3.97 MIL/uL (ref 3.87–5.11)
RDW: 14.6 % (ref 11.5–15.5)
WBC: 4.9 10*3/uL (ref 4.0–10.5)
nRBC: 0 % (ref 0.0–0.2)

## 2021-07-09 LAB — CBG MONITORING, ED: Glucose-Capillary: 101 mg/dL — ABNORMAL HIGH (ref 70–99)

## 2021-07-09 NOTE — ED Triage Notes (Signed)
Pt arrived by [facility] from home. Reports taking tylenol this am and feels like it is stuck in her throat. Hx of same that resolved on its own. Airway intact.

## 2021-07-09 NOTE — Discharge Instructions (Addendum)
Return for any problem.  Follow-up closely with your GI physician.  It appears that you were last seen by Dr. Myrtie Neither with [facility] gastroenterology.  Call tomorrow for an appointment.  You will likely require repeat upper endoscopy.

## 2021-07-09 NOTE — ED Notes (Signed)
Pt stated that Tylenol is no longer stuck in throat.
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
## 2021-07-09 NOTE — ED Provider Notes (Signed)
Emergency Medicine Provider Triage Evaluation Note  [name] [name] , a 80 y.o. female  was evaluated in triage.  Pt complains of Tylenol stuck in her throat from home.  Patient reports a prior episode in the past, which resolved on its own.  She took Tylenol this morning and feels like this got dislodged in her esophagus.  She does have establish care with [facility]. She is tolerating her secretions but reports some dysphagia.   Review of Systems  Positive: Trouble swallowing Negative: Shortness of breath, chest pain,   Physical Exam  BP (!) 148/80   Pulse 76   Temp 98.6 F (37 C)   Resp 15   SpO2 100%  Gen:   Awake, no distress   Resp:  Normal effort  MSK:   Moves extremities without difficulty  Other:  Lungs are clear to auscultation, no obvious gastric sounds ausculted  Medical Decision Making  Medically screening exam initiated at 9:09 AM.  Appropriate orders placed.  GERARD BONUS was informed that the remainder of the evaluation will be completed by another provider, this initial triage assessment does not replace that evaluation, and the importance of remaining in the ED until their evaluation is complete.  Patient here with foreign body in her throat, similar episode in the past. Did not receive glucagon by EMS due to Hx of DM. Order placed for Xray and labs. Mainting her airway without difficulty.      Claude Manges, PA-C 07/09/21 1740    Mancel Bale, MD 07/09/21 [phone]

## 2021-07-28 DIAGNOSIS — M0589 Other rheumatoid arthritis with rheumatoid factor of multiple sites: Secondary | ICD-10-CM | POA: Diagnosis not present

## 2021-08-18 DIAGNOSIS — Z0000 Encounter for general adult medical examination without abnormal findings: Secondary | ICD-10-CM | POA: Diagnosis not present

## 2021-08-18 DIAGNOSIS — M0589 Other rheumatoid arthritis with rheumatoid factor of multiple sites: Secondary | ICD-10-CM | POA: Diagnosis not present

## 2021-08-18 DIAGNOSIS — I129 Hypertensive chronic kidney disease with stage 1 through stage 4 chronic kidney disease, or unspecified chronic kidney disease: Secondary | ICD-10-CM | POA: Diagnosis not present

## 2021-08-18 DIAGNOSIS — Z789 Other specified health status: Secondary | ICD-10-CM | POA: Diagnosis not present

## 2021-08-18 DIAGNOSIS — Z23 Encounter for immunization: Secondary | ICD-10-CM | POA: Diagnosis not present

## 2021-08-18 DIAGNOSIS — M810 Age-related osteoporosis without current pathological fracture: Secondary | ICD-10-CM | POA: Diagnosis not present

## 2021-08-18 DIAGNOSIS — K219 Gastro-esophageal reflux disease without esophagitis: Secondary | ICD-10-CM | POA: Diagnosis not present

## 2021-08-18 DIAGNOSIS — I2510 Atherosclerotic heart disease of native coronary artery without angina pectoris: Secondary | ICD-10-CM | POA: Diagnosis not present

## 2021-08-18 DIAGNOSIS — L0390 Cellulitis, unspecified: Secondary | ICD-10-CM | POA: Diagnosis not present

## 2021-08-19 DIAGNOSIS — E1151 Type 2 diabetes mellitus with diabetic peripheral angiopathy without gangrene: Secondary | ICD-10-CM | POA: Diagnosis not present

## 2021-08-19 DIAGNOSIS — I739 Peripheral vascular disease, unspecified: Secondary | ICD-10-CM | POA: Diagnosis not present

## 2021-08-19 DIAGNOSIS — M792 Neuralgia and neuritis, unspecified: Secondary | ICD-10-CM | POA: Diagnosis not present

## 2021-08-19 DIAGNOSIS — R262 Difficulty in walking, not elsewhere classified: Secondary | ICD-10-CM | POA: Diagnosis not present

## 2021-08-19 DIAGNOSIS — M21612 Bunion of left foot: Secondary | ICD-10-CM | POA: Diagnosis not present

## 2021-08-19 DIAGNOSIS — L603 Nail dystrophy: Secondary | ICD-10-CM | POA: Diagnosis not present

## 2021-08-19 DIAGNOSIS — L84 Corns and callosities: Secondary | ICD-10-CM | POA: Diagnosis not present

## 2021-08-19 DIAGNOSIS — M79604 Pain in right leg: Secondary | ICD-10-CM | POA: Diagnosis not present

## 2021-08-19 DIAGNOSIS — B351 Tinea unguium: Secondary | ICD-10-CM | POA: Diagnosis not present

## 2021-08-22 ENCOUNTER — Other Ambulatory Visit: Payer: Self-pay

## 2021-08-22 MED ORDER — LOSARTAN POTASSIUM 25 MG PO TABS: ORAL_TABLET | ORAL | 0 refills | Status: DC

## 2021-08-23 ENCOUNTER — Other Ambulatory Visit: Payer: Self-pay | Admitting: Cardiovascular Disease

## 2021-08-25 ENCOUNTER — Encounter: Payer: Self-pay | Admitting: Cardiovascular Disease

## 2021-08-25 ENCOUNTER — Telehealth: Payer: Self-pay | Admitting: Cardiovascular Disease

## 2021-08-25 NOTE — Telephone Encounter (Signed)
*  STAT* If patient is at the pharmacy, call can be transferred to refill team.   1. Which medications need to be refilled? (please list name of each medication and dose if known)  isosorbide mononitrate (IMDUR) 30 MG 24 hr tablet  2. Which pharmacy/location (including street and city if local pharmacy) is medication to be sent to? [id number] - [place], [place] - [address] AT [facility] OF [address]  3. Do they need a 30 day or 90 day supply?   Patient is requesting enough medication to last her until her appointment on 11/27/21 with Dr. Elease Hashimoto. She states she is completely out.

## 2021-08-25 NOTE — Telephone Encounter (Signed)
Error

## 2021-08-26 ENCOUNTER — Other Ambulatory Visit: Payer: Self-pay

## 2021-08-26 MED ORDER — ISOSORBIDE MONONITRATE ER 30 MG PO TB24: ORAL_TABLET | ORAL | 0 refills | Status: DC

## 2021-08-26 NOTE — Telephone Encounter (Signed)
Pt's medication was sent to pt's pharmacy as requested. Confirmation received.  °

## 2021-09-08 DIAGNOSIS — M0589 Other rheumatoid arthritis with rheumatoid factor of multiple sites: Secondary | ICD-10-CM | POA: Diagnosis not present

## 2021-09-24 DIAGNOSIS — M170 Bilateral primary osteoarthritis of knee: Secondary | ICD-10-CM | POA: Diagnosis not present

## 2021-09-24 DIAGNOSIS — I10 Essential (primary) hypertension: Secondary | ICD-10-CM | POA: Diagnosis not present

## 2021-09-24 DIAGNOSIS — I2510 Atherosclerotic heart disease of native coronary artery without angina pectoris: Secondary | ICD-10-CM | POA: Diagnosis not present

## 2021-09-24 DIAGNOSIS — M79606 Pain in leg, unspecified: Secondary | ICD-10-CM | POA: Diagnosis not present

## 2021-09-24 DIAGNOSIS — M810 Age-related osteoporosis without current pathological fracture: Secondary | ICD-10-CM | POA: Diagnosis not present

## 2021-09-24 DIAGNOSIS — E119 Type 2 diabetes mellitus without complications: Secondary | ICD-10-CM | POA: Diagnosis not present

## 2021-09-24 DIAGNOSIS — M0589 Other rheumatoid arthritis with rheumatoid factor of multiple sites: Secondary | ICD-10-CM | POA: Diagnosis not present

## 2021-09-24 DIAGNOSIS — Z79899 Other long term (current) drug therapy: Secondary | ICD-10-CM | POA: Diagnosis not present

## 2021-09-24 DIAGNOSIS — E785 Hyperlipidemia, unspecified: Secondary | ICD-10-CM | POA: Diagnosis not present

## 2021-10-07 DIAGNOSIS — M0589 Other rheumatoid arthritis with rheumatoid factor of multiple sites: Secondary | ICD-10-CM | POA: Diagnosis not present

## 2021-10-14 ENCOUNTER — Other Ambulatory Visit: Payer: Self-pay | Admitting: Cardiovascular Disease

## 2021-10-20 ENCOUNTER — Other Ambulatory Visit: Payer: Self-pay | Admitting: Physician Assistant

## 2021-11-05 DIAGNOSIS — M0589 Other rheumatoid arthritis with rheumatoid factor of multiple sites: Secondary | ICD-10-CM | POA: Diagnosis not present

## 2021-11-13 ENCOUNTER — Telehealth: Payer: Self-pay

## 2021-11-13 NOTE — Telephone Encounter (Signed)
PA Case: [id number], Status: Approved, by [facility] Key: [id number], Coverage Starts on: 11/16/2020 12:00:00 AM, Coverage Ends on: 11/15/2022 12:00:00 AM.

## 2021-11-21 ENCOUNTER — Other Ambulatory Visit: Payer: Self-pay | Admitting: Cardiovascular Disease

## 2021-11-25 DIAGNOSIS — B351 Tinea unguium: Secondary | ICD-10-CM | POA: Diagnosis not present

## 2021-11-25 DIAGNOSIS — E1151 Type 2 diabetes mellitus with diabetic peripheral angiopathy without gangrene: Secondary | ICD-10-CM | POA: Diagnosis not present

## 2021-11-25 DIAGNOSIS — I739 Peripheral vascular disease, unspecified: Secondary | ICD-10-CM | POA: Diagnosis not present

## 2021-11-25 DIAGNOSIS — L84 Corns and callosities: Secondary | ICD-10-CM | POA: Diagnosis not present

## 2021-11-25 DIAGNOSIS — M792 Neuralgia and neuritis, unspecified: Secondary | ICD-10-CM | POA: Diagnosis not present

## 2021-11-25 DIAGNOSIS — M21612 Bunion of left foot: Secondary | ICD-10-CM | POA: Diagnosis not present

## 2021-11-25 DIAGNOSIS — R262 Difficulty in walking, not elsewhere classified: Secondary | ICD-10-CM | POA: Diagnosis not present

## 2021-11-25 DIAGNOSIS — M79604 Pain in right leg: Secondary | ICD-10-CM | POA: Diagnosis not present

## 2021-11-25 DIAGNOSIS — L603 Nail dystrophy: Secondary | ICD-10-CM | POA: Diagnosis not present

## 2021-11-26 ENCOUNTER — Encounter: Payer: Self-pay | Admitting: Cardiovascular Disease

## 2021-11-26 NOTE — Progress Notes (Signed)
This encounter was created in error - please disregard.

## 2021-11-27 ENCOUNTER — Other Ambulatory Visit: Payer: Self-pay

## 2021-11-27 ENCOUNTER — Encounter: Payer: Self-pay | Admitting: Cardiovascular Disease

## 2021-11-27 ENCOUNTER — Encounter (INDEPENDENT_AMBULATORY_CARE_PROVIDER_SITE_OTHER): Payer: Medicare PPO | Admitting: Cardiovascular Disease

## 2021-11-27 VITALS — BP 136/76 | HR 69 | Ht 62.0 in | Wt 147.2 lb

## 2021-11-27 DIAGNOSIS — I25119 Atherosclerotic heart disease of native coronary artery with unspecified angina pectoris: Secondary | ICD-10-CM

## 2021-11-27 DIAGNOSIS — E782 Mixed hyperlipidemia: Secondary | ICD-10-CM

## 2021-11-27 DIAGNOSIS — F419 Anxiety disorder, unspecified: Secondary | ICD-10-CM | POA: Diagnosis not present

## 2021-11-27 LAB — CBC
Hematocrit: 33.6 % — ABNORMAL LOW (ref 34.0–46.6)
Hemoglobin: 11.7 g/dL (ref 11.1–15.9)
MCH: 30.2 pg (ref 26.6–33.0)
MCHC: 34.8 g/dL (ref 31.5–35.7)
MCV: 87 fL (ref 79–97)
Platelets: 202 10*3/uL (ref 150–450)
RBC: 3.87 x10E6/uL (ref 3.77–5.28)
RDW: 13.7 % (ref 11.7–15.4)
WBC: 5.3 10*3/uL (ref 3.4–10.8)

## 2021-11-27 LAB — LIPID PANEL
Chol/HDL Ratio: 2.3 ratio (ref 0.0–4.4)
Cholesterol, Total: 170 mg/dL (ref 100–199)
HDL: 73 mg/dL (ref >39)
LDL Chol Calc (NIH): 89 mg/dL (ref 0–99)
Triglycerides: 37 mg/dL (ref 0–149)
VLDL Cholesterol Cal: 8 mg/dL (ref 5–40)

## 2021-11-27 LAB — COMPREHENSIVE METABOLIC PANEL
ALT: 15 IU/L (ref 0–32)
AST: 24 IU/L (ref 0–40)
Albumin/Globulin Ratio: 1.3 (ref 1.2–2.2)
Albumin: 3.9 g/dL (ref 3.7–4.7)
Alkaline Phosphatase: 70 IU/L (ref 44–121)
BUN/Creatinine Ratio: 11 — ABNORMAL LOW (ref 12–28)
BUN: 10 mg/dL (ref 8–27)
Bilirubin Total: 0.7 mg/dL (ref 0.0–1.2)
CO2: 25 mmol/L (ref 20–29)
Calcium: 9.5 mg/dL (ref 8.7–10.3)
Chloride: 105 mmol/L (ref 96–106)
Creatinine, Ser: 0.88 mg/dL (ref 0.57–1.00)
Globulin, Total: 3 g/dL (ref 1.5–4.5)
Glucose: 120 mg/dL — ABNORMAL HIGH (ref 70–99)
Potassium: 4.6 mmol/L (ref 3.5–5.2)
Sodium: 140 mmol/L (ref 134–144)
Total Protein: 6.9 g/dL (ref 6.0–8.5)
eGFR: 66 mL/min/{1.73_m2} (ref >59)

## 2021-11-27 MED ORDER — LOSARTAN POTASSIUM 25 MG PO TABS: 25.0000 mg | ORAL_TABLET | Freq: Every day | ORAL | 3 refills | Status: DC

## 2021-11-27 MED ORDER — POTASSIUM CHLORIDE ER 10 MEQ PO TBCR: 10.0000 meq | EXTENDED_RELEASE_TABLET | ORAL | 3 refills | Status: DC

## 2021-11-27 MED ORDER — ISOSORBIDE MONONITRATE ER 30 MG PO TB24: 60.0000 mg | ORAL_TABLET | Freq: Every day | ORAL | 3 refills | Status: DC

## 2021-11-27 MED ORDER — FUROSEMIDE 40 MG PO TABS: 40.0000 mg | ORAL_TABLET | ORAL | 3 refills | Status: DC

## 2021-11-27 NOTE — Patient Instructions (Addendum)
Medication Instructions:  Your physician has recommended you make the following change in your medication:  1- Increase Imdur 60 mg (2 tablets) by mouth daily 2-START Lasix 40 mg by mouth on Monday, Wednesday, and Friday 3-START Potassium 10 meq by mouth on Monday, Wednesday, and Friday  *If you need a refill on your cardiac medications before your next appointment, please call your pharmacy*  Lab Work: Your physician recommends that you have lab work today- CMET, CBC and Lipid panel  If you have labs (blood work) drawn today and your tests are completely normal, you will receive your results only by: MyChart Message (if you have MyChart) OR A paper copy in the mail If you have any lab test that is abnormal or we need to change your treatment, we will call you to review the results.  Testing/Procedures: Your physician has requested that you have a cardiac catheterization. Cardiac catheterization is used to diagnose and/or treat various heart conditions. Doctors may recommend this procedure for a number of different reasons. The most common reason is to evaluate chest pain. Chest pain can be a symptom of coronary artery disease (CAD), and cardiac catheterization can show whether plaque is narrowing or blocking your hearts arteries. This procedure is also used to evaluate the valves, as well as measure the blood flow and oxygen levels in different parts of your heart. For further information please visit [email or web address] Please follow instruction sheet, as given.  Follow-Up: At [facility], you and your health needs are our priority.  As part of our continuing mission to provide you with exceptional heart care, we have created designated Provider Care Teams.  These Care Teams include your primary Cardiologist (physician) and Advanced Practice Providers (APPs -  Physician Assistants and Nurse Practitioners) who all work together to provide you with the care you need, when you need it.  We  recommend signing up for the patient portal called "MyChart".  Sign up information is provided on this After Visit Summary.  MyChart is used to connect with patients for Virtual Visits (Telemedicine).  Patients are able to view lab/test results, encounter notes, upcoming appointments, etc.  Non-urgent messages can be sent to your provider as well.   To learn more about what you can do with MyChart, go to [email or web address].    Your next appointment:   3 month(s)  The format for your next appointment:   In Person  Provider:   Tereso Newcomer PA  Other Instructions  [facility] CARDIOVASCULAR DIVISION [facility] [address] OFFICE [name] [place] [address] Dept: [phone] Loc: [phone]  CHELLE PEARL  11/27/2021  You are scheduled for a Cardiac Catheterization on Tuesday, January 17 with Dr. Bryan Lemma.  1. Please arrive at the [facility] (Main Entrance A) at [facility]: [address] [place], [place] [address] at 10:00 AM (This time is two hours before your procedure to ensure your preparation). Free valet parking service is available.   Special note: Every effort is made to have your procedure done on time. Please understand that emergencies sometimes delay scheduled procedures.  2. Diet: Do not eat solid foods after midnight.  The patient may have clear liquids until 5am upon the day of the procedure.  3. Labs: You will need to have blood drawn on Thursday, January 12 at [facility]. [address], [place]  Open: 7:30am - 5pm    Phone: [phone]. You  do not need to be fasting.  4. Medication instructions in preparation for your procedure:   Contrast Allergy: No  Do not take any insulin on the day of the procedure.  On the morning of your procedure, take your Aspirin and any morning medicines NOT listed above.  You may use sips of water.  5. Plan for one night  stay--bring personal belongings. 6. Bring a current list of your medications and current insurance cards. 7. You MUST have a responsible person to drive you home. 8. Someone MUST be with you the first 24 hours after you arrive home or your discharge will be delayed. 9. Please wear clothes that are easy to get on and off and wear slip-on shoes.  Thank you for allowing [place] to care for you!   -- [place] [facility]

## 2021-11-28 ENCOUNTER — Other Ambulatory Visit: Payer: Self-pay | Admitting: Cardiovascular Disease

## 2021-11-28 DIAGNOSIS — I200 Unstable angina: Secondary | ICD-10-CM

## 2021-11-28 MED ORDER — SODIUM CHLORIDE 0.9% FLUSH: 3.0000 mL | Freq: Two times a day (BID) | INTRAVENOUS | Status: DC

## 2021-12-01 ENCOUNTER — Telehealth: Payer: Self-pay | Admitting: *Deleted

## 2021-12-01 NOTE — Telephone Encounter (Signed)
Cardiac catheterization scheduled at [facility] for: Tuesday December 02, 2021 12 Noon Arrive [facility] Main Entrance A [facility]) at: 10 AM   Diet-no solid food after midnight prior to cath, clear liquids until 5 AM day of procedure.  Medication instructions for procedure: -Hold:  Insulin-AM of procedure/1/2 usual Insulin HS prior to procedure  Lasix/KCl-AM of procedure-usually takes M-W-F -Except hold medications usual morning medications can be taken pre-cath with sips of water including aspirin 81 mg.    Confirmed patient has responsible adult to drive home post procedure and be with patient first 24 hours after arriving home.  [facility] does allow one visitor to accompany you and wait in the hospital waiting room while you are there for your procedure. You and your visitor will be asked to wear a mask once you enter the hospital.   Patient reports does not currently have any new symptoms concerning for COVID-19 and no household members with COVID-19 like illness.     Reviewed procedure/mask/visitor instructions with patient.

## 2021-12-02 ENCOUNTER — Inpatient Hospital Stay (HOSPITAL_COMMUNITY): Payer: Medicare PPO

## 2021-12-02 ENCOUNTER — Inpatient Hospital Stay (HOSPITAL_COMMUNITY)
Admission: RE | Admit: 2021-12-02 | Discharge: 2021-12-16 | DRG: 234 | Disposition: A | Discharge status: Home Health | Payer: Medicare PPO | Attending: Thoracic Surgery (Cardiothoracic Vascular Surgery) | Admitting: Thoracic Surgery (Cardiothoracic Vascular Surgery)

## 2021-12-02 ENCOUNTER — Inpatient Hospital Stay (HOSPITAL_COMMUNITY)
Admission: RE | Disposition: A | Discharge status: Home Health | Payer: Self-pay | Source: Home / Self Care | Attending: Thoracic Surgery (Cardiothoracic Vascular Surgery)

## 2021-12-02 ENCOUNTER — Other Ambulatory Visit: Payer: Self-pay

## 2021-12-02 DIAGNOSIS — Z885 Allergy status to narcotic agent status: Secondary | ICD-10-CM | POA: Diagnosis not present

## 2021-12-02 DIAGNOSIS — Z87891 Personal history of nicotine dependence: Secondary | ICD-10-CM | POA: Diagnosis not present

## 2021-12-02 DIAGNOSIS — Z951 Presence of aortocoronary bypass graft: Secondary | ICD-10-CM

## 2021-12-02 DIAGNOSIS — M069 Rheumatoid arthritis, unspecified: Secondary | ICD-10-CM | POA: Diagnosis present

## 2021-12-02 DIAGNOSIS — J90 Pleural effusion, not elsewhere classified: Secondary | ICD-10-CM | POA: Diagnosis not present

## 2021-12-02 DIAGNOSIS — I25118 Atherosclerotic heart disease of native coronary artery with other forms of angina pectoris: Principal | ICD-10-CM | POA: Diagnosis present

## 2021-12-02 DIAGNOSIS — I25110 Atherosclerotic heart disease of native coronary artery with unstable angina pectoris: Secondary | ICD-10-CM | POA: Diagnosis not present

## 2021-12-02 DIAGNOSIS — Z01810 Encounter for preprocedural cardiovascular examination: Secondary | ICD-10-CM

## 2021-12-02 DIAGNOSIS — R0602 Shortness of breath: Secondary | ICD-10-CM | POA: Diagnosis present

## 2021-12-02 DIAGNOSIS — J9811 Atelectasis: Secondary | ICD-10-CM | POA: Diagnosis not present

## 2021-12-02 DIAGNOSIS — Z794 Long term (current) use of insulin: Secondary | ICD-10-CM

## 2021-12-02 DIAGNOSIS — I252 Old myocardial infarction: Secondary | ICD-10-CM

## 2021-12-02 DIAGNOSIS — I2510 Atherosclerotic heart disease of native coronary artery without angina pectoris: Principal | ICD-10-CM | POA: Diagnosis present

## 2021-12-02 DIAGNOSIS — E871 Hypo-osmolality and hyponatremia: Secondary | ICD-10-CM | POA: Diagnosis not present

## 2021-12-02 DIAGNOSIS — Z79899 Other long term (current) drug therapy: Secondary | ICD-10-CM

## 2021-12-02 DIAGNOSIS — Z833 Family history of diabetes mellitus: Secondary | ICD-10-CM

## 2021-12-02 DIAGNOSIS — D6959 Other secondary thrombocytopenia: Secondary | ICD-10-CM | POA: Diagnosis not present

## 2021-12-02 DIAGNOSIS — J939 Pneumothorax, unspecified: Secondary | ICD-10-CM | POA: Diagnosis not present

## 2021-12-02 DIAGNOSIS — B370 Candidal stomatitis: Secondary | ICD-10-CM | POA: Diagnosis not present

## 2021-12-02 DIAGNOSIS — E872 Acidosis, unspecified: Secondary | ICD-10-CM | POA: Diagnosis not present

## 2021-12-02 DIAGNOSIS — R579 Shock, unspecified: Secondary | ICD-10-CM | POA: Diagnosis not present

## 2021-12-02 DIAGNOSIS — E755 Other lipid storage disorders: Secondary | ICD-10-CM | POA: Diagnosis not present

## 2021-12-02 DIAGNOSIS — I200 Unstable angina: Secondary | ICD-10-CM

## 2021-12-02 DIAGNOSIS — Z20822 Contact with and (suspected) exposure to covid-19: Secondary | ICD-10-CM | POA: Diagnosis not present

## 2021-12-02 DIAGNOSIS — D62 Acute posthemorrhagic anemia: Secondary | ICD-10-CM | POA: Diagnosis not present

## 2021-12-02 DIAGNOSIS — E1165 Type 2 diabetes mellitus with hyperglycemia: Secondary | ICD-10-CM | POA: Diagnosis not present

## 2021-12-02 DIAGNOSIS — I779 Disorder of arteries and arterioles, unspecified: Secondary | ICD-10-CM | POA: Diagnosis not present

## 2021-12-02 DIAGNOSIS — E119 Type 2 diabetes mellitus without complications: Secondary | ICD-10-CM | POA: Diagnosis not present

## 2021-12-02 DIAGNOSIS — Z7982 Long term (current) use of aspirin: Secondary | ICD-10-CM | POA: Diagnosis not present

## 2021-12-02 DIAGNOSIS — E785 Hyperlipidemia, unspecified: Secondary | ICD-10-CM | POA: Diagnosis not present

## 2021-12-02 DIAGNOSIS — I10 Essential (primary) hypertension: Secondary | ICD-10-CM | POA: Diagnosis present

## 2021-12-02 DIAGNOSIS — R918 Other nonspecific abnormal finding of lung field: Secondary | ICD-10-CM | POA: Diagnosis not present

## 2021-12-02 DIAGNOSIS — E8770 Fluid overload, unspecified: Secondary | ICD-10-CM | POA: Diagnosis not present

## 2021-12-02 DIAGNOSIS — Z01811 Encounter for preprocedural respiratory examination: Secondary | ICD-10-CM

## 2021-12-02 DIAGNOSIS — Z8249 Family history of ischemic heart disease and other diseases of the circulatory system: Secondary | ICD-10-CM

## 2021-12-02 DIAGNOSIS — I493 Ventricular premature depolarization: Secondary | ICD-10-CM | POA: Diagnosis not present

## 2021-12-02 DIAGNOSIS — Z888 Allergy status to other drugs, medicaments and biological substances status: Secondary | ICD-10-CM

## 2021-12-02 DIAGNOSIS — Z4682 Encounter for fitting and adjustment of non-vascular catheter: Secondary | ICD-10-CM | POA: Diagnosis not present

## 2021-12-02 DIAGNOSIS — Z9911 Dependence on respirator [ventilator] status: Secondary | ICD-10-CM | POA: Diagnosis not present

## 2021-12-02 DIAGNOSIS — K5900 Constipation, unspecified: Secondary | ICD-10-CM | POA: Diagnosis not present

## 2021-12-02 DIAGNOSIS — D689 Coagulation defect, unspecified: Secondary | ICD-10-CM | POA: Diagnosis not present

## 2021-12-02 DIAGNOSIS — R1310 Dysphagia, unspecified: Secondary | ICD-10-CM | POA: Diagnosis not present

## 2021-12-02 DIAGNOSIS — Z789 Other specified health status: Secondary | ICD-10-CM

## 2021-12-02 DIAGNOSIS — I081 Rheumatic disorders of both mitral and tricuspid valves: Secondary | ICD-10-CM | POA: Diagnosis not present

## 2021-12-02 DIAGNOSIS — Z452 Encounter for adjustment and management of vascular access device: Secondary | ICD-10-CM | POA: Diagnosis not present

## 2021-12-02 HISTORY — PX: LEFT HEART CATH AND CORONARY ANGIOGRAPHY: CATH118249

## 2021-12-02 LAB — MRSA NEXT GEN BY PCR, NASAL: MRSA by PCR Next Gen: NOT DETECTED

## 2021-12-02 LAB — GLUCOSE, CAPILLARY
Glucose-Capillary: 112 mg/dL — ABNORMAL HIGH (ref 70–99)
Glucose-Capillary: 96 mg/dL (ref 70–99)
Glucose-Capillary: 135 mg/dL — ABNORMAL HIGH (ref 70–99)

## 2021-12-02 LAB — ECHOCARDIOGRAM COMPLETE
AR max vel: 2.09 cm2
AV Area VTI: 2.13 cm2
AV Area mean vel: 2.12 cm2
AV Mean grad: 6 mmHg
AV Peak grad: 11.3 mmHg
Ao pk vel: 1.68 m/s
Area-P 1/2: 3.65 cm2
Height: 62 in
S' Lateral: 2.9 cm
Weight: 2352 oz

## 2021-12-02 LAB — SARS CORONAVIRUS 2 BY RT PCR (HOSPITAL ORDER, PERFORMED IN ~~LOC~~ HOSPITAL LAB): SARS Coronavirus 2: NEGATIVE

## 2021-12-02 SURGERY — LEFT HEART CATH AND CORONARY ANGIOGRAPHY
Anesthesia: LOCAL

## 2021-12-02 MED ORDER — HEPARIN (PORCINE) 25000 UT/250ML-% IV SOLN
1000.0000 [IU]/h | INTRAVENOUS | Status: DC
  Administered 2021-12-02: 750 [IU]/h via INTRAVENOUS
  Administered 2021-12-04: 1000 [IU]/h via INTRAVENOUS
  Filled 2021-12-02 (×2): qty 250

## 2021-12-02 MED ORDER — LIDOCAINE HCL (PF) 1 % IJ SOLN
INTRAMUSCULAR | Status: AC
  Filled 2021-12-02: qty 30

## 2021-12-02 MED ORDER — LIDOCAINE HCL (PF) 1 % IJ SOLN
INTRAMUSCULAR | Status: DC | PRN
  Administered 2021-12-02: 2 mL

## 2021-12-02 MED ORDER — LOSARTAN POTASSIUM 25 MG PO TABS
25.0000 mg | ORAL_TABLET | Freq: Every day | ORAL | Status: DC
  Administered 2021-12-02 – 2021-12-08 (×7): 25 mg via ORAL
  Filled 2021-12-02 (×7): qty 1

## 2021-12-02 MED ORDER — SODIUM CHLORIDE 0.9 % WEIGHT BASED INFUSION: 1.0000 mL/kg/h | INTRAVENOUS | Status: DC

## 2021-12-02 MED ORDER — FOLIC ACID 1 MG PO TABS
1.0000 mg | ORAL_TABLET | Freq: Every evening | ORAL | Status: DC
  Administered 2021-12-02 – 2021-12-07 (×6): 1 mg via ORAL
  Filled 2021-12-02 (×6): qty 1

## 2021-12-02 MED ORDER — HEPARIN (PORCINE) IN NACL 1000-0.9 UT/500ML-% IV SOLN
INTRAVENOUS | Status: DC | PRN
  Administered 2021-12-02 (×2): 500 mL

## 2021-12-02 MED ORDER — VERAPAMIL HCL 2.5 MG/ML IV SOLN
INTRAVENOUS | Status: DC | PRN
  Administered 2021-12-02: 10 mL via INTRA_ARTERIAL

## 2021-12-02 MED ORDER — NITROGLYCERIN IN D5W 200-5 MCG/ML-% IV SOLN
10.0000 ug/min | INTRAVENOUS | Status: DC
  Administered 2021-12-08: 16.6 ug/min via INTRAVENOUS

## 2021-12-02 MED ORDER — ASPIRIN 81 MG PO CHEW: 81.0000 mg | CHEWABLE_TABLET | ORAL | Status: DC

## 2021-12-02 MED ORDER — SODIUM CHLORIDE 0.9 % IV SOLN
250.0000 mL | INTRAVENOUS | Status: DC | PRN
  Administered 2021-12-03: 250 mL via INTRAVENOUS

## 2021-12-02 MED ORDER — ACETAMINOPHEN 325 MG PO TABS
650.0000 mg | ORAL_TABLET | ORAL | Status: DC | PRN
  Administered 2021-12-07: 650 mg via ORAL
  Filled 2021-12-02: qty 2

## 2021-12-02 MED ORDER — MIDAZOLAM HCL 2 MG/2ML IJ SOLN
INTRAMUSCULAR | Status: DC | PRN
  Administered 2021-12-02: 1 mg via INTRAVENOUS

## 2021-12-02 MED ORDER — SODIUM CHLORIDE 0.9% FLUSH
3.0000 mL | Freq: Two times a day (BID) | INTRAVENOUS | Status: DC
  Administered 2021-12-02 – 2021-12-05 (×6): 3 mL via INTRAVENOUS

## 2021-12-02 MED ORDER — MIDAZOLAM HCL 2 MG/2ML IJ SOLN
INTRAMUSCULAR | Status: AC
  Filled 2021-12-02: qty 2

## 2021-12-02 MED ORDER — ONDANSETRON HCL 4 MG/2ML IJ SOLN: 4.0000 mg | Freq: Four times a day (QID) | INTRAMUSCULAR | Status: DC | PRN

## 2021-12-02 MED ORDER — SODIUM CHLORIDE 0.9% FLUSH: 3.0000 mL | INTRAVENOUS | Status: DC | PRN

## 2021-12-02 MED ORDER — NITROGLYCERIN 1 MG/10 ML FOR IR/CATH LAB
INTRA_ARTERIAL | Status: AC
  Filled 2021-12-02: qty 10

## 2021-12-02 MED ORDER — METHOTREXATE SODIUM CHEMO INJECTION 250 MG/10ML: 10.0000 mg | INTRAMUSCULAR | Status: DC

## 2021-12-02 MED ORDER — FENTANYL CITRATE (PF) 100 MCG/2ML IJ SOLN
INTRAMUSCULAR | Status: AC
  Filled 2021-12-02: qty 2

## 2021-12-02 MED ORDER — IOHEXOL 350 MG/ML SOLN
INTRAVENOUS | Status: DC | PRN
  Administered 2021-12-02: 70 mL

## 2021-12-02 MED ORDER — HEPARIN SODIUM (PORCINE) 1000 UNIT/ML IJ SOLN
INTRAMUSCULAR | Status: DC | PRN
  Administered 2021-12-02: 3500 [IU] via INTRAVENOUS

## 2021-12-02 MED ORDER — SODIUM CHLORIDE 0.9 % IV SOLN: INTRAVENOUS | Status: AC

## 2021-12-02 MED ORDER — HYDRALAZINE HCL 20 MG/ML IJ SOLN: 10.0000 mg | INTRAMUSCULAR | Status: AC | PRN

## 2021-12-02 MED ORDER — SODIUM CHLORIDE 0.9 % IV SOLN
250.0000 mL | INTRAVENOUS | Status: DC | PRN
  Administered 2021-12-02: 250 mL via INTRAVENOUS

## 2021-12-02 MED ORDER — METOPROLOL TARTRATE 5 MG/5ML IV SOLN
INTRAVENOUS | Status: DC | PRN
  Administered 2021-12-02: 5 mg via INTRAVENOUS
  Administered 2021-12-02: 2.5 mg via INTRAVENOUS

## 2021-12-02 MED ORDER — HEPARIN SODIUM (PORCINE) 1000 UNIT/ML IJ SOLN
INTRAMUSCULAR | Status: AC
  Filled 2021-12-02: qty 10

## 2021-12-02 MED ORDER — METOPROLOL TARTRATE 5 MG/5ML IV SOLN
INTRAVENOUS | Status: AC
  Filled 2021-12-02: qty 5

## 2021-12-02 MED ORDER — HEPARIN (PORCINE) IN NACL 1000-0.9 UT/500ML-% IV SOLN
INTRAVENOUS | Status: AC
  Filled 2021-12-02: qty 1000

## 2021-12-02 MED ORDER — NITROGLYCERIN 0.4 MG SL SUBL: 0.4000 mg | SUBLINGUAL_TABLET | SUBLINGUAL | Status: DC | PRN

## 2021-12-02 MED ORDER — METOPROLOL TARTRATE 25 MG PO TABS
25.0000 mg | ORAL_TABLET | Freq: Two times a day (BID) | ORAL | Status: DC
  Administered 2021-12-02: 25 mg via ORAL
  Filled 2021-12-02 (×2): qty 1

## 2021-12-02 MED ORDER — SODIUM CHLORIDE 0.9 % WEIGHT BASED INFUSION
3.0000 mL/kg/h | INTRAVENOUS | Status: DC
  Administered 2021-12-02: 3 mL/kg/h via INTRAVENOUS

## 2021-12-02 MED ORDER — NITROGLYCERIN IN D5W 200-5 MCG/ML-% IV SOLN
INTRAVENOUS | Status: AC | PRN
  Administered 2021-12-02: 10 ug/min via INTRAVENOUS

## 2021-12-02 MED ORDER — LABETALOL HCL 5 MG/ML IV SOLN: 10.0000 mg | INTRAVENOUS | Status: AC | PRN

## 2021-12-02 MED ORDER — ASPIRIN 81 MG PO CHEW
81.0000 mg | CHEWABLE_TABLET | Freq: Every day | ORAL | Status: DC
  Administered 2021-12-03 – 2021-12-08 (×6): 81 mg via ORAL
  Filled 2021-12-02 (×6): qty 1

## 2021-12-02 MED ORDER — VERAPAMIL HCL 2.5 MG/ML IV SOLN
INTRAVENOUS | Status: AC
  Filled 2021-12-02: qty 2

## 2021-12-02 MED ORDER — FENTANYL CITRATE (PF) 100 MCG/2ML IJ SOLN
INTRAMUSCULAR | Status: DC | PRN
  Administered 2021-12-02: 25 ug via INTRAVENOUS

## 2021-12-02 SURGICAL SUPPLY — 11 items
CATH INFINITI 5 FR JL3.5 (CATHETERS) ×1 IMPLANT
CATH INFINITI JR4 5F (CATHETERS) ×1 IMPLANT
DEVICE RAD COMP TR BAND LRG (VASCULAR PRODUCTS) ×1 IMPLANT
GLIDESHEATH SLEND A-KIT 6F 22G (SHEATH) ×1 IMPLANT
GUIDEWIRE INQWIRE 1.5J.035X260 (WIRE) IMPLANT
INQWIRE 1.5J .035X260CM (WIRE) ×2
KIT HEART LEFT (KITS) ×2 IMPLANT
PACK CARDIAC CATHETERIZATION (CUSTOM PROCEDURE TRAY) ×2 IMPLANT
SYR MEDRAD MARK 7 150ML (SYRINGE) ×2 IMPLANT
TRANSDUCER W/STOPCOCK (MISCELLANEOUS) ×2 IMPLANT
TUBING CIL FLEX 10 FLL-RA (TUBING) ×2 IMPLANT

## 2021-12-02 NOTE — Consult Note (Addendum)
[address]       [place], [address]             [phone]        Kailly Y Revard Rackerby Medical Record [id number] Date of Birth: 09/19/1941  Referring: Daneen Schick, MD  Primary Care: Janie Morning, DO Primary Cardiologist:Philip Nahser, MD  Chief Complaint: Chest pain Reason for consultation: Coronary artery disease  History of Present Illness:     This is an 81 year female with a past medical history of diabetes mellitus, dyslipidemia (statin intolerant), remote tobacco abuse, rheumatoid arthritis who has had complaints of chest heaviness, pain between shoulders, a feeling of overall "uneasiness", some LE selling, and shortness of breath for the last several weeks. She saw Dr. Acie Fredrickson in the office on 11/27/2021. Patient last had cardiac catheterization done in February 2020 and results then showed LVEF 55-65% distal LAD with an 80% stenosis, and proximal RCA with an 85% stenosis. She was managed medically (put on Toprol XL 25). She presented today to undergo a cardiac catheterization as it was felt her symptoms were now an anginal equivalent. Cardiac catheterization done today showed 95% ostial LAD, 80% mid Diagonal, and codominant RCA with large bifurcating acute marginal 95%, and a proximal RCA 90-95%. At the time of my exam, patient is hypertensive (SBP 150's) but denies chest pain/discomfort, shortness of breath. She did, however, have complaints of chest discomfort while undergoing the cardiac catheterization.   Current Activity/ Functional Status: Patient is independent with mobility/ambulation, transfers, ADL's, IADL's.   Zubrod Score: At the time of surgery this patients most appropriate activity status/level should be described as: []     0    Normal activity, no symptoms [x]     1    Restricted in physical strenuous activity but ambulatory, able to do out light work []     2    Ambulatory and capable of self care, unable to do work activities, up  and about more than 50%  Of the time                            []     3    Only limited self care, in bed greater than 50% of waking hours []     4    Completely disabled, no self care, confined to bed or chair []     5    Moribund  Past Medical History:  Diagnosis Date   CAD (coronary artery disease)    Catheterization 2004, 60% distal LAD, 80% ostial circumflex( not optimal for PCI), 70% small RCA, medical therapy  /  nuclear June, 2005, EF 65%, no ischemia   Carotid artery disease ([place])    Doppler, November, 2011, no significant plaque, distal R. ICA velocities are elevated and could be source of bruit, 0-39% bilateral   Diabetes mellitus    Drug therapy    Intermittent steroid use   Dyslipidemia    Edema    Ejection fraction    EF 60%, echo, November, 2011, trivial pericardial effusion   Rheumatoid arthritis(714.0)    Hospitalization August, 2011, severe RA flare,    Statin intolerance     Past Surgical History:  Procedure Laterality Date   ABDOMINAL HYSTERECTOMY     CHOLECYSTECTOMY     LEFT HEART CATH AND CORONARY ANGIOGRAPHY N/A 01/10/2019   Procedure: LEFT HEART CATH AND CORONARY ANGIOGRAPHY;  Surgeon: Martinique, Peter M, MD;  Location: [facility];  Service: Cardiovascular;  Laterality: N/A;    Social History   Tobacco Use  Smoking Status Former   Types: Cigarettes   Quit date: 11/16/1994   Years since quitting: 27.0  Smokeless Tobacco Never    Social History   Substance and Sexual Activity  Alcohol Use No   Alcohol/week: 0.0 standard drinks     Allergies  Allergen Reactions   Cholesterol     All cholesterol meds cause pain and aching in joints   Hydrocodone Nausea Only   Statins     Joint pain   Metformin And Related     Joint pain    Current Facility-Administered Medications  Medication Dose Route Frequency Provider Last Rate Last Admin   0.9 %  sodium chloride infusion  250 mL Intravenous PRN Nahser, Wonda Cheng, MD 10 mL/hr at 12/02/21 1328 250 mL  at 12/02/21 1328   0.9 %  sodium chloride infusion   Intravenous Continuous Belva Crome, MD       0.9% sodium chloride infusion  1 mL/kg/hr Intravenous Continuous Nahser, Wonda Cheng, MD 66.8 mL/hr at 12/02/21 1229 1 mL/kg/hr at 12/02/21 1229   aspirin chewable tablet 81 mg  81 mg Oral Pre-Cath Leonie Man, MD       aspirin chewable tablet 81 mg  81 mg Oral Daily Belva Crome, MD       hydrALAZINE (APRESOLINE) injection 10 mg  10 mg Intravenous Q20 Min PRN Belva Crome, MD       labetalol (NORMODYNE) injection 10 mg  10 mg Intravenous Q10 min PRN Belva Crome, MD       nitroGLYCERIN 50 mg in dextrose 5 % 250 mL (0.2 mg/mL) infusion  10-100 mcg/min Intravenous Titrated Belva Crome, MD       ondansetron [facility]) injection 4 mg  4 mg Intravenous Q6H PRN Belva Crome, MD       sodium chloride flush (NS) 0.9 % injection 3 mL  3 mL Intravenous PRN Nahser, Wonda Cheng, MD        Facility-Administered Medications Prior to Admission  Medication Dose Route Frequency Provider Last Rate Last Admin   sodium chloride flush (NS) 0.9 % injection 3 mL  3 mL Intravenous Q12H Nahser, Wonda Cheng, MD       Medications Prior to Admission  Medication Sig Dispense Refill Last Dose   Abatacept 125 MG/ML SOSY Inject 1 Dose into the skin every 30 (thirty) days.   Past Month   aspirin 81 MG tablet Take 1 tablet (81 mg total) by mouth daily.   12/02/2021 at 0900   Cholecalciferol (VITAMIN D-3) 1000 units CAPS Take 1,000 Units by mouth daily.    [id number]   folic acid (FOLVITE) 1 MG tablet Take 1 mg by mouth every evening.    12/01/2021   furosemide (LASIX) 40 MG tablet Take 1 tablet (40 mg total) by mouth every Monday, Wednesday, and Friday. 39 tablet 3 12/01/2021   Insulin Lispro Prot & Lispro (HUMALOG 75/25 MIX) (75-25) 100 UNIT/ML Kwikpen Inject 3-5 Units into the skin See admin instructions. Inject 5 units in the morning and 3 units in the evening   12/01/2021   isosorbide mononitrate (IMDUR) 30 MG 24 hr  tablet Take 2 tablets (60 mg total) by mouth daily. 180 tablet 3 12/02/2021   losartan (COZAAR) 25 MG tablet Take 1 tablet (25 mg total) by mouth daily. 90 tablet 3 12/02/2021   methotrexate 250  MG/10ML injection Inject 10 mg into the muscle every Sunday.   Past Month   Multiple Vitamin (MULTIVITAMIN WITH MINERALS) TABS tablet Take 1 tablet by mouth daily. Centrum silver   12/01/2021   nitroGLYCERIN (NITROSTAT) 0.4 MG SL tablet PLACE 1 TABLET UNDER THE TONGUE EVERY 5 MINUTES AS NEEDED FOR CHEST PAIN 25 tablet 1 Past Week   pantoprazole (PROTONIX) 40 MG tablet Take 40 mg by mouth daily.   12/01/2021   potassium chloride (KLOR-CON) 10 MEQ tablet Take 1 tablet (10 mEq total) by mouth every Monday, Wednesday, and Friday. 39 tablet 3 12/01/2021   ALPRAZolam (XANAX) 0.25 MG tablet Take 0.25 mg by mouth daily as needed.   Unknown   B-D UF III MINI PEN NEEDLES 31G X 5 MM MISC USE WITH INSULIN TO INJECT BID UTD      ONETOUCH VERIO test strip USE 1 STRIP TID UTD       Family History  Problem Relation Age of Onset   Hypertension Mother    Diabetes Mother    Other Father   Mother died at age 67 and father died at age 67. Father had "stomach problems" from the medicine he used to treat his arthritis   Review of Systems:      Cardiac Review of Systems: Y or  [   N ]= no  Chest Pain [ N   ]  Resting SOB [ N  ] Exertional SOB  [ Y ]  Pedal Edema [  Y ]    Syncope  [place].Slicker  ]   Presyncope [ N  ]  General Review of Systems: [Y] = yes [ N ]=no Constitional:nausea [ N ]; night sweats [  N]; fever [ N ]; or chills [  N]                                                               Dental: Last Dentist visit: She has both upper and lower dentures and so has not been to the dentist in years  Eye : Amaurosis fugax[ N ]; Resp: cough [ N ];  wheezing[  N];  hemoptysis[ N ];  GI:  vomiting[ N ];  melena[ N ];  hematochezia [ N ];  GU: hematuria[ N ];                Skin: swelling[ N ];, or itching[ N ]; discoloration  lower extremities is chronic Musculosketetal:  joint pain[ Y ];   Heme/Lymph: bruising[  ];  bleeding[  ];  anemia[ N ];  Neuro: TIA[ N ];    stroke[ N ];  vertigo[  ];  seizures[ N ];     Psych:anxiety[ Y ];  Endocrine: diabetes[ Y ];              She has had COVID vaccine, but not booster     Physical Exam: BP (!) 143/61    Pulse 67    Temp 98.1 F (36.7 C) (Oral)    Resp 19    Ht 5\' 2"  (1.575 m)    Wt 66.7 kg    SpO2 100%    BMI 26.89 kg/m    General appearance: alert, cooperative, and no distress Head: Normocephalic, without obvious abnormality, atraumatic Neck: no carotid bruit,  no JVD, and supple, symmetrical, trachea midline Resp: clear to auscultation bilaterally Cardio: RRR, no murmur GI: Soft, non tender, bowel sounds present Extremities: Trace LE edema. Palpable DP bilaterally. Discoloration (? Venous stasis changes) lower extremities Neurologic: Grossly normal  Diagnostic Studies & Laboratory data:     Recent Radiology Findings:   CARDIAC CATHETERIZATION  Addendum Date:    CONCLUSIONS: 50% distal left main.  95% ostial LAD with TIMI-3 flow. 80% mid diagonal #2.  95% apical LAD. 30 to 50% proximal circumflex Codominant right coronary with large bifurcating acute marginal 95% and also 90 to 95% proximal RCA. Normal LV function.  Normal LVEDP. Prolonged pain after completing left coronary angiogram requiring IV nitroglycerin with up titration, IV beta-blockade, and intracoronary nitroglycerin to relieve. RECOMMENDATIONS: Admission IV nitroglycerin and IV heparin TCTS consultation for consideration of multivessel bypass.  Result Date: 12/02/2021 CONCLUSIONS: 50% distal left main.  95% ostial LAD with TIMI-3 flow. 80% mid diagonal #2.  95% apical LAD. 30 to 50% proximal circumflex Codominant right coronary with large bifurcating acute marginal 95% and also 90 to 95% proximal RCA. Normal LV function.  Normal LVEDP. Prolonged pain after completing left coronary angiogram  requiring IV nitroglycerin with up titration, IV beta-blockade, and intracoronary nitroglycerin to relieve. RECOMMENDATIONS: Admission IV nitroglycerin and IV heparin TCTS consultation for consideration of multivessel bypass.    Diagnostic Dominance: Co-dominant Intervention  I have independently reviewed the above radiologic studies and discussed with the patient   Recent Lab Findings: Lab Results  Component Value Date   WBC 5.3 11/27/2021   HGB 11.7 11/27/2021   HCT 33.6 (L) 11/27/2021   PLT 202 11/27/2021   GLUCOSE 120 (H) 11/27/2021   CHOL 170 11/27/2021   TRIG 37 11/27/2021   HDL 73 11/27/2021   LDLCALC 89 11/27/2021   ALT 15 11/27/2021   AST 24 11/27/2021   NA 140 11/27/2021   K 4.6 11/27/2021   CL 105 11/27/2021   CREATININE 0.88 11/27/2021   BUN 10 11/27/2021   CO2 25 11/27/2021   TSH 0.811 07/16/2010   INR 1.11 07/16/2010   HGBA1C (H) 07/16/2010    8.3 (NOTE)                                                                       According to the ADA Clinical Practice Recommendations for 2011, when HbA1c is used as a screening test:   >=6.5%   Diagnostic of Diabetes Mellitus           (if abnormal result  is confirmed)  5.7-6.4%   Increased risk of developing Diabetes Mellitus  References:Diagnosis and Classification of Diabetes Mellitus,Diabetes [id number] 1):S62-S69 and Standards of Medical Care in         Diabetes - 2011,Diabetes Care,2011,34  (Suppl 1):S11-S61.   Assessment / Plan:   Coronary artery disease-On Nitro drip and will be on Heparin drip. Dr. Kipp Brood to review films, and echo (once it has been done) and determine if coronary artery bypass grafting surgery is the best option to treat CAD. 2. History of diabetes mellitus-on Insulin. Will check HGA1C 3. History of dyslipidemia-statin intolerant 4. History of rheumatoid arthritis-on Methotrexate and Abatacept (previously) prior to admission 5. History of remote tobacco abuse 6. History  of  anxiety-on Xanax 0.25 mg daily prior to admission Of note, patient spent Sunday with her daughter who tested positive for COVID . Patient's test apparently negative but will have to retest at some point.  I  spent 15 minutes counseling the patient face to face.   Lars Pinks PA-C 12/02/2021 2:15 PM    Agree with above. 81 year old female with severe three-vessel coronary artery disease.  She has preserved biventricular function and no significant valvular disease.  She is agreeable to proceed with surgical revascularization.  She is scheduled for 12/08/2021

## 2021-12-02 NOTE — Progress Notes (Signed)
Pre-CABG study completed.  ° °Please see CV Proc for preliminary results.  ° °Shakeyla Giebler, RDMS, RVT ° °

## 2021-12-02 NOTE — CV Procedure (Signed)
50-60% distal left main 95% ostial LAD, and 80 to 90% apical LAD.  Mid diagonal number 275 to 80% stenosis before bifurcation. 50% ostial circumflex RCA with severe ostial to proximal RCA and mid bifurcating acute marginal. Normal LV function, EF 60, and LVEDP 8 mmHg.

## 2021-12-02 NOTE — Progress Notes (Addendum)
ANTICOAGULATION CONSULT NOTE - Initial Consult  Pharmacy Consult for IV Heparin Indication: chest pain/ACS - start 8 hours after sheath removal  Allergies  Allergen Reactions   Cholesterol     All cholesterol meds cause pain and aching in joints   Hydrocodone Nausea Only   Statins     Joint pain   Metformin And Related     Joint pain    Patient Measurements: Height: 5\' 2"  (157.5 cm) Weight: 66.7 kg (147 lb) IBW/kg (Calculated) : 50.1 Heparin Dosing Weight: 63.8 kg  Vital Signs: Temp: 98.1 F (36.7 C) (01/17 1039) Temp Source: Oral (01/17 1039) BP: 143/61 (01/17 1410) Pulse Rate: 67 (01/17 1410)  Labs: No results for input(s): HGB, HCT, PLT, APTT, LABPROT, INR, HEPARINUNFRC, HEPRLOWMOCWT, CREATININE, CKTOTAL, CKMB, TROPONINIHS in the last 72 hours.  Estimated Creatinine Clearance: 45.6 mL/min (by C-G formula based on SCr of 0.88 mg/dL).   Medical History: Past Medical History:  Diagnosis Date   CAD (coronary artery disease)    Catheterization 2004, 60% distal LAD, 80% ostial circumflex( not optimal for PCI), 70% small RCA, medical therapy  /  nuclear June, 2005, EF 65%, no ischemia   Carotid artery disease ([place])    Doppler, November, 2011, no significant plaque, distal R. ICA velocities are elevated and could be source of bruit, 0-39% bilateral   Diabetes mellitus    Drug therapy    Intermittent steroid use   Dyslipidemia    Edema    Ejection fraction    EF 60%, echo, November, 2011, trivial pericardial effusion   Rheumatoid arthritis(714.0)    Hospitalization August, 2011, severe RA flare,    Statin intolerance     Medications:  Scheduled:   aspirin  81 mg Oral Pre-Cath   aspirin  81 mg Oral Daily   Infusions:   sodium chloride 250 mL (12/02/21 1328)   sodium chloride     sodium chloride 1 mL/kg/hr (12/02/21 1229)   nitroGLYCERIN      Assessment: 81 years of age female status post catheterization with multivessel disease and TCTS consultation for  consideration of bypass. Pharmacy consulted to start IV Heparin. Sheath removed at 13:46 PM. TR band removed at 18:28 PM.  Goal of Therapy:  Heparin level 0.3-0.7 units/ml Monitor platelets by anticoagulation protocol: Yes   Plan:  Start IV Heparin at 2200 PM at rate of units/hr. Heparin level in 8 hours.  Daily Heparin level and CBC while on therapy.   Sloan Leiter, PharmD, BCPS, BCCCP Clinical Pharmacist Please refer to [facility] for [place] numbers 12/02/2021,2:48 PM

## 2021-12-02 NOTE — H&P (Signed)
Cath Lab Visit (complete for each Cath Lab visit)  Clinical Evaluation Leading to the Procedure:   ACS: No.  Non-ACS:    Anginal Classification: CCS III  Anti-ischemic medical therapy: Minimal Therapy (1 class of medications)  Non-Invasive Test Results: No non-invasive testing performed  Prior CABG: No previous CABG    The patient was counseled to undergo left heart catheterization, coronary angiography, and possible percutaneous coronary intervention with stent implantation. The procedural risks and benefits were discussed in detail. The risks discussed included death, stroke, myocardial infarction, life-threatening bleeding, limb ischemia, kidney injury, allergy, and possible emergency cardiac surgery. The risk of these significant complications were estimated to occur less than 1% of the time. After discussion, the patient has agreed to proceed.   Please see note by Dr. Acie Fredrickson

## 2021-12-02 NOTE — Progress Notes (Signed)
Echocardiogram 2D Echocardiogram has been performed.  Arlyss Gandy 12/02/2021, 3:32 PM

## 2021-12-02 NOTE — Progress Notes (Addendum)
TR BAND REMOVAL  LOCATION:    Right radial  DEFLATED PER PROTOCOL:    Yes.    TIME BAND OFF / DRESSING APPLIED:    1815pm A clean dry dressing applied with gauze and tegaderm.   SITE UPON ARRIVAL:    Level 0  SITE AFTER BAND REMOVAL:    Level 0  CIRCULATION SENSATION AND MOVEMENT:    Within Normal Limits   Yes.    COMMENTS:   Care instruction given to patient.

## 2021-12-03 ENCOUNTER — Encounter (HOSPITAL_COMMUNITY): Payer: Self-pay | Admitting: Interventional Cardiology

## 2021-12-03 DIAGNOSIS — I25110 Atherosclerotic heart disease of native coronary artery with unstable angina pectoris: Secondary | ICD-10-CM

## 2021-12-03 DIAGNOSIS — I25119 Atherosclerotic heart disease of native coronary artery with unspecified angina pectoris: Secondary | ICD-10-CM

## 2021-12-03 DIAGNOSIS — I200 Unstable angina: Secondary | ICD-10-CM

## 2021-12-03 LAB — CBC
HCT: 41.3 % (ref 36.0–46.0)
Hemoglobin: 13.6 g/dL (ref 12.0–15.0)
MCH: 30.7 pg (ref 26.0–34.0)
MCHC: 32.9 g/dL (ref 30.0–36.0)
MCV: 93.2 fL (ref 80.0–100.0)
Platelets: 199 10*3/uL (ref 150–400)
RBC: 4.43 MIL/uL (ref 3.87–5.11)
RDW: 15.1 % (ref 11.5–15.5)
WBC: 4.4 10*3/uL (ref 4.0–10.5)
nRBC: 0 % (ref 0.0–0.2)

## 2021-12-03 LAB — GLUCOSE, CAPILLARY
Glucose-Capillary: 91 mg/dL (ref 70–99)
Glucose-Capillary: 170 mg/dL — ABNORMAL HIGH (ref 70–99)
Glucose-Capillary: 150 mg/dL — ABNORMAL HIGH (ref 70–99)
Glucose-Capillary: 125 mg/dL — ABNORMAL HIGH (ref 70–99)

## 2021-12-03 LAB — HEPARIN LEVEL (UNFRACTIONATED)
Heparin Unfractionated: <0.1 IU/mL — ABNORMAL LOW (ref 0.30–0.70)
Heparin Unfractionated: 0.14 IU/mL — ABNORMAL LOW (ref 0.30–0.70)

## 2021-12-03 LAB — HEMOGLOBIN A1C
Hgb A1c MFr Bld: 7.3 % — ABNORMAL HIGH (ref 4.8–5.6)
Mean Plasma Glucose: 162.81 mg/dL

## 2021-12-03 MED ORDER — HYDRALAZINE HCL 20 MG/ML IJ SOLN: 10.0000 mg | Freq: Four times a day (QID) | INTRAMUSCULAR | Status: DC | PRN

## 2021-12-03 MED ORDER — METOPROLOL TARTRATE 12.5 MG HALF TABLET
12.5000 mg | ORAL_TABLET | Freq: Two times a day (BID) | ORAL | Status: DC
  Administered 2021-12-03 – 2021-12-07 (×9): 12.5 mg via ORAL
  Filled 2021-12-03 (×9): qty 1

## 2021-12-03 MED ORDER — INSULIN ASPART 100 UNIT/ML IJ SOLN: 0.0000 [IU] | Freq: Every day | INTRAMUSCULAR | Status: DC

## 2021-12-03 MED ORDER — CHLORHEXIDINE GLUCONATE CLOTH 2 % EX PADS
6.0000 | MEDICATED_PAD | Freq: Every day | CUTANEOUS | Status: DC
  Administered 2021-12-03 – 2021-12-08 (×6): 6 via TOPICAL

## 2021-12-03 MED ORDER — INSULIN ASPART 100 UNIT/ML IJ SOLN
0.0000 [IU] | Freq: Three times a day (TID) | INTRAMUSCULAR | Status: DC
  Administered 2021-12-04 – 2021-12-05 (×2): 2 [IU] via SUBCUTANEOUS
  Administered 2021-12-05: 1 [IU] via SUBCUTANEOUS
  Administered 2021-12-06: 2 [IU] via SUBCUTANEOUS
  Administered 2021-12-07: 1 [IU] via SUBCUTANEOUS
  Administered 2021-12-07: 2 [IU] via SUBCUTANEOUS

## 2021-12-03 MED FILL — Nitroglycerin IV Soln 100 MCG/ML in D5W: INTRA_ARTERIAL | Qty: 10 | Status: AC

## 2021-12-03 NOTE — Progress Notes (Signed)
Spoke with pt and gave her [facility] booklet however it does not sound like surgery is confirmed. She is eating but wants to walk after lunch. [phone] [name] [name] CES, ACSM 11:40 AM 12/03/2021

## 2021-12-03 NOTE — Progress Notes (Signed)
ANTICOAGULATION CONSULT NOTE - Initial Consult  Pharmacy Consult for IV Heparin Indication: chest pain/ACS  Allergies  Allergen Reactions   Cholesterol     All cholesterol meds cause pain and aching in joints   Hydrocodone Nausea Only   Statins     Joint pain   Metformin And Related     Joint pain    Patient Measurements: Height: 5\' 2"  (157.5 cm) Weight: 66.7 kg (147 lb) IBW/kg (Calculated) : 50.1 Heparin Dosing Weight: 63.8 kg  Vital Signs: Temp: 98.6 F (37 C) (01/18 0905) Temp Source: Oral (01/18 0905) BP: 145/92 (01/18 0905) Pulse Rate: 62 (01/18 0905)  Labs: Recent Labs    12/03/21 0756  HGB 13.6  HCT 41.3  PLT 199  HEPARINUNFRC <0.10*    Estimated Creatinine Clearance: 45.6 mL/min (by C-G formula based on SCr of 0.88 mg/dL).   Medical History: Past Medical History:  Diagnosis Date   CAD (coronary artery disease)    Catheterization 2004, 60% distal LAD, 80% ostial circumflex( not optimal for PCI), 70% small RCA, medical therapy  /  nuclear June, 2005, EF 65%, no ischemia   Carotid artery disease ([facility])    Doppler, November, 2011, no significant plaque, distal R. ICA velocities are elevated and could be source of bruit, 0-39% bilateral   Diabetes mellitus    Drug therapy    Intermittent steroid use   Dyslipidemia    Edema    Ejection fraction    EF 60%, echo, November, 2011, trivial pericardial effusion   Rheumatoid arthritis(714.0)    Hospitalization August, 2011, severe RA flare,    Statin intolerance     Medications:  Scheduled:   aspirin  81 mg Oral Daily   Chlorhexidine Gluconate Cloth  6 each Topical Daily   folic acid  1 mg Oral QPM   losartan  25 mg Oral Daily   metoprolol tartrate  12.5 mg Oral BID   sodium chloride flush  3 mL Intravenous Q12H   Infusions:   sodium chloride Stopped (12/03/21 0737)   heparin 750 Units/hr (12/03/21 0800)   nitroGLYCERIN      Assessment: 81 years of age female status post catheterization with  multivessel disease and TCTS consultation for consideration of bypass. Sheath removed at 13:46 PM. TR band removed at 18:28 PM on 1/17. Heparin level came back subtherapeutic (< 0.10) on 750 units/hr 8 hours after initiation. No issues with heparin administration per nurse. Hgb 13.6, plts 199, no s/sx of bleeding.  Goal of Therapy:  Heparin level 0.3-0.7 units/ml Monitor platelets by anticoagulation protocol: Yes   Plan:  Increase IV Heparin to 950 units/hr Check heparin level in 8 hours.  Daily Heparin level and CBC while on therapy.   Jennefer Bravo, Student Pharmacist 12/03/2021,9:55 AM

## 2021-12-03 NOTE — Care Management (Signed)
°  Transition of Care [facility]) Screening Note   Patient Details  Name: [name] [name] Date of Birth: [birth year]   [facility]) CM/SW Contact:    Gala Lewandowsky, RN Phone Number: 12/03/2021, 4:42 PM    Transition of Care Department [facility]) has reviewed patient and no TOC needs have been identified at this time. We will continue to monitor patient advancement through interdisciplinary progression rounds. If new patient transition needs arise, please place a TOC consult.

## 2021-12-03 NOTE — Progress Notes (Signed)
Progress Note  Patient Name: [name] [name] Date of Encounter: 12/03/2021  [facility] Cardiologist: Mertie Moores, MD   Subjective   Patient had cardiac catheterization yesterday by Dr. Tamala Julian revealing left main/three-vessel disease.  She is currently pain-free on IV heparin scheduled for CABG in the near future.  Inpatient Medications    Scheduled Meds:  aspirin  81 mg Oral Daily   Chlorhexidine Gluconate Cloth  6 each Topical Daily   folic acid  1 mg Oral QPM   losartan  25 mg Oral Daily   metoprolol tartrate  12.5 mg Oral BID   sodium chloride flush  3 mL Intravenous Q12H   Continuous Infusions:  sodium chloride Stopped (12/03/21 0737)   heparin 750 Units/hr (12/03/21 0800)   nitroGLYCERIN     PRN Meds: sodium chloride, acetaminophen, nitroGLYCERIN, ondansetron (ZOFRAN) IV, sodium chloride flush   Vital Signs    Vitals:   12/03/21 0640 12/03/21 0700 12/03/21 0800 12/03/21 0905  BP:  (!) 163/83  (!) 145/92  Pulse:  64 (!) 57 62  Resp:  14 14 17   Temp: 98.4 F (36.9 C)   98.6 F (37 C)  TempSrc: Oral   Oral  SpO2:  100% 100% 100%  Weight:      Height:        Intake/Output Summary (Last 24 hours) at 12/03/2021 0956 Last data filed at 12/03/2021 0800 Gross per 24 hour  Intake 321.8 ml  Output 500 ml  Net -178.2 ml   Last 3 Weights 12/02/2021 11/27/2021 06/26/2020  Weight (lbs) 147 lb 147 lb 3.2 oz 154 lb  Weight (kg) 66.679 kg 66.769 kg 69.854 kg      Telemetry    Sinus rhythm- Personally Reviewed  ECG    Not performed today- Personally Reviewed  Physical Exam   GEN: No acute distress.   Neck: No JVD Cardiac: RRR, no murmurs, rubs, or gallops.  Respiratory: Clear to auscultation bilaterally. GI: Soft, nontender, non-distended  MS: No edema; No deformity. Neuro:  Nonfocal  Psych: Normal affect   Labs    High Sensitivity Troponin:  No results for input(s): TROPONINIHS in the last 720 hours.   Chemistry Recent Labs  Lab 11/27/21 1120   NA 140  K 4.6  CL 105  CO2 25  GLUCOSE 120*  BUN 10  CREATININE 0.88  CALCIUM 9.5  PROT 6.9  ALBUMIN 3.9  AST 24  ALT 15  ALKPHOS 70  BILITOT 0.7    Lipids  Recent Labs  Lab 11/27/21 1120  CHOL 170  TRIG 37  HDL 73  LABVLDL 8  LDLCALC 89  CHOLHDL 2.3    Hematology Recent Labs  Lab 11/27/21 1120 12/03/21 0756  WBC 5.3 4.4  RBC 3.87 4.43  HGB 11.7 13.6  HCT 33.6* 41.3  MCV 87 93.2  MCH 30.2 30.7  MCHC 34.8 32.9  RDW 13.7 15.1  PLT 202 199   Thyroid No results for input(s): TSH, FREET4 in the last 168 hours.  BNPNo results for input(s): BNP, PROBNP in the last 168 hours.  DDimer No results for input(s): DDIMER in the last 168 hours.   Radiology    CARDIAC CATHETERIZATION  Addendum Date: 12/02/2021   CONCLUSIONS: 50% distal left main.  95% ostial LAD with TIMI-3 flow. 80% mid diagonal #2.  95% apical LAD. 30 to 50% proximal circumflex Codominant right coronary with large bifurcating acute marginal 95% and also 90 to 95% proximal RCA. Normal LV function.  Normal LVEDP.  Prolonged pain after completing left coronary angiogram requiring IV nitroglycerin with up titration, IV beta-blockade, and intracoronary nitroglycerin to relieve. RECOMMENDATIONS: Admission IV nitroglycerin and IV heparin TCTS consultation for consideration of multivessel bypass.  Result Date: 12/02/2021 CONCLUSIONS: 50% distal left main.  95% ostial LAD with TIMI-3 flow. 80% mid diagonal #2.  95% apical LAD. 30 to 50% proximal circumflex Codominant right coronary with large bifurcating acute marginal 95% and also 90 to 95% proximal RCA. Normal LV function.  Normal LVEDP. Prolonged pain after completing left coronary angiogram requiring IV nitroglycerin with up titration, IV beta-blockade, and intracoronary nitroglycerin to relieve. RECOMMENDATIONS: Admission IV nitroglycerin and IV heparin TCTS consultation for consideration of multivessel bypass.   ECHOCARDIOGRAM COMPLETE  Result Date: 12/02/2021     ECHOCARDIOGRAM REPORT   Patient Name:   [name] [name] [facility] Date of Exam: 12/02/2021 Medical Rec #:  [id number]       Height:       62.0 in Accession #:    [id number]      Weight:       147.0 lb Date of Birth:  05-Jul-1941       BSA:          1.677 m Patient Age:    45 years        BP:           143/61 mmHg Patient Gender: F               HR:           67 bpm. Exam Location:  Inpatient Procedure: 2D Echo Indications:    CAD  History:        Patient has prior history of Echocardiogram examinations, most                 recent 05/08/2015. CAD; Risk Factors:Diabetes and Dyslipidemia.  Sonographer:    Arlyss Gandy Referring Phys: [place]  1. Left ventricular ejection fraction, by estimation, is 60 to 65%. The left ventricle has normal function. The left ventricle has no regional wall motion abnormalities. Left ventricular diastolic parameters were normal.  2. Right ventricular systolic function is normal. The right ventricular size is normal. There is normal pulmonary artery systolic pressure. The estimated right ventricular systolic pressure is 123XX123 mmHg.  3. The mitral valve is grossly normal. Trivial mitral valve regurgitation. No evidence of mitral stenosis.  4. The aortic valve is tricuspid. Aortic valve regurgitation is not visualized. No aortic stenosis is present.  5. The inferior vena cava is dilated in size with >50% respiratory variability, suggesting right atrial pressure of 8 mmHg. FINDINGS  Left Ventricle: Left ventricular ejection fraction, by estimation, is 60 to 65%. The left ventricle has normal function. The left ventricle has no regional wall motion abnormalities. The left ventricular internal cavity size was normal in size. There is  no left ventricular hypertrophy. Left ventricular diastolic parameters were normal. Right Ventricle: The right ventricular size is normal. No increase in right ventricular wall thickness. Right ventricular systolic function is normal. There is normal  pulmonary artery systolic pressure. The tricuspid regurgitant velocity is 1.97 m/s, and  with an assumed right atrial pressure of 8 mmHg, the estimated right ventricular systolic pressure is 123XX123 mmHg. Left Atrium: Left atrial size was normal in size. Right Atrium: Right atrial size was normal in size. Pericardium: Trivial pericardial effusion is present. Mitral Valve: The mitral valve is grossly normal. Trivial mitral valve regurgitation. No evidence of mitral valve stenosis.  Tricuspid Valve: The tricuspid valve is grossly normal. Tricuspid valve regurgitation is trivial. No evidence of tricuspid stenosis. Aortic Valve: The aortic valve is tricuspid. Aortic valve regurgitation is not visualized. No aortic stenosis is present. Aortic valve mean gradient measures 6.0 mmHg. Aortic valve peak gradient measures 11.3 mmHg. Aortic valve area, by VTI measures 2.13  cm. Pulmonic Valve: The pulmonic valve was grossly normal. Pulmonic valve regurgitation is trivial. No evidence of pulmonic stenosis. Aorta: The aortic root and ascending aorta are structurally normal, with no evidence of dilitation. Venous: The inferior vena cava is dilated in size with greater than 50% respiratory variability, suggesting right atrial pressure of 8 mmHg. IAS/Shunts: The atrial septum is grossly normal.  LEFT VENTRICLE PLAX 2D LVIDd:         4.10 cm   Diastology LVIDs:         2.90 cm   LV e' medial:    7.83 cm/s LV PW:         0.90 cm   LV E/e' medial:  11.4 LV IVS:        0.90 cm   LV e' lateral:   9.46 cm/s LVOT diam:     1.90 cm   LV E/e' lateral: 9.5 LV SV:         84 LV SV Index:   50 LVOT Area:     2.84 cm  RIGHT VENTRICLE             IVC RV Basal diam:  3.80 cm     IVC diam: 2.20 cm RV Mid diam:    2.70 cm RV S prime:     11.40 cm/s TAPSE (M-mode): 2.3 cm LEFT ATRIUM             Index LA Vol (A2C):   49.9 ml 29.75 ml/m LA Vol (A4C):   35.6 ml 21.23 ml/m LA Biplane Vol: 42.1 ml 25.10 ml/m  AORTIC VALVE AV Area (Vmax):    2.09 cm AV  Area (Vmean):   2.12 cm AV Area (VTI):     2.13 cm AV Vmax:           168.00 cm/s AV Vmean:          108.000 cm/s AV VTI:            0.396 m AV Peak Grad:      11.3 mmHg AV Mean Grad:      6.0 mmHg LVOT Vmax:         124.00 cm/s LVOT Vmean:        80.800 cm/s LVOT VTI:          0.297 m LVOT/AV VTI ratio: 0.75  AORTA Ao Root diam: 2.70 cm Ao Asc diam:  2.80 cm MITRAL VALVE               TRICUSPID VALVE MV Area (PHT): 3.65 cm    TR Peak grad:   15.5 mmHg MV Decel Time: 208 msec    TR Vmax:        197.00 cm/s MV E velocity: 89.50 cm/s MV A velocity: 96.80 cm/s  SHUNTS MV E/A ratio:  0.92        Systemic VTI:  0.30 m                            Systemic Diam: 1.90 cm Lennie Odor MD Electronically signed by Lennie Odor MD Signature Date/Time: 12/02/2021/4:06:38 PM  Final    VAS US DOPPLER PRE CABG  Result Date: 12/03/2021 PREOPERATIVE VASCULAR EVALUATION Patient Name:  [name] [name]  Date of Exam:   12/02/2021 Medical Rec #: [id number]        Accession #:    [id number] Date of Birth: 1941/04/17        Patient Gender: F Patient Age:   57 years Exam Location:  [facility] Procedure:      VAS US DOPPLER PRE CABG Referring Phys: Collier Salina VANTRIGT --------------------------------------------------------------------------------  Indications:      Pre-CABG. Risk Factors:     Hyperlipidemia, Diabetes, past history of smoking, coronary                   artery disease. Limitations:      Patient intolerant to cuffs, lower extremity pain Comparison Study: No prior studies. Performing Technologist: Rogelia Rohrer RVT, RDMS Supporting Technologist: Darlin Coco RDMS, RVT  Examination Guidelines: A complete evaluation includes B-mode imaging, spectral Doppler, color Doppler, and power Doppler as needed of all accessible portions of each vessel. Bilateral testing is considered an integral part of a complete examination. Limited examinations for reoccurring indications may be performed as noted.  Right Carotid Findings:  +----------+--------+--------+--------+--------+--------+             PSV cm/s EDV cm/s Stenosis Describe Comments  +----------+--------+--------+--------+--------+--------+  CCA Prox   78       14                                   +----------+--------+--------+--------+--------+--------+  CCA Distal 65       16                                   +----------+--------+--------+--------+--------+--------+  ICA Prox   65       16                                   +----------+--------+--------+--------+--------+--------+  ICA Distal 98       33                                   +----------+--------+--------+--------+--------+--------+  ECA        59       7                                    +----------+--------+--------+--------+--------+--------+ +----------+--------+-------+---------+------------+             PSV cm/s EDV cms Describe  Arm Pressure  +----------+--------+-------+---------+------------+  Subclavian 131              Turbulent               +----------+--------+-------+---------+------------+ +---------+--------+--+--------+--+---------+  Vertebral PSV cm/s 58 EDV cm/s 15 Antegrade  +---------+--------+--+--------+--+---------+ Left Carotid Findings: +----------+--------+--------+--------+-----------------+------------------+             PSV cm/s EDV cm/s Stenosis Describe          Comments            +----------+--------+--------+--------+-----------------+------------------+  CCA Prox   84       17  intimal thickening  +----------+--------+--------+--------+-----------------+------------------+  CCA Distal 80       16                                  intimal thickening  +----------+--------+--------+--------+-----------------+------------------+  ICA Prox   51       14       1-39%    heterogenous mild                     +----------+--------+--------+--------+-----------------+------------------+  ICA Distal 60       18                                                       +----------+--------+--------+--------+-----------------+------------------+  ECA        87       13                                                      +----------+--------+--------+--------+-----------------+------------------+ +----------+--------+--------+----------------+------------+  Subclavian PSV cm/s EDV cm/s Describe         Arm Pressure  +----------+--------+--------+----------------+------------+             132               Multiphasic, WNL               +----------+--------+--------+----------------+------------+ +---------+--------+--+--------+--+---------+  Vertebral PSV cm/s 45 EDV cm/s 14 Antegrade  +---------+--------+--+--------+--+---------+  ABI Findings: +---------+------------------+-----+---------+--------+  Right     Rt Pressure (mmHg) Index Waveform  Comment   +---------+------------------+-----+---------+--------+  Brachial  152                      triphasic           +---------+------------------+-----+---------+--------+  PTA       148                0.97  triphasic           +---------+------------------+-----+---------+--------+  DP        138                0.91  triphasic           +---------+------------------+-----+---------+--------+  Great Toe 121                0.80  Normal              +---------+------------------+-----+---------+--------+ +---------+------------------+-----+---------+---------------------------------+  Left      Lt Pressure (mmHg) Index Waveform  Comment                            +---------+------------------+-----+---------+---------------------------------+  Brachial  136                      triphasic                                    +---------+------------------+-----+---------+---------------------------------+  PTA  triphasic Unable to obtain pressure due to                                                 patient pain                        +---------+------------------+-----+---------+---------------------------------+  DP        173                1.14  triphasic                                    +---------+------------------+-----+---------+---------------------------------+  Great Toe 128                0.84  Normal                                       +---------+------------------+-----+---------+---------------------------------+  Right Doppler Findings: +--------+--------+-----+---------+--------+  Site     Pressure Index Doppler   Comments  +--------+--------+-----+---------+--------+  Brachial 152            triphasic           +--------+--------+-----+---------+--------+  Radial                  triphasic           +--------+--------+-----+---------+--------+  Ulnar                   triphasic           +--------+--------+-----+---------+--------+  Left Doppler Findings: +--------+--------+-----+---------+--------+  Site     Pressure Index Doppler   Comments  +--------+--------+-----+---------+--------+  Brachial 136            triphasic           +--------+--------+-----+---------+--------+  Radial                  triphasic           +--------+--------+-----+---------+--------+  Ulnar                   triphasic           +--------+--------+-----+---------+--------+  Summary: Right Carotid: The extracranial vessels were near-normal with only minimal wall                thickening or plaque. Left Carotid: Velocities in the left ICA are consistent with a 1-39% stenosis. Vertebrals:  Bilateral vertebral arteries demonstrate antegrade flow. Subclavians: Right subclavian artery flow was disturbed. Normal flow              hemodynamics were seen in the left subclavian artery. Right ABI: Resting right ankle-brachial index is within normal range. No evidence of significant right lower extremity arterial disease. Left ABI: Resting left ankle-brachial index is within normal range. No evidence of significant left lower extremity arterial disease.  Right Upper Extremity: Doppler waveform obliterate with right radial compression. Doppler waveforms remain within normal limits with right ulnar compression. Left Upper Extremity: Doppler waveform obliterate with left radial compression. Doppler waveforms remain within normal limits with left ulnar compression.  Electronically signed by Deitra Mayo MD on 12/03/2021 at 6:20:48 AM.    Final  Cardiac Studies   2D echocardiogram (12/03/2021)   1. Left ventricular ejection fraction, by estimation, is 60 to 65%. The  left ventricle has normal function. The left ventricle has no regional  wall motion abnormalities. Left ventricular diastolic parameters were  normal.   2. Right ventricular systolic function is normal. The right ventricular  size is normal. There is normal pulmonary artery systolic pressure. The  estimated right ventricular systolic pressure is 123XX123 mmHg.   3. The mitral valve is grossly normal. Trivial mitral valve  regurgitation. No evidence of mitral stenosis.   4. The aortic valve is tricuspid. Aortic valve regurgitation is not  visualized. No aortic stenosis is present.   5. The inferior vena cava is dilated in size with >50% respiratory  variability, suggesting right atrial pressure of 8 mmHg.    Cardiac catheterization (12/02/2021)  CONCLUSIONS: 50% distal left main.   95% ostial LAD with TIMI-3 flow. 80% mid diagonal #2.  95% apical LAD. 30 to 50% proximal circumflex Codominant right coronary with large bifurcating acute marginal 95% and also 90 to 95% proximal RCA. Normal LV function.  Normal LVEDP. Prolonged pain after completing left coronary angiogram requiring IV nitroglycerin with up titration, IV beta-blockade, and intracoronary nitroglycerin to relieve.   RECOMMENDATIONS:   Admission IV nitroglycerin and IV heparin TCTS consultation for consideration of multivessel bypass.  Coronary Diagrams  Diagnostic Dominance:  Co-dominant Intervention  Patient Profile     81 y.o. female patient of Dr. Elmarie Shiley with remote history of [place], hyperlipidemia and carotid disease who was seen recently by him 11/27/2021 with progressive chest pain which is nitrate responsive and was referred for diagnostic coronary angiography.  Assessment & Plan    1: CAD-cath yesterday revealed left main/three-vessel disease with preserved LV function.  She was seen by Dr. Kipp Brood this morning in anticipation of CABG.  Her LV function is normal.  She is on IV heparin and is pain-free.  2: Hyperlipidemia-statin intolerant    For questions or updates, please contact [place] Please consult [email or web address] for contact info under        Signed, Quay Burow, MD  12/03/2021, 9:56 AM

## 2021-12-03 NOTE — Plan of Care (Signed)
  Problem: Education: Goal: Knowledge of General Education information will improve Description: Including pain rating scale, medication(s)/side effects and non-pharmacologic comfort measures Outcome: Progressing   Problem: Health Behavior/Discharge Planning: Goal: Ability to manage health-related needs will improve Outcome: Progressing   Problem: Clinical Measurements: Goal: Ability to maintain clinical measurements within normal limits will improve Outcome: Progressing Goal: Will remain free from infection Outcome: Progressing Goal: Diagnostic test results will improve Outcome: Progressing Goal: Respiratory complications will improve Outcome: Progressing Goal: Cardiovascular complication will be avoided Outcome: Progressing   Problem: Activity: Goal: Risk for activity intolerance will decrease Outcome: Progressing   Problem: Nutrition: Goal: Adequate nutrition will be maintained Outcome: Progressing   Problem: Coping: Goal: Level of anxiety will decrease Outcome: Progressing   Problem: Elimination: Goal: Will not experience complications related to bowel motility Outcome: Progressing Goal: Will not experience complications related to urinary retention Outcome: Progressing   Problem: Pain Managment: Goal: General experience of comfort will improve Outcome: Progressing   Problem: Safety: Goal: Ability to remain free from injury will improve Outcome: Progressing   Problem: Skin Integrity: Goal: Risk for impaired skin integrity will decrease Outcome: Progressing   Problem: Education: Goal: Understanding of CV disease, CV risk reduction, and recovery process will improve Outcome: Progressing   Problem: Activity: Goal: Ability to return to baseline activity level will improve Outcome: Progressing   Problem: Cardiovascular: Goal: Ability to achieve and maintain adequate cardiovascular perfusion will improve Outcome: Progressing Goal: Vascular access site(s)  Level 0-1 will be maintained Outcome: Progressing   Problem: Health Behavior/Discharge Planning: Goal: Ability to safely manage health-related needs after discharge will improve Outcome: Progressing   

## 2021-12-03 NOTE — Progress Notes (Signed)
ANTICOAGULATION CONSULT NOTE - Initial Consult  Pharmacy Consult for IV Heparin Indication: chest pain/ACS  Allergies  Allergen Reactions   Cholesterol     All cholesterol meds cause pain and aching in joints   Hydrocodone Nausea Only   Statins     Joint pain   Metformin And Related     Joint pain    Patient Measurements: Height: 5\' 2"  (157.5 cm) Weight: 66.7 kg (147 lb) IBW/kg (Calculated) : 50.1 Heparin Dosing Weight: 63.8 kg  Vital Signs: Temp: 97.8 F (36.6 C) (01/18 1610) Temp Source: Oral (01/18 1610) BP: 167/79 (01/18 1800) Pulse Rate: 57 (01/18 1800)  Labs: Recent Labs    12/03/21 0756 12/03/21 1811  HGB 13.6  --   HCT 41.3  --   PLT 199  --   HEPARINUNFRC <0.10* 0.14*     Estimated Creatinine Clearance: 45.6 mL/min (by C-G formula based on SCr of 0.88 mg/dL).   Medical History: Past Medical History:  Diagnosis Date   CAD (coronary artery disease)    Catheterization 2004, 60% distal LAD, 80% ostial circumflex( not optimal for PCI), 70% small RCA, medical therapy  /  nuclear June, 2005, EF 65%, no ischemia   Carotid artery disease ([place])    Doppler, November, 2011, no significant plaque, distal R. ICA velocities are elevated and could be source of bruit, 0-39% bilateral   Diabetes mellitus    Drug therapy    Intermittent steroid use   Dyslipidemia    Edema    Ejection fraction    EF 60%, echo, November, 2011, trivial pericardial effusion   Rheumatoid arthritis(714.0)    Hospitalization August, 2011, severe RA flare,    Statin intolerance     Medications:  Scheduled:   aspirin  81 mg Oral Daily   Chlorhexidine Gluconate Cloth  6 each Topical Daily   folic acid  1 mg Oral QPM   insulin aspart  0-5 Units Subcutaneous QHS   [START ON 12/04/2021] insulin aspart  0-9 Units Subcutaneous TID WC   losartan  25 mg Oral Daily   metoprolol tartrate  12.5 mg Oral BID   sodium chloride flush  3 mL Intravenous Q12H   Infusions:   sodium chloride  Stopped (12/03/21 0737)   heparin 950 Units/hr (12/03/21 1500)   nitroGLYCERIN      Assessment: 81 years of age female status post catheterization with multivessel disease and TCTS consultation for consideration of bypass.   Heparin level came back subtherapeutic (0.14), on 950 units/hr. No s/sx of bleeding or infusion issues upon conversation with nursing.   Goal of Therapy:  Heparin level 0.3-0.7 units/ml Monitor platelets by anticoagulation protocol: Yes   Plan:  Increase IV Heparin to 1150 units/hr Check heparin level in 8 hours.  Daily Heparin level and CBC while on therapy.   Antonietta Jewel, PharmD, [place] Clinical Pharmacist  Phone: [phone] 12/03/2021 7:04 PM  Please check AMION for all [place] phone numbers After 10:00 PM, call [place] [phone]

## 2021-12-04 ENCOUNTER — Encounter (HOSPITAL_COMMUNITY): Payer: Self-pay | Admitting: Interventional Cardiology

## 2021-12-04 DIAGNOSIS — I25110 Atherosclerotic heart disease of native coronary artery with unstable angina pectoris: Secondary | ICD-10-CM | POA: Diagnosis not present

## 2021-12-04 LAB — HEPARIN LEVEL (UNFRACTIONATED)
Heparin Unfractionated: 0.9 IU/mL — ABNORMAL HIGH (ref 0.30–0.70)
Heparin Unfractionated: 1 IU/mL — ABNORMAL HIGH (ref 0.30–0.70)

## 2021-12-04 LAB — CBC
HCT: 36.4 % (ref 36.0–46.0)
Hemoglobin: 11.9 g/dL — ABNORMAL LOW (ref 12.0–15.0)
MCH: 30.4 pg (ref 26.0–34.0)
MCHC: 32.7 g/dL (ref 30.0–36.0)
MCV: 92.9 fL (ref 80.0–100.0)
Platelets: 185 10*3/uL (ref 150–400)
RBC: 3.92 MIL/uL (ref 3.87–5.11)
RDW: 15.1 % (ref 11.5–15.5)
WBC: 5.4 10*3/uL (ref 4.0–10.5)
nRBC: 0 % (ref 0.0–0.2)

## 2021-12-04 LAB — GLUCOSE, CAPILLARY
Glucose-Capillary: 112 mg/dL — ABNORMAL HIGH (ref 70–99)
Glucose-Capillary: 170 mg/dL — ABNORMAL HIGH (ref 70–99)
Glucose-Capillary: 113 mg/dL — ABNORMAL HIGH (ref 70–99)
Glucose-Capillary: 145 mg/dL — ABNORMAL HIGH (ref 70–99)

## 2021-12-04 LAB — BASIC METABOLIC PANEL
Anion gap: 7 (ref 5–15)
BUN: 8 mg/dL (ref 8–23)
CO2: 24 mmol/L (ref 22–32)
Calcium: 8.7 mg/dL — ABNORMAL LOW (ref 8.9–10.3)
Chloride: 110 mmol/L (ref 98–111)
Creatinine, Ser: 0.86 mg/dL (ref 0.44–1.00)
GFR, Estimated: >60 mL/min (ref >60)
Glucose, Bld: 122 mg/dL — ABNORMAL HIGH (ref 70–99)
Potassium: 3.9 mmol/L (ref 3.5–5.1)
Sodium: 141 mmol/L (ref 135–145)

## 2021-12-04 LAB — MAGNESIUM: Magnesium: 1.8 mg/dL (ref 1.7–2.4)

## 2021-12-04 MED ORDER — HEPARIN (PORCINE) 25000 UT/250ML-% IV SOLN
850.0000 [IU]/h | INTRAVENOUS | Status: DC
  Administered 2021-12-04 – 2021-12-08 (×4): 850 [IU]/h via INTRAVENOUS
  Filled 2021-12-04 (×3): qty 250

## 2021-12-04 MED ORDER — POTASSIUM CHLORIDE 20 MEQ PO PACK
40.0000 meq | PACK | Freq: Once | ORAL | Status: AC
  Administered 2021-12-04: 40 meq via ORAL
  Filled 2021-12-04: qty 2

## 2021-12-04 MED ORDER — POTASSIUM CHLORIDE CRYS ER 20 MEQ PO TBCR
40.0000 meq | EXTENDED_RELEASE_TABLET | Freq: Once | ORAL | Status: DC
  Filled 2021-12-04: qty 2

## 2021-12-04 MED ORDER — MAGNESIUM SULFATE 2 GM/50ML IV SOLN
2.0000 g | Freq: Once | INTRAVENOUS | Status: AC
  Administered 2021-12-04: 2 g via INTRAVENOUS
  Filled 2021-12-04: qty 50

## 2021-12-04 NOTE — Progress Notes (Signed)
Progress Note  Patient Name: [name] [name] Date of Encounter: 12/04/2021  [facility] Cardiologist: Mertie Moores, MD   Subjective   Patient had cardiac catheterization yesterday by Dr. Tamala Julian revealing left main/three-vessel disease.  She is currently pain-free on IV heparin scheduled for CABG in the near future.  Inpatient Medications    Scheduled Meds:  aspirin  81 mg Oral Daily   Chlorhexidine Gluconate Cloth  6 each Topical Daily   folic acid  1 mg Oral QPM   insulin aspart  0-5 Units Subcutaneous QHS   insulin aspart  0-9 Units Subcutaneous TID WC   losartan  25 mg Oral Daily   metoprolol tartrate  12.5 mg Oral BID   sodium chloride flush  3 mL Intravenous Q12H   Continuous Infusions:  sodium chloride Stopped (12/03/21 0737)   heparin 1,000 Units/hr (12/04/21 0724)   nitroGLYCERIN     PRN Meds: sodium chloride, acetaminophen, hydrALAZINE, nitroGLYCERIN, ondansetron (ZOFRAN) IV, sodium chloride flush   Vital Signs    Vitals:   12/04/21 0400 12/04/21 0500 12/04/21 0600 12/04/21 0700  BP: (!) 159/61 (!) 148/63 (!) 156/64 (!) 158/59  Pulse: (!) 48 (!) 55 (!) 51 (!) 54  Resp: 13 15 17 13   Temp:    97.8 F (36.6 C)  TempSrc:    Oral  SpO2: 100% 99% 100% 100%  Weight:      Height:        Intake/Output Summary (Last 24 hours) at 12/04/2021 0810 Last data filed at 12/04/2021 0500 Gross per 24 hour  Intake 571.35 ml  Output 700 ml  Net -128.65 ml   Last 3 Weights 12/02/2021 11/27/2021 06/26/2020  Weight (lbs) 147 lb 147 lb 3.2 oz 154 lb  Weight (kg) 66.679 kg 66.769 kg 69.854 kg      Telemetry    Sinus rhythm- Personally Reviewed  ECG    Not performed today- Personally Reviewed  Physical Exam   GEN: No acute distress.   Neck: No JVD Cardiac: RRR, no murmurs, rubs, or gallops.  Respiratory: Clear to auscultation bilaterally. GI: Soft, nontender, non-distended  MS: No edema; No deformity. Neuro:  Nonfocal  Psych: Normal affect   Labs    High  Sensitivity Troponin:  No results for input(s): TROPONINIHS in the last 720 hours.   Chemistry Recent Labs  Lab 11/27/21 1120  NA 140  K 4.6  CL 105  CO2 25  GLUCOSE 120*  BUN 10  CREATININE 0.88  CALCIUM 9.5  PROT 6.9  ALBUMIN 3.9  AST 24  ALT 15  ALKPHOS 70  BILITOT 0.7    Lipids  Recent Labs  Lab 11/27/21 1120  CHOL 170  TRIG 37  HDL 73  LABVLDL 8  LDLCALC 89  CHOLHDL 2.3    Hematology Recent Labs  Lab 11/27/21 1120 12/03/21 0756 12/04/21 0555  WBC 5.3 4.4 5.4  RBC 3.87 4.43 3.92  HGB 11.7 13.6 11.9*  HCT 33.6* 41.3 36.4  MCV 87 93.2 92.9  MCH 30.2 30.7 30.4  MCHC 34.8 32.9 32.7  RDW 13.7 15.1 15.1  PLT 202 199 185   Thyroid No results for input(s): TSH, FREET4 in the last 168 hours.  BNPNo results for input(s): BNP, PROBNP in the last 168 hours.  DDimer No results for input(s): DDIMER in the last 168 hours.   Radiology    CARDIAC CATHETERIZATION  Addendum Date: 12/02/2021   CONCLUSIONS: 50% distal left main.  95% ostial LAD with TIMI-3 flow. 80% mid  diagonal #2.  95% apical LAD. 30 to 50% proximal circumflex Codominant right coronary with large bifurcating acute marginal 95% and also 90 to 95% proximal RCA. Normal LV function.  Normal LVEDP. Prolonged pain after completing left coronary angiogram requiring IV nitroglycerin with up titration, IV beta-blockade, and intracoronary nitroglycerin to relieve. RECOMMENDATIONS: Admission IV nitroglycerin and IV heparin TCTS consultation for consideration of multivessel bypass.  Result Date: 12/02/2021 CONCLUSIONS: 50% distal left main.  95% ostial LAD with TIMI-3 flow. 80% mid diagonal #2.  95% apical LAD. 30 to 50% proximal circumflex Codominant right coronary with large bifurcating acute marginal 95% and also 90 to 95% proximal RCA. Normal LV function.  Normal LVEDP. Prolonged pain after completing left coronary angiogram requiring IV nitroglycerin with up titration, IV beta-blockade, and intracoronary  nitroglycerin to relieve. RECOMMENDATIONS: Admission IV nitroglycerin and IV heparin TCTS consultation for consideration of multivessel bypass.   ECHOCARDIOGRAM COMPLETE  Result Date: 12/02/2021    ECHOCARDIOGRAM REPORT   Patient Name:   [name] [name] [facility] Date of Exam: 12/02/2021 Medical Rec #:  [id number]       Height:       62.0 in Accession #:    [id number]      Weight:       147.0 lb Date of Birth:  [birth year]       BSA:          1.677 m Patient Age:    27 years        BP:           143/61 mmHg Patient Gender: F               HR:           67 bpm. Exam Location:  Inpatient Procedure: 2D Echo Indications:    CAD  History:        Patient has prior history of Echocardiogram examinations, most                 recent 05/08/2015. CAD; Risk Factors:Diabetes and Dyslipidemia.  Sonographer:    Arlyss Gandy Referring Phys: [place]  1. Left ventricular ejection fraction, by estimation, is 60 to 65%. The left ventricle has normal function. The left ventricle has no regional wall motion abnormalities. Left ventricular diastolic parameters were normal.  2. Right ventricular systolic function is normal. The right ventricular size is normal. There is normal pulmonary artery systolic pressure. The estimated right ventricular systolic pressure is 123XX123 mmHg.  3. The mitral valve is grossly normal. Trivial mitral valve regurgitation. No evidence of mitral stenosis.  4. The aortic valve is tricuspid. Aortic valve regurgitation is not visualized. No aortic stenosis is present.  5. The inferior vena cava is dilated in size with >50% respiratory variability, suggesting right atrial pressure of 8 mmHg. FINDINGS  Left Ventricle: Left ventricular ejection fraction, by estimation, is 60 to 65%. The left ventricle has normal function. The left ventricle has no regional wall motion abnormalities. The left ventricular internal cavity size was normal in size. There is  no left ventricular hypertrophy. Left ventricular  diastolic parameters were normal. Right Ventricle: The right ventricular size is normal. No increase in right ventricular wall thickness. Right ventricular systolic function is normal. There is normal pulmonary artery systolic pressure. The tricuspid regurgitant velocity is 1.97 m/s, and  with an assumed right atrial pressure of 8 mmHg, the estimated right ventricular systolic pressure is 123XX123 mmHg. Left Atrium: Left atrial size was normal in size.  Right Atrium: Right atrial size was normal in size. Pericardium: Trivial pericardial effusion is present. Mitral Valve: The mitral valve is grossly normal. Trivial mitral valve regurgitation. No evidence of mitral valve stenosis. Tricuspid Valve: The tricuspid valve is grossly normal. Tricuspid valve regurgitation is trivial. No evidence of tricuspid stenosis. Aortic Valve: The aortic valve is tricuspid. Aortic valve regurgitation is not visualized. No aortic stenosis is present. Aortic valve mean gradient measures 6.0 mmHg. Aortic valve peak gradient measures 11.3 mmHg. Aortic valve area, by VTI measures 2.13  cm. Pulmonic Valve: The pulmonic valve was grossly normal. Pulmonic valve regurgitation is trivial. No evidence of pulmonic stenosis. Aorta: The aortic root and ascending aorta are structurally normal, with no evidence of dilitation. Venous: The inferior vena cava is dilated in size with greater than 50% respiratory variability, suggesting right atrial pressure of 8 mmHg. IAS/Shunts: The atrial septum is grossly normal.  LEFT VENTRICLE PLAX 2D LVIDd:         4.10 cm   Diastology LVIDs:         2.90 cm   LV e' medial:    7.83 cm/s LV PW:         0.90 cm   LV E/e' medial:  11.4 LV IVS:        0.90 cm   LV e' lateral:   9.46 cm/s LVOT diam:     1.90 cm   LV E/e' lateral: 9.5 LV SV:         84 LV SV Index:   50 LVOT Area:     2.84 cm  RIGHT VENTRICLE             IVC RV Basal diam:  3.80 cm     IVC diam: 2.20 cm RV Mid diam:    2.70 cm RV S prime:     11.40 cm/s TAPSE  (M-mode): 2.3 cm LEFT ATRIUM             Index LA Vol (A2C):   49.9 ml 29.75 ml/m LA Vol (A4C):   35.6 ml 21.23 ml/m LA Biplane Vol: 42.1 ml 25.10 ml/m  AORTIC VALVE AV Area (Vmax):    2.09 cm AV Area (Vmean):   2.12 cm AV Area (VTI):     2.13 cm AV Vmax:           168.00 cm/s AV Vmean:          108.000 cm/s AV VTI:            0.396 m AV Peak Grad:      11.3 mmHg AV Mean Grad:      6.0 mmHg LVOT Vmax:         124.00 cm/s LVOT Vmean:        80.800 cm/s LVOT VTI:          0.297 m LVOT/AV VTI ratio: 0.75  AORTA Ao Root diam: 2.70 cm Ao Asc diam:  2.80 cm MITRAL VALVE               TRICUSPID VALVE MV Area (PHT): 3.65 cm    TR Peak grad:   15.5 mmHg MV Decel Time: 208 msec    TR Vmax:        197.00 cm/s MV E velocity: 89.50 cm/s MV A velocity: 96.80 cm/s  SHUNTS MV E/A ratio:  0.92        Systemic VTI:  0.30 m  Systemic Diam: 1.90 cm Eleonore Chiquito MD Electronically signed by Eleonore Chiquito MD Signature Date/Time: 12/02/2021/4:06:38 PM    Final    VAS US DOPPLER PRE CABG  Result Date: 12/03/2021 PREOPERATIVE VASCULAR EVALUATION Patient Name:  [name] [name]  Date of Exam:   12/02/2021 Medical Rec #: [id number]        Accession #:    [id number] Date of Birth: 1941/10/22        Patient Gender: F Patient Age:   77 years Exam Location:  [facility] Procedure:      VAS US DOPPLER PRE CABG Referring Phys: Collier Salina VANTRIGT --------------------------------------------------------------------------------  Indications:      Pre-CABG. Risk Factors:     Hyperlipidemia, Diabetes, past history of smoking, coronary                   artery disease. Limitations:      Patient intolerant to cuffs, lower extremity pain Comparison Study: No prior studies. Performing Technologist: Rogelia Rohrer RVT, RDMS Supporting Technologist: Darlin Coco RDMS, RVT  Examination Guidelines: A complete evaluation includes B-mode imaging, spectral Doppler, color Doppler, and power Doppler as needed of all accessible  portions of each vessel. Bilateral testing is considered an integral part of a complete examination. Limited examinations for reoccurring indications may be performed as noted.  Right Carotid Findings: +----------+--------+--------+--------+--------+--------+             PSV cm/s EDV cm/s Stenosis Describe Comments  +----------+--------+--------+--------+--------+--------+  CCA Prox   78       14                                   +----------+--------+--------+--------+--------+--------+  CCA Distal 65       16                                   +----------+--------+--------+--------+--------+--------+  ICA Prox   65       16                                   +----------+--------+--------+--------+--------+--------+  ICA Distal 98       33                                   +----------+--------+--------+--------+--------+--------+  ECA        59       7                                    +----------+--------+--------+--------+--------+--------+ +----------+--------+-------+---------+------------+             PSV cm/s EDV cms Describe  Arm Pressure  +----------+--------+-------+---------+------------+  Subclavian 131              Turbulent               +----------+--------+-------+---------+------------+ +---------+--------+--+--------+--+---------+  Vertebral PSV cm/s 58 EDV cm/s 15 Antegrade  +---------+--------+--+--------+--+---------+ Left Carotid Findings: +----------+--------+--------+--------+-----------------+------------------+             PSV cm/s EDV cm/s Stenosis Describe          Comments            +----------+--------+--------+--------+-----------------+------------------+  CCA Prox   84       17                                  intimal thickening  +----------+--------+--------+--------+-----------------+------------------+  CCA Distal 80       16                                  intimal thickening  +----------+--------+--------+--------+-----------------+------------------+  ICA Prox   51       14        1-39%    heterogenous mild                     +----------+--------+--------+--------+-----------------+------------------+  ICA Distal 60       18                                                      +----------+--------+--------+--------+-----------------+------------------+  ECA        87       13                                                      +----------+--------+--------+--------+-----------------+------------------+ +----------+--------+--------+----------------+------------+  Subclavian PSV cm/s EDV cm/s Describe         Arm Pressure  +----------+--------+--------+----------------+------------+             132               Multiphasic, WNL               +----------+--------+--------+----------------+------------+ +---------+--------+--+--------+--+---------+  Vertebral PSV cm/s 45 EDV cm/s 14 Antegrade  +---------+--------+--+--------+--+---------+  ABI Findings: +---------+------------------+-----+---------+--------+  Right     Rt Pressure (mmHg) Index Waveform  Comment   +---------+------------------+-----+---------+--------+  Brachial  152                      triphasic           +---------+------------------+-----+---------+--------+  PTA       148                0.97  triphasic           +---------+------------------+-----+---------+--------+  DP        138                0.91  triphasic           +---------+------------------+-----+---------+--------+  Great Toe 121                0.80  Normal              +---------+------------------+-----+---------+--------+ +---------+------------------+-----+---------+---------------------------------+  Left      Lt Pressure (mmHg) Index Waveform  Comment                            +---------+------------------+-----+---------+---------------------------------+  Brachial  136                      triphasic                                    +---------+------------------+-----+---------+---------------------------------+  PTA                                 triphasic Unable to obtain pressure due to                                                 patient pain                       +---------+------------------+-----+---------+---------------------------------+  DP        173                1.14  triphasic                                    +---------+------------------+-----+---------+---------------------------------+  Great Toe 128                0.84  Normal                                       +---------+------------------+-----+---------+---------------------------------+  Right Doppler Findings: +--------+--------+-----+---------+--------+  Site     Pressure Index Doppler   Comments  +--------+--------+-----+---------+--------+  Brachial 152            triphasic           +--------+--------+-----+---------+--------+  Radial                  triphasic           +--------+--------+-----+---------+--------+  Ulnar                   triphasic           +--------+--------+-----+---------+--------+  Left Doppler Findings: +--------+--------+-----+---------+--------+  Site     Pressure Index Doppler   Comments  +--------+--------+-----+---------+--------+  Brachial 136            triphasic           +--------+--------+-----+---------+--------+  Radial                  triphasic           +--------+--------+-----+---------+--------+  Ulnar                   triphasic           +--------+--------+-----+---------+--------+  Summary: Right Carotid: The extracranial vessels were near-normal with only minimal wall                thickening or plaque. Left Carotid: Velocities in the left ICA are consistent with a 1-39% stenosis. Vertebrals:  Bilateral vertebral arteries demonstrate antegrade flow. Subclavians: Right subclavian artery flow was disturbed. Normal flow              hemodynamics were seen in the left subclavian artery. Right ABI: Resting right ankle-brachial index is within normal range. No evidence of significant right lower extremity arterial disease. Left ABI:  Resting left ankle-brachial index is within normal range. No evidence of significant left lower extremity arterial disease. Right Upper Extremity: Doppler waveform obliterate with right radial compression. Doppler waveforms remain within normal limits with right ulnar compression. Left Upper Extremity: Doppler waveform  obliterate with left radial compression. Doppler waveforms remain within normal limits with left ulnar compression.  Electronically signed by Deitra Mayo MD on 12/03/2021 at 6:20:48 AM.    Final     Cardiac Studies   2D echocardiogram (12/03/2021)   1. Left ventricular ejection fraction, by estimation, is 60 to 65%. The  left ventricle has normal function. The left ventricle has no regional  wall motion abnormalities. Left ventricular diastolic parameters were  normal.   2. Right ventricular systolic function is normal. The right ventricular  size is normal. There is normal pulmonary artery systolic pressure. The  estimated right ventricular systolic pressure is 123XX123 mmHg.   3. The mitral valve is grossly normal. Trivial mitral valve  regurgitation. No evidence of mitral stenosis.   4. The aortic valve is tricuspid. Aortic valve regurgitation is not  visualized. No aortic stenosis is present.   5. The inferior vena cava is dilated in size with >50% respiratory  variability, suggesting right atrial pressure of 8 mmHg.    Cardiac catheterization (12/02/2021)  CONCLUSIONS: 50% distal left main.   95% ostial LAD with TIMI-3 flow. 80% mid diagonal #2.  95% apical LAD. 30 to 50% proximal circumflex Codominant right coronary with large bifurcating acute marginal 95% and also 90 to 95% proximal RCA. Normal LV function.  Normal LVEDP. Prolonged pain after completing left coronary angiogram requiring IV nitroglycerin with up titration, IV beta-blockade, and intracoronary nitroglycerin to relieve.   RECOMMENDATIONS:   Admission IV nitroglycerin and IV heparin TCTS  consultation for consideration of multivessel bypass.  Coronary Diagrams  Diagnostic Dominance: Co-dominant Intervention  Patient Profile     81 y.o. female patient of Dr. Elmarie Shiley with remote history of [place], hyperlipidemia and carotid disease who was seen recently by him 11/27/2021 with progressive chest pain which is nitrate responsive and was referred for diagnostic coronary angiography.  Assessment & Plan    1: CAD-cath yesterday revealed left main/three-vessel disease with preserved LV function.  She was seen by Dr. Kipp Brood yesterday morning in anticipation of CABG.  Her LV function is normal.  She is on IV heparin and is pain-free.  Tentative date for CABG is Monday.  2: Hyperlipidemia-statin intolerant  3: Essential hypertension-on losartan low-dose and metoprolol low-dose.    For questions or updates, please contact [place] Please consult [email or web address] for contact info under        Signed, Quay Burow, MD  12/04/2021, 8:10 AM

## 2021-12-04 NOTE — Progress Notes (Signed)
ANTICOAGULATION CONSULT NOTE - Initial Consult  Pharmacy Consult for IV Heparin Indication: chest pain/ACS  Allergies  Allergen Reactions   Cholesterol     All cholesterol meds cause pain and aching in joints   Hydrocodone Nausea Only   Statins     Joint pain   Metformin And Related     Joint pain    Patient Measurements: Height: 5\' 2"  (157.5 cm) Weight: 66.7 kg (147 lb) IBW/kg (Calculated) : 50.1 Heparin Dosing Weight: 63.8 kg  Vital Signs: Temp: 98 F (36.7 C) (01/18 2300) Temp Source: Oral (01/18 2300) BP: 148/63 (01/19 0500) Pulse Rate: 55 (01/19 0500)  Labs: Recent Labs    12/03/21 0756 12/03/21 1811 12/04/21 0555  HGB 13.6  --  11.9*  HCT 41.3  --  36.4  PLT 199  --  185  HEPARINUNFRC <0.10* 0.14* 0.90*     Estimated Creatinine Clearance: 45.6 mL/min (by C-G formula based on SCr of 0.88 mg/dL).   Medical History: Past Medical History:  Diagnosis Date   CAD (coronary artery disease)    Catheterization 2004, 60% distal LAD, 80% ostial circumflex( not optimal for PCI), 70% small RCA, medical therapy  /  nuclear June, 2005, EF 65%, no ischemia   Carotid artery disease ([place])    Doppler, November, 2011, no significant plaque, distal R. ICA velocities are elevated and could be source of bruit, 0-39% bilateral   Diabetes mellitus    Drug therapy    Intermittent steroid use   Dyslipidemia    Edema    Ejection fraction    EF 60%, echo, November, 2011, trivial pericardial effusion   Rheumatoid arthritis(714.0)    Hospitalization August, 2011, severe RA flare,    Statin intolerance     Medications:  Scheduled:   aspirin  81 mg Oral Daily   Chlorhexidine Gluconate Cloth  6 each Topical Daily   folic acid  1 mg Oral QPM   insulin aspart  0-5 Units Subcutaneous QHS   insulin aspart  0-9 Units Subcutaneous TID WC   losartan  25 mg Oral Daily   metoprolol tartrate  12.5 mg Oral BID   sodium chloride flush  3 mL Intravenous Q12H   Infusions:   sodium  chloride Stopped (12/03/21 0737)   heparin 1,150 Units/hr (12/04/21 0500)   nitroGLYCERIN      Assessment: 81 years of age female status post catheterization with multivessel disease and TCTS consultation for consideration of bypass.   Heparin level came back supratherapeutic (0.90), on 1150 units/hr. Per nurse, heparin was drawn from opposite arm as infusion. No s/sx of bleeding or infusion issues. Hgb 11.9, plts 185.  Goal of Therapy:  Heparin level 0.3-0.7 units/ml Monitor platelets by anticoagulation protocol: Yes   Plan:  Decrease IV Heparin to 1000 units/hr Check heparin level in 8 hours.  Daily Heparin level and CBC while on therapy.   Debria Garret, Student Pharmacist 12/04/2021 7:07 AM

## 2021-12-04 NOTE — Progress Notes (Signed)
ANTICOAGULATION CONSULT NOTE - Initial Consult  Pharmacy Consult for IV Heparin Indication: chest pain/ACS  Allergies  Allergen Reactions   Cholesterol     All cholesterol meds cause pain and aching in joints   Hydrocodone Nausea Only   Statins     Joint pain   Metformin And Related     Joint pain    Patient Measurements: Height: 5\' 2"  (157.5 cm) Weight: 66.7 kg (147 lb) IBW/kg (Calculated) : 50.1 Heparin Dosing Weight: 63.8 kg  Vital Signs: Temp: (P) 98.2 F (36.8 C) (01/19 1300) Temp Source: (P) Oral (01/19 1300) BP: 149/54 (01/19 1300) Pulse Rate: 48 (01/19 1300)  Labs: Recent Labs    12/03/21 0756 12/03/21 1811 12/04/21 0555 12/04/21 1435  HGB 13.6  --  11.9*  --   HCT 41.3  --  36.4  --   PLT 199  --  185  --   HEPARINUNFRC <0.10* 0.14* 0.90* 1.00*  CREATININE  --   --  0.86  --      Estimated Creatinine Clearance: 46.7 mL/min (by C-G formula based on SCr of 0.86 mg/dL).   Medical History: Past Medical History:  Diagnosis Date   CAD (coronary artery disease)    Catheterization 2004, 60% distal LAD, 80% ostial circumflex( not optimal for PCI), 70% small RCA, medical therapy  /  nuclear June, 2005, EF 65%, no ischemia   Carotid artery disease ([place])    Doppler, November, 2011, no significant plaque, distal R. ICA velocities are elevated and could be source of bruit, 0-39% bilateral   Diabetes mellitus    Drug therapy    Intermittent steroid use   Dyslipidemia    Edema    Ejection fraction    EF 60%, echo, November, 2011, trivial pericardial effusion   Rheumatoid arthritis(714.0)    Hospitalization August, 2011, severe RA flare,    Statin intolerance     Medications:  Scheduled:   aspirin  81 mg Oral Daily   Chlorhexidine Gluconate Cloth  6 each Topical Daily   folic acid  1 mg Oral QPM   insulin aspart  0-5 Units Subcutaneous QHS   insulin aspart  0-9 Units Subcutaneous TID WC   losartan  25 mg Oral Daily   metoprolol tartrate  12.5 mg Oral  BID   potassium chloride  40 mEq Oral Once   sodium chloride flush  3 mL Intravenous Q12H   Infusions:   sodium chloride Stopped (12/04/21 1110)   heparin 1,000 Units/hr (12/04/21 1605)   nitroGLYCERIN      Assessment: 81 years of age female status post catheterization with multivessel disease and TCTS consultation for consideration of bypass.   Heparin level came back supratherapeutic (1.0), on 1000 units/hr. Confirmed with patient that was drawn from opposite arm. No s/sx of bleeding or infusion issues.   Goal of Therapy:  Heparin level 0.3-0.7 units/ml Monitor platelets by anticoagulation protocol: Yes   Plan:  Hold for 1 hour Decrease IV Heparin to 850 units/hr Check heparin level in 8 hours.  Daily Heparin level and CBC while on therapy.   Antonietta Jewel, PharmD, [place] Clinical Pharmacist  Phone: [phone] 12/04/2021 5:02 PM  Please check AMION for all [place] phone numbers After 10:00 PM, call [place] [phone]

## 2021-12-04 NOTE — Progress Notes (Signed)
Inpatient Diabetes Program Recommendations  AACE/ADA: New Consensus Statement on Inpatient Glycemic Control (2015)  Target Ranges:  Prepandial:   less than 140 mg/dL      Peak postprandial:   less than 180 mg/dL (1-2 hours)      Critically ill patients:  140 - 180 mg/dL   Lab Results  Component Value Date   GLUCAP 112 (H) 12/04/2021   HGBA1C 7.3 (H) 12/03/2021    Review of Glycemic Control  Latest Reference Range & Units 12/03/21 16:06 12/03/21 21:47 12/04/21 07:09  Glucose-Capillary 70 - 99 mg/dL 150 (H) 125 (H) 112 (H)  (H): Data is abnormally high Diabetes history: Type 2 DM Outpatient Diabetes medications: Humalog 75/25 5 units QA, 3 untis QP Current orders for Inpatient glycemic control: Novolog 0-9 units TID, & HS  Inpatient Diabetes Program Recommendations:    Noted consult. In agreement with current plan. Trending well.   Thanks, Bronson Curb, MSN, RNC-OB Diabetes Coordinator [phone] (8a-5p)

## 2021-12-05 DIAGNOSIS — I25110 Atherosclerotic heart disease of native coronary artery with unstable angina pectoris: Secondary | ICD-10-CM | POA: Diagnosis not present

## 2021-12-05 LAB — CBC
HCT: 38.3 % (ref 36.0–46.0)
Hemoglobin: 12.7 g/dL (ref 12.0–15.0)
MCH: 30.6 pg (ref 26.0–34.0)
MCHC: 33.2 g/dL (ref 30.0–36.0)
MCV: 92.3 fL (ref 80.0–100.0)
Platelets: 191 10*3/uL (ref 150–400)
RBC: 4.15 MIL/uL (ref 3.87–5.11)
RDW: 14.9 % (ref 11.5–15.5)
WBC: 6.4 10*3/uL (ref 4.0–10.5)
nRBC: 0 % (ref 0.0–0.2)

## 2021-12-05 LAB — HEPARIN LEVEL (UNFRACTIONATED)
Heparin Unfractionated: 0.55 IU/mL (ref 0.30–0.70)
Heparin Unfractionated: 0.59 IU/mL (ref 0.30–0.70)

## 2021-12-05 LAB — BASIC METABOLIC PANEL
Anion gap: 5 (ref 5–15)
BUN: 7 mg/dL — ABNORMAL LOW (ref 8–23)
CO2: 25 mmol/L (ref 22–32)
Calcium: 8.7 mg/dL — ABNORMAL LOW (ref 8.9–10.3)
Chloride: 107 mmol/L (ref 98–111)
Creatinine, Ser: 0.86 mg/dL (ref 0.44–1.00)
GFR, Estimated: >60 mL/min (ref >60)
Glucose, Bld: 189 mg/dL — ABNORMAL HIGH (ref 70–99)
Potassium: 4 mmol/L (ref 3.5–5.1)
Sodium: 137 mmol/L (ref 135–145)

## 2021-12-05 LAB — GLUCOSE, CAPILLARY
Glucose-Capillary: 101 mg/dL — ABNORMAL HIGH (ref 70–99)
Glucose-Capillary: 153 mg/dL — ABNORMAL HIGH (ref 70–99)
Glucose-Capillary: 122 mg/dL — ABNORMAL HIGH (ref 70–99)
Glucose-Capillary: 164 mg/dL — ABNORMAL HIGH (ref 70–99)

## 2021-12-05 MED ORDER — PHENYLEPHRINE HCL-NACL 20-0.9 MG/250ML-% IV SOLN
30.0000 ug/min | INTRAVENOUS | Status: AC
  Administered 2021-12-08: 40 ug/min via INTRAVENOUS
  Filled 2021-12-05: qty 250

## 2021-12-05 MED ORDER — EPINEPHRINE HCL 5 MG/250ML IV SOLN IN NS
0.0000 ug/min | INTRAVENOUS | Status: DC
  Filled 2021-12-05: qty 250

## 2021-12-05 MED ORDER — MANNITOL 20 % IV SOLN
INTRAVENOUS | Status: DC
  Filled 2021-12-05: qty 13

## 2021-12-05 MED ORDER — PLASMA-LYTE A IV SOLN
INTRAVENOUS | Status: DC
  Filled 2021-12-05: qty 5

## 2021-12-05 MED ORDER — MILRINONE LACTATE IN DEXTROSE 20-5 MG/100ML-% IV SOLN
0.3000 ug/kg/min | INTRAVENOUS | Status: AC
  Administered 2021-12-08: .125 ug/kg/min via INTRAVENOUS
  Filled 2021-12-05: qty 100

## 2021-12-05 MED ORDER — CEFAZOLIN SODIUM-DEXTROSE 2-4 GM/100ML-% IV SOLN
2.0000 g | INTRAVENOUS | Status: AC
  Administered 2021-12-08 (×2): 2 g via INTRAVENOUS
  Filled 2021-12-05: qty 100

## 2021-12-05 MED ORDER — NOREPINEPHRINE 4 MG/250ML-% IV SOLN
0.0000 ug/min | INTRAVENOUS | Status: AC
  Administered 2021-12-08: 1 ug/min via INTRAVENOUS
  Filled 2021-12-05: qty 250

## 2021-12-05 MED ORDER — TRANEXAMIC ACID 1000 MG/10ML IV SOLN
1.5000 mg/kg/h | INTRAVENOUS | Status: AC
  Administered 2021-12-08: 1.5 mg/kg/h via INTRAVENOUS
  Filled 2021-12-05: qty 25

## 2021-12-05 MED ORDER — INSULIN REGULAR(HUMAN) IN NACL 100-0.9 UT/100ML-% IV SOLN
INTRAVENOUS | Status: AC
  Administered 2021-12-08: 2.2 [IU]/h via INTRAVENOUS
  Filled 2021-12-05: qty 100

## 2021-12-05 MED ORDER — TRANEXAMIC ACID (OHS) BOLUS VIA INFUSION
15.0000 mg/kg | INTRAVENOUS | Status: AC
  Administered 2021-12-08: 1000.5 mg via INTRAVENOUS
  Filled 2021-12-05: qty 1001

## 2021-12-05 MED ORDER — HEPARIN 30,000 UNITS/1000 ML (OHS) CELLSAVER SOLUTION
Status: DC
  Filled 2021-12-05: qty 1000

## 2021-12-05 MED ORDER — TRANEXAMIC ACID (OHS) PUMP PRIME SOLUTION
2.0000 mg/kg | INTRAVENOUS | Status: DC
  Filled 2021-12-05: qty 1.33

## 2021-12-05 MED ORDER — NITROGLYCERIN IN D5W 200-5 MCG/ML-% IV SOLN
2.0000 ug/min | INTRAVENOUS | Status: DC
  Filled 2021-12-05: qty 250

## 2021-12-05 MED ORDER — CEFAZOLIN SODIUM-DEXTROSE 2-4 GM/100ML-% IV SOLN
2.0000 g | INTRAVENOUS | Status: DC
  Filled 2021-12-05: qty 100

## 2021-12-05 MED ORDER — DEXMEDETOMIDINE HCL IN NACL 400 MCG/100ML IV SOLN
0.1000 ug/kg/h | INTRAVENOUS | Status: DC
  Filled 2021-12-05: qty 100

## 2021-12-05 MED ORDER — VANCOMYCIN HCL 1250 MG/250ML IV SOLN
1250.0000 mg | INTRAVENOUS | Status: AC
  Administered 2021-12-08: 1250 mg via INTRAVENOUS
  Filled 2021-12-05: qty 250

## 2021-12-05 MED ORDER — POTASSIUM CHLORIDE 2 MEQ/ML IV SOLN
80.0000 meq | INTRAVENOUS | Status: DC
  Filled 2021-12-05: qty 40

## 2021-12-05 NOTE — Progress Notes (Signed)
ANTICOAGULATION CONSULT NOTE - Follow Up Consult  Pharmacy Consult for heparin Indication:  CAD awaiting CABG  Labs: Recent Labs    12/03/21 0756 12/03/21 1811 12/04/21 0555 12/04/21 1435 12/05/21 0154  HGB 13.6  --  11.9*  --  12.7  HCT 41.3  --  36.4  --  38.3  PLT 199  --  185  --  191  HEPARINUNFRC <0.10*   < > 0.90* 1.00* 0.55  CREATININE  --   --  0.86  --   --    < > = values in this interval not displayed.    Assessment/Plan:  81yo female therapeutic on heparin after rate change. Will continue infusion at current rate of 850 units/hr and confirm stable with additional level.   Wynona Neat, PharmD, BCPS  12/05/2021,3:38 AM

## 2021-12-05 NOTE — Progress Notes (Signed)
CARDIAC REHAB PHASE I   PRE:  Rate/Rhythm: 53 SB    BP: sitting 149/59    SaO2: 100 RA  MODE:  Ambulation: 420 ft   POST:  Rate/Rhythm: 80 SR, trigeminy after walking    BP: sitting 157/82     SaO2: 100 RA  Pt eager to mobilize. Initially weak getting out of bed, not standing upright. Able to correct with holding RW. Sts she feels weak from inactivity. First walk today. Slow and steady with RW, no CP. Did have trigeminy after walk, asx.   Discussed IS, sternal precautions, mobility post op, and d/c planning. Receptive. She seems generally flat/depressed. Has had several losses in past year, including her husband. Her brother plans to come and stay with her at d/c. 1000 ml IS, encouraged use. Likes to walk/mobilize. Left in recliner watching preop video. [place], ACSM 12/05/2021 2:18 PM

## 2021-12-05 NOTE — Progress Notes (Signed)
Report called to RN on 6E, Pt aware of the transfer and room number, vs wnl a time of transfer. Pt sitting up in the chair with no distress noted.

## 2021-12-05 NOTE — Progress Notes (Signed)
ANTICOAGULATION CONSULT NOTE - Initial Consult  Pharmacy Consult for IV Heparin Indication: chest pain/ACS  Allergies  Allergen Reactions   Cholesterol     All cholesterol meds cause pain and aching in joints   Hydrocodone Nausea Only   Statins     Joint pain   Metformin And Related     Joint pain    Patient Measurements: Height: 5\' 2"  (157.5 cm) Weight: 66.7 kg (147 lb) IBW/kg (Calculated) : 50.1 Heparin Dosing Weight: 63.8 kg  Vital Signs: Temp: 97.9 F (36.6 C) (01/20 1138) Temp Source: Oral (01/20 1138) BP: 145/102 (01/20 1200) Pulse Rate: 59 (01/20 1400)  Labs: Recent Labs    12/03/21 0756 12/03/21 1811 12/04/21 0555 12/04/21 1435 12/05/21 0154 12/05/21 1056  HGB 13.6  --  11.9*  --  12.7  --   HCT 41.3  --  36.4  --  38.3  --   PLT 199  --  185  --  191  --   HEPARINUNFRC <0.10*   < > 0.90* 1.00* 0.55 0.59  CREATININE  --   --  0.86  --   --  0.86   < > = values in this interval not displayed.     Estimated Creatinine Clearance: 46.7 mL/min (by C-G formula based on SCr of 0.86 mg/dL).   Medical History: Past Medical History:  Diagnosis Date   CAD (coronary artery disease)    Catheterization 2004, 60% distal LAD, 80% ostial circumflex( not optimal for PCI), 70% small RCA, medical therapy  /  nuclear June, 2005, EF 65%, no ischemia   Carotid artery disease ([place])    Doppler, November, 2011, no significant plaque, distal R. ICA velocities are elevated and could be source of bruit, 0-39% bilateral   Diabetes mellitus    Drug therapy    Intermittent steroid use   Dyslipidemia    Edema    Ejection fraction    EF 60%, echo, November, 2011, trivial pericardial effusion   Rheumatoid arthritis(714.0)    Hospitalization August, 2011, severe RA flare,    Statin intolerance     Medications:  Scheduled:   aspirin  81 mg Oral Daily   Chlorhexidine Gluconate Cloth  6 each Topical Daily   [START ON 12/08/2021] epinephrine  0-10 mcg/min Intravenous To OR    folic acid  1 mg Oral QPM   [START ON 12/08/2021] heparin-papaverine-plasmalyte irrigation   Irrigation To OR   insulin aspart  0-5 Units Subcutaneous QHS   insulin aspart  0-9 Units Subcutaneous TID WC   [START ON 12/08/2021] insulin   Intravenous To OR   [START ON 12/08/2021] [facility] Blood Cardioplegia vial (lidocaine/magnesium/mannitol 0.26g-4g-6.4g)   Intracoronary To OR   losartan  25 mg Oral Daily   metoprolol tartrate  12.5 mg Oral BID   [START ON 12/08/2021] phenylephrine  30-200 mcg/min Intravenous To OR   [START ON 12/08/2021] potassium chloride  80 mEq Other To OR   sodium chloride flush  3 mL Intravenous Q12H   [START ON 12/08/2021] tranexamic acid  15 mg/kg Intravenous To OR   [START ON 12/08/2021] tranexamic acid  2 mg/kg Intracatheter To OR   Infusions:   sodium chloride Stopped (12/04/21 1110)   [START ON 12/08/2021]  ceFAZolin (ANCEF) IV     [START ON 12/08/2021]  ceFAZolin (ANCEF) IV     [START ON 12/08/2021] dexmedetomidine     [START ON 12/08/2021] heparin 30,000 units/NS 1000 mL solution for CELLSAVER     heparin 850 Units/hr (  12/05/21 1200)   [START ON 12/08/2021] milrinone     nitroGLYCERIN     [START ON 12/08/2021] nitroGLYCERIN     [START ON 12/08/2021] norepinephrine     [START ON 12/08/2021] tranexamic acid (CYKLOKAPRON) infusion ([facility])     [START ON 12/08/2021] vancomycin      Assessment: 81 years of age female status post catheterization with multivessel disease and TCTS consultation for consideration of bypass.   Heparin level came back therapeutic 0.59 and previously at 0.55, on 850 units/hr. No s/sx of bleeding or infusion issues. CBC within normal limits and stable.  Goal of Therapy:  Heparin level 0.3-0.7 units/ml Monitor platelets by anticoagulation protocol: Yes   Plan:  Continue IV Heparin to 850 units/hr Daily Heparin level and CBC while on therapy.    Varney Daily, PharmD PGY1 Pharmacy Resident  Please check AMION for all [facility] pharmacy phone  numbers After 10:00 PM call main pharmacy [phone]

## 2021-12-05 NOTE — Progress Notes (Signed)
Progress Note  Patient Name: [name] [name] Date of Encounter: 12/05/2021  [facility] Cardiologist: Kristeen Miss, MD   Subjective   Patient had cardiac catheterization 12/02/2021 by Dr. Katrinka Blazing revealing left main/three-vessel disease.  She is currently pain-free on IV heparin scheduled for CABG by Dr. Cliffton Asters on Monday.  Inpatient Medications    Scheduled Meds:  aspirin  81 mg Oral Daily   Chlorhexidine Gluconate Cloth  6 each Topical Daily   folic acid  1 mg Oral QPM   insulin aspart  0-5 Units Subcutaneous QHS   insulin aspart  0-9 Units Subcutaneous TID WC   losartan  25 mg Oral Daily   metoprolol tartrate  12.5 mg Oral BID   sodium chloride flush  3 mL Intravenous Q12H   Continuous Infusions:  sodium chloride Stopped (12/04/21 1110)   heparin 850 Units/hr (12/05/21 0600)   nitroGLYCERIN     PRN Meds: sodium chloride, acetaminophen, hydrALAZINE, nitroGLYCERIN, ondansetron (ZOFRAN) IV, sodium chloride flush   Vital Signs    Vitals:   12/05/21 0500 12/05/21 0600 12/05/21 0700 12/05/21 0753  BP: (!) 124/52 (!) 185/63 (!) 155/63   Pulse: (!) 44 (!) 47 (!) 55   Resp: Temp:    98 F (36.7 C)  TempSrc:    Oral  SpO2: 100% 100% 100%   Weight:      Height:        Intake/Output Summary (Last 24 hours) at 12/05/2021 0836 Last data filed at 12/05/2021 0600 Gross per 24 hour  Intake 234 ml  Output 1000 ml  Net -766 ml   Last 3 Weights 12/02/2021 11/27/2021 06/26/2020  Weight (lbs) 147 lb 147 lb 3.2 oz 154 lb  Weight (kg) 66.679 kg 66.769 kg 69.854 kg      Telemetry    Sinus rhythm with PVCs- Personally Reviewed  ECG    Not performed today- Personally Reviewed  Physical Exam   GEN: No acute distress.   Neck: No JVD Cardiac: RRR, no murmurs, rubs, or gallops.  Respiratory: Clear to auscultation bilaterally. GI: Soft, nontender, non-distended  MS: No edema; No deformity. Neuro:  Nonfocal  Psych: Normal affect   Labs    High Sensitivity  Troponin:  No results for input(s): TROPONINIHS in the last 720 hours.   Chemistry Recent Labs  Lab 12/04/21 0555  NA 141  K 3.9  CL 110  CO2 24  GLUCOSE 122*  BUN 8  CREATININE 0.86  CALCIUM 8.7*  MG 1.8  GFRNONAA >60  ANIONGAP 7    Lipids  No results for input(s): CHOL, TRIG, HDL, LABVLDL, LDLCALC, CHOLHDL in the last 168 hours.   Hematology Recent Labs  Lab 12/03/21 0756 12/04/21 0555 12/05/21 0154  WBC 4.4 5.4 6.4  RBC 4.43 3.92 4.15  HGB 13.6 11.9* 12.7  HCT 41.3 36.4 38.3  MCV 93.2 92.9 92.3  MCH 30.7 30.4 30.6  MCHC 32.9 32.7 33.2  RDW 15.1 15.1 14.9  PLT 199 185 191   Thyroid No results for input(s): TSH, FREET4 in the last 168 hours.  BNPNo results for input(s): BNP, PROBNP in the last 168 hours.  DDimer No results for input(s): DDIMER in the last 168 hours.   Radiology    No results found.  Cardiac Studies   2D echocardiogram (12/03/2021)   1. Left ventricular ejection fraction, by estimation, is 60 to 65%. The  left ventricle has normal function. The left ventricle has no regional  wall motion  abnormalities. Left ventricular diastolic parameters were  normal.   2. Right ventricular systolic function is normal. The right ventricular  size is normal. There is normal pulmonary artery systolic pressure. The  estimated right ventricular systolic pressure is 23.5 mmHg.   3. The mitral valve is grossly normal. Trivial mitral valve  regurgitation. No evidence of mitral stenosis.   4. The aortic valve is tricuspid. Aortic valve regurgitation is not  visualized. No aortic stenosis is present.   5. The inferior vena cava is dilated in size with >50% respiratory  variability, suggesting right atrial pressure of 8 mmHg.    Cardiac catheterization (12/02/2021)  CONCLUSIONS: 50% distal left main.   95% ostial LAD with TIMI-3 flow. 80% mid diagonal #2.  95% apical LAD. 30 to 50% proximal circumflex Codominant right coronary with large bifurcating acute  marginal 95% and also 90 to 95% proximal RCA. Normal LV function.  Normal LVEDP. Prolonged pain after completing left coronary angiogram requiring IV nitroglycerin with up titration, IV beta-blockade, and intracoronary nitroglycerin to relieve.   RECOMMENDATIONS:   Admission IV nitroglycerin and IV heparin TCTS consultation for consideration of multivessel bypass.  Coronary Diagrams  Diagnostic Dominance: Co-dominant Intervention  Patient Profile     81 y.o. female patient of Dr. Harvie Bridge with remote history of PTCA 1997, hyperlipidemia and carotid disease who was seen recently by him 11/27/2021 with progressive chest pain which is nitrate responsive and was referred for diagnostic coronary angiography.  Assessment & Plan    1: CAD-cath yesterday revealed left main/three-vessel disease with preserved LV function.  She was seen by Dr. Cliffton Asters yesterday morning in anticipation of CABG.  Her LV function is normal.  She is on IV heparin and is pain-free.  Tentative date for CABG is Monday with Dr. Cliffton Asters  2: Hyperlipidemia-statin intolerant  3: Essential hypertension-on losartan low-dose and metoprolol low-dose.  She has been clinically stable over the last several days on IV heparin.  She denies chest pain.  I am going to transfer her to a telemetry bed today for observation over the weekend until her revascularization procedure on Monday.  For questions or updates, please contact [facility] Please consult [email or web address] for contact info under        Signed, Nanetta Batty, MD  12/05/2021, 8:36 AM

## 2021-12-06 ENCOUNTER — Inpatient Hospital Stay (HOSPITAL_COMMUNITY): Payer: Medicare PPO

## 2021-12-06 DIAGNOSIS — I25110 Atherosclerotic heart disease of native coronary artery with unstable angina pectoris: Secondary | ICD-10-CM | POA: Diagnosis not present

## 2021-12-06 DIAGNOSIS — I200 Unstable angina: Secondary | ICD-10-CM

## 2021-12-06 LAB — BASIC METABOLIC PANEL
Anion gap: 6 (ref 5–15)
BUN: 8 mg/dL (ref 8–23)
CO2: 21 mmol/L — ABNORMAL LOW (ref 22–32)
Calcium: 8.8 mg/dL — ABNORMAL LOW (ref 8.9–10.3)
Chloride: 109 mmol/L (ref 98–111)
Creatinine, Ser: 0.89 mg/dL (ref 0.44–1.00)
GFR, Estimated: >60 mL/min (ref >60)
Glucose, Bld: 107 mg/dL — ABNORMAL HIGH (ref 70–99)
Potassium: 4.2 mmol/L (ref 3.5–5.1)
Sodium: 136 mmol/L (ref 135–145)

## 2021-12-06 LAB — COMPREHENSIVE METABOLIC PANEL
ALT: 25 U/L (ref 0–44)
AST: 26 U/L (ref 15–41)
Albumin: 3 g/dL — ABNORMAL LOW (ref 3.5–5.0)
Alkaline Phosphatase: 63 U/L (ref 38–126)
Anion gap: 6 (ref 5–15)
BUN: 8 mg/dL (ref 8–23)
CO2: 26 mmol/L (ref 22–32)
Calcium: 9 mg/dL (ref 8.9–10.3)
Chloride: 104 mmol/L (ref 98–111)
Creatinine, Ser: 1.06 mg/dL — ABNORMAL HIGH (ref 0.44–1.00)
GFR, Estimated: 53 mL/min — ABNORMAL LOW (ref >60)
Glucose, Bld: 229 mg/dL — ABNORMAL HIGH (ref 70–99)
Potassium: 4.4 mmol/L (ref 3.5–5.1)
Sodium: 136 mmol/L (ref 135–145)
Total Bilirubin: 0.4 mg/dL (ref 0.3–1.2)
Total Protein: 6.7 g/dL (ref 6.5–8.1)

## 2021-12-06 LAB — PROTIME-INR
INR: 1.1 (ref 0.8–1.2)
Prothrombin Time: 14.4 seconds (ref 11.4–15.2)

## 2021-12-06 LAB — HEPARIN LEVEL (UNFRACTIONATED): Heparin Unfractionated: 0.65 IU/mL (ref 0.30–0.70)

## 2021-12-06 LAB — CBC
HCT: 40.5 % (ref 36.0–46.0)
Hemoglobin: 13.3 g/dL (ref 12.0–15.0)
MCH: 30.4 pg (ref 26.0–34.0)
MCHC: 32.8 g/dL (ref 30.0–36.0)
MCV: 92.5 fL (ref 80.0–100.0)
Platelets: 172 10*3/uL (ref 150–400)
RBC: 4.38 MIL/uL (ref 3.87–5.11)
RDW: 14.8 % (ref 11.5–15.5)
WBC: 6.6 10*3/uL (ref 4.0–10.5)
nRBC: 0 % (ref 0.0–0.2)

## 2021-12-06 LAB — BLOOD GAS, ARTERIAL
Acid-base deficit: 2.3 mmol/L — ABNORMAL HIGH (ref 0.0–2.0)
Bicarbonate: 20.1 mmol/L (ref 20.0–28.0)
Drawn by: 460021
FIO2: 21
O2 Saturation: 98.7 %
Patient temperature: 36.9
pCO2 arterial: 24 mmHg — ABNORMAL LOW (ref 32.0–48.0)
pH, Arterial: 7.532 — ABNORMAL HIGH (ref 7.350–7.450)
pO2, Arterial: 110 mmHg — ABNORMAL HIGH (ref 83.0–108.0)

## 2021-12-06 LAB — URINALYSIS, ROUTINE W REFLEX MICROSCOPIC
Bilirubin Urine: NEGATIVE
Glucose, UA: NEGATIVE mg/dL
Hgb urine dipstick: NEGATIVE
Ketones, ur: NEGATIVE mg/dL
Leukocytes,Ua: NEGATIVE
Nitrite: NEGATIVE
Protein, ur: NEGATIVE mg/dL
Specific Gravity, Urine: 1.012 (ref 1.005–1.030)
pH: 6 (ref 5.0–8.0)

## 2021-12-06 LAB — GLUCOSE, CAPILLARY
Glucose-Capillary: 88 mg/dL (ref 70–99)
Glucose-Capillary: 183 mg/dL — ABNORMAL HIGH (ref 70–99)
Glucose-Capillary: 77 mg/dL (ref 70–99)
Glucose-Capillary: 163 mg/dL — ABNORMAL HIGH (ref 70–99)

## 2021-12-06 LAB — SURGICAL PCR SCREEN
MRSA, PCR: NEGATIVE
Staphylococcus aureus: NEGATIVE

## 2021-12-06 LAB — ABO/RH: ABO/RH(D): A POS

## 2021-12-06 LAB — APTT: aPTT: 110 seconds — ABNORMAL HIGH (ref 24–36)

## 2021-12-06 NOTE — Progress Notes (Signed)
Abnormal labs reported to [place], [place].  No new orders received.

## 2021-12-06 NOTE — Progress Notes (Signed)
ANTICOAGULATION CONSULT NOTE - Follow Up Consult  Pharmacy Consult for IV Heparin Indication: chest pain/ACS  Allergies  Allergen Reactions   Cholesterol     All cholesterol meds cause pain and aching in joints   Hydrocodone Nausea Only   Statins     Joint pain   Metformin And Related     Joint pain    Patient Measurements: Height: 5\' 2"  (157.5 cm) Weight: 66.7 kg (147 lb) IBW/kg (Calculated) : 50.1 Heparin Dosing Weight: 63.8 kg  Vital Signs: Temp: 98.3 F (36.8 C) (01/21 0552) Temp Source: Oral (01/21 0552) BP: 152/60 (01/21 0552) Pulse Rate: 68 (01/21 0552)  Labs: Recent Labs    12/04/21 0555 12/04/21 1435 12/05/21 0154 12/05/21 1056 12/06/21 0429  HGB 11.9*  --  12.7  --  13.3  HCT 36.4  --  38.3  --  40.5  PLT 185  --  191  --  172  HEPARINUNFRC 0.90*   < > 0.55 0.59 0.65  CREATININE 0.86  --   --  0.86 0.89   < > = values in this interval not displayed.     Estimated Creatinine Clearance: 45.1 mL/min (by C-G formula based on SCr of 0.89 mg/dL).   Medical History: Past Medical History:  Diagnosis Date   CAD (coronary artery disease)    Catheterization 2004, 60% distal LAD, 80% ostial circumflex( not optimal for PCI), 70% small RCA, medical therapy  /  nuclear June, 2005, EF 65%, no ischemia   Carotid artery disease ([place])    Doppler, November, 2011, no significant plaque, distal R. ICA velocities are elevated and could be source of bruit, 0-39% bilateral   Diabetes mellitus    Drug therapy    Intermittent steroid use   Dyslipidemia    Edema    Ejection fraction    EF 60%, echo, November, 2011, trivial pericardial effusion   Rheumatoid arthritis(714.0)    Hospitalization August, 2011, severe RA flare,    Statin intolerance     Medications:  Scheduled:   aspirin  81 mg Oral Daily   Chlorhexidine Gluconate Cloth  6 each Topical Daily   [START ON 12/08/2021] epinephrine  0-10 mcg/min Intravenous To OR   folic acid  1 mg Oral QPM   [START ON  12/08/2021] heparin-papaverine-plasmalyte irrigation   Irrigation To OR   insulin aspart  0-5 Units Subcutaneous QHS   insulin aspart  0-9 Units Subcutaneous TID WC   [START ON 12/08/2021] insulin   Intravenous To OR   [START ON 12/08/2021] [facility] Blood Cardioplegia vial (lidocaine/magnesium/mannitol 0.26g-4g-6.4g)   Intracoronary To OR   losartan  25 mg Oral Daily   metoprolol tartrate  12.5 mg Oral BID   [START ON 12/08/2021] phenylephrine  30-200 mcg/min Intravenous To OR   [START ON 12/08/2021] potassium chloride  80 mEq Other To OR   sodium chloride flush  3 mL Intravenous Q12H   [START ON 12/08/2021] tranexamic acid  15 mg/kg Intravenous To OR   [START ON 12/08/2021] tranexamic acid  2 mg/kg Intracatheter To OR   Infusions:   sodium chloride Stopped (12/04/21 1110)   [START ON 12/08/2021]  ceFAZolin (ANCEF) IV     [START ON 12/08/2021]  ceFAZolin (ANCEF) IV     [START ON 12/08/2021] dexmedetomidine     [START ON 12/08/2021] heparin 30,000 units/NS 1000 mL solution for CELLSAVER     heparin 850 Units/hr (12/06/21 0011)   [START ON 12/08/2021] milrinone     nitroGLYCERIN     [  START ON 12/08/2021] nitroGLYCERIN     [START ON 12/08/2021] norepinephrine     [START ON 12/08/2021] tranexamic acid (CYKLOKAPRON) infusion ([facility])     [START ON 12/08/2021] vancomycin      Assessment: 81 years of age female status post catheterization with multivessel disease and TCTS consultation for consideration of bypass.   Heparin level came back therapeutic 0.65, on 850 units/hr. No s/sx of bleeding or infusion issues. CBC within normal limits and stable.  Goal of Therapy:  Heparin level 0.3-0.7 units/ml Monitor platelets by anticoagulation protocol: Yes   Plan:  Continue IV Heparin to 850 units/hr Daily Heparin level and CBC while on therapy.   Marzella Schlein [place], Student Pharmacist   Please check AMION for all [facility] pharmacy phone numbers After 10:00 PM call main pharmacy [phone]

## 2021-12-06 NOTE — Progress Notes (Signed)
CARDIAC REHAB PHASE I   PRE:  Rate/Rhythm: 74 SR  BP:  Sitting: 182/68      SaO2: 100 RA  MODE:  Ambulation: 100 ft   POST:  Rate/Rhythm: 108 ST  BP:  Sitting: 159/78      SaO2: 99 RA  Pt tolerated exercise well and amb 400 ft steadying w/ IV pole, and contact guard steadying assist. Pt denies CP, SOB, or dizziness throughout walk. Discussed sternal precautions and IS use. Pt left w/ nurse giving morning meds. Will continue to follow.  [id number] Joya San, MS, ACSM-CEP 12/06/2021 10:03 AM

## 2021-12-06 NOTE — Progress Notes (Signed)
Progress Note  Patient Name: [name] [name] Date of Encounter: 12/06/2021  [facility] Cardiologist: Kristeen MissPhilip Nahser, MD   Subjective   No chest pain on heparin   Inpatient Medications    Scheduled Meds:  aspirin  81 mg Oral Daily   Chlorhexidine Gluconate Cloth  6 each Topical Daily   [START ON 12/08/2021] epinephrine  0-10 mcg/min Intravenous To OR   folic acid  1 mg Oral QPM   [START ON 12/08/2021] heparin-papaverine-plasmalyte irrigation   Irrigation To OR   insulin aspart  0-5 Units Subcutaneous QHS   insulin aspart  0-9 Units Subcutaneous TID WC   [START ON 12/08/2021] insulin   Intravenous To OR   [START ON 12/08/2021] [facility] Blood Cardioplegia vial (lidocaine/magnesium/mannitol 0.26g-4g-6.4g)   Intracoronary To OR   losartan  25 mg Oral Daily   metoprolol tartrate  12.5 mg Oral BID   [START ON 12/08/2021] phenylephrine  30-200 mcg/min Intravenous To OR   [START ON 12/08/2021] potassium chloride  80 mEq Other To OR   sodium chloride flush  3 mL Intravenous Q12H   [START ON 12/08/2021] tranexamic acid  15 mg/kg Intravenous To OR   [START ON 12/08/2021] tranexamic acid  2 mg/kg Intracatheter To OR   Continuous Infusions:  sodium chloride Stopped (12/04/21 1110)   [START ON 12/08/2021]  ceFAZolin (ANCEF) IV     [START ON 12/08/2021]  ceFAZolin (ANCEF) IV     [START ON 12/08/2021] dexmedetomidine     [START ON 12/08/2021] heparin 30,000 units/NS 1000 mL solution for CELLSAVER     heparin 850 Units/hr (12/06/21 0011)   [START ON 12/08/2021] milrinone     nitroGLYCERIN     [START ON 12/08/2021] nitroGLYCERIN     [START ON 12/08/2021] norepinephrine     [START ON 12/08/2021] tranexamic acid (CYKLOKAPRON) infusion ([facility])     [START ON 12/08/2021] vancomycin     PRN Meds: sodium chloride, acetaminophen, hydrALAZINE, nitroGLYCERIN, ondansetron (ZOFRAN) IV, sodium chloride flush   Vital Signs    Vitals:   12/05/21 1515 12/05/21 2124 12/05/21 2208 12/06/21 0552  BP: (!) 147/104  (!) 147/56  (!) 152/60  Pulse: 67 60 69 68  Resp: 15 14  15   Temp: 98.4 F (36.9 C) 97.9 F (36.6 C)  98.3 F (36.8 C)  TempSrc: Oral Oral  Oral  SpO2: 100% 100%  100%  Weight:      Height:        Intake/Output Summary (Last 24 hours) at 12/06/2021 0806 Last data filed at 12/06/2021 0559 Gross per 24 hour  Intake 533.91 ml  Output 900 ml  Net -366.09 ml   Last 3 Weights 12/02/2021 11/27/2021 06/26/2020  Weight (lbs) 147 lb 147 lb 3.2 oz 154 lb  Weight (kg) 66.679 kg 66.769 kg 69.854 kg      Telemetry    Sinus rhythm with PVCs- Personally Reviewed  ECG    Not performed today- Personally Reviewed  Physical Exam   Affect appropriate Healthy:  appears stated age HEENT: normal Neck supple with no adenopathy JVP normal no bruits no thyromegaly Lungs clear with no wheezing and good diaphragmatic motion Heart:  S1/S2 no murmur, no rub, gallop or click PMI normal Abdomen: benighn, BS positve, no tenderness, no AAA no bruit.  No HSM or HJR Distal pulses intact with no bruits No edema Neuro non-focal Skin warm and dry No muscular weakness   Labs    High Sensitivity Troponin:  No results for input(s): TROPONINIHS in the last  720 hours.   Chemistry Recent Labs  Lab 12/04/21 0555 12/05/21 1056 12/06/21 0429  NA 141 137 136  K 3.9 4.0 4.2  CL 110 107 109  CO2 24 25 21*  GLUCOSE 122* 189* 107*  BUN 8 7* 8  CREATININE 0.86 0.86 0.89  CALCIUM 8.7* 8.7* 8.8*  MG 1.8  --   --   GFRNONAA >60 >60 >60  ANIONGAP 7 5 6     Lipids  No results for input(s): CHOL, TRIG, HDL, LABVLDL, LDLCALC, CHOLHDL in the last 168 hours.   Hematology Recent Labs  Lab 12/04/21 0555 12/05/21 0154 12/06/21 0429  WBC 5.4 6.4 6.6  RBC 3.92 4.15 4.38  HGB 11.9* 12.7 13.3  HCT 36.4 38.3 40.5  MCV 92.9 92.3 92.5  MCH 30.4 30.6 30.4  MCHC 32.7 33.2 32.8  RDW 15.1 14.9 14.8  PLT 185 191 172   Thyroid No results for input(s): TSH, FREET4 in the last 168 hours.  BNPNo results for  input(s): BNP, PROBNP in the last 168 hours.  DDimer No results for input(s): DDIMER in the last 168 hours.   Radiology    No results found.  Cardiac Studies   2D echocardiogram (12/03/2021)   1. Left ventricular ejection fraction, by estimation, is 60 to 65%. The  left ventricle has normal function. The left ventricle has no regional  wall motion abnormalities. Left ventricular diastolic parameters were  normal.   2. Right ventricular systolic function is normal. The right ventricular  size is normal. There is normal pulmonary artery systolic pressure. The  estimated right ventricular systolic pressure is 23.5 mmHg.   3. The mitral valve is grossly normal. Trivial mitral valve  regurgitation. No evidence of mitral stenosis.   4. The aortic valve is tricuspid. Aortic valve regurgitation is not  visualized. No aortic stenosis is present.   5. The inferior vena cava is dilated in size with >50% respiratory  variability, suggesting right atrial pressure of 8 mmHg.    Cardiac catheterization (12/02/2021)  CONCLUSIONS: 50% distal left main.   95% ostial LAD with TIMI-3 flow. 80% mid diagonal #2.  95% apical LAD. 30 to 50% proximal circumflex Codominant right coronary with large bifurcating acute marginal 95% and also 90 to 95% proximal RCA. Normal LV function.  Normal LVEDP. Prolonged pain after completing left coronary angiogram requiring IV nitroglycerin with up titration, IV beta-blockade, and intracoronary nitroglycerin to relieve.   RECOMMENDATIONS:   Admission IV nitroglycerin and IV heparin TCTS consultation for consideration of multivessel bypass.  Coronary Diagrams  Diagnostic Dominance: Co-dominant Intervention  Patient Profile     81 y.o. female patient of Dr. Harvie [name] with remote history of PTCA 1997, hyperlipidemia and carotid disease who was seen recently by him 11/27/2021 with progressive chest pain which is nitrate responsive and was referred for diagnostic  coronary angiography.  Assessment & Plan    1: CAD-cath yesterday revealed left main/three-vessel disease with preserved LV function.  CABG Monday with Dr Cliffton Asters Continue heparin   2: Hyperlipidemia-statin intolerant  3: Essential hypertension-on losartan low-dose and metoprolol low-dose.  For questions or updates, please contact [facility] Please consult [email or web address] for contact info under        Signed, Charlton Haws, MD  12/06/2021, 8:06 AM

## 2021-12-07 DIAGNOSIS — I200 Unstable angina: Secondary | ICD-10-CM | POA: Diagnosis not present

## 2021-12-07 DIAGNOSIS — I2510 Atherosclerotic heart disease of native coronary artery without angina pectoris: Secondary | ICD-10-CM | POA: Diagnosis not present

## 2021-12-07 LAB — BASIC METABOLIC PANEL
Anion gap: 7 (ref 5–15)
BUN: 10 mg/dL (ref 8–23)
CO2: 23 mmol/L (ref 22–32)
Calcium: 9.1 mg/dL (ref 8.9–10.3)
Chloride: 106 mmol/L (ref 98–111)
Creatinine, Ser: 0.94 mg/dL (ref 0.44–1.00)
GFR, Estimated: >60 mL/min (ref >60)
Glucose, Bld: 146 mg/dL — ABNORMAL HIGH (ref 70–99)
Potassium: 4.1 mmol/L (ref 3.5–5.1)
Sodium: 136 mmol/L (ref 135–145)

## 2021-12-07 LAB — GLUCOSE, CAPILLARY
Glucose-Capillary: 114 mg/dL — ABNORMAL HIGH (ref 70–99)
Glucose-Capillary: 151 mg/dL — ABNORMAL HIGH (ref 70–99)
Glucose-Capillary: 146 mg/dL — ABNORMAL HIGH (ref 70–99)

## 2021-12-07 LAB — CBC
HCT: 36.8 % (ref 36.0–46.0)
Hemoglobin: 12.1 g/dL (ref 12.0–15.0)
MCH: 30.4 pg (ref 26.0–34.0)
MCHC: 32.9 g/dL (ref 30.0–36.0)
MCV: 92.5 fL (ref 80.0–100.0)
Platelets: 198 10*3/uL (ref 150–400)
RBC: 3.98 MIL/uL (ref 3.87–5.11)
RDW: 15 % (ref 11.5–15.5)
WBC: 6.6 10*3/uL (ref 4.0–10.5)
nRBC: 0 % (ref 0.0–0.2)

## 2021-12-07 LAB — HEPARIN LEVEL (UNFRACTIONATED): Heparin Unfractionated: 0.5 IU/mL (ref 0.30–0.70)

## 2021-12-07 LAB — PREPARE RBC (CROSSMATCH)

## 2021-12-07 MED ORDER — CHLORHEXIDINE GLUCONATE CLOTH 2 % EX PADS
6.0000 | MEDICATED_PAD | Freq: Once | CUTANEOUS | Status: AC
  Administered 2021-12-08: 6 via TOPICAL

## 2021-12-07 MED ORDER — CHLORHEXIDINE GLUCONATE CLOTH 2 % EX PADS
6.0000 | MEDICATED_PAD | Freq: Once | CUTANEOUS | Status: AC
  Administered 2021-12-07: 6 via TOPICAL

## 2021-12-07 MED ORDER — CHLORHEXIDINE GLUCONATE 0.12 % MT SOLN: 15.0000 mL | Freq: Once | OROMUCOSAL | Status: AC

## 2021-12-07 MED ORDER — TEMAZEPAM 15 MG PO CAPS: 15.0000 mg | ORAL_CAPSULE | Freq: Once | ORAL | Status: DC | PRN

## 2021-12-07 MED ORDER — BISACODYL 5 MG PO TBEC: 5.0000 mg | DELAYED_RELEASE_TABLET | Freq: Once | ORAL | Status: DC

## 2021-12-07 MED ORDER — METOPROLOL TARTRATE 12.5 MG HALF TABLET
12.5000 mg | ORAL_TABLET | Freq: Once | ORAL | Status: AC
  Administered 2021-12-08: 12.5 mg via ORAL

## 2021-12-07 NOTE — Progress Notes (Signed)
ANTICOAGULATION CONSULT NOTE - Follow Up Consult  Pharmacy Consult for IV Heparin Indication: chest pain/ACS  Allergies  Allergen Reactions   Cholesterol     All cholesterol meds cause pain and aching in joints   Hydrocodone Nausea Only   Statins     Joint pain   Metformin And Related     Joint pain    Patient Measurements: Height: 5\' 2"  (157.5 cm) Weight: 67.2 kg (148 lb 2.4 oz) IBW/kg (Calculated) : 50.1 Heparin Dosing Weight: 63.8 kg  Vital Signs: Temp: 98.4 F (36.9 C) (01/22 0359) Temp Source: Oral (01/22 0359) BP: 141/63 (01/22 0359) Pulse Rate: 66 (01/22 0359)  Labs: Recent Labs    12/05/21 0154 12/05/21 0154 12/05/21 1056 12/06/21 0429 12/06/21 1048 12/07/21 0235  HGB 12.7  --   --  13.3  --  12.1  HCT 38.3  --   --  40.5  --  36.8  PLT 191  --   --  172  --  198  APTT  --   --   --   --  110*  --   LABPROT  --   --   --   --  14.4  --   INR  --   --   --   --  1.1  --   HEPARINUNFRC 0.55  --  0.59 0.65  --  0.50  CREATININE  --    < > 0.86 0.89 1.06* 0.94   < > = values in this interval not displayed.     Estimated Creatinine Clearance: 42.9 mL/min (by C-G formula based on SCr of 0.94 mg/dL).   Medical History: Past Medical History:  Diagnosis Date   CAD (coronary artery disease)    Catheterization 2004, 60% distal LAD, 80% ostial circumflex( not optimal for PCI), 70% small RCA, medical therapy  /  nuclear June, 2005, EF 65%, no ischemia   Carotid artery disease (HCC)    Doppler, November, 2011, no significant plaque, distal R. ICA velocities are elevated and could be source of bruit, 0-39% bilateral   Diabetes mellitus    Drug therapy    Intermittent steroid use   Dyslipidemia    Edema    Ejection fraction    EF 60%, echo, November, 2011, trivial pericardial effusion   Rheumatoid arthritis(714.0)    Hospitalization August, 2011, severe RA flare,    Statin intolerance     Medications:  Scheduled:   aspirin  81 mg Oral Daily    Chlorhexidine Gluconate Cloth  6 each Topical Daily   [START ON 12/08/2021] epinephrine  0-10 mcg/min Intravenous To OR   folic acid  1 mg Oral QPM   [START ON 12/08/2021] heparin-papaverine-plasmalyte irrigation   Irrigation To OR   insulin aspart  0-5 Units Subcutaneous QHS   insulin aspart  0-9 Units Subcutaneous TID WC   [START ON 12/08/2021] insulin   Intravenous To OR   [START ON 12/08/2021] [facility] Blood Cardioplegia vial (lidocaine/magnesium/mannitol 0.26g-4g-6.4g)   Intracoronary To OR   losartan  25 mg Oral Daily   metoprolol tartrate  12.5 mg Oral BID   [START ON 12/08/2021] phenylephrine  30-200 mcg/min Intravenous To OR   [START ON 12/08/2021] potassium chloride  80 mEq Other To OR   sodium chloride flush  3 mL Intravenous Q12H   [START ON 12/08/2021] tranexamic acid  15 mg/kg Intravenous To OR   [START ON 12/08/2021] tranexamic acid  2 mg/kg Intracatheter To OR   Infusions:  sodium chloride Stopped (12/04/21 1110)   [START ON 12/08/2021]  ceFAZolin (ANCEF) IV     [START ON 12/08/2021]  ceFAZolin (ANCEF) IV     [START ON 12/08/2021] dexmedetomidine     [START ON 12/08/2021] heparin 30,000 units/NS 1000 mL solution for CELLSAVER     heparin 850 Units/hr (12/07/21 0642)   [START ON 12/08/2021] milrinone     nitroGLYCERIN     [START ON 12/08/2021] nitroGLYCERIN     [START ON 12/08/2021] norepinephrine     [START ON 12/08/2021] tranexamic acid (CYKLOKAPRON) infusion ([facility])     [START ON 12/08/2021] vancomycin      Assessment: 81 years of age female status post catheterization with multivessel disease and TCTS consultation for consideration of bypass.   Heparin level remains therapeutic on 850 units/hr. No s/sx of bleeding or infusion issues reported. CBC within normal limits and stable.  Goal of Therapy:  Heparin level 0.3-0.7 units/ml Monitor platelets by anticoagulation protocol: Yes   Plan:  Continue IV Heparin to 850 units/hr Daily Heparin level and CBC while on therapy.    Marzella Schlein [place], Student Pharmacist   Please check AMION for all [facility] pharmacy phone numbers After 10:00 PM call main pharmacy [phone]

## 2021-12-07 NOTE — Progress Notes (Signed)
Progress Note  Patient Name: NEKESHA FONT Date of Encounter: 12/07/2021  [facility] Cardiologist: Kristeen Miss, MD   Subjective   No chest pain on heparin   Inpatient Medications    Scheduled Meds:  aspirin  81 mg Oral Daily   Chlorhexidine Gluconate Cloth  6 each Topical Daily   [START ON 12/08/2021] epinephrine  0-10 mcg/min Intravenous To OR   folic acid  1 mg Oral QPM   [START ON 12/08/2021] heparin-papaverine-plasmalyte irrigation   Irrigation To OR   insulin aspart  0-5 Units Subcutaneous QHS   insulin aspart  0-9 Units Subcutaneous TID WC   [START ON 12/08/2021] insulin   Intravenous To OR   [START ON 12/08/2021] [facility] Blood Cardioplegia vial (lidocaine/magnesium/mannitol 0.26g-4g-6.4g)   Intracoronary To OR   losartan  25 mg Oral Daily   metoprolol tartrate  12.5 mg Oral BID   [START ON 12/08/2021] phenylephrine  30-200 mcg/min Intravenous To OR   [START ON 12/08/2021] potassium chloride  80 mEq Other To OR   sodium chloride flush  3 mL Intravenous Q12H   [START ON 12/08/2021] tranexamic acid  15 mg/kg Intravenous To OR   [START ON 12/08/2021] tranexamic acid  2 mg/kg Intracatheter To OR   Continuous Infusions:  sodium chloride Stopped (12/04/21 1110)   [START ON 12/08/2021]  ceFAZolin (ANCEF) IV     [START ON 12/08/2021]  ceFAZolin (ANCEF) IV     [START ON 12/08/2021] dexmedetomidine     [START ON 12/08/2021] heparin 30,000 units/NS 1000 mL solution for CELLSAVER     heparin 850 Units/hr (12/07/21 0642)   [START ON 12/08/2021] milrinone     nitroGLYCERIN     [START ON 12/08/2021] nitroGLYCERIN     [START ON 12/08/2021] norepinephrine     [START ON 12/08/2021] tranexamic acid (CYKLOKAPRON) infusion ([facility])     [START ON 12/08/2021] vancomycin     PRN Meds: sodium chloride, acetaminophen, hydrALAZINE, nitroGLYCERIN, ondansetron (ZOFRAN) IV, sodium chloride flush   Vital Signs    Vitals:   12/06/21 1420 12/06/21 1532 12/06/21 2026 12/07/21 0359  BP: 124/65 (!)  143/57 (!) 119/55 (!) 141/63  Pulse: (!) 57 (!) 53 64 66  Resp: Temp: 98.5 F (36.9 C) 98.4 F (36.9 C) 98.7 F (37.1 C) 98.4 F (36.9 C)  TempSrc: Oral Oral Oral Oral  SpO2: 100% 100% 100% 100%  Weight:    67.2 kg  Height:        Intake/Output Summary (Last 24 hours) at 12/07/2021 0854 Last data filed at 12/07/2021 0400 Gross per 24 hour  Intake 1455.36 ml  Output 1250 ml  Net 205.36 ml   Last 3 Weights 12/07/2021 12/02/2021 11/27/2021  Weight (lbs) 148 lb 2.4 oz 147 lb 147 lb 3.2 oz  Weight (kg) 67.2 kg 66.679 kg 66.769 kg      Telemetry    Sinus rhythm with PVCs- Personally Reviewed  ECG    Not performed today- Personally Reviewed  Physical Exam   Affect appropriate Healthy:  appears stated age HEENT: normal Neck supple with no adenopathy JVP normal no bruits no thyromegaly Lungs clear with no wheezing and good diaphragmatic motion Heart:  S1/S2 no murmur, no rub, gallop or click PMI normal Abdomen: benighn, BS positve, no tenderness, no AAA no bruit.  No HSM or HJR Distal pulses intact with no bruits No edema Neuro non-focal Skin warm and dry No muscular weakness   Labs    High Sensitivity Troponin:  No results for input(s): TROPONINIHS in the last 720 hours.   Chemistry Recent Labs  Lab 12/04/21 0555 12/05/21 1056 12/06/21 0429 12/06/21 1048 12/07/21 0235  NA 141   < > 136 136 136  K 3.9   < > 4.2 4.4 4.1  CL 110   < > 109 104 106  CO2 24   < > 21* 26 23  GLUCOSE 122*   < > 107* 229* 146*  BUN 8   < > 8 8 10   CREATININE 0.86   < > 0.89 1.06* 0.94  CALCIUM 8.7*   < > 8.8* 9.0 9.1  MG 1.8  --   --   --   --   PROT  --   --   --  6.7  --   ALBUMIN  --   --   --  3.0*  --   AST  --   --   --  26  --   ALT  --   --   --  25  --   ALKPHOS  --   --   --  63  --   BILITOT  --   --   --  0.4  --   GFRNONAA >60   < > >60 53* >60  ANIONGAP 7   < > 6 6 7    < > = values in this interval not displayed.    Lipids  No results for  input(s): CHOL, TRIG, HDL, LABVLDL, LDLCALC, CHOLHDL in the last 168 hours.   Hematology Recent Labs  Lab 12/05/21 0154 12/06/21 0429 12/07/21 0235  WBC 6.4 6.6 6.6  RBC 4.15 4.38 3.98  HGB 12.7 13.3 12.1  HCT 38.3 40.5 36.8  MCV 92.3 92.5 92.5  MCH 30.6 30.4 30.4  MCHC 33.2 32.8 32.9  RDW 14.9 14.8 15.0  PLT 191 172 198   Thyroid No results for input(s): TSH, FREET4 in the last 168 hours.  BNPNo results for input(s): BNP, PROBNP in the last 168 hours.  DDimer No results for input(s): DDIMER in the last 168 hours.   Radiology    DG Chest 2 View  Result Date: 12/06/2021 CLINICAL DATA:  Preop evaluation, coronary artery disease EXAM: CHEST - 2 VIEW COMPARISON:  07/09/2021 FINDINGS: Transverse diameter of heart is increased. There are no signs of pulmonary edema or focal pulmonary consolidation. There is no significant pleural effusion or pneumothorax. Degenerative changes are noted in the left shoulder. IMPRESSION: No active cardiopulmonary disease. Electronically Signed   By: Ernie Avena M.D.   On: 12/06/2021 16:39    Cardiac Studies   2D echocardiogram (12/03/2021)   1. Left ventricular ejection fraction, by estimation, is 60 to 65%. The  left ventricle has normal function. The left ventricle has no regional  wall motion abnormalities. Left ventricular diastolic parameters were  normal.   2. Right ventricular systolic function is normal. The right ventricular  size is normal. There is normal pulmonary artery systolic pressure. The  estimated right ventricular systolic pressure is 23.5 mmHg.   3. The mitral valve is grossly normal. Trivial mitral valve  regurgitation. No evidence of mitral stenosis.   4. The aortic valve is tricuspid. Aortic valve regurgitation is not  visualized. No aortic stenosis is present.   5. The inferior vena cava is dilated in size with >50% respiratory  variability, suggesting right atrial pressure of 8 mmHg.    Cardiac catheterization  (12/02/2021)  CONCLUSIONS: 50% distal left main.  95% ostial LAD with TIMI-3 flow. 80% mid diagonal #2.  95% apical LAD. 30 to 50% proximal circumflex Codominant right coronary with large bifurcating acute marginal 95% and also 90 to 95% proximal RCA. Normal LV function.  Normal LVEDP. Prolonged pain after completing left coronary angiogram requiring IV nitroglycerin with up titration, IV beta-blockade, and intracoronary nitroglycerin to relieve.   RECOMMENDATIONS:   Admission IV nitroglycerin and IV heparin TCTS consultation for consideration of multivessel bypass.  Coronary Diagrams  Diagnostic Dominance: Co-dominant Intervention  Patient Profile     81 y.o. female patient of Dr. Harvie BridgeNahser's with remote history of PTCA 1997, hyperlipidemia and carotid disease who was seen recently by him 11/27/2021 with progressive chest pain which is nitrate responsive and was referred for diagnostic coronary angiography.  Assessment & Plan    1: CAD-cath revealed left main/three-vessel disease with preserved LV function.  CABG Monday with Dr Cliffton AstersLightfoot Continue heparin   2: Hyperlipidemia-statin intolerant  3: Essential hypertension-on losartan low-dose and metoprolol low-dose.  For questions or updates, please contact [facility] Please consult [email or web address] for contact info under        Signed, Charlton HawsPeter Iseah Plouff, MD  12/07/2021, 8:54 AM

## 2021-12-08 ENCOUNTER — Inpatient Hospital Stay (HOSPITAL_COMMUNITY): Payer: Medicare PPO | Admitting: Certified Registered Nurse Anesthetist

## 2021-12-08 ENCOUNTER — Inpatient Hospital Stay (HOSPITAL_COMMUNITY): Payer: Medicare PPO

## 2021-12-08 ENCOUNTER — Other Ambulatory Visit: Payer: Self-pay

## 2021-12-08 ENCOUNTER — Encounter (HOSPITAL_COMMUNITY): Payer: Self-pay | Admitting: Interventional Cardiology

## 2021-12-08 ENCOUNTER — Inpatient Hospital Stay (HOSPITAL_COMMUNITY)
Admission: RE | Disposition: A | Discharge status: Home Health | Payer: Self-pay | Source: Home / Self Care | Attending: Thoracic Surgery (Cardiothoracic Vascular Surgery)

## 2021-12-08 DIAGNOSIS — E119 Type 2 diabetes mellitus without complications: Secondary | ICD-10-CM | POA: Diagnosis not present

## 2021-12-08 DIAGNOSIS — Z951 Presence of aortocoronary bypass graft: Secondary | ICD-10-CM

## 2021-12-08 DIAGNOSIS — I779 Disorder of arteries and arterioles, unspecified: Secondary | ICD-10-CM

## 2021-12-08 DIAGNOSIS — I200 Unstable angina: Secondary | ICD-10-CM | POA: Diagnosis not present

## 2021-12-08 DIAGNOSIS — I2510 Atherosclerotic heart disease of native coronary artery without angina pectoris: Secondary | ICD-10-CM | POA: Diagnosis not present

## 2021-12-08 HISTORY — PX: ENDOVEIN HARVEST OF GREATER SAPHENOUS VEIN: SHX5059

## 2021-12-08 HISTORY — PX: CORONARY ARTERY BYPASS GRAFT: SHX141

## 2021-12-08 HISTORY — PX: TEE WITHOUT CARDIOVERSION: SHX5443

## 2021-12-08 LAB — POCT I-STAT 7, (LYTES, BLD GAS, ICA,H+H)
Acid-Base Excess: 1 mmol/L (ref 0.0–2.0)
Acid-Base Excess: 1 mmol/L (ref 0.0–2.0)
Acid-Base Excess: 0 mmol/L (ref 0.0–2.0)
Acid-Base Excess: 1 mmol/L (ref 0.0–2.0)
Acid-base deficit: 1 mmol/L (ref 0.0–2.0)
Acid-base deficit: 3 mmol/L — ABNORMAL HIGH (ref 0.0–2.0)
Acid-base deficit: 3 mmol/L — ABNORMAL HIGH (ref 0.0–2.0)
Acid-base deficit: 5 mmol/L — ABNORMAL HIGH (ref 0.0–2.0)
Bicarbonate: 26.9 mmol/L (ref 20.0–28.0)
Bicarbonate: 24.9 mmol/L (ref 20.0–28.0)
Bicarbonate: 23.6 mmol/L (ref 20.0–28.0)
Bicarbonate: 25.5 mmol/L (ref 20.0–28.0)
Bicarbonate: 22.3 mmol/L (ref 20.0–28.0)
Bicarbonate: 23.2 mmol/L (ref 20.0–28.0)
Bicarbonate: 21.4 mmol/L (ref 20.0–28.0)
Bicarbonate: 20.4 mmol/L (ref 20.0–28.0)
Calcium, Ion: 1.27 mmol/L (ref 1.15–1.40)
Calcium, Ion: 0.92 mmol/L — ABNORMAL LOW (ref 1.15–1.40)
Calcium, Ion: 0.91 mmol/L — ABNORMAL LOW (ref 1.15–1.40)
Calcium, Ion: 0.96 mmol/L — ABNORMAL LOW (ref 1.15–1.40)
Calcium, Ion: 1.05 mmol/L — ABNORMAL LOW (ref 1.15–1.40)
Calcium, Ion: 1.1 mmol/L — ABNORMAL LOW (ref 1.15–1.40)
Calcium, Ion: 1.04 mmol/L — ABNORMAL LOW (ref 1.15–1.40)
Calcium, Ion: 1.05 mmol/L — ABNORMAL LOW (ref 1.15–1.40)
HCT: 36 % (ref 36.0–46.0)
HCT: 22 % — ABNORMAL LOW (ref 36.0–46.0)
HCT: 20 % — ABNORMAL LOW (ref 36.0–46.0)
HCT: 21 % — ABNORMAL LOW (ref 36.0–46.0)
HCT: 20 % — ABNORMAL LOW (ref 36.0–46.0)
HCT: 20 % — ABNORMAL LOW (ref 36.0–46.0)
HCT: 25 % — ABNORMAL LOW (ref 36.0–46.0)
HCT: 25 % — ABNORMAL LOW (ref 36.0–46.0)
Hemoglobin: 12.2 g/dL (ref 12.0–15.0)
Hemoglobin: 7.5 g/dL — ABNORMAL LOW (ref 12.0–15.0)
Hemoglobin: 6.8 g/dL — CL (ref 12.0–15.0)
Hemoglobin: 7.1 g/dL — ABNORMAL LOW (ref 12.0–15.0)
Hemoglobin: 6.8 g/dL — CL (ref 12.0–15.0)
Hemoglobin: 6.8 g/dL — CL (ref 12.0–15.0)
Hemoglobin: 8.5 g/dL — ABNORMAL LOW (ref 12.0–15.0)
Hemoglobin: 8.5 g/dL — ABNORMAL LOW (ref 12.0–15.0)
O2 Saturation: 100 %
O2 Saturation: 100 %
O2 Saturation: 100 %
O2 Saturation: 100 %
O2 Saturation: 100 %
O2 Saturation: 100 %
O2 Saturation: 100 %
O2 Saturation: 98 %
Patient temperature: 35.5
Potassium: 4 mmol/L (ref 3.5–5.1)
Potassium: 4 mmol/L (ref 3.5–5.1)
Potassium: 4.4 mmol/L (ref 3.5–5.1)
Potassium: 4.9 mmol/L (ref 3.5–5.1)
Potassium: 3.9 mmol/L (ref 3.5–5.1)
Potassium: 4.3 mmol/L (ref 3.5–5.1)
Potassium: 4.1 mmol/L (ref 3.5–5.1)
Potassium: 3.9 mmol/L (ref 3.5–5.1)
Sodium: 140 mmol/L (ref 135–145)
Sodium: 138 mmol/L (ref 135–145)
Sodium: 137 mmol/L (ref 135–145)
Sodium: 138 mmol/L (ref 135–145)
Sodium: 135 mmol/L (ref 135–145)
Sodium: 138 mmol/L (ref 135–145)
Sodium: 139 mmol/L (ref 135–145)
Sodium: 141 mmol/L (ref 135–145)
TCO2: 28 mmol/L (ref 22–32)
TCO2: 26 mmol/L (ref 22–32)
TCO2: 25 mmol/L (ref 22–32)
TCO2: 27 mmol/L (ref 22–32)
TCO2: 23 mmol/L (ref 22–32)
TCO2: 24 mmol/L (ref 22–32)
TCO2: 22 mmol/L (ref 22–32)
TCO2: 21 mmol/L — ABNORMAL LOW (ref 22–32)
pCO2 arterial: 45.9 mmHg (ref 32.0–48.0)
pCO2 arterial: 36.8 mmHg (ref 32.0–48.0)
pCO2 arterial: 33.6 mmHg (ref 32.0–48.0)
pCO2 arterial: 37.6 mmHg (ref 32.0–48.0)
pCO2 arterial: 35.4 mmHg (ref 32.0–48.0)
pCO2 arterial: 39.4 mmHg (ref 32.0–48.0)
pCO2 arterial: 33.8 mmHg (ref 32.0–48.0)
pCO2 arterial: 33.3 mmHg (ref 32.0–48.0)
pH, Arterial: 7.375 (ref 7.350–7.450)
pH, Arterial: 7.439 (ref 7.350–7.450)
pH, Arterial: 7.44 (ref 7.350–7.450)
pH, Arterial: 7.455 — ABNORMAL HIGH (ref 7.350–7.450)
pH, Arterial: 7.36 (ref 7.350–7.450)
pH, Arterial: 7.425 (ref 7.350–7.450)
pH, Arterial: 7.41 (ref 7.350–7.450)
pH, Arterial: 7.388 (ref 7.350–7.450)
pO2, Arterial: 540 mmHg — ABNORMAL HIGH (ref 83.0–108.0)
pO2, Arterial: 424 mmHg — ABNORMAL HIGH (ref 83.0–108.0)
pO2, Arterial: 239 mmHg — ABNORMAL HIGH (ref 83.0–108.0)
pO2, Arterial: 304 mmHg — ABNORMAL HIGH (ref 83.0–108.0)
pO2, Arterial: 349 mmHg — ABNORMAL HIGH (ref 83.0–108.0)
pO2, Arterial: 521 mmHg — ABNORMAL HIGH (ref 83.0–108.0)
pO2, Arterial: 319 mmHg — ABNORMAL HIGH (ref 83.0–108.0)
pO2, Arterial: 97 mmHg (ref 83.0–108.0)

## 2021-12-08 LAB — CBC
HCT: 39 % (ref 36.0–46.0)
HCT: 24.8 % — ABNORMAL LOW (ref 36.0–46.0)
Hemoglobin: 12.5 g/dL (ref 12.0–15.0)
Hemoglobin: 8.5 g/dL — ABNORMAL LOW (ref 12.0–15.0)
MCH: 29.7 pg (ref 26.0–34.0)
MCH: 31.8 pg (ref 26.0–34.0)
MCHC: 32.1 g/dL (ref 30.0–36.0)
MCHC: 34.3 g/dL (ref 30.0–36.0)
MCV: 92.6 fL (ref 80.0–100.0)
MCV: 92.9 fL (ref 80.0–100.0)
Platelets: 203 10*3/uL (ref 150–400)
Platelets: 107 10*3/uL — ABNORMAL LOW (ref 150–400)
RBC: 4.21 MIL/uL (ref 3.87–5.11)
RBC: 2.67 MIL/uL — ABNORMAL LOW (ref 3.87–5.11)
RDW: 15.1 % (ref 11.5–15.5)
RDW: 15 % (ref 11.5–15.5)
WBC: 6.8 10*3/uL (ref 4.0–10.5)
WBC: 7.1 10*3/uL (ref 4.0–10.5)
nRBC: 0 % (ref 0.0–0.2)
nRBC: 0 % (ref 0.0–0.2)

## 2021-12-08 LAB — POCT I-STAT, CHEM 8
BUN: 9 mg/dL (ref 8–23)
BUN: 9 mg/dL (ref 8–23)
BUN: 7 mg/dL — ABNORMAL LOW (ref 8–23)
BUN: 6 mg/dL — ABNORMAL LOW (ref 8–23)
BUN: 6 mg/dL — ABNORMAL LOW (ref 8–23)
Calcium, Ion: 1.27 mmol/L (ref 1.15–1.40)
Calcium, Ion: 1.23 mmol/L (ref 1.15–1.40)
Calcium, Ion: 0.9 mmol/L — ABNORMAL LOW (ref 1.15–1.40)
Calcium, Ion: 1.04 mmol/L — ABNORMAL LOW (ref 1.15–1.40)
Calcium, Ion: 1.04 mmol/L — ABNORMAL LOW (ref 1.15–1.40)
Chloride: 105 mmol/L (ref 98–111)
Chloride: 104 mmol/L (ref 98–111)
Chloride: 101 mmol/L (ref 98–111)
Chloride: 103 mmol/L (ref 98–111)
Chloride: 105 mmol/L (ref 98–111)
Creatinine, Ser: 0.6 mg/dL (ref 0.44–1.00)
Creatinine, Ser: 0.6 mg/dL (ref 0.44–1.00)
Creatinine, Ser: 0.4 mg/dL — ABNORMAL LOW (ref 0.44–1.00)
Creatinine, Ser: 0.5 mg/dL (ref 0.44–1.00)
Creatinine, Ser: 0.4 mg/dL — ABNORMAL LOW (ref 0.44–1.00)
Glucose, Bld: 125 mg/dL — ABNORMAL HIGH (ref 70–99)
Glucose, Bld: 130 mg/dL — ABNORMAL HIGH (ref 70–99)
Glucose, Bld: 93 mg/dL (ref 70–99)
Glucose, Bld: 143 mg/dL — ABNORMAL HIGH (ref 70–99)
Glucose, Bld: 137 mg/dL — ABNORMAL HIGH (ref 70–99)
HCT: 38 % (ref 36.0–46.0)
HCT: 35 % — ABNORMAL LOW (ref 36.0–46.0)
HCT: 21 % — ABNORMAL LOW (ref 36.0–46.0)
HCT: 20 % — ABNORMAL LOW (ref 36.0–46.0)
HCT: 21 % — ABNORMAL LOW (ref 36.0–46.0)
Hemoglobin: 12.9 g/dL (ref 12.0–15.0)
Hemoglobin: 11.9 g/dL — ABNORMAL LOW (ref 12.0–15.0)
Hemoglobin: 7.1 g/dL — ABNORMAL LOW (ref 12.0–15.0)
Hemoglobin: 6.8 g/dL — CL (ref 12.0–15.0)
Hemoglobin: 7.1 g/dL — ABNORMAL LOW (ref 12.0–15.0)
Potassium: 4 mmol/L (ref 3.5–5.1)
Potassium: 3.7 mmol/L (ref 3.5–5.1)
Potassium: 4.4 mmol/L (ref 3.5–5.1)
Potassium: 3.9 mmol/L (ref 3.5–5.1)
Potassium: 4.1 mmol/L (ref 3.5–5.1)
Sodium: 141 mmol/L (ref 135–145)
Sodium: 138 mmol/L (ref 135–145)
Sodium: 137 mmol/L (ref 135–145)
Sodium: 139 mmol/L (ref 135–145)
Sodium: 139 mmol/L (ref 135–145)
TCO2: 26 mmol/L (ref 22–32)
TCO2: 25 mmol/L (ref 22–32)
TCO2: 24 mmol/L (ref 22–32)
TCO2: 25 mmol/L (ref 22–32)
TCO2: 24 mmol/L (ref 22–32)

## 2021-12-08 LAB — POCT I-STAT EG7
Acid-base deficit: 2 mmol/L (ref 0.0–2.0)
Bicarbonate: 22.4 mmol/L (ref 20.0–28.0)
Calcium, Ion: 0.94 mmol/L — ABNORMAL LOW (ref 1.15–1.40)
HCT: 23 % — ABNORMAL LOW (ref 36.0–46.0)
Hemoglobin: 7.8 g/dL — ABNORMAL LOW (ref 12.0–15.0)
O2 Saturation: 93 %
Potassium: 3.9 mmol/L (ref 3.5–5.1)
Sodium: 137 mmol/L (ref 135–145)
TCO2: 23 mmol/L (ref 22–32)
pCO2, Ven: 34.8 mmHg — ABNORMAL LOW (ref 44.0–60.0)
pH, Ven: 7.417 (ref 7.250–7.430)
pO2, Ven: 64 mmHg — ABNORMAL HIGH (ref 32.0–45.0)

## 2021-12-08 LAB — BASIC METABOLIC PANEL
Anion gap: 6 (ref 5–15)
BUN: 14 mg/dL (ref 8–23)
CO2: 25 mmol/L (ref 22–32)
Calcium: 9.1 mg/dL (ref 8.9–10.3)
Chloride: 107 mmol/L (ref 98–111)
Creatinine, Ser: 0.9 mg/dL (ref 0.44–1.00)
GFR, Estimated: >60 mL/min (ref >60)
Glucose, Bld: 153 mg/dL — ABNORMAL HIGH (ref 70–99)
Potassium: 3.6 mmol/L (ref 3.5–5.1)
Sodium: 138 mmol/L (ref 135–145)

## 2021-12-08 LAB — HEPARIN LEVEL (UNFRACTIONATED): Heparin Unfractionated: 0.57 IU/mL (ref 0.30–0.70)

## 2021-12-08 LAB — HEMOGLOBIN AND HEMATOCRIT, BLOOD
HCT: 18.1 % — ABNORMAL LOW (ref 36.0–46.0)
Hemoglobin: 6.2 g/dL — CL (ref 12.0–15.0)

## 2021-12-08 LAB — GLUCOSE, CAPILLARY
Glucose-Capillary: 159 mg/dL — ABNORMAL HIGH (ref 70–99)
Glucose-Capillary: 98 mg/dL (ref 70–99)
Glucose-Capillary: 116 mg/dL — ABNORMAL HIGH (ref 70–99)
Glucose-Capillary: 127 mg/dL — ABNORMAL HIGH (ref 70–99)
Glucose-Capillary: 139 mg/dL — ABNORMAL HIGH (ref 70–99)

## 2021-12-08 LAB — APTT: aPTT: 52 seconds — ABNORMAL HIGH (ref 24–36)

## 2021-12-08 LAB — PROTIME-INR
INR: 2.1 — ABNORMAL HIGH (ref 0.8–1.2)
Prothrombin Time: 23.7 seconds — ABNORMAL HIGH (ref 11.4–15.2)

## 2021-12-08 LAB — PLATELET COUNT: Platelets: 105 10*3/uL — ABNORMAL LOW (ref 150–400)

## 2021-12-08 LAB — FIBRINOGEN: Fibrinogen: 161 mg/dL — ABNORMAL LOW (ref 210–475)

## 2021-12-08 LAB — PREPARE RBC (CROSSMATCH)

## 2021-12-08 SURGERY — CORONARY ARTERY BYPASS GRAFTING (CABG)
Anesthesia: General | Site: Leg Upper | Laterality: Right

## 2021-12-08 MED ORDER — PROPOFOL 10 MG/ML IV BOLUS
INTRAVENOUS | Status: DC | PRN
Start: 2021-12-08 — End: 2021-12-08
  Administered 2021-12-08: 90 mg via INTRAVENOUS

## 2021-12-08 MED ORDER — ACETAMINOPHEN 160 MG/5ML PO SOLN: 650.0000 mg | Freq: Once | ORAL | Status: AC

## 2021-12-08 MED ORDER — SODIUM CHLORIDE 0.9 % IV SOLN: INTRAVENOUS | Status: DC

## 2021-12-08 MED ORDER — PROPOFOL 10 MG/ML IV BOLUS
INTRAVENOUS | Status: AC
  Filled 2021-12-08: qty 20

## 2021-12-08 MED ORDER — ARTIFICIAL TEARS OPHTHALMIC OINT
TOPICAL_OINTMENT | OPHTHALMIC | Status: AC
  Filled 2021-12-08: qty 3.5

## 2021-12-08 MED ORDER — MORPHINE SULFATE (PF) 2 MG/ML IV SOLN
1.0000 mg | INTRAVENOUS | Status: DC | PRN
  Administered 2021-12-08 – 2021-12-10 (×8): 2 mg via INTRAVENOUS
  Administered 2021-12-10: 15:00:00 4 mg via INTRAVENOUS
  Administered 2021-12-14 (×2): 2 mg via INTRAVENOUS
  Administered 2021-12-14: 4 mg via INTRAVENOUS
  Filled 2021-12-08: qty 2
  Filled 2021-12-08 (×10): qty 1
  Filled 2021-12-08: qty 2
  Filled 2021-12-08: qty 1

## 2021-12-08 MED ORDER — MIDAZOLAM HCL (PF) 10 MG/2ML IJ SOLN
INTRAMUSCULAR | Status: AC
  Filled 2021-12-08: qty 2

## 2021-12-08 MED ORDER — ACETAMINOPHEN 160 MG/5ML PO SOLN
1000.0000 mg | Freq: Four times a day (QID) | ORAL | Status: AC
  Administered 2021-12-09 – 2021-12-10 (×6): 1000 mg
  Filled 2021-12-08 (×6): qty 40.6

## 2021-12-08 MED ORDER — METOPROLOL TARTRATE 5 MG/5ML IV SOLN: 2.5000 mg | INTRAVENOUS | Status: DC | PRN

## 2021-12-08 MED ORDER — PHENYLEPHRINE 40 MCG/ML (10ML) SYRINGE FOR IV PUSH (FOR BLOOD PRESSURE SUPPORT)
PREFILLED_SYRINGE | INTRAVENOUS | Status: AC
  Filled 2021-12-08: qty 10

## 2021-12-08 MED ORDER — MILRINONE LACTATE IN DEXTROSE 20-5 MG/100ML-% IV SOLN
0.3000 ug/kg/min | INTRAVENOUS | Status: DC
  Administered 2021-12-09 – 2021-12-10 (×2): 0.3 ug/kg/min via INTRAVENOUS
  Filled 2021-12-08 (×2): qty 100

## 2021-12-08 MED ORDER — EPHEDRINE 5 MG/ML INJ
INTRAVENOUS | Status: AC
  Filled 2021-12-08: qty 5

## 2021-12-08 MED ORDER — CHLORHEXIDINE GLUCONATE CLOTH 2 % EX PADS
6.0000 | MEDICATED_PAD | Freq: Every day | CUTANEOUS | Status: DC
  Administered 2021-12-08 – 2021-12-15 (×8): 6 via TOPICAL

## 2021-12-08 MED ORDER — CHLORHEXIDINE GLUCONATE 0.12 % MT SOLN: 15.0000 mL | Freq: Once | OROMUCOSAL | Status: DC

## 2021-12-08 MED ORDER — CEFAZOLIN SODIUM-DEXTROSE 2-4 GM/100ML-% IV SOLN
2.0000 g | Freq: Three times a day (TID) | INTRAVENOUS | Status: AC
  Administered 2021-12-08 – 2021-12-10 (×6): 2 g via INTRAVENOUS
  Filled 2021-12-08 (×6): qty 100

## 2021-12-08 MED ORDER — HEMOSTATIC AGENTS (NO CHARGE) OPTIME
TOPICAL | Status: DC | PRN
  Administered 2021-12-08 (×2): 1 via TOPICAL

## 2021-12-08 MED ORDER — POTASSIUM CHLORIDE 10 MEQ/50ML IV SOLN: 10.0000 meq | INTRAVENOUS | Status: AC

## 2021-12-08 MED ORDER — PANTOPRAZOLE SODIUM 40 MG PO TBEC
40.0000 mg | DELAYED_RELEASE_TABLET | Freq: Every day | ORAL | Status: DC
  Administered 2021-12-10: 09:00:00 40 mg via ORAL
  Filled 2021-12-08: qty 1

## 2021-12-08 MED ORDER — ATORVASTATIN CALCIUM 10 MG PO TABS: 10.0000 mg | ORAL_TABLET | Freq: Every day | ORAL | Status: DC

## 2021-12-08 MED ORDER — ORAL CARE MOUTH RINSE: 15.0000 mL | Freq: Once | OROMUCOSAL | Status: DC

## 2021-12-08 MED ORDER — BISACODYL 5 MG PO TBEC
10.0000 mg | DELAYED_RELEASE_TABLET | Freq: Every day | ORAL | Status: DC
  Administered 2021-12-10 – 2021-12-15 (×6): 10 mg via ORAL
  Filled 2021-12-08 (×6): qty 2

## 2021-12-08 MED ORDER — EPHEDRINE SULFATE-NACL 50-0.9 MG/10ML-% IV SOSY
PREFILLED_SYRINGE | INTRAVENOUS | Status: DC | PRN
  Administered 2021-12-08: 5 mg via INTRAVENOUS

## 2021-12-08 MED ORDER — DEXTROSE 50 % IV SOLN: 0.0000 mL | INTRAVENOUS | Status: DC | PRN

## 2021-12-08 MED ORDER — PLASMA-LYTE A IV SOLN
INTRAVENOUS | Status: DC | PRN
  Administered 2021-12-08: 1000 mL

## 2021-12-08 MED ORDER — DOBUTAMINE IN D5W 4-5 MG/ML-% IV SOLN
2.5000 ug/kg/min | INTRAVENOUS | Status: DC
  Filled 2021-12-08: qty 250

## 2021-12-08 MED ORDER — FENTANYL CITRATE (PF) 250 MCG/5ML IJ SOLN
INTRAMUSCULAR | Status: AC
  Filled 2021-12-08: qty 5

## 2021-12-08 MED ORDER — METOPROLOL TARTRATE 12.5 MG HALF TABLET: 12.5000 mg | ORAL_TABLET | Freq: Two times a day (BID) | ORAL | Status: DC

## 2021-12-08 MED ORDER — FOLIC ACID 1 MG PO TABS
1.0000 mg | ORAL_TABLET | Freq: Every evening | ORAL | Status: DC
  Administered 2021-12-09 – 2021-12-15 (×6): 1 mg via ORAL
  Filled 2021-12-08 (×6): qty 1

## 2021-12-08 MED ORDER — VASOPRESSIN 20 UNITS/100 ML INFUSION FOR SHOCK
0.0000 [IU]/min | INTRAVENOUS | Status: DC
  Filled 2021-12-08: qty 100

## 2021-12-08 MED ORDER — INSULIN REGULAR(HUMAN) IN NACL 100-0.9 UT/100ML-% IV SOLN: INTRAVENOUS | Status: DC

## 2021-12-08 MED ORDER — GLYCOPYRROLATE PF 0.2 MG/ML IJ SOSY
PREFILLED_SYRINGE | INTRAMUSCULAR | Status: AC
  Filled 2021-12-08: qty 1

## 2021-12-08 MED ORDER — SODIUM CHLORIDE 0.9% IV SOLUTION: Freq: Once | INTRAVENOUS | Status: DC

## 2021-12-08 MED ORDER — ACETAMINOPHEN 500 MG PO TABS
1000.0000 mg | ORAL_TABLET | Freq: Four times a day (QID) | ORAL | Status: AC
  Administered 2021-12-11 – 2021-12-13 (×5): 1000 mg via ORAL
  Filled 2021-12-08 (×7): qty 2

## 2021-12-08 MED ORDER — ALBUMIN HUMAN 5 % IV SOLN: INTRAVENOUS | Status: DC | PRN

## 2021-12-08 MED ORDER — LACTATED RINGERS IV SOLN: INTRAVENOUS | Status: DC

## 2021-12-08 MED ORDER — OXYCODONE HCL 5 MG PO TABS
5.0000 mg | ORAL_TABLET | ORAL | Status: DC | PRN
  Administered 2021-12-10 – 2021-12-12 (×7): 10 mg via ORAL
  Administered 2021-12-12: 5 mg via ORAL
  Administered 2021-12-12 – 2021-12-13 (×2): 10 mg via ORAL
  Administered 2021-12-13: 5 mg via ORAL
  Administered 2021-12-14 – 2021-12-16 (×10): 10 mg via ORAL
  Filled 2021-12-08 (×6): qty 2
  Filled 2021-12-08: qty 1
  Filled 2021-12-08 (×6): qty 2
  Filled 2021-12-08: qty 1
  Filled 2021-12-08 (×7): qty 2

## 2021-12-08 MED ORDER — DOCUSATE SODIUM 100 MG PO CAPS: 200.0000 mg | ORAL_CAPSULE | Freq: Every day | ORAL | Status: DC

## 2021-12-08 MED ORDER — LACTATED RINGERS IV SOLN: INTRAVENOUS | Status: DC | PRN

## 2021-12-08 MED ORDER — SODIUM CHLORIDE 0.9% FLUSH: 3.0000 mL | INTRAVENOUS | Status: DC | PRN

## 2021-12-08 MED ORDER — PROTAMINE SULFATE 10 MG/ML IV SOLN
INTRAVENOUS | Status: AC
  Filled 2021-12-08: qty 25

## 2021-12-08 MED ORDER — ONDANSETRON HCL 4 MG/2ML IJ SOLN
4.0000 mg | Freq: Four times a day (QID) | INTRAMUSCULAR | Status: DC | PRN
  Administered 2021-12-09 – 2021-12-12 (×3): 4 mg via INTRAVENOUS
  Filled 2021-12-08 (×3): qty 2

## 2021-12-08 MED ORDER — ROCURONIUM BROMIDE 10 MG/ML (PF) SYRINGE
PREFILLED_SYRINGE | INTRAVENOUS | Status: DC | PRN
  Administered 2021-12-08 (×2): 50 mg via INTRAVENOUS
  Administered 2021-12-08: 30 mg via INTRAVENOUS
  Administered 2021-12-08: 50 mg via INTRAVENOUS

## 2021-12-08 MED ORDER — ASPIRIN EC 325 MG PO TBEC
325.0000 mg | DELAYED_RELEASE_TABLET | Freq: Every day | ORAL | Status: DC
  Administered 2021-12-12 – 2021-12-15 (×4): 325 mg via ORAL
  Filled 2021-12-08 (×6): qty 1

## 2021-12-08 MED ORDER — ROCURONIUM BROMIDE 10 MG/ML (PF) SYRINGE
PREFILLED_SYRINGE | INTRAVENOUS | Status: AC
  Filled 2021-12-08: qty 10

## 2021-12-08 MED ORDER — DOPAMINE-DEXTROSE 3.2-5 MG/ML-% IV SOLN: 3.0000 ug/kg/min | INTRAVENOUS | Status: DC

## 2021-12-08 MED ORDER — LACTATED RINGERS IV SOLN
500.0000 mL | Freq: Once | INTRAVENOUS | Status: AC | PRN
  Administered 2021-12-09: 06:00:00 500 mL via INTRAVENOUS

## 2021-12-08 MED ORDER — DEXMEDETOMIDINE HCL IN NACL 400 MCG/100ML IV SOLN
0.0000 ug/kg/h | INTRAVENOUS | Status: DC
  Administered 2021-12-09: 11:00:00 0.7 ug/kg/h via INTRAVENOUS
  Administered 2021-12-09: 03:00:00 0.6 ug/kg/h via INTRAVENOUS
  Filled 2021-12-08 (×2): qty 100

## 2021-12-08 MED ORDER — HEPARIN SODIUM (PORCINE) 1000 UNIT/ML IJ SOLN
INTRAMUSCULAR | Status: DC | PRN
  Administered 2021-12-08 (×2): 24000 [IU] via INTRAVENOUS

## 2021-12-08 MED ORDER — ALBUMIN HUMAN 5 % IV SOLN
250.0000 mL | INTRAVENOUS | Status: AC | PRN
  Administered 2021-12-08 – 2021-12-09 (×4): 12.5 g via INTRAVENOUS
  Filled 2021-12-08 (×2): qty 250

## 2021-12-08 MED ORDER — BISACODYL 10 MG RE SUPP
10.0000 mg | Freq: Every day | RECTAL | Status: DC
  Administered 2021-12-09: 19:00:00 10 mg via RECTAL
  Filled 2021-12-08: qty 1

## 2021-12-08 MED ORDER — SODIUM CHLORIDE 0.9 % IV SOLN: 250.0000 mL | INTRAVENOUS | Status: DC

## 2021-12-08 MED ORDER — VANCOMYCIN HCL IN DEXTROSE 1-5 GM/200ML-% IV SOLN
1000.0000 mg | Freq: Once | INTRAVENOUS | Status: AC
  Administered 2021-12-09: 02:00:00 1000 mg via INTRAVENOUS
  Filled 2021-12-08: qty 200

## 2021-12-08 MED ORDER — METOPROLOL TARTRATE 25 MG/10 ML ORAL SUSPENSION: 12.5000 mg | Freq: Two times a day (BID) | ORAL | Status: DC

## 2021-12-08 MED ORDER — MIDAZOLAM HCL 5 MG/5ML IJ SOLN
INTRAMUSCULAR | Status: DC | PRN
  Administered 2021-12-08 (×2): 1 mg via INTRAVENOUS
  Administered 2021-12-08 (×2): .5 mg via INTRAVENOUS
  Administered 2021-12-08: 4 mg via INTRAVENOUS
  Administered 2021-12-08: 3 mg via INTRAVENOUS

## 2021-12-08 MED ORDER — ACETAMINOPHEN 650 MG RE SUPP
650.0000 mg | Freq: Once | RECTAL | Status: AC
  Administered 2021-12-08: 650 mg via RECTAL

## 2021-12-08 MED ORDER — SODIUM CHLORIDE (PF) 0.9 % IJ SOLN
INTRAMUSCULAR | Status: AC
  Filled 2021-12-08: qty 10

## 2021-12-08 MED ORDER — NITROGLYCERIN IN D5W 200-5 MCG/ML-% IV SOLN: 0.0000 ug/min | INTRAVENOUS | Status: DC

## 2021-12-08 MED ORDER — GLYCOPYRROLATE PF 0.2 MG/ML IJ SOSY
PREFILLED_SYRINGE | INTRAMUSCULAR | Status: DC | PRN
  Administered 2021-12-08: .2 mg via INTRAVENOUS

## 2021-12-08 MED ORDER — NOREPINEPHRINE 4 MG/250ML-% IV SOLN
0.0000 ug/min | INTRAVENOUS | Status: DC
  Administered 2021-12-09 (×2): 10 ug/min via INTRAVENOUS
  Administered 2021-12-09: 12:00:00 12 ug/min via INTRAVENOUS
  Administered 2021-12-10: 08:00:00 6 ug/min via INTRAVENOUS
  Filled 2021-12-08 (×5): qty 250

## 2021-12-08 MED ORDER — SODIUM CHLORIDE 0.45 % IV SOLN: INTRAVENOUS | Status: DC | PRN

## 2021-12-08 MED ORDER — LIDOCAINE 2% (20 MG/ML) 5 ML SYRINGE
INTRAMUSCULAR | Status: DC | PRN
  Administered 2021-12-08: 100 mg via INTRAVENOUS

## 2021-12-08 MED ORDER — FAMOTIDINE IN NACL 20-0.9 MG/50ML-% IV SOLN
20.0000 mg | Freq: Two times a day (BID) | INTRAVENOUS | Status: AC
  Administered 2021-12-08 – 2021-12-09 (×2): 20 mg via INTRAVENOUS
  Filled 2021-12-08 (×2): qty 50

## 2021-12-08 MED ORDER — TRAMADOL HCL 50 MG PO TABS
50.0000 mg | ORAL_TABLET | ORAL | Status: DC | PRN
  Administered 2021-12-10 – 2021-12-14 (×2): 100 mg via ORAL
  Filled 2021-12-08 (×2): qty 2

## 2021-12-08 MED ORDER — ARTIFICIAL TEARS OPHTHALMIC OINT
TOPICAL_OINTMENT | OPHTHALMIC | Status: DC | PRN
  Administered 2021-12-08: 1 via OPHTHALMIC

## 2021-12-08 MED ORDER — HEPARIN SODIUM (PORCINE) 1000 UNIT/ML IJ SOLN
INTRAMUSCULAR | Status: AC
  Filled 2021-12-08: qty 1

## 2021-12-08 MED ORDER — ASPIRIN 81 MG PO CHEW
324.0000 mg | CHEWABLE_TABLET | Freq: Every day | ORAL | Status: DC
  Administered 2021-12-09 – 2021-12-11 (×3): 324 mg
  Filled 2021-12-08 (×3): qty 4

## 2021-12-08 MED ORDER — CHLORHEXIDINE GLUCONATE 0.12 % MT SOLN
15.0000 mL | OROMUCOSAL | Status: AC
  Administered 2021-12-08: 15 mL via OROMUCOSAL

## 2021-12-08 MED ORDER — FENTANYL CITRATE (PF) 250 MCG/5ML IJ SOLN
INTRAMUSCULAR | Status: DC | PRN
  Administered 2021-12-08: 50 ug via INTRAVENOUS
  Administered 2021-12-08: 200 ug via INTRAVENOUS
  Administered 2021-12-08 (×2): 50 ug via INTRAVENOUS
  Administered 2021-12-08: 25 ug via INTRAVENOUS
  Administered 2021-12-08 (×2): 50 ug via INTRAVENOUS
  Administered 2021-12-08: 100 ug via INTRAVENOUS
  Administered 2021-12-08: 50 ug via INTRAVENOUS
  Administered 2021-12-08: 25 ug via INTRAVENOUS
  Administered 2021-12-08 (×2): 50 ug via INTRAVENOUS
  Administered 2021-12-08: 100 ug via INTRAVENOUS

## 2021-12-08 MED ORDER — CHLORHEXIDINE GLUCONATE 0.12 % MT SOLN
OROMUCOSAL | Status: AC
  Administered 2021-12-08: 15 mL via OROMUCOSAL
  Filled 2021-12-08: qty 15

## 2021-12-08 MED ORDER — LACTATED RINGERS IV SOLN
INTRAVENOUS | Status: DC | PRN
Start: 2021-12-08 — End: 2021-12-08

## 2021-12-08 MED ORDER — SODIUM CHLORIDE 0.9 % IV SOLN
INTRAVENOUS | Status: DC | PRN
  Administered 2021-12-08: 3 ug/min via INTRAVENOUS

## 2021-12-08 MED ORDER — MIDAZOLAM HCL 2 MG/2ML IJ SOLN
2.0000 mg | INTRAMUSCULAR | Status: DC | PRN
  Administered 2021-12-09 (×4): 2 mg via INTRAVENOUS
  Filled 2021-12-08 (×4): qty 2

## 2021-12-08 MED ORDER — SODIUM CHLORIDE 0.9% FLUSH
3.0000 mL | Freq: Two times a day (BID) | INTRAVENOUS | Status: DC
  Administered 2021-12-09 – 2021-12-13 (×10): 3 mL via INTRAVENOUS

## 2021-12-08 MED ORDER — 0.9 % SODIUM CHLORIDE (POUR BTL) OPTIME
TOPICAL | Status: DC | PRN
  Administered 2021-12-08: 5000 mL

## 2021-12-08 MED ORDER — MAGNESIUM SULFATE 4 GM/100ML IV SOLN
4.0000 g | Freq: Once | INTRAVENOUS | Status: AC
  Administered 2021-12-08: 4 g via INTRAVENOUS
  Filled 2021-12-08: qty 100

## 2021-12-08 MED ORDER — PHENYLEPHRINE 40 MCG/ML (10ML) SYRINGE FOR IV PUSH (FOR BLOOD PRESSURE SUPPORT)
PREFILLED_SYRINGE | INTRAVENOUS | Status: DC | PRN
  Administered 2021-12-08: 40 ug via INTRAVENOUS
  Administered 2021-12-08: 80 ug via INTRAVENOUS
  Administered 2021-12-08 (×2): 40 ug via INTRAVENOUS
  Administered 2021-12-08 (×2): 80 ug via INTRAVENOUS

## 2021-12-08 MED ORDER — PROTAMINE SULFATE 10 MG/ML IV SOLN
INTRAVENOUS | Status: DC | PRN
  Administered 2021-12-08: 20 mg via INTRAVENOUS
  Administered 2021-12-08: 230 mg via INTRAVENOUS
  Administered 2021-12-08: 10 mg via INTRAVENOUS
  Administered 2021-12-08: 120 mg via INTRAVENOUS
  Administered 2021-12-08: 25 mg via INTRAVENOUS

## 2021-12-08 MED ORDER — EPINEPHRINE PF 1 MG/ML IJ SOLN
INTRAMUSCULAR | Status: DC | PRN
  Administered 2021-12-08 (×2): .3 mg via INTRAVENOUS
  Administered 2021-12-08: .2 mg via INTRAVENOUS

## 2021-12-08 SURGICAL SUPPLY — 96 items
ADH SKN CLS APL DERMABOND .7 (GAUZE/BANDAGES/DRESSINGS) ×2
BAG DECANTER FOR FLEXI CONT (MISCELLANEOUS) ×5 IMPLANT
BINDER BREAST XLRG (GAUZE/BANDAGES/DRESSINGS) ×1 IMPLANT
BLADE CLIPPER SURG (BLADE) ×5 IMPLANT
BLADE STERNUM SYSTEM 6 (BLADE) ×5 IMPLANT
BNDG ELASTIC 4X5.8 VLCR STR LF (GAUZE/BANDAGES/DRESSINGS) ×5 IMPLANT
BNDG ELASTIC 6X5.8 VLCR STR LF (GAUZE/BANDAGES/DRESSINGS) ×5 IMPLANT
BNDG GAUZE ELAST 4 BULKY (GAUZE/BANDAGES/DRESSINGS) ×5 IMPLANT
CABLE SURGICAL S-101-97-12 (CABLE) ×5 IMPLANT
CANISTER SUCT 3000ML PPV (MISCELLANEOUS) ×5 IMPLANT
CANNULA MC2 2 STG 29/37 NON-V (CANNULA) ×4 IMPLANT
CANNULA MC2 TWO STAGE (CANNULA) ×5
CANNULA NON VENT 20FR 12 (CANNULA) ×4 IMPLANT
CANNULA NON VENT 22FR 12 (CANNULA) ×1 IMPLANT
CATH ROBINSON RED A/P 18FR (CATHETERS) ×10 IMPLANT
CLIP RETRACTION 3.0MM CORONARY (MISCELLANEOUS) ×4 IMPLANT
CLIP VESOCCLUDE MED 24/CT (CLIP) IMPLANT
CLIP VESOCCLUDE SM WIDE 24/CT (CLIP) IMPLANT
CONN ST 1/2X1/2  BEN (MISCELLANEOUS)
CONN ST 1/2X1/2 BEN (MISCELLANEOUS) ×4 IMPLANT
CONNECTOR BLAKE 2:1 CARIO BLK (MISCELLANEOUS) ×5 IMPLANT
CONTAINER PROTECT SURGISLUSH (MISCELLANEOUS) ×10 IMPLANT
DERMABOND ADVANCED (GAUZE/BANDAGES/DRESSINGS) ×1
DERMABOND ADVANCED .7 DNX12 (GAUZE/BANDAGES/DRESSINGS) IMPLANT
DRAIN CHANNEL 19F RND (DRAIN) ×15 IMPLANT
DRAIN CONNECTOR BLAKE 1:1 (MISCELLANEOUS) ×5 IMPLANT
DRAPE CARDIOVASCULAR INCISE (DRAPES) ×5
DRAPE INCISE IOBAN 66X45 STRL (DRAPES) IMPLANT
DRAPE SRG 135X102X78XABS (DRAPES) ×4 IMPLANT
DRAPE WARM FLUID 44X44 (DRAPES) ×5 IMPLANT
DRSG AQUACEL AG ADV 3.5X10 (GAUZE/BANDAGES/DRESSINGS) ×5 IMPLANT
DRSG COVADERM 4X14 (GAUZE/BANDAGES/DRESSINGS) ×5 IMPLANT
ELECT BLADE 4.0 EZ CLEAN MEGAD (MISCELLANEOUS) ×5
ELECT REM PT RETURN 9FT ADLT (ELECTROSURGICAL) ×10
ELECTRODE BLDE 4.0 EZ CLN MEGD (MISCELLANEOUS) ×4 IMPLANT
ELECTRODE REM PT RTRN 9FT ADLT (ELECTROSURGICAL) ×8 IMPLANT
FELT TEFLON 1X6 (MISCELLANEOUS) ×9 IMPLANT
GAUZE 4X4 16PLY ~~LOC~~+RFID DBL (SPONGE) ×5 IMPLANT
GAUZE SPONGE 4X4 12PLY STRL (GAUZE/BANDAGES/DRESSINGS) ×10 IMPLANT
GAUZE SPONGE 4X4 12PLY STRL LF (GAUZE/BANDAGES/DRESSINGS) ×2 IMPLANT
GLOVE SURG ENC MOIS LTX SZ7 (GLOVE) ×10 IMPLANT
GLOVE SURG ENC TEXT LTX SZ7.5 (GLOVE) ×10 IMPLANT
GLOVE SURG MICRO LTX SZ6.5 (GLOVE) ×1 IMPLANT
GLOVE SURG UNDER POLY LF SZ6.5 (GLOVE) ×1 IMPLANT
GOWN STRL REUS W/ TWL LRG LVL3 (GOWN DISPOSABLE) ×16 IMPLANT
GOWN STRL REUS W/ TWL XL LVL3 (GOWN DISPOSABLE) ×8 IMPLANT
GOWN STRL REUS W/TWL LRG LVL3 (GOWN DISPOSABLE) ×20
GOWN STRL REUS W/TWL XL LVL3 (GOWN DISPOSABLE) ×10
HEMOSTAT POWDER SURGIFOAM 1G (HEMOSTASIS) ×14 IMPLANT
INSERT SUTURE HOLDER (MISCELLANEOUS) ×5 IMPLANT
KIT BASIN OR (CUSTOM PROCEDURE TRAY) ×5 IMPLANT
KIT DRAINAGE VACCUM ASSIST (KITS) ×1 IMPLANT
KIT SUCTION CATH 14FR (SUCTIONS) ×5 IMPLANT
KIT TURNOVER KIT B (KITS) ×5 IMPLANT
KIT VASOVIEW HEMOPRO 2 VH 4000 (KITS) ×5 IMPLANT
LEAD PACING MYOCARDI (MISCELLANEOUS) ×6 IMPLANT
MARKER GRAFT CORONARY BYPASS (MISCELLANEOUS) ×15 IMPLANT
NS IRRIG 1000ML POUR BTL (IV SOLUTION) ×25 IMPLANT
PACK ACCESSORY CANNULA KIT (KITS) ×5 IMPLANT
PACK E OPEN HEART (SUTURE) ×5 IMPLANT
PACK OPEN HEART (CUSTOM PROCEDURE TRAY) ×5 IMPLANT
PAD ARMBOARD 7.5X6 YLW CONV (MISCELLANEOUS) ×10 IMPLANT
PAD ELECT DEFIB RADIOL ZOLL (MISCELLANEOUS) ×5 IMPLANT
PENCIL BUTTON HOLSTER BLD 10FT (ELECTRODE) ×5 IMPLANT
POSITIONER HEAD DONUT 9IN (MISCELLANEOUS) ×5 IMPLANT
PUNCH AORTIC ROTATE 4.0MM (MISCELLANEOUS) ×5 IMPLANT
SET MPS 3-ND DEL (MISCELLANEOUS) ×1 IMPLANT
SPONGE T-LAP 18X18 ~~LOC~~+RFID (SPONGE) ×22 IMPLANT
SUPPORT HEART JANKE-BARRON (MISCELLANEOUS) ×5 IMPLANT
SUT BONE WAX W31G (SUTURE) ×5 IMPLANT
SUT ETHIBOND 2 0 SH (SUTURE) ×10
SUT ETHIBOND 2 0 SH 36X2 (SUTURE) IMPLANT
SUT ETHIBOND X763 2 0 SH 1 (SUTURE) ×12 IMPLANT
SUT MNCRL AB 3-0 PS2 18 (SUTURE) ×10 IMPLANT
SUT MNCRL AB 4-0 PS2 18 (SUTURE) ×1 IMPLANT
SUT PDS AB 1 CTX 36 (SUTURE) ×10 IMPLANT
SUT PROLENE 4 0 RB 1 (SUTURE) ×5
SUT PROLENE 4 0 SH DA (SUTURE) ×6 IMPLANT
SUT PROLENE 4-0 RB1 .5 CRCL 36 (SUTURE) IMPLANT
SUT PROLENE 5 0 C 1 36 (SUTURE) ×16 IMPLANT
SUT PROLENE 6 0 C 1 30 (SUTURE) ×1 IMPLANT
SUT PROLENE 7 0 BV1 MDA (SUTURE) ×5 IMPLANT
SUT STEEL 6MS V (SUTURE) ×10 IMPLANT
SUT VIC AB 2-0 CT1 27 (SUTURE) ×5
SUT VIC AB 2-0 CT1 TAPERPNT 27 (SUTURE) IMPLANT
SUT VIC AB 2-0 CTX 36 (SUTURE) ×1 IMPLANT
SYSTEM SAHARA CHEST DRAIN ATS (WOUND CARE) ×5 IMPLANT
TAPE CLOTH SURG 4X10 WHT LF (GAUZE/BANDAGES/DRESSINGS) ×2 IMPLANT
TAPE PAPER 2X10 WHT MICROPORE (GAUZE/BANDAGES/DRESSINGS) ×1 IMPLANT
TOWEL GREEN STERILE (TOWEL DISPOSABLE) ×5 IMPLANT
TOWEL GREEN STERILE FF (TOWEL DISPOSABLE) ×5 IMPLANT
TRAY FOLEY SLVR 14FR TEMP STAT (SET/KITS/TRAYS/PACK) ×1 IMPLANT
TRAY FOLEY SLVR 16FR TEMP STAT (SET/KITS/TRAYS/PACK) ×4 IMPLANT
TUBING LAP HI FLOW INSUFFLATIO (TUBING) ×6 IMPLANT
UNDERPAD 30X36 HEAVY ABSORB (UNDERPADS AND DIAPERS) ×5 IMPLANT
WATER STERILE IRR 1000ML POUR (IV SOLUTION) ×10 IMPLANT

## 2021-12-08 NOTE — Anesthesia Preprocedure Evaluation (Signed)
Anesthesia Evaluation  Patient identified by MRN, date of birth, ID band Patient awake    Reviewed: Allergy & Precautions, NPO status , Patient's Chart, lab work & pertinent test results  Airway Mallampati: II  TM Distance: >3 FB     Dental   Pulmonary shortness of breath, former smoker,    breath sounds clear to auscultation       Cardiovascular + angina + CAD   Rhythm:Regular Rate:Normal     Neuro/Psych    GI/Hepatic negative GI ROS, Neg liver ROS,   Endo/Other  diabetes  Renal/GU negative Renal ROS     Musculoskeletal  (+) Arthritis ,   Abdominal   Peds  Hematology   Anesthesia Other Findings   Reproductive/Obstetrics                             Anesthesia Physical Anesthesia Plan  ASA: 3  Anesthesia Plan: General   Post-op Pain Management:    Induction: Intravenous  PONV Risk Score and Plan: 3 and Ondansetron, Midazolam and Treatment may vary due to age or medical condition  Airway Management Planned: Oral ETT  Additional Equipment: Arterial line, CVP, TEE and Ultrasound Guidance Line Placement  Intra-op Plan:   Post-operative Plan:   Informed Consent: I have reviewed the patients History and Physical, chart, labs and discussed the procedure including the risks, benefits and alternatives for the proposed anesthesia with the patient or authorized representative who has indicated his/her understanding and acceptance.     Dental advisory given  Plan Discussed with: CRNA, Anesthesiologist and Surgeon  Anesthesia Plan Comments:         Anesthesia Quick Evaluation

## 2021-12-08 NOTE — Progress Notes (Signed)
°   °  [address]       [place] [address]             [phone]       No events  Vitals:   12/07/21 2018 12/08/21 0618  BP: (!) 128/54 (!) 134/52  Pulse: 61 62  Resp: 16 17  Temp: 98 F (36.7 C) 98.1 F (36.7 C)  SpO2: 99% 100%   Alert NAD Sinus EWOB   80yo with CAD OR today for CABG  Ketura Sirek O Arijana Narayan

## 2021-12-08 NOTE — Care Management Important Message (Signed)
Important Message  Patient Details  Name: Angelena Formheresa Y Gotsch MRN: [id number] Date of Birth: July 14, 1941   Medicare Important Message Given:  Yes     Renie OraHawkins, Kaiesha Tonner Smith 12/08/2021, 10:18 AM

## 2021-12-08 NOTE — Transfer of Care (Signed)
Immediate Anesthesia Transfer of Care Note  Patient: [name] [name]  Procedure(s) Performed: CORONARY ARTERY BYPASS GRAFTING (CABG) X 3, ON PUMP< USING LEFT INTERNAL MAMMARY ARTERY AND RIGHT ENDOSCOPIC GREATER SAPHENOUS VEIN CONDUITS (Chest) TRANSESOPHAGEAL ECHOCARDIOGRAM (TEE) APPLICATION OF CELL SAVER ENDOVEIN HARVEST OF GREATER SAPHENOUS VEIN (Right: Leg Upper)  Patient Location: ICU  Anesthesia Type:General  Level of Consciousness: sedated  Airway & Oxygen Therapy: Patient remains intubated per anesthesia plan and Patient placed on Ventilator (see vital sign flow sheet for setting)  Post-op Assessment: Report given to RN and Post -op Vital signs reviewed and stable  Post vital signs: Reviewed and stable  Last Vitals:  Vitals Value Taken Time  BP    Temp    Pulse 108 12/08/21 2005  Resp 12 12/08/21 2006  SpO2 100 % 12/08/21 2005  Vitals shown include unvalidated device data.  Last Pain:  Vitals:   12/08/21 1208  TempSrc: Oral  PainSc:       Patients Stated Pain Goal: 0 ([id number] [id number])  Complications: No notable events documented.

## 2021-12-08 NOTE — Anesthesia Procedure Notes (Signed)
Procedure Name: Intubation Date/Time: 12/08/2021 2:22 PM Performed by: Janene Harvey, CRNA Pre-anesthesia Checklist: Patient identified, Emergency Drugs available, Suction available and Patient being monitored Patient Re-evaluated:Patient Re-evaluated prior to induction Oxygen Delivery Method: Circle system utilized Preoxygenation: Pre-oxygenation with 100% oxygen Induction Type: IV induction Ventilation: Mask ventilation without difficulty Laryngoscope Size: Mac and 4 Grade View: Grade I Tube type: Oral Tube size: 8.0 mm Number of attempts: 1 Airway Equipment and Method: Stylet and Oral airway Placement Confirmation: ETT inserted through vocal cords under direct vision, positive ETCO2 and breath sounds checked- equal and bilateral Secured at: 22 cm Tube secured with: Tape Dental Injury: Teeth and Oropharynx as per pre-operative assessment

## 2021-12-08 NOTE — Progress Notes (Signed)
°  Echocardiogram Echocardiogram Transesophageal has been performed.  [name] [name] 12/08/2021, 4:10 PM

## 2021-12-08 NOTE — Progress Notes (Addendum)
The patient has been seen in conjunction with Vin Bhagat, PA-C. All aspects of care have been considered and discussed. The patient has been personally interviewed, examined, and all clinical data has been reviewed.  No chest pain. CABG today by Dr. Kipp Brood.   Progress Note  Patient Name: [name] [name] Date of Encounter: 12/08/2021  [facility] Cardiologist: Mertie Moores, MD   Subjective   Feeling well. No chest pain, sob or palpitations.    Inpatient Medications    Scheduled Meds:  aspirin  81 mg Oral Daily   chlorhexidine  15 mL Mouth/Throat Once   Chlorhexidine Gluconate Cloth  6 each Topical Daily   Chlorhexidine Gluconate Cloth  6 each Topical Once   epinephrine  0-10 mcg/min Intravenous To OR   folic acid  1 mg Oral QPM   heparin-papaverine-plasmalyte irrigation   Irrigation To OR   insulin aspart  0-5 Units Subcutaneous QHS   insulin aspart  0-9 Units Subcutaneous TID WC   insulin   Intravenous To OR   [facility] Blood Cardioplegia vial (lidocaine/magnesium/mannitol 0.26g-4g-6.4g)   Intracoronary To OR   losartan  25 mg Oral Daily   metoprolol tartrate  12.5 mg Oral BID   metoprolol tartrate  12.5 mg Oral Once   phenylephrine  30-200 mcg/min Intravenous To OR   potassium chloride  80 mEq Other To OR   sodium chloride flush  3 mL Intravenous Q12H   tranexamic acid  15 mg/kg Intravenous To OR   tranexamic acid  2 mg/kg Intracatheter To OR   Continuous Infusions:  sodium chloride Stopped (12/04/21 1110)    ceFAZolin (ANCEF) IV      ceFAZolin (ANCEF) IV     dexmedetomidine     heparin 30,000 units/NS 1000 mL solution for CELLSAVER     heparin 850 Units/hr (12/07/21 0642)   milrinone     nitroGLYCERIN     nitroGLYCERIN     norepinephrine     tranexamic acid (CYKLOKAPRON) infusion ([facility])     vancomycin     PRN Meds: sodium chloride, acetaminophen, hydrALAZINE, nitroGLYCERIN, ondansetron (ZOFRAN) IV, sodium chloride flush, temazepam   Vital Signs     Vitals:   12/07/21 1059 12/07/21 1435 12/07/21 2018 12/08/21 0618  BP: 134/63 (!) 107/57 (!) 128/54 (!) 134/52  Pulse: 64  61 62  Resp:  16 16 17   Temp:  98.1 F (36.7 C) 98 F (36.7 C) 98.1 F (36.7 C)  TempSrc:  Oral Oral Oral  SpO2:   99% 100%  Weight:      Height:        Intake/Output Summary (Last 24 hours) at 12/08/2021 0814 Last data filed at 12/08/2021 0700 Gross per 24 hour  Intake 459.55 ml  Output 1100 ml  Net -640.45 ml   Last 3 Weights 12/07/2021 12/02/2021 11/27/2021  Weight (lbs) 148 lb 2.4 oz 147 lb 147 lb 3.2 oz  Weight (kg) 67.2 kg 66.679 kg 66.769 kg      Telemetry    Sinus bradycardia  - Personally Reviewed  ECG    N/A  Physical Exam   GEN: No acute distress.   Neck: No JVD Cardiac: RRR, no murmurs, rubs, or gallops.  Respiratory: Clear to auscultation bilaterally. GI: Soft, nontender, non-distended  MS: No edema; No deformity. Neuro:  Nonfocal  Psych: Normal affect   Labs    High Sensitivity Troponin:  No results for input(s): TROPONINIHS in the last 720 hours.   Chemistry Recent Labs  Lab 12/04/21  [id number] 12/05/21 1056 12/06/21 1048 12/07/21 0235 12/08/21 0301  NA 141   < > 136 136 138  K 3.9   < > 4.4 4.1 3.6  CL 110   < > 104 106 107  CO2 24   < > 26 23 25   GLUCOSE 122*   < > 229* 146* 153*  BUN 8   < > 8 10 14   CREATININE 0.86   < > 1.06* 0.94 0.90  CALCIUM 8.7*   < > 9.0 9.1 9.1  MG 1.8  --   --   --   --   PROT  --   --  6.7  --   --   ALBUMIN  --   --  3.0*  --   --   AST  --   --  26  --   --   ALT  --   --  25  --   --   ALKPHOS  --   --  63  --   --   BILITOT  --   --  0.4  --   --   GFRNONAA >60   < > 53* >60 >60  ANIONGAP 7   < > 6 7 6    < > = values in this interval not displayed.    Lipids No results for input(s): CHOL, TRIG, HDL, LABVLDL, LDLCALC, CHOLHDL in the last 168 hours.  Hematology Recent Labs  Lab 12/06/21 0429 12/07/21 0235 12/08/21 0301  WBC 6.6 6.6 6.8  RBC 4.38 3.98 4.21  HGB 13.3 12.1 12.5   HCT 40.5 36.8 39.0  MCV 92.5 92.5 92.6  MCH 30.4 30.4 29.7  MCHC 32.8 32.9 32.1  RDW 14.8 15.0 15.1  PLT 172 198 203     Radiology    DG Chest 2 View  Result Date: 12/06/2021 CLINICAL DATA:  Preop evaluation, coronary artery disease EXAM: CHEST - 2 VIEW COMPARISON:  07/09/2021 FINDINGS: Transverse diameter of heart is increased. There are no signs of pulmonary edema or focal pulmonary consolidation. There is no significant pleural effusion or pneumothorax. Degenerative changes are noted in the left shoulder. IMPRESSION: No active cardiopulmonary disease. Electronically Signed   By: Elmer Picker M.D.   On: 12/06/2021 16:39    Cardiac Studies   Echo 12/02/21 IMPRESSIONS   1. Left ventricular ejection fraction, by estimation, is 60 to 65%. The  left ventricle has normal function. The left ventricle has no regional  wall motion abnormalities. Left ventricular diastolic parameters were  normal.   2. Right ventricular systolic function is normal. The right ventricular  size is normal. There is normal pulmonary artery systolic pressure. The  estimated right ventricular systolic pressure is 123XX123 mmHg.   3. The mitral valve is grossly normal. Trivial mitral valve  regurgitation. No evidence of mitral stenosis.   4. The aortic valve is tricuspid. Aortic valve regurgitation is not  visualized. No aortic stenosis is present.   5. The inferior vena cava is dilated in size with >50% respiratory  variability, suggesting right atrial pressure of 8 mmHg.   LEFT HEART CATH AND CORONARY ANGIOGRAPHY 12/02/21   Conclusion  CONCLUSIONS: 50% distal left main.   95% ostial LAD with TIMI-3 flow. 80% mid diagonal #2.  95% apical LAD. 30 to 50% proximal circumflex Codominant right coronary with large bifurcating acute marginal 95% and also 90 to 95% proximal RCA. Normal LV function.  Normal LVEDP. Prolonged pain after completing left coronary angiogram requiring IV nitroglycerin  with up  titration, IV beta-blockade, and intracoronary nitroglycerin to relieve.   RECOMMENDATIONS:   Admission IV nitroglycerin and IV heparin TCTS consultation for consideration of multivessel bypass.  Patient Profile     81 y.o. female with hx of CAD s/p remote history of PTCA 1997, hyperlipidemia and carotid disease who was seen recently by him 11/27/2021 with progressive chest pain which is nitrate responsive and was referred for diagnostic coronary angiography.  Assessment & Plan    CAD - Cath showed multivessel CAD as above. For CABG later today. No chest pain. Continue ASA, BB and Losartan. Statin intolerance.  2. HLD - Hx of statin intolerance. Outpatient Lipid clinic evaluation.   3. DM - On SSI  4. HTN - BP stable on BB and ARB  For questions or updates, please contact [facility] Please consult [email or web address] for contact info under        SignedLeanor Kail, PA  12/08/2021, 8:14 AM

## 2021-12-08 NOTE — Brief Op Note (Signed)
12/02/2021 - 12/08/2021  4:59 PM  PATIENT:  [name] [name]  81 y.o. female  PRE-OPERATIVE DIAGNOSIS:  CAD  POST-OPERATIVE DIAGNOSIS:  CAD  PROCEDURE:  CORONARY ARTERY BYPASS GRAFTING  TRANSESOPHAGEAL ECHOCARDIOGRAM  APPLICATION OF CELL SAVER LIMA-LAD SVG-OM SVG-PDA  ENDOVEIN HARVEST OF GREATER SAPHENOUS VEIN (Right) Vein harvest time: 63min       Vein Prep time:25 min  SURGEON: Lajuana Matte, MD - Primary  PHYSICIAN ASSISTANT: Stanlee Roehrig  ASSISTANTS: Sammie Bench, RN, RN First Assistant   ANESTHESIA:   general  BLOOD ADMINISTERED:1 unit PRBC's  DRAINS:  mediastinal and left pleural tubes    LOCAL MEDICATIONS USED:  NONE  SPECIMEN:  No Specimen  DISPOSITION OF SPECIMEN:  N/A  COUNTS:  YES  DICTATION: .Dragon Dictation  PLAN OF CARE: Admit to inpatient   PATIENT DISPOSITION:  ICU - intubated and hemodynamically stable.   Delay start of Pharmacological VTE agent (>24hrs) due to surgical blood loss or risk of bleeding: yes

## 2021-12-08 NOTE — OR Nursing (Signed)
Forty-five minute call to 2 Heart Charge Nurse at [phone]. Spoke to [place]. Twenty minute call to 2 Heart Charge Nurse at [phone]. Spoke to [place].

## 2021-12-08 NOTE — Progress Notes (Signed)
ANTICOAGULATION CONSULT NOTE - Follow Up Consult  Pharmacy Consult for IV Heparin Indication: chest pain/ACS  Allergies  Allergen Reactions   Cholesterol     All cholesterol meds cause pain and aching in joints   Hydrocodone Nausea Only   Statins     Joint pain   Metformin And Related     Joint pain    Patient Measurements: Height: 5\' 2"  (157.5 cm) Weight: 67.2 kg (148 lb 2.4 oz) IBW/kg (Calculated) : 50.1 Heparin Dosing Weight: 63.8 kg  Vital Signs: Temp: 98.1 F (36.7 C) (01/23 0618) Temp Source: Oral (01/23 0618) BP: 134/52 (01/23 0618) Pulse Rate: 62 (01/23 0618)  Labs: Recent Labs    12/06/21 0429 12/06/21 1048 12/07/21 0235 12/08/21 0301  HGB 13.3  --  12.1 12.5  HCT 40.5  --  36.8 39.0  PLT 172  --  198 203  APTT  --  110*  --   --   LABPROT  --  14.4  --   --   INR  --  1.1  --   --   HEPARINUNFRC 0.65  --  0.50 0.57  CREATININE 0.89 1.06* 0.94 0.90     Estimated Creatinine Clearance: 44.8 mL/min (by C-G formula based on SCr of 0.9 mg/dL).   Medical History: Past Medical History:  Diagnosis Date   CAD (coronary artery disease)    Catheterization 2004, 60% distal LAD, 80% ostial circumflex( not optimal for PCI), 70% small RCA, medical therapy  /  nuclear June, 2005, EF 65%, no ischemia   Carotid artery disease ([place])    Doppler, November, 2011, no significant plaque, distal R. ICA velocities are elevated and could be source of bruit, 0-39% bilateral   Diabetes mellitus    Drug therapy    Intermittent steroid use   Dyslipidemia    Edema    Ejection fraction    EF 60%, echo, November, 2011, trivial pericardial effusion   Rheumatoid arthritis(714.0)    Hospitalization August, 2011, severe RA flare,    Statin intolerance     Medications:  Scheduled:   aspirin  81 mg Oral Daily   chlorhexidine  15 mL Mouth/Throat Once   Chlorhexidine Gluconate Cloth  6 each Topical Daily   Chlorhexidine Gluconate Cloth  6 each Topical Once   epinephrine   0-10 mcg/min Intravenous To OR   folic acid  1 mg Oral QPM   heparin-papaverine-plasmalyte irrigation   Irrigation To OR   insulin aspart  0-5 Units Subcutaneous QHS   insulin aspart  0-9 Units Subcutaneous TID WC   insulin   Intravenous To OR   [facility] Blood Cardioplegia vial (lidocaine/magnesium/mannitol 0.26g-4g-6.4g)   Intracoronary To OR   losartan  25 mg Oral Daily   metoprolol tartrate  12.5 mg Oral BID   metoprolol tartrate  12.5 mg Oral Once   phenylephrine  30-200 mcg/min Intravenous To OR   potassium chloride  80 mEq Other To OR   sodium chloride flush  3 mL Intravenous Q12H   tranexamic acid  15 mg/kg Intravenous To OR   tranexamic acid  2 mg/kg Intracatheter To OR   Infusions:   sodium chloride Stopped (12/04/21 1110)    ceFAZolin (ANCEF) IV      ceFAZolin (ANCEF) IV     dexmedetomidine     heparin 30,000 units/NS 1000 mL solution for CELLSAVER     heparin 850 Units/hr (12/07/21 0642)   milrinone     nitroGLYCERIN     nitroGLYCERIN  norepinephrine     tranexamic acid (CYKLOKAPRON) infusion ([facility])     vancomycin      Assessment: 81 year-old female status post catheterization with multivessel disease and planned CABG today (12/08/2021) with Dr. Kipp Brood.   Heparin level remains therapeutic on 850 units/hr with heparin level of 0.57. Hgb 12.5, PLTs 203 - stable. No s/sx of bleeding.  Goal of Therapy:  Heparin level 0.3-0.7 units/ml Monitor platelets by anticoagulation protocol: Yes   Plan:  Continue IV Heparin to 850 units/hr Daily Heparin level and CBC while on therapy.   Debria Garret, Student Pharmacist  12/08/2021 7:29 AM

## 2021-12-08 NOTE — Op Note (Signed)
[address]       [place] [address]             [phone]                                          12/08/2021 Patient:  [name] [name] Pre-Op Dx: Three-vessel coronary artery disease   Hypertension   Diabetes mellitus   Hyperlipidemia Post-op Dx: Same Procedure: CABG X 3.  LIMA to LAD, reverse saphenous vein graft to obtuse marginal.  Reverse saphenous vein graft to PL OM Endoscopic greater saphenous vein harvest on the right   Surgeon and Role:      * Creig Landin, Eliezer Lofts, MD - Primary    *M. Roddenberry, PA-C- assisting An experienced assistant was required given the complexity of this surgery and the standard of surgical care. The assistant was needed for exposure, dissection, suctioning, retraction of delicate tissues and sutures, instrument exchange and for overall help during this procedure.    Anesthesia  general EBL: 1000 ml Blood Administration: 2 units of packed red blood cells, 1 unit of platelets Xclamp Time: 43 min Pump Time: 109 min  Drains: 19 F blake drain:  R, L, mediastinal  Wires: Atrial and ventricular Counts: correct   Indications: 81 year old female with severe three-vessel coronary artery disease.  She has preserved biventricular function and no significant valvular disease.  She is agreeable to proceed with surgical revascularization.  Findings: Small LIMA.  Good vein conduit.  The LAD was heavily calcified.  The obtuse marginal and PL OM were both small targets.  The patient became severely hypotensive with the initial dose of protamine.  Cardiopulmonary bypass was reinitiated after heparinizing the patient.  Our first attempt to separate from cardiopulmonary bypass was unsuccessful.  Inotropic support was then initiated we were able to separate without much instability.  Operative Technique: All invasive lines were placed in pre-op holding.  After the risks, benefits and alternatives were thoroughly discussed, the  patient was brought to the operative theatre.  Anesthesia was induced, and the patient was prepped and draped in normal sterile fashion.  An appropriate surgical pause was performed, and pre-operative antibiotics were dosed accordingly.  We began with simultaneous incisions along the right leg for harvesting of the greater saphenous vein and the chest for the sternotomy.  In regards to the sternotomy, this was carried down with bovie cautery, and the sternum was divided with a reciprocating saw.  Meticulous hemostasis was obtained.  The left internal thoracic artery was exposed and harvested in in pedicled fashion.  The patient was systemically heparinized, and the artery was divided distally, and placed in a papaverine sponge.    The sternal elevator was removed, and a retractor was placed.  The pericardium was divided in the midline and fashioned into a cradle with pericardial stitches.   After we confirmed an appropriate ACT, the ascending aorta was cannulated in standard fashion.  The right atrial appendage was used for venous cannulation site.  Cardiopulmonary bypass was initiated, and the heart retractor was placed. The cross clamp was applied, and a dose of anterograde cardioplegia was given with good arrest of the heart.  We moved to the posterior wall of the heart, and found a good target on the PL OM.  An arteriotomy was made, and the vein graft was anastomosed to it in an  end to side fashion.  Next we exposed the lateral wall, and found a good target on the obtuse marginal.  An end to side anastomosis with the vein graft was then created.  Finally, we exposed a good target on the LAD, and fashioned an end to side anastomosis between it and the LITA.  We began to re-warm, and a re-animation dose of cardioplegia was given.  The heart was de-aired, and the cross clamp was removed.  Meticulous hemostasis was obtained.    A partial occludding clamp was then placed on the ascending aorta, and we created  an end to side anastomosis between it and the proximal vein grafts.  Rings were placed on the proximal anastomosis.  Hemostasis was obtained, and we separated from cardiopulmonary bypass without event.  The heparin was reversed with protamine.  The patient then became hypotensive and cardiopulmonary bypass was reinitiated.  We were able to separate from cardiopulmonary bypass on the second attempt after starting inotropic and vasopressor support.  Chest tubes and wires were placed, and the sternum was re-approximated with sternal wires.  The soft tissue and skin were re-approximated wth absorbable suture.    The patient tolerated the procedure without any immediate complications, and was transferred to the ICU in guarded condition.  [name] [name]

## 2021-12-08 NOTE — Anesthesia Procedure Notes (Signed)
Arterial Line Insertion Start/End1/23/2023 1:14 PM Performed by: Audie PintoWaggoner, Paula Zietz C, CRNA, CRNA  Patient location: Pre-op. Preanesthetic checklist: patient identified, IV checked, risks and benefits discussed, surgical consent and monitors and equipment checked Lidocaine 1% used for infiltration Left, radial was placed Catheter size: 20 G Hand hygiene performed  and maximum sterile barriers used  Allen's test indicative of satisfactory collateral circulation Attempts: 2 Procedure performed without using ultrasound guided technique. Following insertion, dressing applied and Biopatch. Post procedure assessment: unchanged  Patient tolerated the procedure well with no immediate complications.

## 2021-12-08 NOTE — Anesthesia Procedure Notes (Addendum)
Central Venous Catheter Insertion Performed by: Dorris SinghGreen, Raeann Offner, MD, anesthesiologist Start/End1/23/2023 12:50 PM, 12/08/2021 12:50 PM Patient location: Pre-op. Preanesthetic checklist: patient identified, IV checked, site marked, risks and benefits discussed, surgical consent, monitors and equipment checked, pre-op evaluation, timeout performed and anesthesia consent Lidocaine 1% used for infiltration and patient sedated Hand hygiene performed  and maximum sterile barriers used  Catheter size: 8.5 Fr Sheath introducer Procedure performed using ultrasound guided technique. Ultrasound Notes:anatomy identified, needle tip was noted to be adjacent to the nerve/plexus identified, no ultrasound evidence of intravascular and/or intraneural injection and image(s) printed for medical record Attempts: 1 Following insertion, line sutured and dressing applied. Post procedure assessment: blood return through all ports, free fluid flow and no air  Patient tolerated the procedure well with no immediate complications. Additional procedure comments: CVP Flowtrack/Slick inserted without difficulty through cordis.

## 2021-12-09 ENCOUNTER — Encounter (HOSPITAL_COMMUNITY): Payer: Self-pay | Admitting: Thoracic Surgery (Cardiothoracic Vascular Surgery)

## 2021-12-09 ENCOUNTER — Inpatient Hospital Stay (HOSPITAL_COMMUNITY): Payer: Medicare PPO

## 2021-12-09 DIAGNOSIS — Z9911 Dependence on respirator [ventilator] status: Secondary | ICD-10-CM | POA: Diagnosis not present

## 2021-12-09 DIAGNOSIS — I2510 Atherosclerotic heart disease of native coronary artery without angina pectoris: Secondary | ICD-10-CM | POA: Diagnosis not present

## 2021-12-09 DIAGNOSIS — D62 Acute posthemorrhagic anemia: Secondary | ICD-10-CM | POA: Diagnosis not present

## 2021-12-09 DIAGNOSIS — Z951 Presence of aortocoronary bypass graft: Secondary | ICD-10-CM

## 2021-12-09 DIAGNOSIS — D689 Coagulation defect, unspecified: Secondary | ICD-10-CM | POA: Diagnosis not present

## 2021-12-09 LAB — GLUCOSE, CAPILLARY
Glucose-Capillary: 101 mg/dL — ABNORMAL HIGH (ref 70–99)
Glucose-Capillary: 125 mg/dL — ABNORMAL HIGH (ref 70–99)
Glucose-Capillary: 126 mg/dL — ABNORMAL HIGH (ref 70–99)
Glucose-Capillary: 127 mg/dL — ABNORMAL HIGH (ref 70–99)
Glucose-Capillary: 129 mg/dL — ABNORMAL HIGH (ref 70–99)
Glucose-Capillary: 134 mg/dL — ABNORMAL HIGH (ref 70–99)
Glucose-Capillary: 139 mg/dL — ABNORMAL HIGH (ref 70–99)
Glucose-Capillary: 147 mg/dL — ABNORMAL HIGH (ref 70–99)
Glucose-Capillary: 148 mg/dL — ABNORMAL HIGH (ref 70–99)
Glucose-Capillary: 157 mg/dL — ABNORMAL HIGH (ref 70–99)
Glucose-Capillary: 158 mg/dL — ABNORMAL HIGH (ref 70–99)
Glucose-Capillary: 163 mg/dL — ABNORMAL HIGH (ref 70–99)
Glucose-Capillary: 166 mg/dL — ABNORMAL HIGH (ref 70–99)
Glucose-Capillary: 174 mg/dL — ABNORMAL HIGH (ref 70–99)

## 2021-12-09 LAB — POCT I-STAT 7, (LYTES, BLD GAS, ICA,H+H)
Acid-base deficit: 4 mmol/L — ABNORMAL HIGH (ref 0.0–2.0)
Acid-base deficit: 5 mmol/L — ABNORMAL HIGH (ref 0.0–2.0)
Acid-base deficit: 4 mmol/L — ABNORMAL HIGH (ref 0.0–2.0)
Bicarbonate: 21 mmol/L (ref 20.0–28.0)
Bicarbonate: 19.3 mmol/L — ABNORMAL LOW (ref 20.0–28.0)
Bicarbonate: 21.3 mmol/L (ref 20.0–28.0)
Calcium, Ion: 1.12 mmol/L — ABNORMAL LOW (ref 1.15–1.40)
Calcium, Ion: 1.02 mmol/L — ABNORMAL LOW (ref 1.15–1.40)
Calcium, Ion: 1.04 mmol/L — ABNORMAL LOW (ref 1.15–1.40)
HCT: 20 % — ABNORMAL LOW (ref 36.0–46.0)
HCT: 25 % — ABNORMAL LOW (ref 36.0–46.0)
HCT: 23 % — ABNORMAL LOW (ref 36.0–46.0)
Hemoglobin: 6.8 g/dL — CL (ref 12.0–15.0)
Hemoglobin: 8.5 g/dL — ABNORMAL LOW (ref 12.0–15.0)
Hemoglobin: 7.8 g/dL — ABNORMAL LOW (ref 12.0–15.0)
O2 Saturation: 100 %
O2 Saturation: 99 %
O2 Saturation: 100 %
Patient temperature: 36.3
Patient temperature: 36.9
Patient temperature: 36.6
Potassium: 4 mmol/L (ref 3.5–5.1)
Potassium: 3.8 mmol/L (ref 3.5–5.1)
Potassium: 3.6 mmol/L (ref 3.5–5.1)
Sodium: 139 mmol/L (ref 135–145)
Sodium: 138 mmol/L (ref 135–145)
Sodium: 140 mmol/L (ref 135–145)
TCO2: 22 mmol/L (ref 22–32)
TCO2: 20 mmol/L — ABNORMAL LOW (ref 22–32)
TCO2: 22 mmol/L (ref 22–32)
pCO2 arterial: 36.6 mmHg (ref 32.0–48.0)
pCO2 arterial: 33.1 mmHg (ref 32.0–48.0)
pCO2 arterial: 36.3 mmHg (ref 32.0–48.0)
pH, Arterial: 7.362 (ref 7.350–7.450)
pH, Arterial: 7.373 (ref 7.350–7.450)
pH, Arterial: 7.375 (ref 7.350–7.450)
pO2, Arterial: 206 mmHg — ABNORMAL HIGH (ref 83.0–108.0)
pO2, Arterial: 133 mmHg — ABNORMAL HIGH (ref 83.0–108.0)
pO2, Arterial: 182 mmHg — ABNORMAL HIGH (ref 83.0–108.0)

## 2021-12-09 LAB — BASIC METABOLIC PANEL
Anion gap: 7 (ref 5–15)
Anion gap: 5 (ref 5–15)
Anion gap: 7 (ref 5–15)
BUN: 8 mg/dL (ref 8–23)
BUN: 7 mg/dL — ABNORMAL LOW (ref 8–23)
BUN: 7 mg/dL — ABNORMAL LOW (ref 8–23)
CO2: 21 mmol/L — ABNORMAL LOW (ref 22–32)
CO2: 20 mmol/L — ABNORMAL LOW (ref 22–32)
CO2: 20 mmol/L — ABNORMAL LOW (ref 22–32)
Calcium: 7.3 mg/dL — ABNORMAL LOW (ref 8.9–10.3)
Calcium: 7 mg/dL — ABNORMAL LOW (ref 8.9–10.3)
Calcium: 7 mg/dL — ABNORMAL LOW (ref 8.9–10.3)
Chloride: 109 mmol/L (ref 98–111)
Chloride: 111 mmol/L (ref 98–111)
Chloride: 108 mmol/L (ref 98–111)
Creatinine, Ser: 0.8 mg/dL (ref 0.44–1.00)
Creatinine, Ser: 0.84 mg/dL (ref 0.44–1.00)
Creatinine, Ser: 0.75 mg/dL (ref 0.44–1.00)
GFR, Estimated: >60 mL/min (ref >60)
GFR, Estimated: >60 mL/min (ref >60)
GFR, Estimated: >60 mL/min (ref >60)
Glucose, Bld: 159 mg/dL — ABNORMAL HIGH (ref 70–99)
Glucose, Bld: 124 mg/dL — ABNORMAL HIGH (ref 70–99)
Glucose, Bld: 115 mg/dL — ABNORMAL HIGH (ref 70–99)
Potassium: 4.1 mmol/L (ref 3.5–5.1)
Potassium: 4 mmol/L (ref 3.5–5.1)
Potassium: 4 mmol/L (ref 3.5–5.1)
Sodium: 137 mmol/L (ref 135–145)
Sodium: 136 mmol/L (ref 135–145)
Sodium: 135 mmol/L (ref 135–145)

## 2021-12-09 LAB — PROTIME-INR
INR: 1.9 — ABNORMAL HIGH (ref 0.8–1.2)
INR: 1.4 — ABNORMAL HIGH (ref 0.8–1.2)
Prothrombin Time: 21.5 seconds — ABNORMAL HIGH (ref 11.4–15.2)
Prothrombin Time: 16.9 seconds — ABNORMAL HIGH (ref 11.4–15.2)

## 2021-12-09 LAB — FIBRINOGEN
Fibrinogen: 158 mg/dL — ABNORMAL LOW (ref 210–475)
Fibrinogen: 291 mg/dL (ref 210–475)

## 2021-12-09 LAB — CBC
HCT: 22.3 % — ABNORMAL LOW (ref 36.0–46.0)
HCT: 22.4 % — ABNORMAL LOW (ref 36.0–46.0)
HCT: 27.8 % — ABNORMAL LOW (ref 36.0–46.0)
Hemoglobin: 7.4 g/dL — ABNORMAL LOW (ref 12.0–15.0)
Hemoglobin: 7.4 g/dL — ABNORMAL LOW (ref 12.0–15.0)
Hemoglobin: 9.5 g/dL — ABNORMAL LOW (ref 12.0–15.0)
MCH: 29.5 pg (ref 26.0–34.0)
MCH: 29.7 pg (ref 26.0–34.0)
MCH: 30.6 pg (ref 26.0–34.0)
MCHC: 33 g/dL (ref 30.0–36.0)
MCHC: 33.2 g/dL (ref 30.0–36.0)
MCHC: 34.2 g/dL (ref 30.0–36.0)
MCV: 86.3 fL (ref 80.0–100.0)
MCV: 89.6 fL (ref 80.0–100.0)
MCV: 92.6 fL (ref 80.0–100.0)
Platelets: 110 10*3/uL — ABNORMAL LOW (ref 150–400)
Platelets: 124 10*3/uL — ABNORMAL LOW (ref 150–400)
Platelets: 125 10*3/uL — ABNORMAL LOW (ref 150–400)
RBC: 2.42 MIL/uL — ABNORMAL LOW (ref 3.87–5.11)
RBC: 2.49 MIL/uL — ABNORMAL LOW (ref 3.87–5.11)
RBC: 3.22 MIL/uL — ABNORMAL LOW (ref 3.87–5.11)
RDW: 15.2 % (ref 11.5–15.5)
RDW: 15.5 % (ref 11.5–15.5)
RDW: 15.9 % — ABNORMAL HIGH (ref 11.5–15.5)
WBC: 6.3 10*3/uL (ref 4.0–10.5)
WBC: 7.7 10*3/uL (ref 4.0–10.5)
WBC: 7.7 10*3/uL (ref 4.0–10.5)
nRBC: 0 % (ref 0.0–0.2)
nRBC: 0 % (ref 0.0–0.2)
nRBC: 0 % (ref 0.0–0.2)

## 2021-12-09 LAB — PREPARE PLATELET PHERESIS: Unit division: 0

## 2021-12-09 LAB — BPAM PLATELET PHERESIS
Blood Product Expiration Date: 202301242359
ISSUE DATE / TIME: 202301231846
Unit Type and Rh: 5100

## 2021-12-09 LAB — APTT
aPTT: 138 seconds — ABNORMAL HIGH (ref 24–36)
aPTT: 41 seconds — ABNORMAL HIGH (ref 24–36)

## 2021-12-09 LAB — PREPARE RBC (CROSSMATCH)

## 2021-12-09 LAB — MAGNESIUM
Magnesium: 2.9 mg/dL — ABNORMAL HIGH (ref 1.7–2.4)
Magnesium: 2.2 mg/dL (ref 1.7–2.4)

## 2021-12-09 MED ORDER — ORAL CARE MOUTH RINSE
15.0000 mL | OROMUCOSAL | Status: DC
  Administered 2021-12-09 (×5): 15 mL via OROMUCOSAL

## 2021-12-09 MED ORDER — CHLORHEXIDINE GLUCONATE 0.12 % MT SOLN
15.0000 mL | Freq: Two times a day (BID) | OROMUCOSAL | Status: DC
  Administered 2021-12-09 – 2021-12-15 (×13): 15 mL via OROMUCOSAL
  Filled 2021-12-09 (×10): qty 15

## 2021-12-09 MED ORDER — SODIUM BICARBONATE 8.4 % IV SOLN
50.0000 meq | Freq: Once | INTRAVENOUS | Status: AC
  Administered 2021-12-09: 18:00:00 50 meq via INTRAVENOUS

## 2021-12-09 MED ORDER — CHLORHEXIDINE GLUCONATE 0.12% ORAL RINSE (MEDLINE KIT)
15.0000 mL | Freq: Two times a day (BID) | OROMUCOSAL | Status: DC
  Administered 2021-12-09: 10:00:00 15 mL via OROMUCOSAL

## 2021-12-09 MED ORDER — INSULIN ASPART 100 UNIT/ML IJ SOLN
0.0000 [IU] | INTRAMUSCULAR | Status: DC
  Administered 2021-12-09: 20:00:00 4 [IU] via SUBCUTANEOUS
  Administered 2021-12-09: 16:00:00 2 [IU] via SUBCUTANEOUS
  Administered 2021-12-10: 05:00:00 4 [IU] via SUBCUTANEOUS
  Administered 2021-12-10: 08:00:00 2 [IU] via SUBCUTANEOUS
  Administered 2021-12-10: 01:00:00 4 [IU] via SUBCUTANEOUS
  Administered 2021-12-10 – 2021-12-12 (×8): 2 [IU] via SUBCUTANEOUS
  Administered 2021-12-12: 4 [IU] via SUBCUTANEOUS
  Administered 2021-12-12 – 2021-12-13 (×3): 2 [IU] via SUBCUTANEOUS

## 2021-12-09 MED ORDER — DOCUSATE SODIUM 50 MG/5ML PO LIQD
200.0000 mg | Freq: Every day | ORAL | Status: DC
  Administered 2021-12-09 – 2021-12-13 (×4): 200 mg
  Filled 2021-12-09 (×4): qty 20

## 2021-12-09 MED ORDER — ORAL CARE MOUTH RINSE
15.0000 mL | Freq: Two times a day (BID) | OROMUCOSAL | Status: DC
  Administered 2021-12-10 – 2021-12-15 (×11): 15 mL via OROMUCOSAL

## 2021-12-09 MED ORDER — SODIUM CHLORIDE 0.9% IV SOLUTION: Freq: Once | INTRAVENOUS | Status: DC

## 2021-12-09 NOTE — Discharge Instructions (Addendum)
Please take lasix daily x 7 days, then resume home regimen of MWF   Discharge Instructions:  1. You may shower, please wash incisions daily with soap and water and keep dry.  If you wish to cover wounds with dressing you may do so but please keep clean and change daily.  No tub baths or swimming until incisions have completely healed.  If your incisions become red or develop any drainage please call our office at [phone]  2. No Driving until cleared by Dr. Lucilla LameLightfoot's office and you are no longer using narcotic pain medications  3. Monitor your weight daily.. Please use the same scale and weigh at same time... If you gain 5-10 lbs in 48 hours with associated lower extremity swelling, please contact our office at [phone]  4. Fever of 101.5 for at least 24 hours with no source, please contact our office at [phone]  5. Activity- up as tolerated, please walk at least 3 times per day.  Avoid strenuous activity, no lifting, pushing, or pulling with your arms over 8-10 lbs for a minimum of 6 weeks  6. If any questions or concerns arise, please do not hesitate to contact our office at [phone]

## 2021-12-09 NOTE — Procedures (Signed)
Extubation Procedure Note  Patient Details:   Name: [name] [name] DOB: 06-13-1941 MRN: [id number]   Airway Documentation:    Vent end date: 12/09/21 Vent end time: 1738   Evaluation  O2 sats: stable throughout Complications: No apparent complications Patient did tolerate procedure well. Bilateral Breath Sounds: Clear, Diminished   Yes  Pt extubated to 4L [place]. Positive cuff leak noted, no stridor heard. Pt performed NIF -40 and VC 0.8L with great effort. RT will continue to monitor.   [name] [name] 12/09/2021, 5:49 PM

## 2021-12-09 NOTE — Progress Notes (Signed)
Post op day 1 s/p CABG x 3. RCA too small to graft. Post ECG is non-ischemic. Coagulopathy with low hemoglobin is issue.

## 2021-12-09 NOTE — Hospital Course (Addendum)
History of Present Illness:  [name] [name] is an 81 year female with a past medical history of diabetes mellitus, dyslipidemia (statin intolerant), remote tobacco abuse, rheumatoid arthritis who has had complaints of chest heaviness, pain between shoulders, a feeling of overall "uneasiness", some LE selling, and shortness of breath for the last several weeks. She saw Dr. Acie Fredrickson in the office on 11/27/2021. Patient last had cardiac catheterization done in February 2020 and results then showed LVEF 55-65% distal LAD with an 80% stenosis, and proximal RCA with an 85% stenosis. She was managed medically (put on Toprol XL 25). She presented today to undergo a cardiac catheterization as it was felt her symptoms were now an anginal equivalent. Cardiac catheterization done today showed 95% ostial LAD, 80% mid Diagonal, and codominant RCA with large bifurcating acute marginal 95%, and a proximal RCA 90-95%.  She had chest pain post procedure and was placed on NTG and Heparin drip.  It was felt coronary bypass grafting would be indicated and cardiac surgery consultation was requested.    Hospital Course:  The patient remained chest pain free during hospitalization.  She was evaluated by Dr. Kipp Brood who felt patient would require coronary bypass grafting.  The risks and benefits of the procedure were explained to the patient and she was agreeable to proceed.  The patient was taken to the operating room on 12/08/2021.  She underwent CABG x 3 utilizing LIMA to LAD, SVG to OM, and SVG to PL.  She also underwent endoscopic  harvest of greater saphenous vein on the right leg.  She tolerated the procedure without difficulty and was taken to the SICU in guarded condition.  She required transfusion of packed cells and platelets in the operating room.  She was coagulopathic overnight.  She became agitated when sedation was weaned. This resolved and she was extubated on 12/09/2021.  THe patient required Milrinone and Levophed.   These were weaned as hemodynamics allowed.  The patient was not started on a beta blocker due to hypotension.  She was in NSR and her pacing wires were removed without difficulty.  Her chest tube output decreased and her chest tubes were removed on 12/12/2021.  Follow up CXR showed no pneumothorax but there was basilar atelectasis.  She was started on lasix for volume overloaded state. The patient is deconditioned and PT/OT consults were obtained.  They recommended CIR placement.  The patient developed belching and regurgitation with oral intake.  SLP evaluation was obtained and showed no evidence of aspiration.  She does have history of esophageal web and has underwent endoscopy in the past.  She was initiated on a dysphagia 2 diet. She continued to work with SLP and her diet was advanced to regular diet on 1/27.  The patient was transferred to the progressive care unit on 1/28.  She remained in NSR with PVCs and his lopressor dose was titrated.  She complained of mouth discomfort, with exam revealed white plaques.  She was started on Nystatin swish and swallow for thrush.  The patient was not willing to go to CIR at discharge.  She states her brother would be available to provider her assistance.  Home health has been arranged.  The patient has developed mild hypertension and she was restarted on her home regimen of Losartan.  The patient's surgical incisions are healing without evidence of infection.  She is ambulating with assistance of a rolling walker.  She is medically stable for discharge home today.

## 2021-12-09 NOTE — Consult Note (Signed)
NAME:  [name] [name], MRN:  [id number], DOB:  1941-07-09, LOS: 7 ADMISSION DATE:  12/02/2021, CONSULTATION DATE:  12/09/21 REFERRING MD:  Kipp Brood, CHIEF COMPLAINT:  post-CABG   History of Present Illness:  Ms. [name] [name] is an 81 y/o woman with a history of CAD who presented with anginal symptoms of chest heaviness, pain between her shoulders, overall uneasiness, edema of lower extremities, shortness of breath who presented for elective heart catheterization.  She was found to have three-vessel coronary artery disease.  LVEF 55 to 60%.  She underwent elective CABG on 1/23.  Pertinent  Medical History  RA DM CAD HLD with statin intolerance, remote history of tobacco abuse  Significant Hospital Events: Including procedures, antibiotic start and stop dates in addition to other pertinent events   1/23 CABG, did not tolerate protamine reversal 1/24 blood output from chest tubes, getting several transfusions  Interim History / Subjective:    Objective   Blood pressure (!) 123/57, pulse 78, temperature 97.7 F (36.5 C), temperature source Bladder, resp. rate (!) 0, height 5\' 2"  (1.575 m), weight 67.2 kg, SpO2 100 %. CVP:  [2 mmHg-19 mmHg] 19 mmHg CO:  [3.6 L/min-4.5 L/min] 4.2 L/min CI:  [2.2 L/min/m2-2.7 L/min/m2] 2.5 L/min/m2  Vent Mode: SIMV;PSV;PRVC FiO2 (%):  [40 %-50 %] 50 % Set Rate:  [12 bmp] 12 bmp Vt Set:  [450 mL] 450 mL PEEP:  [5 cmH20] 5 cmH20 Plateau Pressure:  [6 cmH20-16 cmH20] 14 cmH20   Intake/Output Summary (Last 24 hours) at 12/09/2021 0738 Last data filed at 12/09/2021 0600 Gross per 24 hour  Intake 6910.77 ml  Output 5289 ml  Net 1621.77 ml   Filed Weights   12/02/21 1039 12/07/21 0359  Weight: 66.7 kg 67.2 kg    Examination: General: critically ill appearing woman, intubated and sedated HENT: [place]/AT, eyes anicteric Lungs: Breathing comfortably on MV, synchronous. CTAB. Cardiovascular: S1S2, RRR Abdomen: soft, NT.  Extremities: no peripheral edema, no  cyanosis Neuro: RASS -5, synchronous with VM, PERRL GU: foley draining yellow urine  Bicarb 20 BUN 7 Cr 0.75 WBC 7.7 H/H 9.5/27.8 Platelets 124 INR 1.4  CXR personally reviewed> ETT, RIJ CVC in appropriate position, mediastinal and chest tubes in place.  Resolved Hospital Problem list     Assessment & Plan:   CAD, 3 vessel disease, s/p CABG Complicated by post-op coagulopathy and bleeding -agree with FFP, cryo, RBC resuscitation -post-op antibiotics -post-op chest tube management per primary -pain control per protocol- morphine, oxy, tramadol  Acute blood loss anemia -resuscitation to hemodynamic stability -con't to monitor- repeat CBC, INR, fibrinogen this afternoon -monitor chest tube output  Post-op vent management -monitor chest tube output, then rapid wean -sedation per TCTS protocol  Hyperglycemia -insulin gtt per protocol  Anemia, coagulopathy, thrombocytopenia due to intra-op anticoagulation, expected operative blood loss -resuscitation with blood products -monitor chest tube output -monitor CBC, coags this afternoon and daily  Remote history of tobacco abuse, no evidence of COPD on exam.  -outside the recommended age for lung cancer screening; no referral needed   Best Practice (right click and "Reselect all SmartList Selections" daily)   Diet/type: NPO DVT prophylaxis: SCD GI prophylaxis: PPI Lines: Central line and Arterial Line Foley:  Yes, and it is still needed Code Status:  full code Last date of multidisciplinary goals of care discussion [ ]   Labs   CBC: Recent Labs  Lab 12/07/21 0235 12/08/21 0301 12/08/21 1426 12/08/21 1645 12/08/21 1707 12/08/21 2002 12/08/21 2004 12/09/21 0033 12/09/21 0512 12/09/21  0607  WBC 6.6 6.8  --   --   --  7.1  --  7.7 6.3  --   HGB 12.1 12.5   < > 6.2*   < > 8.5* 8.5* 7.4* 7.4* 6.8*  HCT 36.8 39.0   < > 18.1*   < > 24.8* 25.0* 22.4* 22.3* 20.0*  MCV 92.5 92.6  --   --   --  92.9  --  92.6 89.6  --    PLT 198 203  --  105*  --  107*  --  125* 110*  --    < > = values in this interval not displayed.    Basic Metabolic Panel: Recent Labs  Lab 12/04/21 0555 12/05/21 1056 12/06/21 1048 12/07/21 0235 12/08/21 0301 12/08/21 1426 12/08/21 1638 12/08/21 1641 12/08/21 1805 12/08/21 1808 12/08/21 1843 12/08/21 1848 12/08/21 2004 12/09/21 0033 12/09/21 0512 12/09/21 0607  NA 141   < > 136 136 138   < > 137   < > 139   < > 139 139 141 137 136 139  K 3.9   < > 4.4 4.1 3.6   < > 4.4   < > 3.9   < > 4.1 4.1 3.9 4.1 4.0 4.0  CL 110   < > 104 106 107   < > 101  --  103  --  105  --   --  109 111  --   CO2 24   < > 26 23 25   --   --   --   --   --   --   --   --  21* 20*  --   GLUCOSE 122*   < > 229* 146* 153*   < > 93  --  143*  --  137*  --   --  159* 124*  --   BUN 8   < > 8 10 14    < > 7*  --  6*  --  6*  --   --  8 7*  --   CREATININE 0.86   < > 1.06* 0.94 0.90   < > 0.40*  --  0.50  --  0.40*  --   --  0.80 0.84  --   CALCIUM 8.7*   < > 9.0 9.1 9.1  --   --   --   --   --   --   --   --  7.3* 7.0*  --   MG 1.8  --   --   --   --   --   --   --   --   --   --   --   --  2.9*  --   --    < > = values in this interval not displayed.   GFR: Estimated Creatinine Clearance: 48 mL/min (by C-G formula based on SCr of 0.84 mg/dL). Recent Labs  Lab 12/08/21 0301 12/08/21 2002 12/09/21 0033 12/09/21 0512  WBC 6.8 7.1 7.7 6.3    Liver Function Tests: Recent Labs  Lab 12/06/21 1048  AST 26  ALT 25  ALKPHOS 63  BILITOT 0.4  PROT 6.7  ALBUMIN 3.0*   No results for input(s): LIPASE, AMYLASE in the last 168 hours. No results for input(s): AMMONIA in the last 168 hours.  ABG    Component Value Date/Time   PHART 7.362 12/09/2021 0607   PCO2ART 36.6 12/09/2021 0607   PO2ART 206 (H) 12/09/2021  0607   HCO3 21.0 12/09/2021 0607   TCO2 22 12/09/2021 0607   ACIDBASEDEF 4.0 (H) 12/09/2021 0607   O2SAT 100.0 12/09/2021 0607     Coagulation Profile: Recent Labs  Lab  12/06/21 1048 12/08/21 1955 12/09/21 0557  INR 1.1 2.1* 1.9*    Cardiac Enzymes: No results for input(s): CKTOTAL, CKMB, CKMBINDEX, TROPONINI in the last 168 hours.  HbA1C: Hgb A1c MFr Bld  Date/Time Value Ref Range Status  12/03/2021 06:11 PM 7.3 (H) 4.8 - 5.6 % Final    Comment:    (NOTE) Pre diabetes:          5.7%-6.4%  Diabetes:              >6.4%  Glycemic control for   <7.0% adults with diabetes   07/16/2010 04:17 PM (H) <5.7 % Final   8.3 (NOTE)                                                                       According to the ADA Clinical Practice Recommendations for 2011, when HbA1c is used as a screening test:   >=6.5%   Diagnostic of Diabetes Mellitus           (if abnormal result  is confirmed)  5.7-6.4%   Increased risk of developing Diabetes Mellitus  References:Diagnosis and Classification of Diabetes Mellitus,Diabetes [id number] 1):S62-S69 and Standards of Medical Care in         Diabetes - 2011,Diabetes Care,2011,34  (Suppl 1):S11-S61.    CBG: Recent Labs  Lab 12/09/21 0342 12/09/21 0434 12/09/21 0521 12/09/21 0605 12/09/21 0718  GLUCAP 157* 139* 134* 126* 148*    Review of Systems:   Unable to be obtained- intubated & sedated  Past Medical History:  She,  has a past medical history of CAD (coronary artery disease), Carotid artery disease ([place]), Diabetes mellitus, Drug therapy, Dyslipidemia, Edema, Ejection fraction, Rheumatoid arthritis(714.0), and Statin intolerance.   Surgical History:   Past Surgical History:  Procedure Laterality Date   ABDOMINAL HYSTERECTOMY     CHOLECYSTECTOMY     LEFT HEART CATH AND CORONARY ANGIOGRAPHY N/A 01/10/2019   Procedure: LEFT HEART CATH AND CORONARY ANGIOGRAPHY;  Surgeon: Martinique, Peter M, MD;  Location: [facility];  Service: Cardiovascular;  Laterality: N/A;   LEFT HEART CATH AND CORONARY ANGIOGRAPHY N/A 12/02/2021   Procedure: LEFT HEART CATH AND CORONARY ANGIOGRAPHY;  Surgeon: Belva Crome, MD;  Location: [facility];  Service: Cardiovascular;  Laterality: N/A;     Social History:   reports that she quit smoking about 27 years ago. Her smoking use included cigarettes. She has never used smokeless tobacco. She reports that she does not drink alcohol and does not use drugs.   Family History:  Her family history includes Diabetes in her mother; Hypertension in her mother; Other in her father.   Allergies Allergies  Allergen Reactions   Cholesterol     All cholesterol meds cause pain and aching in joints   Hydrocodone Nausea Only   Statins     Joint pain   Metformin And Related     Joint pain     Home Medications  Prior to Admission medications   Medication Sig Start Date  End Date Taking? Authorizing Provider  Abatacept 125 MG/ML SOSY Inject 1 Dose into the skin every 30 (thirty) days.   Yes [provider]  aspirin 81 MG tablet Take 1 tablet (81 mg total) by mouth daily. 07/26/13  Yes Carlena Bjornstad, MD  Cholecalciferol (VITAMIN D-3) 1000 units CAPS Take 1,000 Units by mouth daily.    Yes [provider]  folic acid (FOLVITE) 1 MG tablet Take 1 mg by mouth every evening.    Yes [provider]  furosemide (LASIX) 40 MG tablet Take 1 tablet (40 mg total) by mouth every Monday, Wednesday, and Friday. 11/28/21  Yes Nahser, Wonda Cheng, MD  Insulin Lispro Prot & Lispro (HUMALOG 75/25 MIX) (75-25) 100 UNIT/ML Kwikpen Inject 3-5 Units into the skin See admin instructions. Inject 5 units in the morning and 3 units in the evening 06/28/21  Yes [provider]  isosorbide mononitrate (IMDUR) 30 MG 24 hr tablet Take 2 tablets (60 mg total) by mouth daily. 11/27/21  Yes Nahser, Wonda Cheng, MD  losartan (COZAAR) 25 MG tablet Take 1 tablet (25 mg total) by mouth daily. 11/27/21  Yes Nahser, Wonda Cheng, MD  methotrexate 250 MG/10ML injection Inject 10 mg into the muscle every Sunday. 06/29/19  Yes [provider]  Multiple Vitamin (MULTIVITAMIN  WITH MINERALS) TABS tablet Take 1 tablet by mouth daily. Centrum silver   Yes [provider]  nitroGLYCERIN (NITROSTAT) 0.4 MG SL tablet PLACE 1 TABLET UNDER THE TONGUE EVERY 5 MINUTES AS NEEDED FOR CHEST PAIN 10/20/21  Yes Nahser, Wonda Cheng, MD  pantoprazole (PROTONIX) 40 MG tablet Take 40 mg by mouth daily.   Yes [provider]  potassium chloride (KLOR-CON) 10 MEQ tablet Take 1 tablet (10 mEq total) by mouth every Monday, Wednesday, and Friday. 11/28/21  Yes Nahser, Wonda Cheng, MD  ALPRAZolam Duanne Moron) 0.25 MG tablet Take 0.25 mg by mouth daily as needed. 07/02/21   [provider]  B-D UF III MINI PEN NEEDLES 31G X 5 MM MISC USE WITH INSULIN TO INJECT BID UTD 02/14/19   [provider]  Glory Rosebush VERIO test strip USE 1 STRIP TID UTD 01/07/19   [provider]     Critical care time: 40 min    Julian Hy, DO 12/09/21 5:18 PM [facility]

## 2021-12-09 NOTE — Progress Notes (Signed)
[address]       [place] [address]             [phone]                 1 Day Post-Op Procedure(s) (LRB): CORONARY ARTERY BYPASS GRAFTING (CABG) X 3, ON PUMP< USING LEFT INTERNAL MAMMARY ARTERY AND RIGHT ENDOSCOPIC GREATER SAPHENOUS VEIN CONDUITS (N/A) TRANSESOPHAGEAL ECHOCARDIOGRAM (TEE) (N/A) APPLICATION OF CELL SAVER ENDOVEIN HARVEST OF GREATER SAPHENOUS VEIN (Right)   Events: Coagulopathic overnight Agitated off sedation _______________________________________________________________ Vitals: BP (!) 123/57    Pulse 79    Temp (!) 97.3 F (36.3 C) (Core)    Resp 16    Ht 5\' 2"  (1.575 m)    Wt 67.2 kg    SpO2 100%    BMI 27.10 kg/m  Filed Weights   12/02/21 1039 12/07/21 0359  Weight: 66.7 kg 67.2 kg     - Neuro: sedated  - Cardiovascular: sinus  Drips: milr 0.25, levo 10 CVP:  [2 mmHg-9 mmHg] 7 mmHg CO:  [3.6 L/min-4.5 L/min] 4.2 L/min CI:  [2.2 L/min/m2-2.7 L/min/m2] 2.5 L/min/m2  - Pulm:  Vent Mode: SIMV;PSV;PRVC FiO2 (%):  [40 %-50 %] 50 % Set Rate:  [12 bmp] 12 bmp Vt Set:  [450 mL] 450 mL PEEP:  [5 cmH20] 5 cmH20 Plateau Pressure:  [6 cmH20-16 cmH20] 14 cmH20  ABG    Component Value Date/Time   PHART 7.362 12/09/2021 0607   PCO2ART 36.6 12/09/2021 0607   PO2ART 206 (H) 12/09/2021 0607   HCO3 21.0 12/09/2021 0607   TCO2 22 12/09/2021 0607   ACIDBASEDEF 4.0 (H) 12/09/2021 0607   O2SAT 100.0 12/09/2021 0607    - Abd: ND - Extremity: warm  .Intake/Output      01/23 0701 01/24 0700   I.V. (mL/kg) 4520.6 (67.3)   Blood 1240   IV Piggyback 1150.2   Total Intake(mL/kg) 6910.8 (102.8)   Urine (mL/kg/hr) 3105 (1.9)   Emesis/NG output 300   Blood 814   Chest Tube 1070   Total Output 5289   Net +1621.8          _______________________________________________________________ Labs: CBC Latest Ref Rng & Units 12/09/2021 12/09/2021 12/09/2021  WBC 4.0 - 10.5 K/uL - 6.3 7.7  Hemoglobin 12.0 - 15.0 g/dL 6.8(LL) 7.4(L)  7.4(L)  Hematocrit 36.0 - 46.0 % 20.0(L) 22.3(L) 22.4(L)  Platelets 150 - 400 K/uL - 110(L) 125(L)   CMP Latest Ref Rng & Units 12/09/2021 12/09/2021 12/09/2021  Glucose 70 - 99 mg/dL - 124(H) 159(H)  BUN 8 - 23 mg/dL - 7(L) 8  Creatinine 0.44 - 1.00 mg/dL - 0.84 0.80  Sodium 135 - 145 mmol/L 139 136 137  Potassium 3.5 - 5.1 mmol/L 4.0 4.0 4.1  Chloride 98 - 111 mmol/L - 111 109  CO2 22 - 32 mmol/L - 20(L) 21(L)  Calcium 8.9 - 10.3 mg/dL - 7.0(L) 7.3(L)  Total Protein 6.5 - 8.1 g/dL - - -  Total Bilirubin 0.3 - 1.2 mg/dL - - -  Alkaline Phos 38 - 126 U/L - - -  AST 15 - 41 U/L - - -  ALT 0 - 44 U/L - - -    CXR: clear  _______________________________________________________________  Assessment and Plan: POD 1 s/p CABG  Neuro: will keep sedated for now CV: on milr, and levo.  Continue volume rescuscitation.  On A.  Allergic to statin Pulm: will keep intubated for now Renal: creat stable GI:  NPO Heme: transfusing FFP, cryo, plts, and 2 units for coagulopathy ID: afebrile Endo: SSI Dispo: continue ICU care   Mister Krahenbuhl O Mattox Schorr 12/09/2021 7:00 AM

## 2021-12-10 ENCOUNTER — Inpatient Hospital Stay (HOSPITAL_COMMUNITY): Payer: Medicare PPO

## 2021-12-10 DIAGNOSIS — I2510 Atherosclerotic heart disease of native coronary artery without angina pectoris: Secondary | ICD-10-CM | POA: Diagnosis not present

## 2021-12-10 DIAGNOSIS — Z794 Long term (current) use of insulin: Secondary | ICD-10-CM

## 2021-12-10 DIAGNOSIS — E119 Type 2 diabetes mellitus without complications: Secondary | ICD-10-CM | POA: Diagnosis not present

## 2021-12-10 DIAGNOSIS — R579 Shock, unspecified: Secondary | ICD-10-CM

## 2021-12-10 DIAGNOSIS — Z951 Presence of aortocoronary bypass graft: Secondary | ICD-10-CM | POA: Diagnosis not present

## 2021-12-10 LAB — BPAM RBC
Blood Product Expiration Date: 202302082359
Blood Product Expiration Date: 202302182359
Blood Product Expiration Date: 202302182359
Blood Product Expiration Date: 202302212359
Blood Product Expiration Date: 202302212359
ISSUE DATE / TIME: 202301231702
ISSUE DATE / TIME: 202301231702
ISSUE DATE / TIME: 202301240126
ISSUE DATE / TIME: 202301240643
ISSUE DATE / TIME: 202301241137
Unit Type and Rh: 6200
Unit Type and Rh: 6200
Unit Type and Rh: 6200
Unit Type and Rh: 6200
Unit Type and Rh: 6200

## 2021-12-10 LAB — CBC
HCT: 26.6 % — ABNORMAL LOW (ref 36.0–46.0)
Hemoglobin: 9.3 g/dL — ABNORMAL LOW (ref 12.0–15.0)
MCH: 30.3 pg (ref 26.0–34.0)
MCHC: 35 g/dL (ref 30.0–36.0)
MCV: 86.6 fL (ref 80.0–100.0)
Platelets: 113 10*3/uL — ABNORMAL LOW (ref 150–400)
RBC: 3.07 MIL/uL — ABNORMAL LOW (ref 3.87–5.11)
RDW: 15.8 % — ABNORMAL HIGH (ref 11.5–15.5)
WBC: 4.5 10*3/uL (ref 4.0–10.5)
nRBC: 0 % (ref 0.0–0.2)

## 2021-12-10 LAB — BPAM CRYOPRECIPITATE
Blood Product Expiration Date: 202301241322
ISSUE DATE / TIME: 202301240817
Unit Type and Rh: 6200

## 2021-12-10 LAB — COOXEMETRY PANEL
Carboxyhemoglobin: 0.9 % (ref 0.5–1.5)
Methemoglobin: 1.1 % (ref 0.0–1.5)
O2 Saturation: 85.9 %
Total hemoglobin: 9.1 g/dL — ABNORMAL LOW (ref 12.0–16.0)

## 2021-12-10 LAB — BASIC METABOLIC PANEL
Anion gap: 6 (ref 5–15)
BUN: 6 mg/dL — ABNORMAL LOW (ref 8–23)
CO2: 20 mmol/L — ABNORMAL LOW (ref 22–32)
Calcium: 7.2 mg/dL — ABNORMAL LOW (ref 8.9–10.3)
Chloride: 105 mmol/L (ref 98–111)
Creatinine, Ser: 0.84 mg/dL (ref 0.44–1.00)
GFR, Estimated: >60 mL/min (ref >60)
Glucose, Bld: 190 mg/dL — ABNORMAL HIGH (ref 70–99)
Potassium: 3.8 mmol/L (ref 3.5–5.1)
Sodium: 131 mmol/L — ABNORMAL LOW (ref 135–145)

## 2021-12-10 LAB — TYPE AND SCREEN
ABO/RH(D): A POS
ABO/RH(D): A POS
Antibody Screen: NEGATIVE
Antibody Screen: NEGATIVE
Unit division: 0
Unit division: 0
Unit division: 0
Unit division: 0
Unit division: 0

## 2021-12-10 LAB — PREPARE CRYOPRECIPITATE: Unit division: 0

## 2021-12-10 LAB — PREPARE PLATELET PHERESIS: Unit division: 0

## 2021-12-10 LAB — BPAM PLATELET PHERESIS
Blood Product Expiration Date: 202301252359
ISSUE DATE / TIME: 202301240817
Unit Type and Rh: 6200

## 2021-12-10 LAB — BPAM FFP
Blood Product Expiration Date: 202301282359
ISSUE DATE / TIME: 202301240817
Unit Type and Rh: 6200

## 2021-12-10 LAB — PREPARE FRESH FROZEN PLASMA

## 2021-12-10 LAB — GLUCOSE, CAPILLARY
Glucose-Capillary: 149 mg/dL — ABNORMAL HIGH (ref 70–99)
Glucose-Capillary: 155 mg/dL — ABNORMAL HIGH (ref 70–99)
Glucose-Capillary: 157 mg/dL — ABNORMAL HIGH (ref 70–99)
Glucose-Capillary: 168 mg/dL — ABNORMAL HIGH (ref 70–99)
Glucose-Capillary: 198 mg/dL — ABNORMAL HIGH (ref 70–99)
Glucose-Capillary: 87 mg/dL (ref 70–99)

## 2021-12-10 LAB — MAGNESIUM: Magnesium: 2.1 mg/dL (ref 1.7–2.4)

## 2021-12-10 MED ORDER — LIDOCAINE 5 % EX PTCH
2.0000 | MEDICATED_PATCH | CUTANEOUS | Status: DC
  Administered 2021-12-10 – 2021-12-16 (×7): 2 via TRANSDERMAL
  Filled 2021-12-10 (×7): qty 2

## 2021-12-10 MED ORDER — METHOCARBAMOL 1000 MG/10ML IJ SOLN
500.0000 mg | Freq: Three times a day (TID) | INTRAVENOUS | Status: AC
  Administered 2021-12-10 – 2021-12-11 (×3): 500 mg via INTRAVENOUS
  Filled 2021-12-10: qty 500
  Filled 2021-12-10 (×2): qty 5

## 2021-12-10 MED ORDER — INSULIN ASPART 100 UNIT/ML IJ SOLN: 0.0000 [IU] | INTRAMUSCULAR | Status: DC

## 2021-12-10 MED ORDER — PANTOPRAZOLE SODIUM 40 MG IV SOLR
40.0000 mg | Freq: Two times a day (BID) | INTRAVENOUS | Status: DC
  Administered 2021-12-10 – 2021-12-11 (×2): 40 mg via INTRAVENOUS
  Filled 2021-12-10 (×2): qty 40

## 2021-12-10 MED ORDER — MILRINONE LACTATE IN DEXTROSE 20-5 MG/100ML-% IV SOLN
0.1250 ug/kg/min | INTRAVENOUS | Status: DC
  Administered 2021-12-10: 09:00:00 0.125 ug/kg/min via INTRAVENOUS

## 2021-12-10 MED ORDER — CALCIUM CARBONATE ANTACID 500 MG PO CHEW
1.0000 | CHEWABLE_TABLET | Freq: Three times a day (TID) | ORAL | Status: DC | PRN
  Administered 2021-12-14 (×2): 200 mg via ORAL
  Filled 2021-12-10 (×2): qty 1

## 2021-12-10 MED ORDER — POLYETHYLENE GLYCOL 3350 17 G PO PACK: 17.0000 g | PACK | Freq: Every day | ORAL | Status: DC | PRN

## 2021-12-10 MED FILL — Heparin Sodium (Porcine) Inj 1000 Unit/ML: Qty: 1000 | Status: AC

## 2021-12-10 MED FILL — Heparin Sodium (Porcine) Inj 1000 Unit/ML: INTRAMUSCULAR | Qty: 30 | Status: AC

## 2021-12-10 MED FILL — Potassium Chloride Inj 2 mEq/ML: INTRAVENOUS | Qty: 40 | Status: AC

## 2021-12-10 MED FILL — Albumin, Human Inj 5%: INTRAVENOUS | Qty: 250 | Status: AC

## 2021-12-10 MED FILL — Sodium Bicarbonate IV Soln 8.4%: INTRAVENOUS | Qty: 50 | Status: AC

## 2021-12-10 MED FILL — Lidocaine HCl Local Preservative Free (PF) Inj 2%: INTRAMUSCULAR | Qty: 15 | Status: AC

## 2021-12-10 MED FILL — Calcium Chloride Inj 10%: INTRAVENOUS | Qty: 10 | Status: AC

## 2021-12-10 MED FILL — Mannitol IV Soln 20%: INTRAVENOUS | Qty: 500 | Status: AC

## 2021-12-10 MED FILL — Sodium Chloride IV Soln 0.9%: INTRAVENOUS | Qty: 2000 | Status: AC

## 2021-12-10 MED FILL — Electrolyte-R (PH 7.4) Solution: INTRAVENOUS | Qty: 5000 | Status: AC
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
## 2021-12-10 NOTE — Progress Notes (Signed)
NAME:  [name] [name], MRN:  [id number], DOB:  10-02-1941, LOS: 8 ADMISSION DATE:  12/02/2021, CONSULTATION DATE:  12/09/21 REFERRING MD:  Kipp Brood, CHIEF COMPLAINT:  post-CABG   History of Present Illness:  [name] [name] is an 81 y/o woman with a history of CAD who presented with anginal symptoms of chest heaviness, pain between her shoulders, overall uneasiness, edema of lower extremities, shortness of breath who presented for elective heart catheterization.  She was found to have three-vessel coronary artery disease.  LVEF 55 to 60%.  She underwent elective CABG on 1/23.  Pertinent  Medical History  RA DM CAD HLD with statin intolerance, remote history of tobacco abuse  Significant Hospital Events: Including procedures, antibiotic start and stop dates in addition to other pertinent events   1/23 CABG, did not tolerate protamine reversal 1/24 blood output from chest tubes, getting several transfusions  Interim History / Subjective:  Extubated 1/24 Weaning milrinone and NE, currently at 24mcg/min Afebrile Patient c/o of having uncontrolled pain despite ongoing pain management CVP 11 Reports patient having swallowing issues   Objective   Blood pressure 139/60, pulse (!) 118, temperature 99.7 F (37.6 C), resp. rate (!) 8, height 5\' 2"  (1.575 m), weight 77.4 kg, SpO2 100 %. CVP:  [0 mmHg-12 mmHg] 11 mmHg CO:  [4.3 L/min] 4.3 L/min CI:  [2.3 L/min/m2] 2.3 L/min/m2    Intake/Output Summary (Last 24 hours) at 12/10/2021 1238 Last data filed at 12/10/2021 0800 Gross per 24 hour  Intake 1716.67 ml  Output 2195 ml  Net -478.33 ml   Filed Weights   12/02/21 1039 12/07/21 0359 12/10/21 0500  Weight: 66.7 kg 67.2 kg 77.4 kg    Examination: General:  Older female sitting upright in bed, appears uncomfortable HEENT: MM pink/moist Neuro:  Alert, oriented, MAE CV: ST, no murmur, midline sternal dressing clean, Ctx2 (mediastinal and pleural) in place ( 191ml/ 12hrs)  PULM:  clear  anteriorly, shallow GI: soft, bs+, NT, foley  Extremities: warm/dry, trace LE edema, RLE dressing cdi  Coox 85, Na 131, bicarb 20, sCr 0.84, H/H 9.3/ 26.6, plts 124> 113  UOP 1.5L/ 24hrs Net +439 ml   Resolved Hospital Problem list     Assessment & Plan:   CAD, 3 vessel disease, s/p CABG Complicated by post-op coagulopathy and bleeding - continue to wean milrinone and NE  - complete post-op antibiotics - post-op chest tube management per primary - pain control per protocol- morphine, oxy, tramadol, lido patches, adding robaxin with bowel regimen - plans to start diuresis after off NE  Acute blood loss anemia - H/H stable  -monitor chest tube output, 142ml/ 12hrs  Post-op vent management - extubated 1/24 - due to pain, has limited to movement.  Hopefully if we get her pain under control we can improve pulmonary hygiene   Hyperglycemia - SSI   Anemia, coagulopathy, thrombocytopenia due to intra-op anticoagulation, expected operative blood loss -trend coags and CBC, stable  -monitor chest tube output  Remote history of tobacco abuse, no evidence of COPD on exam.  -outside the recommended age for lung cancer screening; no referral needed   Best Practice (right click and "Reselect all SmartList Selections" daily)   Diet/type: NPO-> clears only  DVT prophylaxis: SCD GI prophylaxis: PPI Lines: Central line and Arterial Line Foley:  Yes, and it is still needed Code Status:  full code Last date of multidisciplinary goals of care discussion: per primary   Labs   CBC: Recent Labs  Lab 12/08/21 2002  12/08/21 2004 12/09/21 0033 12/09/21 0512 12/09/21 0607 12/09/21 1414 12/09/21 1727 12/09/21 1906 12/10/21 0449  WBC 7.1  --  7.7 6.3  --  7.7  --   --  4.5  HGB 8.5*   < > 7.4* 7.4* 6.8* 9.5* 8.5* 7.8* 9.3*  HCT 24.8*   < > 22.4* 22.3* 20.0* 27.8* 25.0* 23.0* 26.6*  MCV 92.9  --  92.6 89.6  --  86.3  --   --  86.6  PLT 107*  --  125* 110*  --  124*  --   --  113*    < > = values in this interval not displayed.    Basic Metabolic Panel: Recent Labs  Lab 12/04/21 0555 12/05/21 1056 12/08/21 0301 12/08/21 1426 12/08/21 1843 12/08/21 1848 12/09/21 0033 12/09/21 0512 12/09/21 0607 12/09/21 1414 12/09/21 1727 12/09/21 1906 12/10/21 0449  NA 141   < > 138   < > 139   < > 137 136 139 135 138 140 131*  K 3.9   < > 3.6   < > 4.1   < > 4.1 4.0 4.0 4.0 3.8 3.6 3.8  CL 110   < > 107   < > 105  --  109 111  --  108  --   --  105  CO2 24   < > 25  --   --   --  21* 20*  --  20*  --   --  20*  GLUCOSE 122*   < > 153*   < > 137*  --  159* 124*  --  115*  --   --  190*  BUN 8   < > 14   < > 6*  --  8 7*  --  7*  --   --  6*  CREATININE 0.86   < > 0.90   < > 0.40*  --  0.80 0.84  --  0.75  --   --  0.84  CALCIUM 8.7*   < > 9.1  --   --   --  7.3* 7.0*  --  7.0*  --   --  7.2*  MG 1.8  --   --   --   --   --  2.9*  --   --  2.2  --   --  2.1   < > = values in this interval not displayed.   GFR: Estimated Creatinine Clearance: 51.4 mL/min (by C-G formula based on SCr of 0.84 mg/dL). Recent Labs  Lab 12/09/21 0033 12/09/21 0512 12/09/21 1414 12/10/21 0449  WBC 7.7 6.3 7.7 4.5    Liver Function Tests: Recent Labs  Lab 12/06/21 1048  AST 26  ALT 25  ALKPHOS 63  BILITOT 0.4  PROT 6.7  ALBUMIN 3.0*   No results for input(s): LIPASE, AMYLASE in the last 168 hours. No results for input(s): AMMONIA in the last 168 hours.  ABG    Component Value Date/Time   PHART 7.375 12/09/2021 1906   PCO2ART 36.3 12/09/2021 1906   PO2ART 182 (H) 12/09/2021 1906   HCO3 21.3 12/09/2021 1906   TCO2 22 12/09/2021 1906   ACIDBASEDEF 4.0 (H) 12/09/2021 1906   O2SAT 85.9 12/10/2021 1140     Coagulation Profile: Recent Labs  Lab 12/06/21 1048 12/08/21 1955 12/09/21 0557 12/09/21 1414  INR 1.1 2.1* 1.9* 1.4*    Cardiac Enzymes: No results for input(s): CKTOTAL, CKMB, CKMBINDEX, TROPONINI in the last 168 hours.  HbA1C: Hgb A1c MFr Bld  Date/Time  Value Ref Range Status  12/03/2021 06:11 PM 7.3 (H) 4.8 - 5.6 % Final    Comment:    (NOTE) Pre diabetes:          5.7%-6.4%  Diabetes:              >6.4%  Glycemic control for   <7.0% adults with diabetes   07/16/2010 04:17 PM (H) <5.7 % Final   8.3 (NOTE)                                                                       According to the ADA Clinical Practice Recommendations for 2011, when HbA1c is used as a screening test:   >=6.5%   Diagnostic of Diabetes Mellitus           (if abnormal result  is confirmed)  5.7-6.4%   Increased risk of developing Diabetes Mellitus  References:Diagnosis and Classification of Diabetes Mellitus,Diabetes [id number] 1):S62-S69 and Standards of Medical Care in         Diabetes - 2011,Diabetes Care,2011,34  (Suppl 1):S11-S61.    CBG: Recent Labs  Lab 12/09/21 2006 12/10/21 0017 12/10/21 0511 12/10/21 0740 12/10/21 1118  GLUCAP 163* 198* 168* 155* 87    Critical care time: 90min    Kennieth Rad, ACNP [facility] 12/10/2021, 12:39 PM  See Amion for pager If no response to pager, please call PCCM consult pager After 7:00 pm call Elink

## 2021-12-10 NOTE — Progress Notes (Signed)
Hgb stable No AF Overall improved.

## 2021-12-10 NOTE — Progress Notes (Signed)
Patient ID: [name] [name], female   DOB: [birth year], 81 y.o.   MRN: [id number] TCTS Evening Rounds:  Hemodynamically stable off NE.  Sats 1d00% 4L .  UO adequate  CT output 210 cc today.  Requiring a lot of pain medication.

## 2021-12-10 NOTE — Progress Notes (Signed)
[address]       [place] [address]             [phone]                 2 Days Post-Op Procedure(s) (LRB): CORONARY ARTERY BYPASS GRAFTING (CABG) X 3, ON PUMP< USING LEFT INTERNAL MAMMARY ARTERY AND RIGHT ENDOSCOPIC GREATER SAPHENOUS VEIN CONDUITS (N/A) TRANSESOPHAGEAL ECHOCARDIOGRAM (TEE) (N/A) APPLICATION OF CELL SAVER ENDOVEIN HARVEST OF GREATER SAPHENOUS VEIN (Right)   Events: Extubated yesterday Complains of pain ____________________________________________ Vitals: BP (!) 114/48 (BP Location: Right Arm)    Pulse 100    Temp 98.8 F (37.1 C)    Resp 17    Ht 5\' 2"  (1.575 m)    Wt 77.4 kg    SpO2 97%    BMI 31.21 kg/m  Filed Weights   12/02/21 1039 12/07/21 0359 12/10/21 0500  Weight: 66.7 kg 67.2 kg 77.4 kg     - Neuro: alert NAD  - Cardiovascular: sinus  Drips: milr 0.25, levo 6 CVP:  [0 mmHg-12 mmHg] 11 mmHg CO:  [4.3 L/min] 4.3 L/min CI:  [2.3 L/min/m2] 2.3 L/min/m2  - Pulm:  EWOB SS CT output  ABG    Component Value Date/Time   PHART 7.375 12/09/2021 1906   PCO2ART 36.3 12/09/2021 1906   PO2ART 182 (H) 12/09/2021 1906   HCO3 21.3 12/09/2021 1906   TCO2 22 12/09/2021 1906   ACIDBASEDEF 4.0 (H) 12/09/2021 1906   O2SAT 100.0 12/09/2021 1906    - Abd: ND - Extremity: warm  .Intake/Output      01/24 0701 01/25 0700 01/25 0701 01/26 0700   I.V. (mL/kg) 1213.1 (15.7) 306.2 (4)   Blood 1311    IV Piggyback 261.9 100   Total Intake(mL/kg) 2786 (36) 406.2 (5.2)   Urine (mL/kg/hr) 1520 (0.8) 40 (0.3)   Emesis/NG output 200    Blood     Chest Tube 1170 50   Total Output 2890 90   Net -104.1 +316.2           _______________________________________________________________ Labs: CBC Latest Ref Rng & Units 12/10/2021 12/09/2021 12/09/2021  WBC 4.0 - 10.5 K/uL 4.5 - -  Hemoglobin 12.0 - 15.0 g/dL 9.3(L) 7.8(L) 8.5(L)  Hematocrit 36.0 - 46.0 % 26.6(L) 23.0(L) 25.0(L)  Platelets 150 - 400 K/uL 113(L) - -   CMP Latest Ref  Rng & Units 12/10/2021 12/09/2021 12/09/2021  Glucose 70 - 99 mg/dL 190(H) - -  BUN 8 - 23 mg/dL 6(L) - -  Creatinine 0.44 - 1.00 mg/dL 0.84 - -  Sodium 135 - 145 mmol/L 131(L) 140 138  Potassium 3.5 - 5.1 mmol/L 3.8 3.6 3.8  Chloride 98 - 111 mmol/L 105 - -  CO2 22 - 32 mmol/L 20(L) - -  Calcium 8.9 - 10.3 mg/dL 7.2(L) - -  Total Protein 6.5 - 8.1 g/dL - - -  Total Bilirubin 0.3 - 1.2 mg/dL - - -  Alkaline Phos 38 - 126 U/L - - -  AST 15 - 41 U/L - - -  ALT 0 - 44 U/L - - -    CXR: clear  _______________________________________________________________  Assessment and Plan: POD 2 s/p CABG  Neuro: will adjust pain medication CV: weaning milr and levo.   On Asp.  Allergic to statin Pulm: will keep Cts, pulm hygiene Renal: creat stable.  Gentle diuresis once off levo GI: clears today while on levo Heme: H/H stable ID: afebrile  Endo: SSI Dispo: continue ICU care   Lajuana Matte 12/10/2021 8:54 AM

## 2021-12-11 ENCOUNTER — Inpatient Hospital Stay (HOSPITAL_COMMUNITY): Payer: Medicare PPO

## 2021-12-11 DIAGNOSIS — Z951 Presence of aortocoronary bypass graft: Secondary | ICD-10-CM | POA: Diagnosis not present

## 2021-12-11 DIAGNOSIS — E119 Type 2 diabetes mellitus without complications: Secondary | ICD-10-CM | POA: Diagnosis not present

## 2021-12-11 DIAGNOSIS — I2510 Atherosclerotic heart disease of native coronary artery without angina pectoris: Secondary | ICD-10-CM | POA: Diagnosis not present

## 2021-12-11 DIAGNOSIS — D62 Acute posthemorrhagic anemia: Secondary | ICD-10-CM | POA: Diagnosis not present

## 2021-12-11 DIAGNOSIS — D500 Iron deficiency anemia secondary to blood loss (chronic): Secondary | ICD-10-CM

## 2021-12-11 LAB — BASIC METABOLIC PANEL
Anion gap: 3 — ABNORMAL LOW (ref 5–15)
BUN: 11 mg/dL (ref 8–23)
CO2: 25 mmol/L (ref 22–32)
Calcium: 7.5 mg/dL — ABNORMAL LOW (ref 8.9–10.3)
Chloride: 107 mmol/L (ref 98–111)
Creatinine, Ser: 0.78 mg/dL (ref 0.44–1.00)
GFR, Estimated: >60 mL/min (ref >60)
Glucose, Bld: 166 mg/dL — ABNORMAL HIGH (ref 70–99)
Potassium: 4 mmol/L (ref 3.5–5.1)
Sodium: 135 mmol/L (ref 135–145)

## 2021-12-11 LAB — GLUCOSE, CAPILLARY
Glucose-Capillary: 104 mg/dL — ABNORMAL HIGH (ref 70–99)
Glucose-Capillary: 113 mg/dL — ABNORMAL HIGH (ref 70–99)
Glucose-Capillary: 138 mg/dL — ABNORMAL HIGH (ref 70–99)
Glucose-Capillary: 144 mg/dL — ABNORMAL HIGH (ref 70–99)
Glucose-Capillary: 153 mg/dL — ABNORMAL HIGH (ref 70–99)
Glucose-Capillary: 160 mg/dL — ABNORMAL HIGH (ref 70–99)

## 2021-12-11 LAB — CBC
HCT: 23.6 % — ABNORMAL LOW (ref 36.0–46.0)
Hemoglobin: 8 g/dL — ABNORMAL LOW (ref 12.0–15.0)
MCH: 30.2 pg (ref 26.0–34.0)
MCHC: 33.9 g/dL (ref 30.0–36.0)
MCV: 89.1 fL (ref 80.0–100.0)
Platelets: 111 10*3/uL — ABNORMAL LOW (ref 150–400)
RBC: 2.65 MIL/uL — ABNORMAL LOW (ref 3.87–5.11)
RDW: 16.4 % — ABNORMAL HIGH (ref 11.5–15.5)
WBC: 5.6 10*3/uL (ref 4.0–10.5)
nRBC: 0 % (ref 0.0–0.2)

## 2021-12-11 LAB — ECHO INTRAOPERATIVE TEE
Height: 62 in
Weight: 2370.39 oz

## 2021-12-11 LAB — COOXEMETRY PANEL
Carboxyhemoglobin: 1.3 % (ref 0.5–1.5)
Methemoglobin: 1.1 % (ref 0.0–1.5)
O2 Saturation: 68.4 %
Total hemoglobin: 8.1 g/dL — ABNORMAL LOW (ref 12.0–16.0)

## 2021-12-11 MED ORDER — PANTOPRAZOLE SODIUM 40 MG PO TBEC
40.0000 mg | DELAYED_RELEASE_TABLET | Freq: Two times a day (BID) | ORAL | Status: DC
  Administered 2021-12-11 – 2021-12-16 (×9): 40 mg via ORAL
  Filled 2021-12-11 (×10): qty 1

## 2021-12-11 MED ORDER — POLYVINYL ALCOHOL 1.4 % OP SOLN
1.0000 [drp] | OPHTHALMIC | Status: DC | PRN
  Administered 2021-12-11: 1 [drp] via OPHTHALMIC
  Filled 2021-12-11: qty 15

## 2021-12-11 NOTE — Progress Notes (Signed)
Pacer wires removed per order, no complications following removal. VSS, pt understands to call for assistance if any shortness of breath, chest pain or other discomforts occur. Call bell within reach, no other needs expressed at this time.

## 2021-12-11 NOTE — Anesthesia Postprocedure Evaluation (Signed)
Anesthesia Post Note  Patient: [name] [name]  Procedure(s) Performed: CORONARY ARTERY BYPASS GRAFTING (CABG) X 3, ON PUMP< USING LEFT INTERNAL MAMMARY ARTERY AND RIGHT ENDOSCOPIC GREATER SAPHENOUS VEIN CONDUITS (Chest) TRANSESOPHAGEAL ECHOCARDIOGRAM (TEE) APPLICATION OF CELL SAVER ENDOVEIN HARVEST OF GREATER SAPHENOUS VEIN (Right: Leg Upper)     Patient location during evaluation: SICU Anesthesia Type: General Level of consciousness: sedated Pain management: pain level controlled Vital Signs Assessment: post-procedure vital signs reviewed and stable Respiratory status: patient remains intubated per anesthesia plan Cardiovascular status: stable Postop Assessment: no apparent nausea or vomiting Anesthetic complications: no   No notable events documented.  Last Vitals:  Vitals:   12/11/21 0630 12/11/21 0700  BP: (!) 96/50 (!) 103/57  Pulse:    Resp: 18 19  Temp: 37.7 C 37.6 C  SpO2:      Last Pain:  Vitals:   12/11/21 0555  TempSrc:   PainSc: [place]

## 2021-12-11 NOTE — Progress Notes (Signed)
° °   °  [address]       [place] [address]             [phone]    POD # 3 CABG  Resting comfortably  BP 107/79    Pulse 93    Temp 98.4 F (36.9 C) (Oral)    Resp 16    Ht 5\' 2"  (1.575 m)    Wt 71.4 kg    SpO2 96%    BMI 28.81 kg/m    2L [place] 96% sat  CBG well controlled, no PM labs  Jaquez Farrington C. Dorris Fetch, MD [facility] [phone]

## 2021-12-11 NOTE — Progress Notes (Signed)
NAME:  [name] [name], MRN:  [id number], DOB:  07/27/1941, LOS: 9 ADMISSION DATE:  12/02/2021, CONSULTATION DATE:  12/09/2021 REFERRING MD:  Kipp Brood - TCTS CHIEF COMPLAINT:  Post-CABG   History of Present Illness:  81 year old woman with a history of CAD who presented with anginal symptoms of chest heaviness, pain between her shoulders, overall uneasiness, edema of lower extremities, shortness of breath who presented for elective heart catheterization.  She was found to have three-vessel coronary artery disease.  LVEF 55 to 60%.  She underwent elective CABG on 1/23.  Pertinent Medical History:  RA DM CAD HLD with statin intolerance, remote history of tobacco abuse  Significant Hospital Events: Including procedures, antibiotic start and stop dates in addition to other pertinent events   1/23 CABG, did not tolerate protamine reversal 1/24 blood output from chest tubes, getting several transfusions 1/26 Off of pressors. Midodrine off today. Up to chair, improved pain. SLP evaluation.  Interim History / Subjective:  Feeling better this morning Less pain, "still there but not as bad" Up to chair on rounds, tolerating well Off of pressors Midodrine turned off this AM SLP evaluation for dysphagia  Objective:  Blood pressure (!) 103/57, pulse 96, temperature 99.7 F (37.6 C), resp. rate 19, height 5\' 2"  (1.575 m), weight 71.4 kg, SpO2 (!) 82 %. CVP:  [4 mmHg-16 mmHg] 16 mmHg     Intake/Output Summary (Last 24 hours) at 12/11/2021 0723 Last data filed at 12/11/2021 0700 Gross per 24 hour  Intake 934.13 ml  Output 990 ml  Net -55.87 ml    Filed Weights   12/07/21 0359 12/10/21 0500 12/11/21 0500  Weight: 67.2 kg 77.4 kg 71.4 kg   Physical Examination: General: Chronically ill-appearing elderly female in NAD. Up to chair at bedside. HEENT: [place]/AT, anicteric sclera, PERRL, moist mucous membranes. Neuro: Awake, oriented x 4. Responds to verbal stimuli. Following commands  consistently. Moves all 4 extremities spontaneously. CV: Mildly tachycardic, no m/g/r. Midline sternotomy with dressing c/d/I. CT x 3. PULM: Breathing even and minimally labored on 2L . Lung fields diminished at bilateral bases 2/2 splinting, CTAB in upper fields. GI: Soft, nontender, nondistended. Normoactive bowel sounds. Extremities: Trace symmetric BLE edema noted. Skin: Warm/dry, no rashes. Midline sternotomy as above.  Resolved Hospital Problem List:    Assessment & Plan:   CAD, 3-vessel disease, s/p CABG Complicated by post-op coagulopathy and bleeding/ S/p perioperative Ancef course. - Primary management by TCTS - Off of pressors this AM (1/26) - Milrinone off this morning - Diuresis once milrinone washed out - CT management per TCTS - Pain control per protocol; oxycodone/morphine/tramadol - Adjunct medications including lidocaine patches, Robaxin  Acute blood loss anemia - Trend H&H - Transfuse for Hgb < 7.0 or hemodynamically significant bleeding - Monitor CT output  Post-op vent management Extubated 1/24. - Continue supplemental O2 support - Wean O2 sat > 90% - Pulmonary hygiene; IS/flutter - Adequate pain control to encourage better pulm toileting  Dysphagia Present PTA, persistent post-extubation - SLP evaluation - Consider Nutrition evaluation as well  Hyperglycemia - CBGs Q4H - SSI  Anemia, coagulopathy, thrombocytopenia due to intra-op anticoagulation, expected operative blood loss - Trend CBC - Trend coags  Remote history of tobacco abuse, no evidence of COPD on exam - Outside recommended age for routine lung CA screening, no referral needed  Best Practice: (right click and "Reselect all SmartList Selections" daily)   Diet/type: NPO-> clears only  DVT prophylaxis: SCD GI prophylaxis: PPI Lines: Central line and Arterial  Line Foley:  Yes, and it is still needed Code Status:  full code Last date of multidisciplinary goals of care discussion:  per primary   Critical care time: N/A   Rhae Lerner [facility] 12/11/21 7:24 AM  Please see [email or web address] for pager details.  From 7A-7P if no response, please call [phone] After hours, please call ELink [phone]

## 2021-12-11 NOTE — Evaluation (Signed)
Clinical/Bedside Swallow Evaluation Patient Details  Name: [name] [name] MRN: [id number] Date of Birth: Sep 16, 1941  Today's Date: 12/11/2021 Time: SLP Start Time (ACUTE ONLY): [id number] SLP Stop Time (ACUTE ONLY): 1425 SLP Time Calculation (min) (ACUTE ONLY): 21 min  Past Medical History:  Past Medical History:  Diagnosis Date   CAD (coronary artery disease)    Catheterization 2004, 60% distal LAD, 80% ostial circumflex( not optimal for PCI), 70% small RCA, medical therapy  /  nuclear June, 2005, EF 65%, no ischemia   Carotid artery disease ([place])    Doppler, November, 2011, no significant plaque, distal R. ICA velocities are elevated and could be source of bruit, 0-39% bilateral   Diabetes mellitus    Drug therapy    Intermittent steroid use   Dyslipidemia    Edema    Ejection fraction    EF 60%, echo, November, 2011, trivial pericardial effusion   Rheumatoid arthritis(714.0)    Hospitalization August, 2011, severe RA flare,    Statin intolerance    Past Surgical History:  Past Surgical History:  Procedure Laterality Date   ABDOMINAL HYSTERECTOMY     CHOLECYSTECTOMY     CORONARY ARTERY BYPASS GRAFT N/A 12/08/2021   Procedure: CORONARY ARTERY BYPASS GRAFTING (CABG) X 3, ON PUMP< USING LEFT INTERNAL MAMMARY ARTERY AND RIGHT ENDOSCOPIC GREATER SAPHENOUS VEIN CONDUITS;  Surgeon: Lajuana Matte, MD;  Location: [facility];  Service: Open Heart Surgery;  Laterality: N/A;   ENDOVEIN HARVEST OF GREATER SAPHENOUS VEIN Right 12/08/2021   Procedure: ENDOVEIN HARVEST OF GREATER SAPHENOUS VEIN;  Surgeon: Lajuana Matte, MD;  Location: [facility];  Service: Open Heart Surgery;  Laterality: Right;   LEFT HEART CATH AND CORONARY ANGIOGRAPHY N/A 01/10/2019   Procedure: LEFT HEART CATH AND CORONARY ANGIOGRAPHY;  Surgeon: Martinique, Peter M, MD;  Location: [facility];  Service: Cardiovascular;  Laterality: N/A;   LEFT HEART CATH AND CORONARY ANGIOGRAPHY N/A 12/02/2021   Procedure: LEFT HEART CATH  AND CORONARY ANGIOGRAPHY;  Surgeon: Belva Crome, MD;  Location: [facility];  Service: Cardiovascular;  Laterality: N/A;   TEE WITHOUT CARDIOVERSION N/A 12/08/2021   Procedure: TRANSESOPHAGEAL ECHOCARDIOGRAM (TEE);  Surgeon: Lajuana Matte, MD;  Location: [facility];  Service: Open Heart Surgery;  Laterality: N/A;   HPI:  81 year old woman with a history of CAD who presented on 1/17 with anginal symptoms of chest heaviness, pain between her shoulders, overall uneasiness, edema of lower extremities, shortness of breath and underwent elective heart catheterization.  She was found to have three-vessel coronary artery disease.  LVEF 55 to 60%.  She underwent elective CABG x3  on 1/23. PMHx RA, DM, CAD. Hx of esophageal issues, reflux. RN reports regurgitation with POs.  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  CT cervical spine 2015 C5-6 degenerative disc disease with associated reversed lordosis.    Assessment / Plan / Recommendation  Clinical Impression  Pt presented with functional swallowing with no s/s of an oropharyngeal dysphagia today, however she describes hx of esophageal deficits.  She has been belching a bit with meals and RN described some regurgitation.  There were no overt s/s of aspiration. Pt consumed liquids and purees without difficulty. She declined solids due to absence of dentures - she agrees to call  her daughter to ask her to bring teeth to hospital.  In the meantime, she agree to try a chopped/dysphagia 2 diet; thin liquids. Meds can be  given one at a time with liquids.  When appropriate, she may benefit from another EGD (last endoscopy was 2017, at which time she had an esophageal web.) SLP will follow briefly for safety/diet progression. SLP Visit Diagnosis: Dysphagia, unspecified (R13.10)    Aspiration Risk  No limitations    Diet Recommendation   Dysphagia 2, thin liquids  Medication Administration: Whole  meds with liquid    Other  Recommendations Oral Care Recommendations: Oral care BID    Recommendations for follow up therapy are one component of a multi-disciplinary discharge planning process, led by the attending physician.  Recommendations may be updated based on patient status, additional functional criteria and insurance authorization.  Follow up Recommendations No SLP follow up      Assistance Recommended at Discharge    Functional Status Assessment    Frequency and Duration min 2x/week  1 week       Prognosis Prognosis for Safe Diet Advancement: Good      Swallow Study   General Date of Onset: 12/11/21 HPI: 81 year old woman with a history of CAD who presented on 1/17 with anginal symptoms of chest heaviness, pain between her shoulders, overall uneasiness, edema of lower extremities, shortness of breath and underwent elective heart catheterization.  She was found to have three-vessel coronary artery disease.  LVEF 55 to 60%.  She underwent elective CABG x3  on 1/23. PMHx RA, DM, CAD. Hx of esophageal issues, reflux. RN reports regurgitation with POs.  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  CT cervical spine 2015 C5-6 degenerative disc disease with associated reversed lordosis. Type of Study: Bedside Swallow Evaluation Previous Swallow Assessment: no Diet Prior to this Study: Thin liquids Temperature Spikes Noted: No History of Recent Intubation:  (for procedure) Behavior/Cognition: Alert;Cooperative;Pleasant mood Oral Cavity Assessment: Within Functional Limits Oral Care Completed by SLP: Recent completion by staff Oral Cavity - Dentition: Edentulous (dentures not in room) Vision: Functional for self-feeding Self-Feeding Abilities: Able to feed self;Needs assist Patient Positioning: Upright in bed Baseline Vocal Quality: Normal Volitional Swallow: Able to elicit    Oral/Motor/Sensory Function Overall Oral  Motor/Sensory Function: Within functional limits   Ice Chips Ice chips: Within functional limits   Thin Liquid Thin Liquid: Within functional limits Presentation: Straw    Nectar Thick Nectar Thick Liquid: Not tested   Honey Thick Honey Thick Liquid: Not tested   Puree Puree: Within functional limits   Solid     Solid: Not tested (pt declined due to absence of dentures)      [name] 12/11/2021,3:05 PM  Estill Bamberg L. Tivis Ringer, [place] Office number [phone] Pager [phone]

## 2021-12-11 NOTE — Progress Notes (Signed)
Somewhat tachycardic but hemodynamically stable.  Pressors are being weaned. No atrial fibrillation Improved with stable hemoglobin.

## 2021-12-11 NOTE — Evaluation (Signed)
Physical Therapy Evaluation Patient Details Name: [name] [name] MRN: [id number] DOB: 10/20/1941 Today's Date: 12/11/2021  History of Present Illness  Pt is an 81 y.o. female who presented 12/02/21 for elective heart cath secondary to anginal symptoms of chest heaviness, pain between her shoulders, SOB, lower extremity edema. Found to have 3-vessel CAD, LVEF 55-60%. S/p CABG x3 1/23. PMH: CAD, DM, RA   Clinical Impression  Pt presents with condition above and deficits mentioned below, see PT Problem List. PTA, she was independent without an AD, living alone in a house with 7 STE and with a basement she frequently uses as her workshop. Currently, pt displays lower extremity weakness bil along with deficits in balance, activity tolerance, and trunk extension ROM. Pt is currently requiring modA for bed mobility, modA+2 to transfer to stand, and min-modA to ambulate bedroom distances with a RW. She is at high risk for falls. Encouraged pt to perform quad and glut sets as HEP to improve her leg strength. Provided education on sternal precautions. Pt reported her brother can come live with her initially to assist her if needed. Due to her significant change in functional status and motivation to improve, recommending pt follow-up with intensive therapy at [facility] to maximize her return to baseline. Will continue to follow acutely.     Recommendations for follow up therapy are one component of a multi-disciplinary discharge planning process, led by the attending physician.  Recommendations may be updated based on patient status, additional functional criteria and insurance authorization.  Follow Up Recommendations Acute inpatient rehab (3hours/day)    Assistance Recommended at Discharge Frequent or constant Supervision/Assistance  Patient can return home with the following  A lot of help with walking and/or transfers;A lot of help with bathing/dressing/bathroom;Assistance with cooking/housework;Assist for  transportation;Help with stairs or ramp for entrance    Equipment Recommendations Rolling walker (2 wheels);Other (comment) (shower stool)  Recommendations for Other Services  Rehab consult;OT consult    Functional Status Assessment Patient has had a recent decline in their functional status and demonstrates the ability to make significant improvements in function in a reasonable and predictable amount of time.     Precautions / Restrictions Precautions Precautions: Fall;Sternal;Other (comment) Precaution Booklet Issued: Yes (comment) Precaution Comments: Y-chest tube central abdomen; reviewed sternal precautions Restrictions Weight Bearing Restrictions: No      Mobility  Bed Mobility Overal bed mobility: Needs Assistance Bed Mobility: Supine to Sit     Supine to sit: Mod assist, +2 for safety/equipment, HOB elevated     General bed mobility comments: RN present and assisting occasionally throughout. Cues provided to bring legs laterally off EOB and keep hands at chest for sternal precautions. ModA to completely manage legs off EOB and ascend trunk, scooting hips to EOB using bed pad.    Transfers Overall transfer level: Needs assistance Equipment used: Rolling walker (2 wheels) Transfers: Sit to/from Stand, Bed to chair/wheelchair/BSC Sit to Stand: Mod assist, +2 physical assistance, +2 safety/equipment, From elevated surface   Step pivot transfers: Mod assist, +2 safety/equipment       General transfer comment: Practiced initiating sit to stand transfers at elevated EOB 5x, beginning to clear buttocks each rep. Completed full sit to stand with modAx2 to extend trunk and boost to stand. ModA to manage RW and steady pt with cues for sequencing steps to transfer stand step to L bed > recliner with RW. Cues for hand placement on lap to maintain sternal precautions with sit <> stand transfers.  Ambulation/Gait Ambulation/Gait assistance: Mod assist, Min assist, +2  safety/equipment Gait Distance (Feet): 23 Feet Assistive device: Rolling walker (2 wheels) Gait Pattern/deviations: Step-through pattern, Decreased step length - right, Decreased step length - left, Decreased stride length, Trunk flexed, Shuffle Gait velocity: reduced Gait velocity interpretation: <1.31 ft/sec, indicative of household ambulator   General Gait Details: Pt with slow, short shuffling steps laterally to chair then anterior <> posterior in front of chair. Cues provided repeatedly to look superiorly to improve posture and to increase stride length with min success. Progressed from [place] to [place], but required mod cues to manage RW when stepping posteriorly.  Stairs            Wheelchair Mobility    Modified Rankin (Stroke Patients Only)       Balance Overall balance assessment: Needs assistance Sitting-balance support: No upper extremity supported, Feet supported Sitting balance-Leahy Scale: Fair Sitting balance - Comments: Static sitting EOB with supervision for safety.   Standing balance support: Bilateral upper extremity supported, During functional activity, Reliant on assistive device for balance Standing balance-Leahy Scale: Poor Standing balance comment: Reliant on RW and minA to stand statically.                             Pertinent Vitals/Pain Pain Assessment Pain Assessment: Faces Faces Pain Scale: Hurts little more Pain Location: chest at sternal incision Pain Descriptors / Indicators: Discomfort, Grimacing, Operative site guarding Pain Intervention(s): Limited activity within patient's tolerance, Monitored during session, Repositioned    Home Living Family/patient expects to be discharged to:: Private residence Living Arrangements: Alone Available Help at Discharge: Family;Available 24 hours/day (initially, brother from [facility] can live with her if needed) Type of Home: House Home Access: Stairs to enter Entrance Stairs-Rails: Left  (ascending) Entrance Stairs-Number of Steps: 7   Home Layout: One level;Laundry or work area in [employer]: BSC/3in1;Grab bars - tub/shower      Prior Function Prior Level of Function : Independent/Modified Independent;Driving             Mobility Comments: Does not use an AD.       Hand Dominance        Extremity/Trunk Assessment   Upper Extremity Assessment Upper Extremity Assessment: Defer to OT evaluation    Lower Extremity Assessment Lower Extremity Assessment: Generalized weakness (denies numbness/tingling bil)    Cervical / Trunk Assessment Cervical / Trunk Assessment: Kyphotic  Communication   Communication: No difficulties  Cognition Arousal/Alertness: Awake/alert Behavior During Therapy: WFL for tasks assessed/performed Overall Cognitive Status: Within Functional Limits for tasks assessed                                 General Comments: Follows cues appropriately for tasks performed this date.        General Comments General comments (skin integrity, edema, etc.): poor waveform but appeared VSS on RA when ambulating, donned [place] end of session per RN request    Exercises     Assessment/Plan    PT Assessment Patient needs continued PT services  PT Problem List Decreased strength;Decreased range of motion;Decreased activity tolerance;Decreased balance;Decreased mobility;Decreased knowledge of use of DME;Decreased knowledge of precautions;Cardiopulmonary status limiting activity;Pain       PT Treatment Interventions DME instruction;Gait training;Stair training;Functional mobility training;Therapeutic activities;Therapeutic exercise;Balance training;Neuromuscular re-education;Patient/family education    PT Goals (Current goals can be found in the Care Plan  section)  Acute Rehab PT Goals Patient Stated Goal: to get better PT Goal Formulation: With patient Time For Goal Achievement: 12/25/21 Potential to Achieve Goals:  Good    Frequency Min 3X/week     Co-evaluation               AM-PAC PT "6 Clicks" Mobility  Outcome Measure Help needed turning from your back to your side while in a flat bed without using bedrails?: A Lot Help needed moving from lying on your back to sitting on the side of a flat bed without using bedrails?: A Lot Help needed moving to and from a bed to a chair (including a wheelchair)?: A Lot Help needed standing up from a chair using your arms (e.g., wheelchair or bedside chair)?: Total Help needed to walk in hospital room?: A Lot Help needed climbing 3-5 steps with a railing? : Total 6 Click Score: 10    End of Session Equipment Utilized During Treatment: Gait belt Activity Tolerance: Patient tolerated treatment well Patient left: in chair;with call bell/phone within reach Nurse Communication: Mobility status PT Visit Diagnosis: Unsteadiness on feet (R26.81);Other abnormalities of gait and mobility (R26.89);Muscle weakness (generalized) (M62.81);Difficulty in walking, not elsewhere classified (R26.2);Pain Pain - part of body:  (chest)    Time: [id number] PT Time Calculation (min) (ACUTE ONLY): 30 min   Charges:   PT Evaluation $PT Eval Moderate Complexity: 1 Mod PT Treatments $Therapeutic Activity: 8-22 mins        Moishe Spice, PT, DPT Acute Rehabilitation Services  Pager: [phone] Office: [phone]'  Maretta Bees Pettis 12/11/2021, 3:20 PM

## 2021-12-11 NOTE — Plan of Care (Signed)
°  Problem: Education: °Goal: Knowledge of General Education information will improve °Description: Including pain rating scale, medication(s)/side effects and non-pharmacologic comfort measures °Outcome: Progressing °  °Problem: Health Behavior/Discharge Planning: °Goal: Ability to manage health-related needs will improve °Outcome: Progressing °  °Problem: Clinical Measurements: °Goal: Ability to maintain clinical measurements within normal limits will improve °Outcome: Progressing °Goal: Will remain free from infection °Outcome: Progressing °Goal: Diagnostic test results will improve °Outcome: Progressing °Goal: Respiratory complications will improve °Outcome: Progressing °Goal: Cardiovascular complication will be avoided °Outcome: Progressing °  °Problem: Activity: °Goal: Risk for activity intolerance will decrease °Outcome: Progressing °  °Problem: Nutrition: °Goal: Adequate nutrition will be maintained °Outcome: Progressing °  °Problem: Coping: °Goal: Level of anxiety will decrease °Outcome: Progressing °  °Problem: Elimination: °Goal: Will not experience complications related to bowel motility °Outcome: Progressing °Goal: Will not experience complications related to urinary retention °Outcome: Progressing °  °Problem: Pain Managment: °Goal: General experience of comfort will improve °Outcome: Progressing °  °Problem: Safety: °Goal: Ability to remain free from injury will improve °Outcome: Progressing °  °Problem: Skin Integrity: °Goal: Risk for impaired skin integrity will decrease °Outcome: Progressing °  °Problem: Education: °Goal: Understanding of CV disease, CV risk reduction, and recovery process will improve °Outcome: Progressing °Goal: Individualized Educational Video(s) °Outcome: Progressing °  °Problem: Activity: °Goal: Ability to return to baseline activity level will improve °Outcome: Progressing °  °Problem: Cardiovascular: °Goal: Ability to achieve and maintain adequate cardiovascular perfusion  will improve °Outcome: Progressing °Goal: Vascular access site(s) Level 0-1 will be maintained °Outcome: Progressing °  °Problem: Health Behavior/Discharge Planning: °Goal: Ability to safely manage health-related needs after discharge will improve °Outcome: Progressing °  °Problem: Education: °Goal: Will demonstrate proper wound care and an understanding of methods to prevent future damage °Outcome: Progressing °Goal: Knowledge of disease or condition will improve °Outcome: Progressing °Goal: Knowledge of the prescribed therapeutic regimen will improve °Outcome: Progressing °Goal: Individualized Educational Video(s) °Outcome: Progressing °  °Problem: Activity: °Goal: Risk for activity intolerance will decrease °Outcome: Progressing °  °Problem: Cardiac: °Goal: Will achieve and/or maintain hemodynamic stability °Outcome: Progressing °  °Problem: Clinical Measurements: °Goal: Postoperative complications will be avoided or minimized °Outcome: Progressing °  °Problem: Respiratory: °Goal: Respiratory status will improve °Outcome: Progressing °  °Problem: Skin Integrity: °Goal: Wound healing without signs and symptoms of infection °Outcome: Progressing °Goal: Risk for impaired skin integrity will decrease °Outcome: Progressing °  °Problem: Urinary Elimination: °Goal: Ability to achieve and maintain adequate renal perfusion and functioning will improve °Outcome: Progressing °  °Problem: Safety: °Goal: Non-violent Restraint(s) °Outcome: Progressing °  °

## 2021-12-11 NOTE — Progress Notes (Signed)
° °   °  [address]       [place] [address]             [phone]                 3 Days Post-Op Procedure(s) (LRB): CORONARY ARTERY BYPASS GRAFTING (CABG) X 3, ON PUMP< USING LEFT INTERNAL MAMMARY ARTERY AND RIGHT ENDOSCOPIC GREATER SAPHENOUS VEIN CONDUITS (N/A) TRANSESOPHAGEAL ECHOCARDIOGRAM (TEE) (N/A) APPLICATION OF CELL SAVER ENDOVEIN HARVEST OF GREATER SAPHENOUS VEIN (Right)   Events: No events ____________________________________________ Vitals: BP (!) 103/57    Pulse 96    Temp 99.7 F (37.6 C)    Resp 19    Ht 5\' 2"  (1.575 m)    Wt 71.4 kg    SpO2 (!) 82%    BMI 28.81 kg/m  Filed Weights   12/07/21 0359 12/10/21 0500 12/11/21 0500  Weight: 67.2 kg 77.4 kg 71.4 kg     - Neuro: alert NAD  - Cardiovascular: sinus  Drips: milr 0.125,  CVP:  [4 mmHg-16 mmHg] 16 mmHg  - Pulm:  EWOB SS CT output  ABG    Component Value Date/Time   PHART 7.375 12/09/2021 1906   PCO2ART 36.3 12/09/2021 1906   PO2ART 182 (H) 12/09/2021 1906   HCO3 21.3 12/09/2021 1906   TCO2 22 12/09/2021 1906   ACIDBASEDEF 4.0 (H) 12/09/2021 1906   O2SAT 68.4 12/11/2021 0412    - Abd: ND - Extremity: warm  .Intake/Output      01/25 0701 01/26 0700 01/26 0701 01/27 0700   I.V. (mL/kg) 634.1 (8.9)    Blood     IV Piggyback 300    Total Intake(mL/kg) 934.1 (13.1)    Urine (mL/kg/hr) 640 (0.4)    Emesis/NG output 30    Chest Tube 320    Total Output 990    Net -55.9            _______________________________________________________________ Labs: CBC Latest Ref Rng & Units 12/11/2021 12/10/2021 12/09/2021  WBC 4.0 - 10.5 K/uL 5.6 4.5 -  Hemoglobin 12.0 - 15.0 g/dL 8.0(L) 9.3(L) 7.8(L)  Hematocrit 36.0 - 46.0 % 23.6(L) 26.6(L) 23.0(L)  Platelets 150 - 400 K/uL 111(L) 113(L) -   CMP Latest Ref Rng & Units 12/11/2021 12/10/2021 12/09/2021  Glucose 70 - 99 mg/dL 166(H) 190(H) -  BUN 8 - 23 mg/dL 11 6(L) -  Creatinine 0.44 - 1.00 mg/dL 0.78 0.84 -  Sodium 135 - 145  mmol/L 135 131(L) 140  Potassium 3.5 - 5.1 mmol/L 4.0 3.8 3.6  Chloride 98 - 111 mmol/L 107 105 -  CO2 22 - 32 mmol/L 25 20(L) -  Calcium 8.9 - 10.3 mg/dL 7.5(L) 7.2(L) -  Total Protein 6.5 - 8.1 g/dL - - -  Total Bilirubin 0.3 - 1.2 mg/dL - - -  Alkaline Phos 38 - 126 U/L - - -  AST 15 - 41 U/L - - -  ALT 0 - 44 U/L - - -    CXR: clear  _______________________________________________________________  Assessment and Plan: POD 3 s/p CABG  Neuro: pain controlled CV: d/c milr.   On Asp.  Allergic to statin.  Holding BB for hypotension.  Will remove wires Pulm: will keep Cts, pulm hygiene Renal: creat stable.  Gentle diuresis once off levo GI: will advance diet Heme: H/H stable ID: [email or web address] Endo: SSI Dispo: continue ICU care   Lajuana Matte 12/11/2021 7:44 AM

## 2021-12-12 ENCOUNTER — Inpatient Hospital Stay (HOSPITAL_COMMUNITY): Payer: Medicare PPO

## 2021-12-12 DIAGNOSIS — Z794 Long term (current) use of insulin: Secondary | ICD-10-CM | POA: Diagnosis not present

## 2021-12-12 DIAGNOSIS — I2510 Atherosclerotic heart disease of native coronary artery without angina pectoris: Secondary | ICD-10-CM | POA: Diagnosis not present

## 2021-12-12 DIAGNOSIS — E119 Type 2 diabetes mellitus without complications: Secondary | ICD-10-CM | POA: Diagnosis not present

## 2021-12-12 LAB — CBC
HCT: 23.2 % — ABNORMAL LOW (ref 36.0–46.0)
Hemoglobin: 7.7 g/dL — ABNORMAL LOW (ref 12.0–15.0)
MCH: 30.7 pg (ref 26.0–34.0)
MCHC: 33.2 g/dL (ref 30.0–36.0)
MCV: 92.4 fL (ref 80.0–100.0)
Platelets: 147 10*3/uL — ABNORMAL LOW (ref 150–400)
RBC: 2.51 MIL/uL — ABNORMAL LOW (ref 3.87–5.11)
RDW: 16.2 % — ABNORMAL HIGH (ref 11.5–15.5)
WBC: 6.6 10*3/uL (ref 4.0–10.5)
nRBC: 0.3 % — ABNORMAL HIGH (ref 0.0–0.2)

## 2021-12-12 LAB — BASIC METABOLIC PANEL
Anion gap: 4 — ABNORMAL LOW (ref 5–15)
BUN: 13 mg/dL (ref 8–23)
CO2: 25 mmol/L (ref 22–32)
Calcium: 7.8 mg/dL — ABNORMAL LOW (ref 8.9–10.3)
Chloride: 107 mmol/L (ref 98–111)
Creatinine, Ser: 0.84 mg/dL (ref 0.44–1.00)
GFR, Estimated: >60 mL/min (ref >60)
Glucose, Bld: 135 mg/dL — ABNORMAL HIGH (ref 70–99)
Potassium: 3.9 mmol/L (ref 3.5–5.1)
Sodium: 136 mmol/L (ref 135–145)

## 2021-12-12 LAB — COOXEMETRY PANEL
Carboxyhemoglobin: 1.7 % — ABNORMAL HIGH (ref 0.5–1.5)
Methemoglobin: 0.9 % (ref 0.0–1.5)
O2 Saturation: 75.5 %
Total hemoglobin: 7.5 g/dL — ABNORMAL LOW (ref 12.0–16.0)

## 2021-12-12 LAB — GLUCOSE, CAPILLARY
Glucose-Capillary: 104 mg/dL — ABNORMAL HIGH (ref 70–99)
Glucose-Capillary: 115 mg/dL — ABNORMAL HIGH (ref 70–99)
Glucose-Capillary: 132 mg/dL — ABNORMAL HIGH (ref 70–99)
Glucose-Capillary: 132 mg/dL — ABNORMAL HIGH (ref 70–99)
Glucose-Capillary: 141 mg/dL — ABNORMAL HIGH (ref 70–99)
Glucose-Capillary: 141 mg/dL — ABNORMAL HIGH (ref 70–99)
Glucose-Capillary: 182 mg/dL — ABNORMAL HIGH (ref 70–99)

## 2021-12-12 MED ORDER — SODIUM CHLORIDE 0.9% FLUSH
3.0000 mL | Freq: Two times a day (BID) | INTRAVENOUS | Status: DC
  Administered 2021-12-12 – 2021-12-13 (×4): 3 mL via INTRAVENOUS

## 2021-12-12 MED ORDER — FUROSEMIDE 40 MG PO TABS
40.0000 mg | ORAL_TABLET | Freq: Every day | ORAL | Status: DC
  Administered 2021-12-13 – 2021-12-16 (×4): 40 mg via ORAL
  Filled 2021-12-12 (×4): qty 1

## 2021-12-12 MED ORDER — METOPROLOL TARTRATE 12.5 MG HALF TABLET
12.5000 mg | ORAL_TABLET | Freq: Two times a day (BID) | ORAL | Status: DC
  Administered 2021-12-12 – 2021-12-16 (×9): 12.5 mg via ORAL
  Filled 2021-12-12 (×9): qty 1

## 2021-12-12 MED ORDER — POTASSIUM CHLORIDE CRYS ER 20 MEQ PO TBCR
40.0000 meq | EXTENDED_RELEASE_TABLET | Freq: Every day | ORAL | Status: DC
  Administered 2021-12-12 – 2021-12-16 (×5): 40 meq via ORAL
  Filled 2021-12-12 (×5): qty 2

## 2021-12-12 MED ORDER — SODIUM CHLORIDE 0.9% FLUSH: 3.0000 mL | INTRAVENOUS | Status: DC | PRN

## 2021-12-12 MED ORDER — FUROSEMIDE 10 MG/ML IJ SOLN
40.0000 mg | Freq: Once | INTRAMUSCULAR | Status: AC
  Administered 2021-12-12: 40 mg via INTRAVENOUS
  Filled 2021-12-12: qty 4

## 2021-12-12 MED ORDER — ~~LOC~~ CARDIAC SURGERY, PATIENT & FAMILY EDUCATION: Freq: Once | Status: AC

## 2021-12-12 MED ORDER — SODIUM CHLORIDE 0.9 % IV SOLN: 250.0000 mL | INTRAVENOUS | Status: DC | PRN

## 2021-12-12 NOTE — Progress Notes (Signed)
Speech Language Pathology Treatment: Dysphagia  Patient Details Name: [name] [name] MRN: [id number] DOB: July 12, 1941 Today's Date: 12/12/2021 Time: 1601-0932 SLP Time Calculation (min) (ACUTE ONLY): 23 min  Assessment / Plan / Recommendation Clinical Impression  [name] [name] has her dentures today.  Assisted with lunch tray. She was willing and able to chew regular solid items, though had limited appetite.  Demonstrated functional swallowing overall with no concerns for aspiration. Advanced diet to regular/heart healthy.  She will likely need a f/u with her GI physician after discharge to address the benefit of another EGD.  Continue general esophageal precautions - eating upright, staying upright 30-45 min after meals, elevating HOB during sleep. Updated sign at [facility]. No further acute care SLP f/u is needed - our service will sign off. D/W RN and pt.     HPI HPI: 81 year old woman with a history of CAD who presented on 1/17 with anginal symptoms of chest heaviness, pain between her shoulders, overall uneasiness, edema of lower extremities, shortness of breath and underwent elective heart catheterization.  She was found to have three-vessel coronary artery disease.  LVEF 55 to 60%.  She underwent elective CABG x3  on 1/23. PMHx RA, DM, CAD. Hx of esophageal issues, reflux. RN reports regurgitation with POs.  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  CT cervical spine 2015 C5-6 degenerative disc disease with associated reversed lordosis.      SLP Plan  All goals met      Recommendations for follow up therapy are one component of a multi-disciplinary discharge planning process, led by the attending physician.  Recommendations may be updated based on patient status, additional functional criteria and insurance authorization.    Recommendations  Diet recommendations: Regular;Thin liquid Liquids provided via: Straw;Cup Medication  Administration: Whole meds with liquid Supervision: Patient able to self feed Postural Changes and/or Swallow Maneuvers: Seated upright 90 degrees;Upright 30-60 min after meal                Oral Care Recommendations: Oral care BID Follow Up Recommendations: No SLP follow up SLP Visit Diagnosis: Dysphagia, unspecified (R13.10) Plan: All goals met         [name] [name] L. [name] [name], [place] Office number [phone] Pager [phone]   [name] [name]  12/12/2021, 11:57 AM

## 2021-12-12 NOTE — Progress Notes (Signed)
CTS  Stable day Waiting for transfer to 4E  Blood pressure (!) 104/51, pulse 88, temperature 99 F (37.2 C), temperature source Oral, resp. rate 16, height 5\' 2"  (1.575 m), weight 74.7 kg, SpO2 100 %.

## 2021-12-12 NOTE — Progress Notes (Signed)
Physical Therapy Treatment Patient Details Name: [name] [name] MRN: [id number] DOB: [birth year] Today's Date: 12/12/2021   History of Present Illness Pt is an 81 y.o. female who presented 12/02/21 for elective heart cath secondary to anginal symptoms of chest heaviness, pain between her shoulders, SOB, lower extremity edema. Found to have 3-vessel CAD, LVEF 55-60%. S/p CABG x3 1/23. PMH: CAD, DM, RA    PT Comments    Pt is making good, steady progress with PT as she was able to ambulate an increased distance of up to ~100 ft with the eva-walker and min guard-minA today. However, she ambulates at a very slow pace with noted instability, indicating risk for falls. Pt also still requiring modA for transfers secondary to lower extremity weakness. Addressed this through seated and standing exercises and repeated transfers. Will continue to follow acutely. Current recommendations remain appropriate.   Recommendations for follow up therapy are one component of a multi-disciplinary discharge planning process, led by the attending physician.  Recommendations may be updated based on patient status, additional functional criteria and insurance authorization.  Follow Up Recommendations  Acute inpatient rehab (3hours/day)     Assistance Recommended at Discharge Frequent or constant Supervision/Assistance  Patient can return home with the following A lot of help with walking and/or transfers;A lot of help with bathing/dressing/bathroom;Assistance with cooking/housework;Assist for transportation;Help with stairs or ramp for entrance   Equipment Recommendations  Rolling walker (2 wheels);Other (comment) (shower stool)    Recommendations for Other Services       Precautions / Restrictions Precautions Precautions: Fall;Sternal Precaution Booklet Issued: Yes (comment) Precaution Comments: reviewed sternal precautions Restrictions Weight Bearing Restrictions: Yes Other Position/Activity Restrictions:  sternal precautions     Mobility  Bed Mobility Overal bed mobility: Needs Assistance Bed Mobility: Supine to Sit, Sit to Supine     Supine to sit: HOB elevated, Min assist Sit to supine: Max assist   General bed mobility comments: Cues provided to bring legs off EOB with pt needing extra time to complete. MinA to ascend trunk from elevated EOB and scoot hips to EOB.    Transfers Overall transfer level: Needs assistance Equipment used:  (eva walker) Transfers: Sit to/from Stand Sit to Stand: Mod assist, +2 safety/equipment, From elevated surface           General transfer comment: Practiced initiating sit to stand transfers at elevated EOB 3x, beginning to clear buttocks each rep. Completed full sit to stand with modA to extend trunk and boost to stand, 3x from EOB.    Ambulation/Gait Ambulation/Gait assistance: Min guard, Min assist Gait Distance (Feet): 100 Feet Assistive device: Ethelene Hal Gait Pattern/deviations: Step-through pattern, Decreased step length - right, Decreased step length - left, Decreased stride length, Trunk flexed Gait velocity: reduced Gait velocity interpretation: <1.31 ft/sec, indicative of household ambulator   General Gait Details: Pt with slow gait, needing cues to adduct scapulas and look superiorly to improve upright posture. Cues for increased stride length and foot clearance, mod success. No LOB, min guard assist when in open hallway but minA to negotiate eva walker in tight spaces, like doorways.   Stairs             Wheelchair Mobility    Modified Rankin (Stroke Patients Only)       Balance Overall balance assessment: Needs assistance Sitting-balance support: No upper extremity supported, Feet supported Sitting balance-Leahy Scale: Fair Sitting balance - Comments: Static sitting EOB with supervision for safety.   Standing balance support: Bilateral upper extremity  supported, During functional activity, No upper extremity  supported Standing balance-Leahy Scale: Fair Standing balance comment: Able to stand statically briefly without UE support but benefits from bil UE support to ambulate                            Cognition Arousal/Alertness: Awake/alert Behavior During Therapy: WFL for tasks assessed/performed Overall Cognitive Status: Within Functional Limits for tasks assessed                                 General Comments: Follows cues appropriately for tasks performed this date.        Exercises General Exercises - Lower Extremity Long Arc Quad: AROM, Strengthening, Both, 10 reps, Seated Mini-Sqauts: AROM, Strengthening, Both, 5 reps, Standing (with hands on knees) Other Exercises Other Exercises: Sit <> stand 3x from EOB and mini initiation of transfers to stand from sit at EOB 3x    General Comments General comments (skin integrity, edema, etc.): Unable to get pulse ox to read with gait, but VSS on 2L all other times      Pertinent Vitals/Pain Pain Assessment Pain Assessment: Faces Faces Pain Scale: Hurts a little bit Pain Location: chest and nausea with sitting Pain Descriptors / Indicators: Discomfort, Grimacing, Operative site guarding, Other (Comment) (nausea) Pain Intervention(s): Limited activity within patient's tolerance, Monitored during session, Repositioned, Other (comment) (RN gave pt meds for nausea)    Home Living                          Prior Function            PT Goals (current goals can now be found in the care plan section) Acute Rehab PT Goals Patient Stated Goal: to get better PT Goal Formulation: With patient Time For Goal Achievement: 12/25/21 Potential to Achieve Goals: Good Progress towards PT goals: Progressing toward goals    Frequency    Min 3X/week      PT Plan Current plan remains appropriate    Co-evaluation              AM-PAC PT "6 Clicks" Mobility   Outcome Measure  Help needed turning  from your back to your side while in a flat bed without using bedrails?: A Lot Help needed moving from lying on your back to sitting on the side of a flat bed without using bedrails?: A Lot Help needed moving to and from a bed to a chair (including a wheelchair)?: A Little Help needed standing up from a chair using your arms (e.g., wheelchair or bedside chair)?: A Lot Help needed to walk in hospital room?: A Little Help needed climbing 3-5 steps with a railing? : A Lot 6 Click Score: 14    End of Session Equipment Utilized During Treatment: Gait belt Activity Tolerance: Patient tolerated treatment well Patient left: with call bell/phone within reach;in bed;with bed alarm set Nurse Communication: Mobility status;Other (comment) (nausea) PT Visit Diagnosis: Unsteadiness on feet (R26.81);Other abnormalities of gait and mobility (R26.89);Muscle weakness (generalized) (M62.81);Difficulty in walking, not elsewhere classified (R26.2);Pain Pain - part of body:  (chest)     Time: [id number] PT Time Calculation (min) (ACUTE ONLY): 34 min  Charges:  $Gait Training: 8-22 mins $Therapeutic Exercise: 8-22 mins  Moishe Spice, PT, DPT Acute Rehabilitation Services  Pager: [phone] Office: [place] 12/12/2021, 1:00 PM

## 2021-12-12 NOTE — Progress Notes (Signed)
[name] [name] looks better today.  She is eating and has no complaints. No atrial fibrillation. Indwelling catheters being removed today and transferring.

## 2021-12-12 NOTE — Progress Notes (Signed)
NAME:  [name] [name], MRN:  [id number], DOB:  1941-02-28, LOS: 10 ADMISSION DATE:  12/02/2021, CONSULTATION DATE:  12/09/2021 REFERRING MD:  Cliffton Asters - TCTS CHIEF COMPLAINT:  Post-CABG   History of Present Illness:  81 year old woman with a history of CAD who presented with anginal symptoms of chest heaviness, pain between her shoulders, overall uneasiness, edema of lower extremities, shortness of breath who presented for elective heart catheterization.  She was found to have three-vessel coronary artery disease.  LVEF 55 to 60%.  She underwent elective CABG on 1/23.  Pertinent Medical History:  RA, T2DM, CAD, HLD with statin intolerance, remote history of tobacco abuse  Significant Hospital Events: Including procedures, antibiotic start and stop dates in addition to other pertinent events   1/23 CABG, did not tolerate protamine reversal 1/24 blood output from chest tubes, getting several transfusions 1/26 Off of pressors. Midodrine off today. Up to chair, improved pain. SLP evaluation, cleared for DYS2. 1/27 Remains off of pressors. CT removal and gentle diuresis per TCTS.  Interim History / Subjective:  Feeling well overall Up to chair at bedside Pain is better, breathing feels easier Remains off of milrinone Per TCTS, CT removal today Gentle diuresis in the setting of soft BP ADAT now that patient has dentures  Objective:  Blood pressure 116/63, pulse 99, temperature 98.6 F (37 C), temperature source Oral, resp. rate 12, height  (1.575 m), weight 74.7 kg, SpO2 100 %. CVP:  [3 mmHg-15 mmHg] 14 mmHg    Intake/Output Summary (Last 24 hours) at 12/12/2021 0743 Last data filed at 12/12/2021 0700 Gross per 24 hour  Intake 306.09 ml  Output 600 ml  Net -293.91 ml    Filed Weights   12/10/21 0500 12/11/21 0500 12/12/21 0500  Weight: 77.4 kg 71.4 kg 74.7 kg   Physical Examination: General: Chronically ill-appearing elderly woman in NAD. Up to chair. HEENT: [place]/AT,  anicteric sclera, PERRL, moist mucous membranes. Dentures in place. Neuro: Awake, oriented x 4. Responds to verbal stimuli. Following commands consistently. Moves all 4 extremities spontaneously.  CV: Mildly tachycardic, regular rhythm, no m/g/r. PULM: Breathing even and unlabored on 2L [place]. Lung fields CTAB in upper fields, diminished at bilateral bases, improved from prior exam. GI: Soft, nontender, nondistended. Normoactive bowel sounds. Extremities: Bilateral symmetric 1+ LE edema noted. Skin: Warm/dry, no rashes. Midline sternotomy incision c/d/I without erythema or drainage.  Resolved Hospital Problem List:    Assessment & Plan:   CAD, 3-vessel disease, s/p CABG Complicated by post-op coagulopathy and bleeding/ S/p perioperative Ancef course. - Primary management by TCTS - Off of pressors, milrinone - Gentle diuresis starting today with close monitoring of BP/renal function - Trend BMP - Replete electrolytes as indicated - Monitor I&Os in the setting of diuresis - CT removal per TCTS - Pain control per protocol, oxycodone/tramadol/morphine - Adjunct medications, lidocaine patches/Robaxi  Acute blood loss anemia - Trend H&H - Transfuse for Hgb < 7.0 or hemodynamically significant bleeding - Monitor CT output  Post-op vent management Extubated 1/24. - Continue supplemental O2 support - Wean O2 for sat > 90% - Pulmonary hygiene, IS/flutter - Adequate pain control to encourage pulmonary toileting  Dysphagia Present PTA, persistent post-extubation. - SLP evaluation with clearance for DYS2 (limited 2/2 no dentures) - Consider Nutrition evaluation  Hyperglycemia - CBGs Q4H - Goal 140-180 - SSI  Anemia, coagulopathy, thrombocytopenia due to intra-op anticoagulation, expected operative blood loss - Trend CBC, Coags  Remote history of tobacco abuse, no evidence of COPD on  exam - Outside recommended age for routine lung CA screening, no referral needed  Best Practice:  (right click and "Reselect all SmartList Selections" daily)   Per Primary   Critical care time: N/A   Faythe GheeStephanie M Takia Runyon, PA-C [facility] 12/12/21 7:43 AM  Please see [email or web address] for pager details.  From 7A-7P if no response, please call [phone] After hours, please call ELink [phone]
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
## 2021-12-12 NOTE — Plan of Care (Signed)
°  Problem: Education: Goal: Knowledge of General Education information will improve Description: Including pain rating scale, medication(s)/side effects and non-pharmacologic comfort measures Outcome: Progressing   Problem: Health Behavior/Discharge Planning: Goal: Ability to manage health-related needs will improve Outcome: Progressing   Problem: Clinical Measurements: Goal: Ability to maintain clinical measurements within normal limits will improve Outcome: Progressing Goal: Will remain free from infection Outcome: Progressing Goal: Diagnostic test results will improve Outcome: Progressing Goal: Respiratory complications will improve Outcome: Progressing Goal: Cardiovascular complication will be avoided Outcome: Progressing   Problem: Activity: Goal: Risk for activity intolerance will decrease Outcome: Progressing   Problem: Nutrition: Goal: Adequate nutrition will be maintained Outcome: Progressing   Problem: Coping: Goal: Level of anxiety will decrease Outcome: Progressing   Problem: Elimination: Goal: Will not experience complications related to bowel motility Outcome: Progressing Goal: Will not experience complications related to urinary retention Outcome: Progressing   Problem: Pain Managment: Goal: General experience of comfort will improve Outcome: Progressing   Problem: Safety: Goal: Ability to remain free from injury will improve Outcome: Progressing   Problem: Skin Integrity: Goal: Risk for impaired skin integrity will decrease Outcome: Progressing   Problem: Education: Goal: Understanding of CV disease, CV risk reduction, and recovery process will improve Outcome: Progressing Goal: Individualized Educational Video(s) Outcome: Progressing   Problem: Activity: Goal: Ability to return to baseline activity level will improve Outcome: Progressing   Problem: Cardiovascular: Goal: Ability to achieve and maintain adequate cardiovascular perfusion  will improve Outcome: Progressing Goal: Vascular access site(s) Level 0-1 will be maintained Outcome: Progressing   Problem: Health Behavior/Discharge Planning: Goal: Ability to safely manage health-related needs after discharge will improve Outcome: Progressing   Problem: Education: Goal: Will demonstrate proper wound care and an understanding of methods to prevent future damage Outcome: Progressing Goal: Knowledge of disease or condition will improve Outcome: Progressing Goal: Knowledge of the prescribed therapeutic regimen will improve Outcome: Progressing Goal: Individualized Educational Video(s) Outcome: Progressing   Problem: Activity: Goal: Risk for activity intolerance will decrease Outcome: Progressing   Problem: Cardiac: Goal: Will achieve and/or maintain hemodynamic stability Outcome: Progressing   Problem: Clinical Measurements: Goal: Postoperative complications will be avoided or minimized Outcome: Progressing   Problem: Respiratory: Goal: Respiratory status will improve Outcome: Progressing   Problem: Skin Integrity: Goal: Wound healing without signs and symptoms of infection Outcome: Progressing Goal: Risk for impaired skin integrity will decrease Outcome: Progressing   Problem: Urinary Elimination: Goal: Ability to achieve and maintain adequate renal perfusion and functioning will improve Outcome: Progressing   Problem: Safety: Goal: Non-violent Restraint(s) Outcome: Progressing

## 2021-12-12 NOTE — Progress Notes (Signed)
Inpatient Rehab Admissions Coordinator:   Per therapy recommendations pt was screened for CIR candidacy by Estill Dooms, PT, DPT.  Pt appears to be a potential candidate for our program and I will place an order for full assessment, per our protocol.  Note that prior authorization is required for CIR and [facility] Medicare may not approve.   Estill Dooms, PT, DPT Admissions Coordinator [phone] 12/12/21  8:48 AM

## 2021-12-12 NOTE — Progress Notes (Signed)
° °   °  [address]       [place], [address]             [phone]                 4 Days Post-Op Procedure(s) (LRB): CORONARY ARTERY BYPASS GRAFTING (CABG) X 3, ON PUMP< USING LEFT INTERNAL MAMMARY ARTERY AND RIGHT ENDOSCOPIC GREATER SAPHENOUS VEIN CONDUITS (N/A) TRANSESOPHAGEAL ECHOCARDIOGRAM (TEE) (N/A) APPLICATION OF CELL SAVER ENDOVEIN HARVEST OF GREATER SAPHENOUS VEIN (Right)   Events: No events ____________________________________________ Vitals: BP (!) 102/54 (BP Location: Right Arm)    Pulse 88    Temp 99.8 F (37.7 C) (Oral)    Resp 11    Ht 5\' 2"  (1.575 m)    Wt 74.7 kg    SpO2 100%    BMI 30.12 kg/m  Filed Weights   12/10/21 0500 12/11/21 0500 12/12/21 0500  Weight: 77.4 kg 71.4 kg 74.7 kg     - Neuro: alert NAD  - Cardiovascular: sinus  Drips:  CVP:  [6 mmHg-15 mmHg] 14 mmHg  - Pulm:  EWOB SS CT output  ABG    Component Value Date/Time   PHART 7.375 12/09/2021 1906   PCO2ART 36.3 12/09/2021 1906   PO2ART 182 (H) 12/09/2021 1906   HCO3 21.3 12/09/2021 1906   TCO2 22 12/09/2021 1906   ACIDBASEDEF 4.0 (H) 12/09/2021 1906   O2SAT 75.5 12/12/2021 0339    - Abd: ND - Extremity: warm  .Intake/Output      01/26 0701 01/27 0700 01/27 0701 01/28 0700   P.O. 110    I.V. (mL/kg) 196.1 (2.6)    IV Piggyback     Total Intake(mL/kg) 306.1 (4.1)    Urine (mL/kg/hr) 370 (0.2)    Emesis/NG output     Chest Tube 230    Total Output 600    Net -293.9            _______________________________________________________________ Labs: CBC Latest Ref Rng & Units 12/12/2021 12/11/2021 12/10/2021  WBC 4.0 - 10.5 K/uL 6.6 5.6 4.5  Hemoglobin 12.0 - 15.0 g/dL 7.7(L) 8.0(L) 9.3(L)  Hematocrit 36.0 - 46.0 % 23.2(L) 23.6(L) 26.6(L)  Platelets 150 - 400 K/uL 147(L) 111(L) 113(L)   CMP Latest Ref Rng & Units 12/12/2021 12/11/2021 12/10/2021  Glucose 70 - 99 mg/dL 135(H) 166(H) 190(H)  BUN 8 - 23 mg/dL 13 11 6(L)  Creatinine 0.44 - 1.00 mg/dL 0.84  0.78 0.84  Sodium 135 - 145 mmol/L 136 135 131(L)  Potassium 3.5 - 5.1 mmol/L 3.9 4.0 3.8  Chloride 98 - 111 mmol/L 107 107 105  CO2 22 - 32 mmol/L 25 25 20(L)  Calcium 8.9 - 10.3 mg/dL 7.8(L) 7.5(L) 7.2(L)  Total Protein 6.5 - 8.1 g/dL - - -  Total Bilirubin 0.3 - 1.2 mg/dL - - -  Alkaline Phos 38 - 126 U/L - - -  AST 15 - 41 U/L - - -  ALT 0 - 44 U/L - - -    CXR: clear  _______________________________________________________________  Assessment and Plan: POD 4 s/p CABG  Neuro: pain controlled CV: d/c milr.   On Asp.  Allergic to statin.  Holding BB for hypotension.   Pulm: We will remove chest tubes Renal: creat stable.  Gentle diuresis  GI: will advance diet Heme: H/H stable ID: [email or web address] Endo: SSI Dispo: Floor soon.   Lajuana Matte 12/12/2021 10:11 AM

## 2021-12-12 NOTE — Plan of Care (Signed)

## 2021-12-13 ENCOUNTER — Inpatient Hospital Stay (HOSPITAL_COMMUNITY): Payer: Medicare PPO

## 2021-12-13 DIAGNOSIS — Z951 Presence of aortocoronary bypass graft: Secondary | ICD-10-CM | POA: Diagnosis not present

## 2021-12-13 DIAGNOSIS — I2510 Atherosclerotic heart disease of native coronary artery without angina pectoris: Secondary | ICD-10-CM | POA: Diagnosis not present

## 2021-12-13 DIAGNOSIS — E119 Type 2 diabetes mellitus without complications: Secondary | ICD-10-CM | POA: Diagnosis not present

## 2021-12-13 DIAGNOSIS — Z794 Long term (current) use of insulin: Secondary | ICD-10-CM | POA: Diagnosis not present

## 2021-12-13 LAB — CBC
HCT: 24.4 % — ABNORMAL LOW (ref 36.0–46.0)
Hemoglobin: 8 g/dL — ABNORMAL LOW (ref 12.0–15.0)
MCH: 30 pg (ref 26.0–34.0)
MCHC: 32.8 g/dL (ref 30.0–36.0)
MCV: 91.4 fL (ref 80.0–100.0)
Platelets: 175 10*3/uL (ref 150–400)
RBC: 2.67 MIL/uL — ABNORMAL LOW (ref 3.87–5.11)
RDW: 16 % — ABNORMAL HIGH (ref 11.5–15.5)
WBC: 6.9 10*3/uL (ref 4.0–10.5)
nRBC: 0.3 % — ABNORMAL HIGH (ref 0.0–0.2)

## 2021-12-13 LAB — BASIC METABOLIC PANEL
Anion gap: 7 (ref 5–15)
BUN: 11 mg/dL (ref 8–23)
CO2: 25 mmol/L (ref 22–32)
Calcium: 8.2 mg/dL — ABNORMAL LOW (ref 8.9–10.3)
Chloride: 106 mmol/L (ref 98–111)
Creatinine, Ser: 0.83 mg/dL (ref 0.44–1.00)
GFR, Estimated: >60 mL/min (ref >60)
Glucose, Bld: 98 mg/dL (ref 70–99)
Potassium: 3.7 mmol/L (ref 3.5–5.1)
Sodium: 138 mmol/L (ref 135–145)

## 2021-12-13 LAB — GLUCOSE, CAPILLARY
Glucose-Capillary: 68 mg/dL — ABNORMAL LOW (ref 70–99)
Glucose-Capillary: 87 mg/dL (ref 70–99)
Glucose-Capillary: 138 mg/dL — ABNORMAL HIGH (ref 70–99)
Glucose-Capillary: 135 mg/dL — ABNORMAL HIGH (ref 70–99)
Glucose-Capillary: 81 mg/dL (ref 70–99)
Glucose-Capillary: 143 mg/dL — ABNORMAL HIGH (ref 70–99)

## 2021-12-13 LAB — COOXEMETRY PANEL
Carboxyhemoglobin: 1.7 % — ABNORMAL HIGH (ref 0.5–1.5)
Methemoglobin: 1.2 % (ref 0.0–1.5)
O2 Saturation: 28.4 %
Total hemoglobin: 8.2 g/dL — ABNORMAL LOW (ref 12.0–16.0)

## 2021-12-13 MED ORDER — MAGNESIUM HYDROXIDE 400 MG/5ML PO SUSP
15.0000 mL | Freq: Once | ORAL | Status: AC
  Administered 2021-12-13: 15 mL via ORAL
  Filled 2021-12-13: qty 30

## 2021-12-13 MED ORDER — DOCUSATE SODIUM 100 MG PO CAPS
200.0000 mg | ORAL_CAPSULE | Freq: Every day | ORAL | Status: DC
  Administered 2021-12-14: 200 mg via ORAL
  Filled 2021-12-13 (×3): qty 2

## 2021-12-13 NOTE — Progress Notes (Signed)
° °  NAME:  [name] [name], MRN:  [id number], DOB:  Jan 11, 1941, LOS: 50 ADMISSION DATE:  12/02/2021, CONSULTATION DATE:  12/09/2021 REFERRING MD:  Kipp Brood - TCTS CHIEF COMPLAINT:  Post-CABG   History of Present Illness:  81 year old woman with a history of CAD who presented with anginal symptoms of chest heaviness, pain between her shoulders, overall uneasiness, edema of lower extremities, shortness of breath who presented for elective heart catheterization.  She was found to have three-vessel coronary artery disease.  LVEF 55 to 60%.  She underwent elective CABG on 1/23.  Pertinent Medical History:  RA, T2DM, CAD, HLD with statin intolerance, remote history of tobacco abuse  Significant Hospital Events: Including procedures, antibiotic start and stop dates in addition to other pertinent events   1/23 CABG, did not tolerate protamine reversal 1/24 blood output from chest tubes, getting several transfusions 1/26 Off of pressors. Midodrine off today. Up to chair, improved pain. SLP evaluation, cleared for DYS2. 1/27 Remains off of pressors. CT removal and gentle diuresis per TCTS.  Interim History / Subjective:  No overnight issues Awaiting progressive care bed placement  Objective:  Blood pressure (!) 99/56, pulse 70, temperature 98.3 F (36.8 C), temperature source Oral, resp. rate 17, height 5\' 2"  (1.575 m), weight 72.3 kg, SpO2 96 %.      Intake/Output Summary (Last 24 hours) at 12/13/2021 1402 Last data filed at 12/13/2021 0400 Gross per 24 hour  Intake 68.86 ml  Output 500 ml  Net -431.14 ml   Filed Weights   12/11/21 0500 12/12/21 0500 12/13/21 0600  Weight: 71.4 kg 74.7 kg 72.3 kg   Physical Examination: General: Acute on Chronically ill-appearing elderly woman sitting in the chair HEENT: /AT, anicteric sclera, PERRL, moist mucous membranes. Dentures in place. Neuro: Awake, oriented x 4. Responds to verbal stimuli. Following commands consistently. Moves all 4 extremities  spontaneously.  CV: Mildly tachycardic, regular rhythm, no m/g/r. PULM: Breathing even and unlabored on 2L . Lung fields CTAB in upper fields, diminished at bilateral bases, improved from prior exam. GI: Soft, nontender, nondistended. Normoactive bowel sounds. Extremities: Bilateral symmetric 1+ LE edema noted. Skin: Warm/dry, no rashes. Midline sternotomy incision c/d/I without erythema or drainage.  Resolved Hospital Problem List:  Postop vent management  Assessment & Plan:   CAD, 3-vessel disease, s/p CABG Complicated by post-op coagulopathy and bleeding Continue antiplatelets and atorvastatin Chest tubes were removed per TCTS Pain control per protocol, oxycodone/tramadol/morphine  Acute blood loss anemia due to postop bleeding H&H remained stable Closely monitor  Anemia, coagulopathy, thrombocytopenia due to intra-op anticoagulation, expected operative blood loss CBC and coags are stable  Best Practice: (right click and "Reselect all SmartList Selections" daily)   Per Primary   Critical care time: N/A     Jacky Kindle MD [facility] See Amion for pager If no response to pager, please call [phone] until 7pm After 7pm, Please call E-link [phone]

## 2021-12-13 NOTE — Evaluation (Signed)
Occupational Therapy Evaluation Patient Details Name: Angelena Formheresa Y Anzaldo MRN: [id number] DOB: May 05, 1941 Today's Date: 12/13/2021   History of Present Illness Pt is an 81 y.o. female who presented 12/02/21 for elective heart cath secondary to anginal symptoms of chest heaviness, pain between her shoulders, SOB, lower extremity edema. Found to have 3-vessel CAD, LVEF 55-60%. S/p CABG x3 1/23. PMH: CAD, DM, RA   Clinical Impression   Pt was functioning independently in ADLs, mobility, light housekeeping and light meal prep prior to admission. Her family assisted with heavy work. Pt presents with generalized weakness, decreased activity tolerance and impaired standing balance. She requires up to mod assist for mobility and set up to mod assist for ADL. Recommending intensive rehab prior to return home with her supportive family.      Recommendations for follow up therapy are one component of a multi-disciplinary discharge planning process, led by the attending physician.  Recommendations may be updated based on patient status, additional functional criteria and insurance authorization.   Follow Up Recommendations  Acute inpatient rehab (3hours/day)    Assistance Recommended at Discharge Set up Supervision/Assistance  Patient can return home with the following Assistance with cooking/housework;Assist for transportation;Help with stairs or ramp for entrance;A lot of help with bathing/dressing/bathroom;A lot of help with walking and/or transfers    Functional Status Assessment  Patient has had a recent decline in their functional status and demonstrates the ability to make significant improvements in function in a reasonable and predictable amount of time.  Equipment Recommendations   (shower seat vs bench)    Recommendations for Other Services       Precautions / Restrictions Precautions Precautions: Fall;Sternal Precaution Comments: reviewed sternal precautions Restrictions Weight Bearing  Restrictions: No      Mobility Bed Mobility               General bed mobility comments: pt in chair    Transfers Overall transfer level: Needs assistance Equipment used: Rolling walker (2 wheels) Transfers: Sit to/from Stand Sit to Stand: Mod assist           General transfer comment: stood from chair with cues for technique, mod assist to rise with momentum      Balance Overall balance assessment: Needs assistance   Sitting balance-Leahy Scale: Fair     Standing balance support: Bilateral upper extremity supported, During functional activity, No upper extremity supported Standing balance-Leahy Scale: Fair Standing balance comment: Able to stand statically briefly without UE support but benefits from bil UE support to ambulate                           ADL either performed or assessed with clinical judgement   ADL Overall ADL's : Needs assistance/impaired Eating/Feeding: Independent;Sitting   Grooming: Wash/dry hands;Sitting;Set up   Upper Body Bathing: Minimal assistance;Sitting   Lower Body Bathing: Sit to/from stand;Moderate assistance   Upper Body Dressing : Minimal assistance;Sitting   Lower Body Dressing: Sit to/from stand;Moderate assistance   Toilet Transfer: Minimal assistance;Stand-pivot;BSC/3in1;Rolling walker (2 wheels)   Toileting- Clothing Manipulation and Hygiene: Moderate assistance;Sit to/from stand       Functional mobility during ADLs: Minimal assistance;Rolling walker (2 wheels) General ADL Comments: Educated pt and family in IADL she will need assist with upon discharge.     Vision Baseline Vision/History: 1 Wears glasses Ability to See in Adequate Light: 0 Adequate Patient Visual Report: No change from baseline       Perception  Praxis      Pertinent Vitals/Pain Pain Assessment Pain Assessment: Faces Faces Pain Scale: Hurts a little bit Pain Location: chest incision Pain Descriptors / Indicators:  Discomfort, Sore Pain Intervention(s): Monitored during session     Hand Dominance Right   Extremity/Trunk Assessment Upper Extremity Assessment Upper Extremity Assessment: Overall WFL for tasks assessed   Lower Extremity Assessment Lower Extremity Assessment: Defer to PT evaluation   Cervical / Trunk Assessment Cervical / Trunk Assessment: Kyphotic   Communication Communication Communication: No difficulties   Cognition Arousal/Alertness: Awake/alert Behavior During Therapy: WFL for tasks assessed/performed Overall Cognitive Status: Within Functional Limits for tasks assessed                                       General Comments       Exercises     Shoulder Instructions      Home Living Family/patient expects to be discharged to:: Private residence Living Arrangements: Alone Available Help at Discharge: Family;Available 24 hours/day Type of Home: House Home Access: Stairs to enter [employer] of Steps: 7 Entrance Stairs-Rails: Left Home Layout: One level;Laundry or work area in basement     [employer]: Producer, television/film/video: Handicapped height     Home Equipment: BSC/3in1;Grab bars - tub/shower          Prior Functioning/Environment Prior Level of Function : Independent/Modified Independent;Driving             Mobility Comments: Does not use an AD.          OT Problem List: Decreased strength;Decreased activity tolerance;Impaired balance (sitting and/or standing);Decreased knowledge of use of DME or AE;Decreased knowledge of precautions;Pain      OT Treatment/Interventions: Self-care/ADL training;DME and/or AE instruction;Energy conservation;Therapeutic activities;Patient/family education;Balance training    OT Goals(Current goals can be found in the care plan section) Acute Rehab OT Goals OT Goal Formulation: With patient Time For Goal Achievement: 12/26/21 Potential to Achieve Goals:  Good ADL Goals Pt Will Perform Grooming: with supervision;standing Pt Will Perform Upper Body Dressing: with set-up;sitting Pt Will Perform Lower Body Dressing: with supervision;sit to/from stand Pt Will Transfer to Toilet: with supervision;ambulating;bedside commode Pt Will Perform Toileting - Clothing Manipulation and hygiene: with supervision;sit to/from stand Additional ADL Goal #1: Pt will adhere to sternal precautions during ADLs and mobility. Additional ADL Goal #2: Pt will perform bed mobility modified independently in preparation for ADL.  OT Frequency: Min 2X/week    Co-evaluation              AM-PAC OT "6 Clicks" Daily Activity     Outcome Measure Help from another person eating meals?: None Help from another person taking care of personal grooming?: A Little Help from another person toileting, which includes using toliet, bedpan, or urinal?: A Lot Help from another person bathing (including washing, rinsing, drying)?: A Lot Help from another person to put on and taking off regular upper body clothing?: A Little Help from another person to put on and taking off regular lower body clothing?: A Lot 6 Click Score: 16   End of Session Equipment Utilized During Treatment: Rolling walker (2 wheels)  Activity Tolerance: Patient tolerated treatment well Patient left: in chair;with call bell/phone within reach;with family/visitor present  OT Visit Diagnosis: Unsteadiness on feet (R26.81);Other abnormalities of gait and mobility (R26.89);Pain;Muscle weakness (generalized) (M62.81)  Time: 1610-9604 OT Time Calculation (min): 14 min Charges:  OT General Charges $OT Visit: 1 Visit OT Evaluation $OT Eval Moderate Complexity: 1 Mod  Martie Round, OTR/L Acute Rehabilitation Services Pager: [phone] Office: [phone]   Evern Bio 12/13/2021, 1:36 PM

## 2021-12-13 NOTE — Progress Notes (Signed)
CARDIAC REHAB PHASE I   PRE:  Rate/Rhythm: 86 SR    BP: sitting 113/86    SaO2: 96 RA  MODE:  Ambulation: 20 ft   POST:  Rate/Rhythm:     BP: sitting      SaO2:   1315-1340 In to assist patient with ambulation. Patient sitting in recliner. Son at bedside. Patient assisted to standing position utilizing gait belt, walker, and primary RN. Patient c/o mild dizziness and leg weakness. Needing to have BM. Slowly assisted to toilet. VSS. Dizziness resolved during ambulation to toilet. Patient encouraged to have family bring bra decrease incisional pain. Primary RN will assist out of bathroom when patient is ready. Encouraged patient to request ambulation later today when she feels more rested. Already walked x 2 today.    Naja Apperson Hoover Brunette RN, BSN

## 2021-12-13 NOTE — Progress Notes (Signed)
Inpatient Rehab Admissions:  Inpatient Rehab Consult received.  I met with patient and her son, Dexter at the bedside for rehabilitation assessment and to discuss goals and expectations of an inpatient rehab admission.  Both acknowledged understanding of CIR goals and expectations. Pt/family would like to think about therapy options before making a decision regarding pursuing CIR. Pt gave permission to contact brother, Sonia Side. Spoke with Sonia Side on the telephone. He confirmed that he would be able to provide 24/7 assistance/supervision for pt after discharge.  Will continue to follow.  Signed: Gayland Curry, Grandfather, [place] Admissions Coordinator [phone]

## 2021-12-13 NOTE — Progress Notes (Signed)
5 Days Post-Op Procedure(s) (LRB): CORONARY ARTERY BYPASS GRAFTING (CABG) X 3, ON PUMP< USING LEFT INTERNAL MAMMARY ARTERY AND RIGHT ENDOSCOPIC GREATER SAPHENOUS VEIN CONDUITS (N/A) TRANSESOPHAGEAL ECHOCARDIOGRAM (TEE) (N/A) APPLICATION OF CELL SAVER ENDOVEIN HARVEST OF GREATER SAPHENOUS VEIN (Right) Subjective: Up in chair C/o constipation  Objective: Vital signs in last 24 hours: Temp:  [98.1 F (36.7 C)-99.4 F (37.4 C)] 98.1 F (36.7 C) (01/28 0759) Pulse Rate:  [76-102] 79 (01/28 0800) Cardiac Rhythm: Normal sinus rhythm (01/28 0800) Resp:  [13-22] 15 (01/28 0800) BP: (90-140)/(46-67) 126/52 (01/28 0800) SpO2:  [95 %-100 %] 100 % (01/28 0800) Weight:  [72.3 kg] 72.3 kg (01/28 0600)  Hemodynamic parameters for last 24 hours:  nsr  Intake/Output from previous day: 01/27 0701 - 01/28 0700 In: 268.9 [P.O.:200; I.V.:68.9] Out: 1180 [Urine:1180] Intake/Output this shift: No intake/output data recorded.       Exam    General- alert and comfortable    Neck- no JVD, no cervical adenopathy palpable, no carotid bruit   Lungs- clear without rales, wheezes   Cor- regular rate and rhythm, no murmur , gallop   Abdomen- soft, non-tender   Extremities - warm, non-tender, minimal edema   Neuro- oriented, appropriate, no focal weakness   Lab Results: Recent Labs    12/12/21 0816 12/13/21 0311  WBC 6.6 6.9  HGB 7.7* 8.0*  HCT 23.2* 24.4*  PLT 147* 175   BMET:  Recent Labs    12/12/21 0339 12/13/21 0311  NA 136 138  K 3.9 3.7  CL 107 106  CO2 25 25  GLUCOSE 135* 98  BUN 13 11  CREATININE 0.84 0.83  CALCIUM 7.8* 8.2*    PT/INR: No results for input(s): LABPROT, INR in the last 72 hours. ABG    Component Value Date/Time   PHART 7.375 12/09/2021 1906   HCO3 21.3 12/09/2021 1906   TCO2 22 12/09/2021 1906   ACIDBASEDEF 4.0 (H) 12/09/2021 1906   O2SAT 28.4 12/13/2021 0311   CBG (last 3)  Recent Labs    12/13/21 0348 12/13/21 0405 12/13/21 0801  GLUCAP 68*  87 138*    Assessment/Plan: S/P Procedure(s) (LRB): CORONARY ARTERY BYPASS GRAFTING (CABG) X 3, ON PUMP< USING LEFT INTERNAL MAMMARY ARTERY AND RIGHT ENDOSCOPIC GREATER SAPHENOUS VEIN CONDUITS (N/A) TRANSESOPHAGEAL ECHOCARDIOGRAM (TEE) (N/A) APPLICATION OF CELL SAVER ENDOVEIN HARVEST OF GREATER SAPHENOUS VEIN (Right) Tx to stepdown Cxr with L basilar atelectasis- ambulate bid   LOS: 11 days    Lovett Sox 12/13/2021

## 2021-12-14 LAB — CBC
HCT: 24.9 % — ABNORMAL LOW (ref 36.0–46.0)
Hemoglobin: 8 g/dL — ABNORMAL LOW (ref 12.0–15.0)
MCH: 30.1 pg (ref 26.0–34.0)
MCHC: 32.1 g/dL (ref 30.0–36.0)
MCV: 93.6 fL (ref 80.0–100.0)
Platelets: 208 10*3/uL (ref 150–400)
RBC: 2.66 MIL/uL — ABNORMAL LOW (ref 3.87–5.11)
RDW: 16.2 % — ABNORMAL HIGH (ref 11.5–15.5)
WBC: 8.6 10*3/uL (ref 4.0–10.5)
nRBC: 0.8 % — ABNORMAL HIGH (ref 0.0–0.2)

## 2021-12-14 LAB — BASIC METABOLIC PANEL
Anion gap: 8 (ref 5–15)
BUN: 9 mg/dL (ref 8–23)
CO2: 24 mmol/L (ref 22–32)
Calcium: 8 mg/dL — ABNORMAL LOW (ref 8.9–10.3)
Chloride: 105 mmol/L (ref 98–111)
Creatinine, Ser: 0.79 mg/dL (ref 0.44–1.00)
GFR, Estimated: >60 mL/min (ref >60)
Glucose, Bld: 132 mg/dL — ABNORMAL HIGH (ref 70–99)
Potassium: 4.3 mmol/L (ref 3.5–5.1)
Sodium: 137 mmol/L (ref 135–145)

## 2021-12-14 LAB — GLUCOSE, CAPILLARY
Glucose-Capillary: 143 mg/dL — ABNORMAL HIGH (ref 70–99)
Glucose-Capillary: 99 mg/dL (ref 70–99)

## 2021-12-14 MED ORDER — INSULIN ASPART 100 UNIT/ML IJ SOLN
0.0000 [IU] | Freq: Three times a day (TID) | INTRAMUSCULAR | Status: DC
  Administered 2021-12-14 – 2021-12-15 (×2): 2 [IU] via SUBCUTANEOUS

## 2021-12-14 MED ORDER — NYSTATIN 100000 UNIT/ML MT SUSP
5.0000 mL | Freq: Four times a day (QID) | OROMUCOSAL | Status: DC
  Administered 2021-12-14 – 2021-12-16 (×9): 500000 [IU] via ORAL
  Filled 2021-12-14 (×9): qty 5

## 2021-12-14 MED ORDER — ACETAMINOPHEN 325 MG PO TABS
650.0000 mg | ORAL_TABLET | Freq: Four times a day (QID) | ORAL | Status: DC | PRN
  Administered 2021-12-14 (×2): 650 mg via ORAL
  Filled 2021-12-14 (×2): qty 2

## 2021-12-14 NOTE — Progress Notes (Addendum)
° °   °  [address]       [place] [address]             [phone]      6 Days Post-Op Procedure(s) (LRB): CORONARY ARTERY BYPASS GRAFTING (CABG) X 3, ON PUMP< USING LEFT INTERNAL MAMMARY ARTERY AND RIGHT ENDOSCOPIC GREATER SAPHENOUS VEIN CONDUITS (N/A) TRANSESOPHAGEAL ECHOCARDIOGRAM (TEE) (N/A) APPLICATION OF CELL SAVER ENDOVEIN HARVEST OF GREATER SAPHENOUS VEIN (Right)  Subjective:  Paitent up in chair.  States her pain is starting to come back, asking if she has pain medication due.  She also complains of sore mouth.  States anything she eats or drinks causes discomfort and burning.  + BM  Objective: Vital signs in last 24 hours: Temp:  [98 F (36.7 C)-99.1 F (37.3 C)] 98.3 F (36.8 C) (01/29 0735) Pulse Rate:  [69-100] 73 (01/29 0735) Cardiac Rhythm: Normal sinus rhythm (01/28 1904) Resp:  [16-24] 20 (01/29 0735) BP: (74-140)/(47-105) 123/51 (01/29 0735) SpO2:  [95 %-100 %] 100 % (01/29 0735)  Intake/Output from previous day: 01/28 0701 - 01/29 0700 In: 0  Out: 450 [Urine:450]  General appearance: alert, cooperative, and no distress Heart: regular rate and rhythm Lungs: clear to auscultation bilaterally Abdomen: soft, non-tender; bowel sounds normal; no masses,  no organomegaly Extremities: edema trace Wound: clean and dry Mouth_ white plaques, consistent with thrush  Lab Results: Recent Labs    12/13/21 0311 12/14/21 0419  WBC 6.9 8.6  HGB 8.0* 8.0*  HCT 24.4* 24.9*  PLT 175 208   BMET:  Recent Labs    12/13/21 0311 12/14/21 0419  NA 138 137  K 3.7 4.3  CL 106 105  CO2 25 24  GLUCOSE 98 132*  BUN 11 9  CREATININE 0.83 0.79  CALCIUM 8.2* 8.0*    PT/INR: No results for input(s): LABPROT, INR in the last 72 hours. ABG    Component Value Date/Time   PHART 7.375 12/09/2021 1906   HCO3 21.3 12/09/2021 1906   TCO2 22 12/09/2021 1906   ACIDBASEDEF 4.0 (H) 12/09/2021 1906   O2SAT 28.4 12/13/2021 0311   CBG (last 3)  Recent  Labs    12/13/21 1145 12/13/21 1539 12/13/21 1834  GLUCAP 135* 81 143*    Assessment/Plan: S/P Procedure(s) (LRB): CORONARY ARTERY BYPASS GRAFTING (CABG) X 3, ON PUMP< USING LEFT INTERNAL MAMMARY ARTERY AND RIGHT ENDOSCOPIC GREATER SAPHENOUS VEIN CONDUITS (N/A) TRANSESOPHAGEAL ECHOCARDIOGRAM (TEE) (N/A) APPLICATION OF CELL SAVER ENDOVEIN HARVEST OF GREATER SAPHENOUS VEIN (Right)  CV- NSR with PVCs. BP is labile running 70-140s- will increase Lopressor to 25 mg BID Pulm- off oxygen, no acute issues, continue IS Renal-creatinine is normal at 0.79, K is at 4.3, remains volume overloaded on exam continue Lasix GI- constipation resolved, continue prn stool softner Oral Thrush- start Nystatin swish and swallow. Pain control- stop IV Morphine, continue Oxycodone, will add Tylenol prn DM- sugars are elevated at times, however oral intake is poor at times due to mouth discomfort continue SSIP  Deconditioning- patient is frail, recommended CIR at discharge, however patient prefers to go home with brother, h/h orders have been placed   LOS: 12 days    Lowella Dandy, PA-C 12/14/2021  Patient looks very good overall and should be ready for discharge within 48 hours.  She is ambulating, on room air, and tolerating her diet.  Lovett Sox MD

## 2021-12-14 NOTE — Progress Notes (Signed)
Mobility Specialist Progress Note:   12/14/21 1143  Mobility  Activity Ambulated with assistance in hallway  Level of Assistance Moderate assist, patient does 50-74%  Assistive Device Front wheel walker  Distance Ambulated (ft) 120 ft  Activity Response Tolerated well  $Mobility charge 1 Mobility   Pt received in chair willing to participate in mobility. No complaints of pain. Mod A to stand from chair and required a refresher on sternal precautions. Left at sink to get washed up with NT present.   [facility] LLCCarson Designer, jewelleryDay Mobility Specialist Primary Phone [phone] Secondary Phone [phone]

## 2021-12-15 LAB — GLUCOSE, CAPILLARY
Glucose-Capillary: 93 mg/dL (ref 70–99)
Glucose-Capillary: 115 mg/dL — ABNORMAL HIGH (ref 70–99)
Glucose-Capillary: 123 mg/dL — ABNORMAL HIGH (ref 70–99)

## 2021-12-15 LAB — BASIC METABOLIC PANEL
Anion gap: 8 (ref 5–15)
BUN: 10 mg/dL (ref 8–23)
CO2: 24 mmol/L (ref 22–32)
Calcium: 8.1 mg/dL — ABNORMAL LOW (ref 8.9–10.3)
Chloride: 105 mmol/L (ref 98–111)
Creatinine, Ser: 0.8 mg/dL (ref 0.44–1.00)
GFR, Estimated: >60 mL/min (ref >60)
Glucose, Bld: 105 mg/dL — ABNORMAL HIGH (ref 70–99)
Potassium: 4.6 mmol/L (ref 3.5–5.1)
Sodium: 137 mmol/L (ref 135–145)

## 2021-12-15 LAB — CBC
HCT: 26.5 % — ABNORMAL LOW (ref 36.0–46.0)
Hemoglobin: 8.6 g/dL — ABNORMAL LOW (ref 12.0–15.0)
MCH: 30.5 pg (ref 26.0–34.0)
MCHC: 32.5 g/dL (ref 30.0–36.0)
MCV: 94 fL (ref 80.0–100.0)
Platelets: 259 10*3/uL (ref 150–400)
RBC: 2.82 MIL/uL — ABNORMAL LOW (ref 3.87–5.11)
RDW: 16.4 % — ABNORMAL HIGH (ref 11.5–15.5)
WBC: 10.2 10*3/uL (ref 4.0–10.5)
nRBC: 1.8 % — ABNORMAL HIGH (ref 0.0–0.2)

## 2021-12-15 MED ORDER — LOSARTAN POTASSIUM 25 MG PO TABS
25.0000 mg | ORAL_TABLET | Freq: Every day | ORAL | Status: DC
  Administered 2021-12-15 – 2021-12-16 (×2): 25 mg via ORAL
  Filled 2021-12-15 (×2): qty 1

## 2021-12-15 NOTE — Progress Notes (Signed)
Physical Therapy Treatment Patient Details Name: [name] [name] MRN: [id number] DOB: May 05, 1941 Today's Date: 12/15/2021   History of Present Illness Pt is an 81 y.o. female who presented 12/02/21 for elective heart cath secondary to anginal symptoms of chest heaviness, pain between her shoulders, SOB, lower extremity edema. Found to have 3-vessel CAD, LVEF 55-60%. S/p CABG x3 1/23. PMH: CAD, DM, RA    PT Comments    Patient progressing well towards PT goals. Session focused on LE strengthening and functional transfers. Worked on sit to stands from different surface heights requiring anywhere from Max A-Min A to stand with proper technique. Pt with hx of arthritis in bil knees making transitions uncomfortable/painful at times. Adhering well to sternal precautions during transfers. Encouraged pt to take home gait belt so brother/family can assist with mobility without pulling on UEs. Discharge recommendation updated to home with HHPT as pt denies CIR. Will follow.    Recommendations for follow up therapy are one component of a multi-disciplinary discharge planning process, led by the attending physician.  Recommendations may be updated based on patient status, additional functional criteria and insurance authorization.  Follow Up Recommendations  Home health PT     Assistance Recommended at Discharge Frequent or constant Supervision/Assistance  Patient can return home with the following A lot of help with bathing/dressing/bathroom;Assistance with cooking/housework;Assist for transportation;Help with stairs or ramp for entrance;A lot of help with walking and/or transfers   Equipment Recommendations  Rolling walker (2 wheels);Other (comment) (Shower stool)    Recommendations for Other Services       Precautions / Restrictions Precautions Precautions: Fall;Sternal Precaution Booklet Issued: Yes (comment) Precaution Comments: reviewed sternal precautions Restrictions Weight Bearing  Restrictions: Yes Other Position/Activity Restrictions: sternal precautions     Mobility  Bed Mobility               General bed mobility comments: Sitting inc hair upon PT arrival.    Transfers Overall transfer level: Needs assistance Equipment used: Rolling walker (2 wheels) Transfers: Sit to/from Stand Sit to Stand: Mod assist, Min assist, Max assist           General transfer comment: Heavy Mod A to power to standing with cues for anterior weight shift, use of momentum and forward movement; cues for foot placement and scooting forwards in chair. Hands on knees to adhere to precautions. Stood from [employer] chair x5, from EOB x4 with slightly elevated height. Needs cues for slow descent. Stood from toilet x1. Different assist needed from difference surface heights/fatigue level.    Ambulation/Gait Ambulation/Gait assistance: Min guard Gait Distance (Feet): 25 Feet (x2 bouts) Assistive device: Rolling walker (2 wheels) Gait Pattern/deviations: Step-through pattern, Decreased stride length Gait velocity: reduced     General Gait Details: SLow, steady gait with RW. 1 seated rest break in bathroom.   Stairs             Wheelchair Mobility    Modified Rankin (Stroke Patients Only)       Balance Overall balance assessment: Needs assistance Sitting-balance support: Feet supported, No upper extremity supported Sitting balance-Leahy Scale: Good     Standing balance support: During functional activity Standing balance-Leahy Scale: Fair Standing balance comment: Able to stand statically briefly without UE support but benefits from bil UE support to ambulate                            Cognition Arousal/Alertness: Awake/alert Behavior During Therapy: [facility] for  tasks assessed/performed Overall Cognitive Status: Within Functional Limits for tasks assessed                                          Exercises Other Exercises Other  Exercises: Sit to stand from low chair x5 with cues for foot placement, scooting forwards and forward momentum/weight shift and controlled descent Other Exercises: Sit to stand from slightly elevated surface from EOB x4    General Comments General comments (skin integrity, edema, etc.): VSS on RA.      Pertinent Vitals/Pain Pain Assessment Pain Assessment: Faces Faces Pain Scale: Hurts a little bit Pain Location: chest incision Pain Descriptors / Indicators: Sore Pain Intervention(s): Monitored during session    Home Living                          Prior Function            PT Goals (current goals can now be found in the care plan section) Progress towards PT goals: Progressing toward goals    Frequency    Min 3X/week      PT Plan Discharge plan needs to be updated    Co-evaluation              AM-PAC PT "6 Clicks" Mobility   Outcome Measure  Help needed turning from your back to your side while in a flat bed without using bedrails?: A Lot Help needed moving from lying on your back to sitting on the side of a flat bed without using bedrails?: A Lot Help needed moving to and from a bed to a chair (including a wheelchair)?: A Lot Help needed standing up from a chair using your arms (e.g., wheelchair or bedside chair)?: A Lot Help needed to walk in hospital room?: A Little Help needed climbing 3-5 steps with a railing? : A Lot 6 Click Score: 13    End of Session Equipment Utilized During Treatment: Gait belt Activity Tolerance: Patient tolerated treatment well Patient left: in chair;with call bell/phone within reach Nurse Communication: Mobility status PT Visit Diagnosis: Unsteadiness on feet (R26.81);Other abnormalities of gait and mobility (R26.89);Muscle weakness (generalized) (M62.81);Difficulty in walking, not elsewhere classified (R26.2);Pain Pain - part of body:  (chest)     Time: [id number] PT Time Calculation (min) (ACUTE ONLY): 38  min  Charges:  $Gait Training: 8-22 mins $Therapeutic Activity: 23-37 mins                     Marisa Severin, PT, DPT Acute Rehabilitation Services Pager [phone] Office [place] 12/15/2021, 12:42 PM

## 2021-12-15 NOTE — Progress Notes (Signed)
CARDIAC REHAB PHASE I   PRE:  Rate/Rhythm: 67 SR    BP: sitting 121/58    SaO2: 99 RA  MODE:  Ambulation: 340 ft   POST:  Rate/Rhythm: 108 ST with PVCs    BP: sitting 145/59     SaO2: 100 RA after 4 min  Pt in recliner where she has been staying. Pt with weak hips/core and struggled to move hips forward with shimmy technique. Needed rest afterward. Pt failed to stand with rocking and arm support. Stood on second attempt with rocking and gait belt, mod assist. Pt ambulated to BR with RW, steady. Cleaned herself then max assist with gait belt to stand from low toilet with heavy poster lean.  Ambulated hall with appropriate pace, increased distance. Steady, no support needed when walking. To recliner, lowered herself appropriately. Elevated feet for fluid. VSS PT later today, to work with her standing/powering up. [id number]  [name] [name] CES, ACSM 12/15/2021 9:51 AM

## 2021-12-15 NOTE — Progress Notes (Signed)
Mobility Specialist: Progress Note   12/15/21 1259  Mobility  Bed Position Chair  Activity Ambulated with assistance in hallway  Level of Assistance Modified independent, requires aide device or extra time  Assistive Device Front wheel walker  Distance Ambulated (ft) 400 ft  Activity Response Tolerated well  $Mobility charge 1 Mobility   Post-Mobility: 92 HR, 148/70 BP  Pt received from BR with NT present in the room. No c/o throughout ambulation. Pt to recliner after walk with call bell and phone at her side.   [facility] Yuriana [name] Mobility Specialist Mobility Specialist [address] [place]: [phone] Mobility Specialist [address] [place] [address] [place]: [phone]

## 2021-12-15 NOTE — Progress Notes (Signed)
Occupational Therapy Treatment Patient Details Name: [name] [name] MRN: [id number] DOB: Jan 21, 1941 Today's Date: 12/15/2021   History of present illness Pt is an 81 y.o. female who presented 12/02/21 for elective heart cath secondary to anginal symptoms of chest heaviness, pain between her shoulders, SOB, lower extremity edema. Found to have 3-vessel CAD, LVEF 55-60%. S/p CABG x3 1/23. PMH: CAD, DM, RA   OT comments  Pt choosing to go home with her supportive family with [facility] services. She will rely on her family's assistance for LB ADL. Educated in energy conservation strategies and sternal precautions related on ADL and IADL. Instructed in multiple uses of 3 in 1. Pt needing min to mod assist for sit to stand, reports having arthritis in her knees.    Recommendations for follow up therapy are one component of a multi-disciplinary discharge planning process, led by the attending physician.  Recommendations may be updated based on patient status, additional functional criteria and insurance authorization.    Follow Up Recommendations  Home health OT    Assistance Recommended at Discharge Frequent or constant Supervision/Assistance  Patient can return home with the following  A lot of help with bathing/dressing/bathroom;A lot of help with walking and/or transfers;Assistance with cooking/housework;Assist for transportation;Help with stairs or ramp for entrance   Equipment Recommendations  None recommended by OT    Recommendations for Other Services      Precautions / Restrictions Precautions Precautions: Fall;Sternal Precaution Booklet Issued: Yes (comment) Precaution Comments: reinforced sternal precautions Restrictions Weight Bearing Restrictions: Yes Other Position/Activity Restrictions: sternal precautions       Mobility Bed Mobility               General bed mobility comments: pt in chair    Transfers Overall transfer level: Needs assistance Equipment used: Rolling  walker (2 wheels) Transfers: Sit to/from Stand Sit to Stand: Mod assist, Min assist           General transfer comment: mod from chair, min from 3 in 1, cues for technique, use of momentum     Balance Overall balance assessment: Needs assistance Sitting-balance support: Feet supported, No upper extremity supported Sitting balance-Leahy Scale: Good     Standing balance support: During functional activity Standing balance-Leahy Scale: Fair Standing balance comment: Able to stand statically without UE support, bil UE support to ambulate                           ADL either performed or assessed with clinical judgement   ADL Overall ADL's : Needs assistance/impaired     Grooming: Wash/dry hands;Standing;Min guard                 Lower Body Dressing Details (indicate cue type and reason): pt will rely on her family's assistance for LB bathing and dressing Toilet Transfer: Min guard;Ambulation;BSC/3in1;Rolling walker (2 wheels)   Toileting- Clothing Manipulation and Hygiene: Set up;Adhering to back precautions       Functional mobility during ADLs: Min guard;Rolling walker (2 wheels) General ADL Comments: Pt reports having a walk in shower, educated pt in use of 3 in1 as shower seat. Educated in energy conservation strategies.    Extremity/Trunk Assessment              Vision       Perception     Praxis      Cognition Arousal/Alertness: Awake/alert Behavior During Therapy: WFL for tasks assessed/performed Overall Cognitive Status: Within Functional Limits for  tasks assessed                                          Exercises      Shoulder Instructions       General Comments VSS on RA.    Pertinent Vitals/ Pain       Pain Assessment Pain Assessment: Faces Faces Pain Scale: Hurts a little bit Pain Location: chest incision Pain Descriptors / Indicators: Sore Pain Intervention(s): Monitored during session  Home  Living                                          Prior Functioning/Environment              Frequency  Min 2X/week        Progress Toward Goals  OT Goals(current goals can now be found in the care plan section)  Progress towards OT goals: Progressing toward goals  Acute Rehab OT Goals OT Goal Formulation: With patient Time For Goal Achievement: 12/26/21 Potential to Achieve Goals: Good  Plan Discharge plan needs to be updated    Co-evaluation                 AM-PAC OT "6 Clicks" Daily Activity     Outcome Measure   Help from another person eating meals?: None Help from another person taking care of personal grooming?: A Little Help from another person toileting, which includes using toliet, bedpan, or urinal?: A Little Help from another person bathing (including washing, rinsing, drying)?: A Lot Help from another person to put on and taking off regular upper body clothing?: A Little Help from another person to put on and taking off regular lower body clothing?: A Lot 6 Click Score: 17    End of Session Equipment Utilized During Treatment: Rolling walker (2 wheels)  OT Visit Diagnosis: Unsteadiness on feet (R26.81);Other abnormalities of gait and mobility (R26.89);Pain;Muscle weakness (generalized) (M62.81)   Activity Tolerance Patient tolerated treatment well   Patient Left in chair;with call bell/phone within reach;with family/visitor present   Nurse Communication          Time: 4742-5956 OT Time Calculation (min): 22 min  Charges: OT General Charges $OT Visit: 1 Visit OT Treatments $Self Care/Home Management : 8-22 mins  Martie Round, OTR/L Acute Rehabilitation Services Pager: [phone] Office: [phone]   Evern Bio 12/15/2021, 3:29 PM

## 2021-12-15 NOTE — Progress Notes (Signed)
Inpatient Rehab Admissions Coordinator:  Saw pt at bedside. She has decided that she would like to receive [facility] therapy. TOC made aware. AC will sign off.    Gayland Curry, [place], [place] Admissions Coordinator [phone]

## 2021-12-15 NOTE — Care Management Important Message (Signed)
Important Message  Patient Details  Name: [name] [name] MRN: [id number] Date of Birth: 04-15-1941   Medicare Important Message Given:  Yes     Renie Ora 12/15/2021, 8:03 AM

## 2021-12-15 NOTE — TOC Progression Note (Signed)
Transition of Care (TOC) - Progression Note  Marvetta Gibbons RN, BSN Transitions of Care Unit 4E- RN Case Manager See Treatment Team for direct phone #    Patient Details  Name: [name] [name] MRN: [id number] Date of Birth: 1941/07/09  Transition of Care [facility]) CM/SW Contact  Dahlia Client Romeo Rabon, RN Phone Number: 12/15/2021, 11:08 AM  Clinical Narrative:    CM spoke with pt at bedside regarding transition needs. Per pt she plans to return home, with plan for brother to stay with her. She also reports that she had children that can also assist and stay at night if needed.  Order for HHPT has been placed, as well as DME order for RW- per pt she would like to check with family about RW- to see if there is one she can borrow. List provided to pt for [facility] choice- pt states she would like time to review list. This writer will return tomorrow to speak with pt again and review transition plans/needs with pt. [facility] referral pending pt choice.      Expected Discharge Plan: IP Rehab Facility Barriers to Discharge: Continued Medical Work up  Expected Discharge Plan and Services Expected Discharge Plan: [place] In-house Referral: NA Discharge Planning Services: CM Consult Post Acute Care Choice: Durable Medical Equipment, Home Health Living arrangements for the past 2 months: Single Family Home                 DME Arranged: Walker rolling DME Agency: [employer]       HH Arranged: PT           Social Determinants of Health (SDOH) Interventions    Readmission Risk Interventions No flowsheet data found.

## 2021-12-15 NOTE — Progress Notes (Addendum)
° °   °  [address]       [place] [address]             [phone]      7 Days Post-Op Procedure(s) (LRB): CORONARY ARTERY BYPASS GRAFTING (CABG) X 3, ON PUMP< USING LEFT INTERNAL MAMMARY ARTERY AND RIGHT ENDOSCOPIC GREATER SAPHENOUS VEIN CONDUITS (N/A) TRANSESOPHAGEAL ECHOCARDIOGRAM (TEE) (N/A) APPLICATION OF CELL SAVER ENDOVEIN HARVEST OF GREATER SAPHENOUS VEIN (Right)  Subjective:  Patient with no complaints.  Patients mouth remains sore, but is improved.  She again states her brother will provide assistance at discharge.  + ambulation  + BM  Objective: Vital signs in last 24 hours: Temp:  [97.8 F (36.6 C)-98.4 F (36.9 C)] 97.9 F (36.6 C) (01/30 0447) Pulse Rate:  [62-76] 63 (01/30 0447) Cardiac Rhythm: Normal sinus rhythm (01/29 1904) Resp:  [16-20] 16 (01/30 0447) BP: (112-140)/(57-71) 132/60 (01/30 0447) SpO2:  [97 %-100 %] 97 % (01/30 0447) Weight:  [72.7 kg] 72.7 kg (01/30 0447)  Intake/Output from previous day: 01/29 0701 - 01/30 0700 In: 240 [P.O.:240] Out: -   General appearance: alert, cooperative, and no distress Heart: regular rate and rhythm Lungs: clear to auscultation bilaterally Abdomen: soft, non-tender; bowel sounds normal; no masses,  no organomegaly Extremities: edema trace, pain in RLE Wound: clean and dry  Lab Results: Recent Labs    12/14/21 0419 12/15/21 0110  WBC 8.6 10.2  HGB 8.0* 8.6*  HCT 24.9* 26.5*  PLT 208 259   BMET:  Recent Labs    12/14/21 0419 12/15/21 0110  NA 137 137  K 4.3 4.6  CL 105 105  CO2 24 24  GLUCOSE 132* 105*  BUN 9 10  CREATININE 0.79 0.80  CALCIUM 8.0* 8.1*    PT/INR: No results for input(s): LABPROT, INR in the last 72 hours. ABG    Component Value Date/Time   PHART 7.375 12/09/2021 1906   HCO3 21.3 12/09/2021 1906   TCO2 22 12/09/2021 1906   ACIDBASEDEF 4.0 (H) 12/09/2021 1906   O2SAT 28.4 12/13/2021 0311   CBG (last 3)  Recent Labs    12/14/21 1556 12/14/21 2038  12/15/21 0629  GLUCAP 143* 99 93    Assessment/Plan: S/P Procedure(s) (LRB): CORONARY ARTERY BYPASS GRAFTING (CABG) X 3, ON PUMP< USING LEFT INTERNAL MAMMARY ARTERY AND RIGHT ENDOSCOPIC GREATER SAPHENOUS VEIN CONDUITS (N/A) TRANSESOPHAGEAL ECHOCARDIOGRAM (TEE) (N/A) APPLICATION OF CELL SAVER ENDOVEIN HARVEST OF GREATER SAPHENOUS VEIN (Right)  CV- Sinus Bradycardia, BP is elevating- continue Lopressor, will restart home Cozaar at reduced dose Pulm- off oxygen, continue IS Renal- if accurate weight is up about 13 lbs, creatinine, K are WNL, continue Lasix, potassium GI- constipation resolved Thrush- continue Nystatin, per patient feeling a little better DM- continue insulin Dispo- patient stable, restart home Cozaar for elevated BP, continue diuretics, Nystatin for thrush.. will monitor today if tolerates addition of Cozaar and HR remains stable, will plan to d/c in AM   LOS: 13 days   Lowella Dandy, PA-C 12/15/2021  Agree with above. Doing well. Dispo planning.  Sarahann Horrell Keane Scrape

## 2021-12-16 LAB — CBC
HCT: 25.9 % — ABNORMAL LOW (ref 36.0–46.0)
Hemoglobin: 8.6 g/dL — ABNORMAL LOW (ref 12.0–15.0)
MCH: 31 pg (ref 26.0–34.0)
MCHC: 33.2 g/dL (ref 30.0–36.0)
MCV: 93.5 fL (ref 80.0–100.0)
Platelets: 287 10*3/uL (ref 150–400)
RBC: 2.77 MIL/uL — ABNORMAL LOW (ref 3.87–5.11)
RDW: 16.4 % — ABNORMAL HIGH (ref 11.5–15.5)
WBC: 12.3 10*3/uL — ABNORMAL HIGH (ref 4.0–10.5)
nRBC: 0.7 % — ABNORMAL HIGH (ref 0.0–0.2)

## 2021-12-16 LAB — GLUCOSE, CAPILLARY: Glucose-Capillary: 117 mg/dL — ABNORMAL HIGH (ref 70–99)

## 2021-12-16 MED ORDER — NYSTATIN 100000 UNIT/ML MT SUSP: 5.0000 mL | Freq: Four times a day (QID) | OROMUCOSAL | 0 refills | Status: AC

## 2021-12-16 MED ORDER — OXYCODONE HCL 5 MG PO TABS: 5.0000 mg | ORAL_TABLET | ORAL | 0 refills | Status: DC | PRN

## 2021-12-16 MED ORDER — ACETAMINOPHEN 325 MG PO TABS
650.0000 mg | ORAL_TABLET | Freq: Four times a day (QID) | ORAL | Status: AC | PRN
Start: 2021-12-16 — End: ?

## 2021-12-16 MED ORDER — METOPROLOL TARTRATE 25 MG PO TABS: 12.5000 mg | ORAL_TABLET | Freq: Two times a day (BID) | ORAL | 3 refills | Status: DC

## 2021-12-16 NOTE — TOC Transition Note (Signed)
Transition of Care (TOC) - CM/SW Discharge Note Marvetta Gibbons RN, BSN Transitions of Care Unit 4E- RN Case Manager See Treatment Team for direct phone #    Patient Details  Name: [name] [name] MRN: [id number] Date of Birth: [birth year]  Transition of Care [facility]) CM/SW Contact:  Dawayne Patricia, RN Phone Number: 12/16/2021, 10:59 AM   Clinical Narrative:    Pt stable for transition home today, CM spoke with pt and brother at bedside to f/u on [facility] choice- they have selected [facility] as first choice for  HHPT needs.  Per PT pt has decided she would like a rollator instead of regular RW- order modified for rollator.   Address, phone # and PCP all confirmed in epic.   Brother to transport pt home and stay with pt to assist post discharge.   Call made to [employer] for DME need- rollator to be delivered to room prior to discharge.   Call made to [facility] with [facility] for [place] - referral has been accepted.    Final next level of care: [place] Barriers to Discharge: Barriers Resolved   Patient Goals and CMS Choice Patient states their goals for this hospitalization and ongoing recovery are:: to go back to being independent CMS Medicare.gov Compare Post Acute Care list provided to:: Patient Choice offered to / list presented to : Patient  Discharge Placement               Home w/ [facility]        Discharge Plan and Services In-house Referral: NA Discharge Planning Services: CM Consult Post Acute Care Choice: Durable Medical Equipment, Home Health          DME Arranged: Walker rolling with seat DME Agency: [facility] Date DME Agency Contacted: 12/16/21 Time DME Agency Contacted: [phone] Representative spoke with at DME Agency: Freda Munro HH Arranged: PT [place]: [facility] [place] Date [place]: 12/16/21 Time [place]: 1059 Representative spoke with at [place]: [place] ([place]) Interventions     Readmission  Risk Interventions Readmission Risk Prevention Plan 12/16/2021  Transportation Screening Complete  PCP or Specialist Appt within 5-7 Days Complete  Home Care Screening Complete  Medication Review (RN CM) Complete  Some recent data might be hidden

## 2021-12-16 NOTE — Progress Notes (Signed)
Discussed with pt and brother IS, sternal precautions, standing with gait belt, diet (DM and decreased sodium), exercise, and CRPII. Pt receptive. Will refer to [facility] CRPII.  [phone] Ethelda Chick CES, ACSM 11:45 AM 12/16/2021

## 2021-12-16 NOTE — Progress Notes (Signed)
Physical Therapy Treatment Patient Details Name: [name] [name] MRN: [id number] DOB: Apr 02, 1941 Today's Date: 12/16/2021   History of Present Illness Pt is an 81 y.o. female who presented 12/02/21 for elective heart cath secondary to anginal symptoms of chest heaviness, pain between her shoulders, SOB, lower extremity edema. Found to have 3-vessel CAD, LVEF 55-60%. S/p CABG x3 1/23. PMH: CAD, DM, RA    PT Comments    Pt admitted with above diagnosis. Pt was able to ambulate to practice steps with min assist. Pt practiced with rollator as well and wants to get a rollator. Plan is to go home today.  Pt currently with functional limitations due to balance and endurance deficits. Pt will benefit from skilled PT to increase their independence and safety with mobility to allow discharge to the venue listed below.      Recommendations for follow up therapy are one component of a multi-disciplinary discharge planning process, led by the attending physician.  Recommendations may be updated based on patient status, additional functional criteria and insurance authorization.  Follow Up Recommendations  Home health PT     Assistance Recommended at Discharge Frequent or constant Supervision/Assistance  Patient can return home with the following A lot of help with bathing/dressing/bathroom;Assistance with cooking/housework;Assist for transportation;Help with stairs or ramp for entrance;A lot of help with walking and/or transfers   Equipment Recommendations  Other (comment);Rollator (4 wheels) (Shower stool)    Recommendations for Other Services       Precautions / Restrictions Precautions Precautions: Fall;Sternal Precaution Booklet Issued: Yes (comment) Precaution Comments: reinforced sternal precautions Restrictions Weight Bearing Restrictions: Yes (sternal precautions) Other Position/Activity Restrictions: sternal precautions     Mobility  Bed Mobility               General bed  mobility comments: pt in chair    Transfers Overall transfer level: Needs assistance Equipment used: Rollator (4 wheels) Transfers: Sit to/from Stand Sit to Stand: Mod assist           General transfer comment: mod assist to standfrom chair, practiced a few times as this is difficult forpt.  Gave cues to widen BOS which helped and she needed cues for hand placement on knees. Pt uses momentum.    Ambulation/Gait Ambulation/Gait assistance: Min guard Gait Distance (Feet): 160 Feet (80 feet x 2) Assistive device: Rollator (4 wheels) Gait Pattern/deviations: Step-through pattern, Decreased stride length Gait velocity: reduced Gait velocity interpretation: <1.31 ft/sec, indicative of household ambulator   General Gait Details: SLow, steady gait with RW. 1 seated rest break on rollator.  [place] pt technique for sitting and locking brakes.   Stairs Stairs: Yes Stairs assistance: Min assist, Min guard Stair Management: One rail Left, Alternating pattern, Forwards Number of Stairs: 3 General stair comments: Pt able to ascend and descend steps wtih min assist and cues overall using the left rail.  She states her family can assist her on steps at home.   Wheelchair Mobility    Modified Rankin (Stroke Patients Only)       Balance Overall balance assessment: Needs assistance Sitting-balance support: Feet supported, No upper extremity supported Sitting balance-Leahy Scale: Good Sitting balance - Comments: Static sitting EOB with supervision for safety.   Standing balance support: During functional activity Standing balance-Leahy Scale: Fair Standing balance comment: Able to stand statically without UE support, bil UE support to ambulate  Cognition Arousal/Alertness: Awake/alert Behavior During Therapy: WFL for tasks assessed/performed Overall Cognitive Status: Within Functional Limits for tasks assessed                                  General Comments: Follows cues appropriately for tasks performed this date.        Exercises Other Exercises Other Exercises: Sit to stand from low chair x3 with cues for foot placement, scooting forwards and forward momentum/weight shift and controlled descent    General Comments General comments (skin integrity, edema, etc.): VSS on RA      Pertinent Vitals/Pain Pain Assessment Pain Assessment: Faces Faces Pain Scale: Hurts little more Pain Location: bil LEs Pain Descriptors / Indicators: Sore Pain Intervention(s): Limited activity within patient's tolerance, Monitored during session, Repositioned, Patient requesting pain meds-RN notified    Home Living                          Prior Function            PT Goals (current goals can now be found in the care plan section) Acute Rehab PT Goals Patient Stated Goal: to get better Progress towards PT goals: Progressing toward goals    Frequency    Min 3X/week      PT Plan Current plan remains appropriate    Co-evaluation              AM-PAC PT "6 Clicks" Mobility   Outcome Measure  Help needed turning from your back to your side while in a flat bed without using bedrails?: A Lot Help needed moving from lying on your back to sitting on the side of a flat bed without using bedrails?: A Lot Help needed moving to and from a bed to a chair (including a wheelchair)?: A Little Help needed standing up from a chair using your arms (e.g., wheelchair or bedside chair)?: A Lot Help needed to walk in hospital room?: A Little Help needed climbing 3-5 steps with a railing? : A Little 6 Click Score: 15    End of Session Equipment Utilized During Treatment: Gait belt Activity Tolerance: Patient tolerated treatment well Patient left: in chair;with call bell/phone within reach Nurse Communication: Mobility status PT Visit Diagnosis: Unsteadiness on feet (R26.81);Other abnormalities of gait and  mobility (R26.89);Muscle weakness (generalized) (M62.81);Difficulty in walking, not elsewhere classified (R26.2);Pain Pain - part of body:  (chest)     Time: [id number] PT Time Calculation (min) (ACUTE ONLY): 33 min  Charges:  $Gait Training: 23-37 mins                     Egan Sahlin M,PT Acute Rehab Services [phone] (pager)    Alvira Philips 12/16/2021, 11:45 AM

## 2021-12-23 ENCOUNTER — Telehealth (HOSPITAL_COMMUNITY): Payer: Self-pay

## 2021-12-23 NOTE — Telephone Encounter (Signed)
Pt insurance is active and benefits verified through [employer] $20, DED 0/0 met, out of pocket $4,000/$109.06 met, co-insurance 0%. no pre-authorization required. Passport, 12/22/2021@4 :29pm, REF# [id number]   Will contact patient to see if she is interested in the Cardiac Rehab Program. If interested, patient will need to complete follow up appt. Once completed, patient will be contacted for scheduling upon review by the RN Navigator.

## 2021-12-23 NOTE — Telephone Encounter (Signed)
Called patient to see if she is interested in the Cardiac Rehab Program. Patient expressed interest. Explained scheduling process and went over insurance, patient verbalized understanding. Will contact patient for scheduling once f/u has been completed. 

## 2021-12-24 ENCOUNTER — Other Ambulatory Visit: Payer: Self-pay | Admitting: Physician Assistant

## 2021-12-24 ENCOUNTER — Telehealth: Payer: Self-pay | Admitting: *Deleted

## 2021-12-24 MED ORDER — TRAMADOL HCL 50 MG PO TABS: 50.0000 mg | ORAL_TABLET | Freq: Four times a day (QID) | ORAL | 0 refills | Status: DC | PRN

## 2021-12-24 NOTE — Telephone Encounter (Signed)
-----   Message from Ardelle Balls, [place] sent at 12/24/2021  9:15 AM EST ----- Regarding: RE: pain meds I will place prescription for Ultram through Epic to her pharmacy. Thanks. ----- Message ----- From: Ludwig Clarks, RN Sent: 12/24/2021   9:12 AM EST To: Ardelle Balls, PA-C Subject: pain meds                                      Hey,  Ms. Lichter is s/p CABG 1/23 by Dr. Cliffton Asters. She was discharged 1/31 with Oxycodone. Her brother called stating the oxycodone is causing hallucinations. She has stopped taking the oxy and has been trying to manage her pain with Tylenol but it's not helping as much as she would like. Please advise.   Thanks,  [employer]

## 2021-12-25 DIAGNOSIS — E785 Hyperlipidemia, unspecified: Secondary | ICD-10-CM | POA: Diagnosis not present

## 2021-12-25 DIAGNOSIS — E119 Type 2 diabetes mellitus without complications: Secondary | ICD-10-CM | POA: Diagnosis not present

## 2021-12-25 DIAGNOSIS — I2510 Atherosclerotic heart disease of native coronary artery without angina pectoris: Secondary | ICD-10-CM | POA: Diagnosis not present

## 2021-12-25 DIAGNOSIS — I10 Essential (primary) hypertension: Secondary | ICD-10-CM | POA: Diagnosis not present

## 2021-12-26 ENCOUNTER — Other Ambulatory Visit: Payer: Self-pay

## 2021-12-26 ENCOUNTER — Ambulatory Visit (INDEPENDENT_AMBULATORY_CARE_PROVIDER_SITE_OTHER): Payer: Self-pay | Admitting: Thoracic Surgery (Cardiothoracic Vascular Surgery)

## 2021-12-26 DIAGNOSIS — Z951 Presence of aortocoronary bypass graft: Secondary | ICD-10-CM

## 2021-12-26 NOTE — Progress Notes (Signed)
°   °  [address]       [name] [name] [address]             [phone]       Patient: [name] [name] Provider: Office Consent for Telemedicine visit obtained.  Todays visit was completed via a real-time telehealth (see specific modality noted below). The patient/authorized person provided oral consent at the time of the visit to engage in a telemedicine encounter with the present provider at [facility]. The patient/authorized person was informed of the potential benefits, limitations, and risks of telemedicine. The patient/authorized person expressed understanding that the laws that protect confidentiality also apply to telemedicine. The patient/authorized person acknowledged understanding that telemedicine does not provide emergency services and that he or she would need to call 911 or proceed to the nearest hospital for help if such a need arose.   Total time spent in the clinical discussion 10 minutes.  Telehealth Modality: Phone visit (audio only)  I had a telephone visit with  [name] [name] who is s/p CABG.  Overall doing well.   Pain is minimal.  Ambulating well. Vitals have been stable.  [name] [name] will see [place] back in 1 month with a chest x-ray for cardiac rehab clearance.  Yoandri Congrove Keane Scrape

## 2021-12-29 ENCOUNTER — Encounter: Payer: Self-pay | Admitting: Physician Assistant

## 2021-12-29 DIAGNOSIS — I10 Essential (primary) hypertension: Secondary | ICD-10-CM

## 2021-12-29 HISTORY — DX: Essential (primary) hypertension: I10

## 2021-12-29 NOTE — Progress Notes (Signed)
Cardiology Office Note:    Date:  12/30/2021   ID:  [name] [name], DOB 02-16-1941, MRN [id number]  PCP:  Janie Morning, DO  [facility] Providers Cardiologist:  Mertie Moores, MD Cardiology APP:  Sharmon Revere     Referring MD: Janie Morning, DO   Chief Complaint:  Hospitalization Follow-up (S/p CABG)    Patient Profile: Coronary artery disease  S/p MI 06/1996 tx with POBA to LCx Cath 12/2018: dLAD 80, pRCA 85 (small) >> med Rx; PCI of dLAD only if refractory angina Intol of Toprol (arthralgias) >> changed to Imdur S/p CABG in 1/23 (LIMA-LAD, SVG-OM, SVG-PL) Diabetes mellitus Rheumatoid arthritis  Hypertension  Carotid artery Dz [place] 2011: 0-39% bilat ICA  Hyperlipidemia Statin intolerant; intol of Zetia  Intol of Evolocumab (needle phobia) Bempedoic acid   Prior CV Studies: Echocardiogram 12/02/21 EF 60-65, no RWMA, normal RVSF, RVSF 23.5, trivial MR  Cardiac catheterization 12/02/21 dLM 50 oLAD 95, mid 80, apical 95 pLCx 30-50 pRCA 90-95; Lg bifurcating AM 95  Pre-CABG dopplers [id number]    History of Present Illness:   [name] [name] is a 81 y.o. female with the above problem list.  She was recently seen by Dr. Acie Fredrickson 11/27/2021 with symptoms of progressive angina.  She underwent cardiac catheterization which demonstrated 3 v CAD.  She was admitted 1/17 to 1/30 and underwent CABG with Dr. Kipp Brood (L-LAD, S-OM, S-PL) and endoscopic vein harvesting.  Post CABG course was notable for anemia and required Platelet/PRBC transfusion, volume overload and required diuresis and oral thrush  CIR was recommended but the patient opted to go home with [facility].  She has an appt with our [facility] on 2/22 to evaluate for alternative lipid management.    She returns for f/u.  She is here with her brother.  She seems to be progressing well since DC from the hospital. Her chest soreness is improved.  She has not had significant shortness of breath.   She has not  had orthopnea.  Her R leg is still painful but is improving.  She has some swelling but this improves with compression stockings.  She has not had syncope.  Her appetite is improving.  She is having normal BMs. She has not had fever, cough.      Past Medical History:  Diagnosis Date   CAD (coronary artery disease)    s/p MI in 1997 tx with POBA to LCx // s/p CABG 11/2021   Carotid artery disease ([place])    [place] 1/23: L 1-39   Diabetes mellitus    Drug therapy    Intermittent steroid use   Dyslipidemia    Edema    Ejection fraction    EF 60%, echo, November, 2011, trivial pericardial effusion // Echo 1/23: EF 60-65   HTN (hypertension) 12/29/2021   Rheumatoid arthritis(714.0)    Hospitalization August, 2011, severe RA flare,    Statin intolerance    Current Medications: Current Meds  Medication Sig   Abatacept 125 MG/ML SOSY Inject 1 Dose into the skin every 30 (thirty) days.   acetaminophen (TYLENOL) 325 MG tablet Take 2 tablets (650 mg total) by mouth every 6 (six) hours as needed for moderate pain, fever or headache.   ALPRAZolam (XANAX) 0.25 MG tablet Take 0.25 mg by mouth daily as needed.   aspirin 81 MG tablet Take 1 tablet (81 mg total) by mouth daily.   B-D UF III MINI PEN NEEDLES 31G X 5 MM MISC USE  WITH INSULIN TO INJECT BID UTD   Cholecalciferol (VITAMIN D-3) 1000 units CAPS Take 1,000 Units by mouth daily.    folic acid (FOLVITE) 1 MG tablet Take 1 mg by mouth every evening.    Insulin Lispro Prot & Lispro (HUMALOG 75/25 MIX) (75-25) 100 UNIT/ML Kwikpen Inject 3-5 Units into the skin See admin instructions. Inject 5 units in the morning and 3 units in the evening   losartan (COZAAR) 25 MG tablet Take 1 tablet (25 mg total) by mouth daily.   methotrexate 250 MG/10ML injection Inject 10 mg into the muscle every Sunday.   metoprolol tartrate (LOPRESSOR) 25 MG tablet Take 0.5 tablets (12.5 mg total) by mouth 2 (two) times daily.   Multiple Vitamin (MULTIVITAMIN WITH MINERALS) TABS  tablet Take 1 tablet by mouth daily. Centrum silver   ONETOUCH VERIO test strip USE 1 STRIP TID UTD   pantoprazole (PROTONIX) 40 MG tablet Take 40 mg by mouth daily.   traMADol (ULTRAM) 50 MG tablet Take 1 tablet (50 mg total) by mouth every 6 (six) hours as needed for severe pain.   [DISCONTINUED] furosemide (LASIX) 40 MG tablet Take 1 tablet (40 mg total) by mouth every Monday, Wednesday, and Friday.   [DISCONTINUED] potassium chloride (KLOR-CON) 10 MEQ tablet Take 1 tablet (10 mEq total) by mouth every Monday, Wednesday, and Friday.    Allergies:   Cholesterol, Hydrocodone, Statins, and Metformin and related   Social History   Tobacco Use   Smoking status: Former    Types: Cigarettes    Quit date: 11/16/1994    Years since quitting: 27.1   Smokeless tobacco: Never  Vaping Use   Vaping Use: Never used  Substance Use Topics   Alcohol use: No    Alcohol/week: 0.0 standard drinks   Drug use: No    Family Hx: The patient's family history includes Diabetes in her mother; Hypertension in her mother; Other in her father.  ROS see HPI  EKGs/Labs/Other Test Reviewed:    EKG:  EKG is   ordered today.  The ekg ordered today demonstrates NSR, HR 73, normal axis, TW inversions V1-2, QTc 447 ms  Recent Labs: 12/06/2021: ALT 25 12/10/2021: Magnesium 2.1 12/15/2021: BUN 10; Creatinine, Ser 0.80; Potassium 4.6; Sodium 137 12/16/2021: Hemoglobin 8.6; Platelets 287   Recent Lipid Panel Recent Labs    11/27/21 1120  CHOL 170  TRIG 37  HDL 73  LDLCALC 89     Risk Assessment/Calculations:         Physical Exam:    VS:  BP (!) 142/62 (BP Location: Right Arm, Patient Position: Sitting, Cuff Size: Normal)    Pulse 73    Ht 5\' 2"  (1.575 m)    Wt 140 lb 3.2 oz (63.6 kg)    SpO2 99%    BMI 25.64 kg/m     Wt Readings from Last 3 Encounters:  12/30/21 140 lb 3.2 oz (63.6 kg)  12/15/21 160 lb 4.4 oz (72.7 kg)  11/27/21 147 lb 3.2 oz (66.8 kg)    Constitutional:      Appearance: Healthy  appearance. Not in distress.  Neck:     Vascular: No JVR. JVD normal.  Pulmonary:     Effort: Pulmonary effort is normal.     Breath sounds: No wheezing. No rales.  Cardiovascular:     Normal rate. Regular rhythm. Normal S1. Normal S2.      Murmurs: There is no murmur.     Comments: Median sternotomy well-healed without erythema  or discharge; right leg vein harvesting site well-healed without erythema or discharge Edema:    Peripheral edema present.    Pretibial: trace edema of the left pretibial area and 1+ edema of the right pretibial area.    Ankle: trace edema of the left ankle and 1+ edema of the right ankle. Abdominal:     Palpations: Abdomen is soft.  Skin:    General: Skin is warm and dry.  Neurological:     Mental Status: Alert and oriented to person, place and time.     Cranial Nerves: Cranial nerves are intact.        ASSESSMENT & PLAN:   CAD (coronary artery disease) She seems to be progressing well after recent CABG.  She is working with home health physical therapy.  Her first visit was just recently.  She will be with home health physical therapy for several weeks.  I have encouraged her to pursue cardiac rehab once she has completed home health physical therapy.  She is not on statin therapy due to prior intolerance.  Continue aspirin. I noted that she is only taking aspirin 81 mg daily after she left the office.  I called her and advised her to increase her ASA to 325 mg once daily for 3 mos, then resume 81 mg once daily.  She has a hard time taking K+.  Her weight has decreased significantly.  I think she can change her Lasix and K+ to as needed.  She will continue to wear compression hose.  If she has to take Lasix often, we can change her to liquid K+.  F/u in 3 mos.   HTN (hypertension) BPs at home run 130s/70s.  Continue Losartan 25 mg once daily, metoprolol 12.5 mg twice daily.  F/u BMET today.  HLD (hyperlipidemia) She is intol of statins, bempedoic acid and  could not do her own PCSK9i injections.  She has an appt with the [facility] next week.  Hopefully, she can get approved for siRNA (Inclisiran).    Rheumatoid arthritis ([place]) She asked about when she could schedule her biologic injection for RA.  I do not think there is any reason to hold it from a cardiology perspective.  But, I asked her to discuss with Dr. Kipp Brood at her visit with him to make sure he does not have any concerns from a surgical perspective.   Acute blood loss anemia Check f/u CBC today.       Cardiac Rehabilitation Eligibility Assessment  The patient is NOT ready to start cardiac rehabilitation due to: The patient is currently in a SNF or HHPT program. (She can start [place] once HHPT is complete)         Dispo:  Return in about 2 months (around 02/27/2022) for Routine Follow Up, w/ Richardson Dopp, PA-C..   Medication Adjustments/Labs and Tests Ordered: Current medicines are reviewed at length with the patient today.  Concerns regarding medicines are outlined above.  Tests Ordered: Orders Placed This Encounter  Procedures   Basic Metabolic Panel (BMET)   CBC   EKG 12-Lead   Medication Changes: Meds ordered this encounter  Medications   furosemide (LASIX) 40 MG tablet    Sig: Take 1 tablet (40 mg total) by mouth as needed for fluid (wt gain of 3 lbs in 24 hours.).    Dispense:  39 tablet    Refill:  3   potassium chloride (KLOR-CON) 10 MEQ tablet    Sig: Take 1 tablet (10 mEq  total) by mouth as needed (wt gain of 3 lbs in 24 hours to be taken with Lasix).    Dispense:  39 tablet    Refill:  3   Signed, Richardson Dopp, PA-C  12/30/2021 12:31 PM    [facility] [address], [place], [place]  [address] Phone: [phone]; Fax: [phone]

## 2021-12-30 ENCOUNTER — Ambulatory Visit: Payer: Medicare PPO | Admitting: Physician Assistant

## 2021-12-30 ENCOUNTER — Other Ambulatory Visit: Payer: Self-pay

## 2021-12-30 ENCOUNTER — Encounter: Payer: Self-pay | Admitting: Physician Assistant

## 2021-12-30 VITALS — BP 142/62 | HR 73 | Ht 62.0 in | Wt 140.2 lb

## 2021-12-30 DIAGNOSIS — I2510 Atherosclerotic heart disease of native coronary artery without angina pectoris: Secondary | ICD-10-CM

## 2021-12-30 DIAGNOSIS — E7800 Pure hypercholesterolemia, unspecified: Secondary | ICD-10-CM | POA: Diagnosis not present

## 2021-12-30 DIAGNOSIS — M069 Rheumatoid arthritis, unspecified: Secondary | ICD-10-CM

## 2021-12-30 DIAGNOSIS — D62 Acute posthemorrhagic anemia: Secondary | ICD-10-CM

## 2021-12-30 DIAGNOSIS — I10 Essential (primary) hypertension: Secondary | ICD-10-CM | POA: Diagnosis not present

## 2021-12-30 LAB — BASIC METABOLIC PANEL
BUN/Creatinine Ratio: 12 (ref 12–28)
BUN: 9 mg/dL (ref 8–27)
CO2: 22 mmol/L (ref 20–29)
Calcium: 9.3 mg/dL (ref 8.7–10.3)
Chloride: 104 mmol/L (ref 96–106)
Creatinine, Ser: 0.78 mg/dL (ref 0.57–1.00)
Glucose: 140 mg/dL — ABNORMAL HIGH (ref 70–99)
Potassium: 5 mmol/L (ref 3.5–5.2)
Sodium: 137 mmol/L (ref 134–144)
eGFR: 77 mL/min/{1.73_m2} (ref >59)

## 2021-12-30 LAB — CBC
Hematocrit: 30.9 % — ABNORMAL LOW (ref 34.0–46.6)
Hemoglobin: 10 g/dL — ABNORMAL LOW (ref 11.1–15.9)
MCH: 30.2 pg (ref 26.6–33.0)
MCHC: 32.4 g/dL (ref 31.5–35.7)
MCV: 93 fL (ref 79–97)
Platelets: 475 10*3/uL — ABNORMAL HIGH (ref 150–450)
RBC: 3.31 x10E6/uL — ABNORMAL LOW (ref 3.77–5.28)
RDW: 15.3 % (ref 11.7–15.4)
WBC: 6.4 10*3/uL (ref 3.4–10.8)

## 2021-12-30 MED ORDER — FUROSEMIDE 40 MG PO TABS: 40.0000 mg | ORAL_TABLET | ORAL | 3 refills | Status: AC | PRN

## 2021-12-30 MED ORDER — POTASSIUM CHLORIDE ER 10 MEQ PO TBCR: 10.0000 meq | EXTENDED_RELEASE_TABLET | ORAL | 3 refills | Status: AC | PRN

## 2021-12-30 NOTE — Assessment & Plan Note (Signed)
She asked about when she could schedule her biologic injection for RA.  I do not think there is any reason to hold it from a cardiology perspective.  But, I asked her to discuss with Dr. Cliffton AstersLightfoot at her visit with him to make sure he does not have any concerns from a surgical perspective.

## 2021-12-30 NOTE — Assessment & Plan Note (Signed)
She is intol of statins, bempedoic acid and could not do her own PCSK9i injections.  She has an appt with the [facility] Lipid clinic next week.  Hopefully, she can get approved for siRNA (Inclisiran).

## 2021-12-30 NOTE — Assessment & Plan Note (Signed)
Check f/u CBC today.

## 2021-12-30 NOTE — Assessment & Plan Note (Signed)
BPs at home run 130s/70s.  Continue Losartan 25 mg once daily, metoprolol 12.5 mg twice daily.  F/u BMET today.

## 2021-12-30 NOTE — Assessment & Plan Note (Addendum)
She seems to be progressing well after recent CABG.  She is working with home health physical therapy.  Her first visit was just recently.  She will be with home health physical therapy for several weeks.  I have encouraged her to pursue cardiac rehab once she has completed home health physical therapy.  She is not on statin therapy due to prior intolerance.  Continue aspirin. I noted that she is only taking aspirin 81 mg daily after she left the office.  I called her and advised her to increase her ASA to 325 mg once daily for 3 mos, then resume 81 mg once daily.  She has a hard time taking K+.  Her weight has decreased significantly.  I think she can change her Lasix and K+ to as needed.  She will continue to wear compression hose.  If she has to take Lasix often, we can change her to liquid K+.  F/u in 3 mos.

## 2021-12-30 NOTE — Patient Instructions (Signed)
Medication Instructions:   CHANGE LASIX and POTASSIUM TO as needed for weight gain of 3 lbs in 24 hours or increase swelling.  If you need to take this often please call @ [phone] or send mychart message so office can send in liquid potassium.   *If you need a refill on your cardiac medications before your next appointment, please call your pharmacy*   Lab Work:  TODAY!!!!!  BMET/CBC  If you have labs (blood work) drawn today and your tests are completely normal, you will receive your results only by: MyChart Message (if you have MyChart) OR A paper copy in the mail If you have any lab test that is abnormal or we need to change your treatment, we will call you to review the results.   Testing/Procedures:  None ordered.   Follow-Up: At [facility], you and your health needs are our priority.  As part of our continuing mission to provide you with exceptional heart care, we have created designated Provider Care Teams.  These Care Teams include your primary Cardiologist (physician) and Advanced Practice Providers (APPs -  Physician Assistants and Nurse Practitioners) who all work together to provide you with the care you need, when you need it.  We recommend signing up for the patient portal called "MyChart".  Sign up information is provided on this After Visit Summary.  MyChart is used to connect with patients for Virtual Visits (Telemedicine).  Patients are able to view lab/test results, encounter notes, upcoming appointments, etc.  Non-urgent messages can be sent to your provider as well.   To learn more about what you can do with MyChart, go to [email or web address].    Your next appointment:   2 month(s)  The format for your next appointment:   In Person  Provider:   Tereso NewcomerScott Weaver, PA-C         Other Instructions  Keep all other appointments.

## 2022-01-02 NOTE — H&P (Signed)
Cardiology Admission History and Physical:   Patient ID: [name] [name] MRN: [id number]; DOB: [birth year]   Admission date: 12/02/2021  PCP:  Irena Reichmannollins, Dana, DO   [facility] Providers Cardiologist:  Kristeen MissPhilip Nahser, MD  Cardiology APP:  Beatrice LecherWeaver, Scott T, PA-C       Chief Complaint:  Chest pain  Patient Profile:   [name] [name] is a 81 y.o. female with   rheumatoid arthritis, type 2 diabetes, dyslipidemia and statin intolerance as well as coronary artery disease.  She had some known disease of the right coronary artery in 2004 and recently had heart catheterization in February 2024 somewhat atypical chest pain.  She was found to have 85% proximal RCA stenosis of a small vessel as well as an 80% distal LAD stenosis and 30% proximal LAD stenosis.who is being seen 01/02/2022 for coronary angiography.  History of Present Illness:   [name] [name] is referred for coronary angiography because of a history of progressively more frequent chest tightness and DOE. Has a history of chronic stable CAD with stable angina.   Past Medical History:  Diagnosis Date   CAD (coronary artery disease)    s/p MI in 1997 tx with POBA to LCx // s/p CABG 11/2021   Carotid artery disease (HCC)    US 1/23: L 1-39   Diabetes mellitus    Drug therapy    Intermittent steroid use   Dyslipidemia    Edema    Ejection fraction    EF 60%, echo, November, 2011, trivial pericardial effusion // Echo 1/23: EF 60-65   HTN (hypertension) 12/29/2021   Rheumatoid arthritis(714.0)    Hospitalization August, 2011, severe RA flare,    Statin intolerance     Past Surgical History:  Procedure Laterality Date   ABDOMINAL HYSTERECTOMY     CHOLECYSTECTOMY     CORONARY ARTERY BYPASS GRAFT N/A 12/08/2021   Procedure: CORONARY ARTERY BYPASS GRAFTING (CABG) X 3, ON PUMP< USING LEFT INTERNAL MAMMARY ARTERY AND RIGHT ENDOSCOPIC GREATER SAPHENOUS VEIN CONDUITS;  Surgeon: Corliss SkainsLightfoot, Harrell O, MD;  Location: [facility] OR;  Service: Open  Heart Surgery;  Laterality: N/A;   ENDOVEIN HARVEST OF GREATER SAPHENOUS VEIN Right 12/08/2021   Procedure: ENDOVEIN HARVEST OF GREATER SAPHENOUS VEIN;  Surgeon: Corliss SkainsLightfoot, Harrell O, MD;  Location: [facility] OR;  Service: Open Heart Surgery;  Laterality: Right;   LEFT HEART CATH AND CORONARY ANGIOGRAPHY N/A 01/10/2019   Procedure: LEFT HEART CATH AND CORONARY ANGIOGRAPHY;  Surgeon: SwazilandJordan, Peter M, MD;  [facility];  Service: Cardiovascular;  Laterality: N/A;   LEFT HEART CATH AND CORONARY ANGIOGRAPHY N/A 12/02/2021   Procedure: LEFT HEART CATH AND CORONARY ANGIOGRAPHY;  Surgeon: Lyn RecordsSmith, Tyrena Gohr W, MD;  Location: [facility] INVAS[facility] CV [facility];  Service: Cardiovascular;  Laterality: N/A;   TEE WITHOUT CARDIOVERSION N/A 12/08/2021   Procedure: TRANSESOPHAGEAL ECHOCARDIOGRAM (TEE);  Surgeon: Corliss SkainsLightfoot, Harrell O, MD;  [facility];  Service: Open Heart Surgery;  Laterality: N/A;     Medications Prior to Admission: Prior to Admission medications   Medication Sig Start Date End Date Taking? Authorizing Provider  Abatacept 125 MG/ML SOSY Inject 1 Dose into the skin every 30 (thirty) days.   Yes [provider]  aspirin 81 MG tablet Take 1 tablet (81 mg total) by mouth daily. 07/26/13  Yes Luis AbedKatz, Jeffrey D, MD  Cholecalciferol (VITAMIN D-3) 1000 units CAPS Take 1,000 Units by mouth daily.    Yes [provider]  folic acid (FOLVITE) 1 MG tablet Take 1  mg by mouth every evening.    Yes [provider]  Insulin Lispro Prot & Lispro (HUMALOG 75/25 MIX) (75-25) 100 UNIT/ML Kwikpen Inject 3-5 Units into the skin See admin instructions. Inject 5 units in the morning and 3 units in the evening 06/28/21  Yes [provider]  losartan (COZAAR) 25 MG tablet Take 1 tablet (25 mg total) by mouth daily. 11/27/21  Yes Nahser, Deloris Ping, MD  methotrexate 250 MG/10ML injection Inject 10 mg into the muscle every Sunday. 06/29/19  Yes [provider]  Multiple Vitamin (MULTIVITAMIN  WITH MINERALS) TABS tablet Take 1 tablet by mouth daily. Centrum silver   Yes [provider]  pantoprazole (PROTONIX) 40 MG tablet Take 40 mg by mouth daily.   Yes [provider]  acetaminophen (TYLENOL) 325 MG tablet Take 2 tablets (650 mg total) by mouth every 6 (six) hours as needed for moderate pain, fever or headache. 12/16/21   Barrett, Erin R, PA-C  ALPRAZolam (XANAX) 0.25 MG tablet Take 0.25 mg by mouth daily as needed. 07/02/21   [provider]  B-D UF III MINI PEN NEEDLES 31G X 5 MM MISC USE WITH INSULIN TO INJECT BID UTD 02/14/19   [provider]  furosemide (LASIX) 40 MG tablet Take 1 tablet (40 mg total) by mouth as needed for fluid (wt gain of 3 lbs in 24 hours.). 12/30/21   Tereso Newcomer T, PA-C  metoprolol tartrate (LOPRESSOR) 25 MG tablet Take 0.5 tablets (12.5 mg total) by mouth 2 (two) times daily. 12/16/21   Barrett, Rae Roam, PA-C  ONETOUCH VERIO test strip USE 1 STRIP TID UTD 01/07/19   [provider]  potassium chloride (KLOR-CON) 10 MEQ tablet Take 1 tablet (10 mEq total) by mouth as needed (wt gain of 3 lbs in 24 hours to be taken with Lasix). 12/30/21   Tereso Newcomer T, PA-C  traMADol (ULTRAM) 50 MG tablet Take 1 tablet (50 mg total) by mouth every 6 (six) hours as needed for severe pain. 12/24/21   Ardelle Balls, PA-C     Allergies:    Allergies  Allergen Reactions   Cholesterol     All cholesterol meds cause pain and aching in joints   Hydrocodone Nausea Only   Statins     Joint pain   Metformin And Related     Joint pain    Social History:   Social History   Socioeconomic History   Marital status: Widowed    Spouse name: Not on file   Number of children: Not on file   Years of education: Not on file   Highest education level: Not on file  Occupational History   Not on file  Tobacco Use   Smoking status: Former    Types: Cigarettes    Quit date: 11/16/1994    Years since quitting: 27.1   Smokeless  tobacco: Never  Vaping Use   Vaping Use: Never used  Substance and Sexual Activity   Alcohol use: No    Alcohol/week: 0.0 standard drinks   Drug use: No   Sexual activity: Not on file  Other Topics Concern   Not on file  Social History Narrative   Not on file   Social Determinants of Health   Financial Resource Strain: Not on file  Food Insecurity: Not on file  Transportation Needs: Not on file  Physical Activity: Not on file  Stress: Not on file  Social Connections: Not on file  Intimate Partner Violence:  Not on file    Family History:   The patient's family history includes Diabetes in her mother; Hypertension in her mother; Other in her father.    ROS:  Please see the history of present illness.  DOE but lives independently. All other ROS reviewed and negative.     Physical Exam/Data:   Vitals:   12/15/21 2042 12/15/21 2300 12/16/21 0436 12/16/21 0818  BP: (!) 176/58 135/60 (!) 108/59 140/62  Pulse: 94 64 67 75  Resp: Temp: 98.4 F (36.9 C) 98.2 F (36.8 C) 98.6 F (37 C) 98.6 F (37 C)  TempSrc: Oral Oral Oral Oral  SpO2: 98% 97%  97%  Weight:      Height:       No intake or output data in the 24 hours ending 01/02/22 1715 Last 3 Weights 12/30/2021 12/15/2021 12/13/2021  Weight (lbs) 140 lb 3.2 oz 160 lb 4.4 oz 159 lb 6.3 oz  Weight (kg) 63.594 kg 72.7 kg 72.3 kg     Body mass index is 29.31 kg/m.  General:  Well nourished, well developed, in no acute distress. HEENT: normal Neck: no JVD Vascular: No carotid bruits; Distal pulses 2+ bilaterally   Cardiac:  normal S1, S2; RRR; no murmur  Lungs:  clear to auscultation bilaterally, no wheezing, rhonchi or rales  Abd: soft, nontender, no hepatomegaly  Ext: no edema Musculoskeletal:  No deformities, BUE and BLE strength normal and equal Skin: warm and dry  Neuro:  CNs 2-12 intact, no focal abnormalities noted Psych:  Normal affect    EKG:  The ECG that was not done.   Relevant CV  Studies:   Laboratory Data:  High Sensitivity Troponin:  No results for input(s): TROPONINIHS in the last 720 hours.    Chemistry Recent Labs  Lab 12/30/21 1158  NA 137  K 5.0  CL 104  CO2 22  GLUCOSE 140*  BUN 9  CREATININE 0.78  CALCIUM 9.3    No results for input(s): PROT, ALBUMIN, AST, ALT, ALKPHOS, BILITOT in the last 168 hours. Lipids No results for input(s): CHOL, TRIG, HDL, LABVLDL, LDLCALC, CHOLHDL in the last 168 hours. Hematology Recent Labs  Lab 12/30/21 1158  WBC 6.4  RBC 3.31*  HGB 10.0*  HCT 30.9*  MCV 93  MCH 30.2  MCHC 32.4  RDW 15.3  PLT 475*   Thyroid No results for input(s): TSH, FREET4 in the last 168 hours. BNPNo results for input(s): BNP, PROBNP in the last 168 hours.  DDimer No results for input(s): DDIMER in the last 168 hours.   Radiology/Studies:  No results found.   Assessment and Plan:   CAD with progressive angina pectoris referred by Dr. Elease Hashimoto for coronary angiography. Apparently Dr. Harvie Bridge note has been misplaced and this note is being created weeks after the fact. The patient was counseled to undergo left heart catheterization, coronary angiography, and possible percutaneous coronary intervention with stent implantation. The procedural risks and benefits were discussed in detail. The risks discussed included death, stroke, myocardial infarction, life-threatening bleeding, limb ischemia, kidney injury, allergy, and possible emergency cardiac surgery. The risk of these significant complications were estimated to occur less than 1% of the time. After discussion, the patient has agreed to proceed.      Risk Assessment/Risk Scores:         Severity of Illness: The appropriate patient status for this patient is INPATIENT. Inpatient status is judged to be reasonable and necessary in order to  provide the required intensity of service to ensure the patient's safety. The patient's presenting symptoms, physical exam findings, and  initial radiographic and laboratory data in the context of their chronic comorbidities is felt to place them at high risk for further clinical deterioration. Furthermore, it is not anticipated that the patient will be medically stable for discharge from the hospital within 2 midnights of admission.   * I certify that at the point of admission it is my clinical judgment that the patient will require inpatient hospital care spanning beyond 2 midnights from the point of admission due to high intensity of service, high risk for further deterioration and high frequency of surveillance required.*   For questions or updates, please contact [facility] Please consult [email or web address] for contact info under     Signed, Lesleigh Noe, MD  01/02/2022 5:15 PM

## 2022-01-07 ENCOUNTER — Ambulatory Visit: Payer: Medicare PPO

## 2022-01-28 ENCOUNTER — Other Ambulatory Visit: Payer: Self-pay | Admitting: Thoracic Surgery (Cardiothoracic Vascular Surgery)

## 2022-01-28 DIAGNOSIS — Z951 Presence of aortocoronary bypass graft: Secondary | ICD-10-CM

## 2022-01-30 ENCOUNTER — Ambulatory Visit
Admission: RE | Admit: 2022-01-30 | Discharge: 2022-01-30 | Disposition: A | Discharge status: Home / Self Care | Payer: Medicare PPO | Source: Ambulatory Visit | Attending: Thoracic Surgery (Cardiothoracic Vascular Surgery) | Admitting: Thoracic Surgery (Cardiothoracic Vascular Surgery)

## 2022-01-30 ENCOUNTER — Other Ambulatory Visit: Payer: Self-pay

## 2022-01-30 ENCOUNTER — Ambulatory Visit (INDEPENDENT_AMBULATORY_CARE_PROVIDER_SITE_OTHER): Payer: Self-pay | Admitting: Thoracic Surgery (Cardiothoracic Vascular Surgery)

## 2022-01-30 VITALS — BP 150/77 | HR 76 | Resp 18 | Ht 62.0 in | Wt 135.0 lb

## 2022-01-30 DIAGNOSIS — Z951 Presence of aortocoronary bypass graft: Secondary | ICD-10-CM

## 2022-01-30 DIAGNOSIS — J984 Other disorders of lung: Secondary | ICD-10-CM | POA: Diagnosis not present

## 2022-01-30 NOTE — Progress Notes (Signed)
? ?   ?  [address] ?      [place] [address] ?            [phone]       ? ?Truddie Hidden Fate ?[place] Record [id number] ?Date of Birth: [birth year] ? ?Referring: Belva Crome, MD ?Primary Care: Janie Morning, DO ?Primary Cardiologist:Philip Nahser, MD ? ?Reason for visit:   follow-up ? ?History of Present Illness:     ?Mrs. Nobel comes in for 1 month follow-up appointment.  Overall she is doing well.  She denies any chest pain or shortness of breath.  She is back to living independently. ? ?Physical Exam: ?BP (!) 150/77   Pulse 76   Resp 18   Ht 5\' 2"  (1.575 m)   Wt 135 lb (61.2 kg)   SpO2 99% Comment: RA  BMI 24.69 kg/m?  ? ?Alert NAD ?Incision clean.  Sternum stable ?Abdomen, ND ?No peripheral edema ? ? ?Diagnostic Studies & Laboratory data: ?CXR: Clear ? ?  ? ?Assessment / Plan:   ?81 year old female status post CABG.  Currently doing well.  Cleared for cardiac rehab.  Follow-up as needed. ? ? ?Lucile Crater Serenna Deroy ?01/30/2022 11:45 AM ? ? ? ? ? ? ?

## 2022-02-02 ENCOUNTER — Other Ambulatory Visit: Payer: Self-pay

## 2022-02-02 NOTE — Progress Notes (Signed)
Referral placed per Dr. Cliffton Asters for outpatient Cardiac Rehab. ?

## 2022-02-03 DIAGNOSIS — M0589 Other rheumatoid arthritis with rheumatoid factor of multiple sites: Secondary | ICD-10-CM | POA: Diagnosis not present

## 2022-02-03 DIAGNOSIS — Z79899 Other long term (current) drug therapy: Secondary | ICD-10-CM | POA: Diagnosis not present

## 2022-02-03 DIAGNOSIS — M170 Bilateral primary osteoarthritis of knee: Secondary | ICD-10-CM | POA: Diagnosis not present

## 2022-02-03 DIAGNOSIS — M810 Age-related osteoporosis without current pathological fracture: Secondary | ICD-10-CM | POA: Diagnosis not present

## 2022-02-03 DIAGNOSIS — I2510 Atherosclerotic heart disease of native coronary artery without angina pectoris: Secondary | ICD-10-CM | POA: Diagnosis not present

## 2022-02-03 DIAGNOSIS — E119 Type 2 diabetes mellitus without complications: Secondary | ICD-10-CM | POA: Diagnosis not present

## 2022-02-03 DIAGNOSIS — E785 Hyperlipidemia, unspecified: Secondary | ICD-10-CM | POA: Diagnosis not present

## 2022-02-03 DIAGNOSIS — I10 Essential (primary) hypertension: Secondary | ICD-10-CM | POA: Diagnosis not present

## 2022-02-10 DIAGNOSIS — Z09 Encounter for follow-up examination after completed treatment for conditions other than malignant neoplasm: Secondary | ICD-10-CM | POA: Diagnosis not present

## 2022-02-10 DIAGNOSIS — E785 Hyperlipidemia, unspecified: Secondary | ICD-10-CM | POA: Diagnosis not present

## 2022-02-10 DIAGNOSIS — Z951 Presence of aortocoronary bypass graft: Secondary | ICD-10-CM | POA: Diagnosis not present

## 2022-02-10 DIAGNOSIS — E1159 Type 2 diabetes mellitus with other circulatory complications: Secondary | ICD-10-CM | POA: Diagnosis not present

## 2022-02-10 DIAGNOSIS — F419 Anxiety disorder, unspecified: Secondary | ICD-10-CM | POA: Diagnosis not present

## 2022-02-10 DIAGNOSIS — Z789 Other specified health status: Secondary | ICD-10-CM | POA: Diagnosis not present

## 2022-02-10 DIAGNOSIS — I2510 Atherosclerotic heart disease of native coronary artery without angina pectoris: Secondary | ICD-10-CM | POA: Diagnosis not present

## 2022-03-04 ENCOUNTER — Ambulatory Visit: Payer: Medicare PPO | Admitting: Physician Assistant

## 2022-03-04 DIAGNOSIS — M0589 Other rheumatoid arthritis with rheumatoid factor of multiple sites: Secondary | ICD-10-CM | POA: Diagnosis not present

## 2022-03-18 NOTE — Progress Notes (Signed)
?Cardiology Office Note:   ? ?Date:  03/19/2022  ? ?ID:  [name] [name], DOB 26-Sep-1941, MRN [id number] ? ?PCP:  Janie Morning, DO ?  ?[facility] Providers ?Cardiologist:  Mertie Moores, MD ?Cardiology APP:  Liliane Shi, PA-C    ? ?Referring MD: Janie Morning, DO  ? ?Chief Complaint: 3 month follow-up CAD s/p CABG ? ?History of Present Illness:   ? ?[name] [name] is a very pleasant 81 y.o. female with a hx of: ? ?Coronary artery disease  ?S/p MI 06/1996 tx with POBA to LCx ?Cath 12/2018: dLAD 80, pRCA 85 (small) >> med Rx; PCI of dLAD only if refractory angina ?Intol of Toprol (arthralgias) >> changed to Imdur ?S/p CABG in 1/23 (LIMA-LAD, SVG-OM, SVG-PL) ?Diabetes mellitus ?Rheumatoid arthritis  ?Hypertension  ?Carotid artery Dz ?[place] 2011: 0-39% bilat ICA  ?Hyperlipidemia ?Statin intolerant; intol of Zetia  ?Intol of Evolocumab (needle phobia) ?Bempedoic acid  ? ?Has been followed by cardiology for many years for management of CAD. Seen in our office by Dr. Acie Fredrickson on 11/27/2021 with symptoms of progressive angina. She underwent cardiac catheterization which demonstrated three-vessel CAD.  Admission 1/17 to 1/30 and underwent CABG with Dr. Kipp Brood (LIMA to LAD, SVG to OM, SVG to PL) and endoscopic vein harvesting.  Post CABG notable for anemia and required platelet/PRBC transfusion, volume overload and required diuresis and oral thrush.  Cardiac rehab was recommended but the patient opted to go home with home health.  She has been referred to our Pharm.D. lipid clinic for evaluation of alternative lipid management, however there is no record of that appointment. ? ?She was last seen on 12/30/2021 by Richardson Dopp, PA at which time it was noted she was taking aspirin 81 mg once daily.  She was advised to take 325 mg once daily for 3 months then resume low-dose aspirin. ? ?Today, she is here with daughter.  She reports she has been feeling irregular heartbeats that feel like her heart flip-flopping. Felt  particularly bad 6 days ago, feeling her heart beating fast "thought I was going to leave here." In review, she believes that it may have been related to drinking very little water and not eating very much food on that day.  She also took a nitroglycerin, unclear as to whether this caused improvement. She felt better at the end of the day. Was prescribed prednisone for right knee pain by rheumatology, feels symptoms of palpitations coincide with starting this.  Also notes palpitations when she gets up to go to the bathroom during the night. Has bilateral lower extremity edema as well as swelling to the lateral aspect of RLE. No additional chest discomfort (pain, tightness, pressure) other than described above. She denies shortness of breath, lightheadedness, presyncope, syncope, orthopnea, PND. No bleeding concerns. Weight is stable, however she does not monitor daily. Takes Lasix when she feels swelling 1-2 x per week. Does not monitor BP on a consistent basis at home, no high BP readings. Recently saw PCP and Imdur 60 mg was added back to her medication regimen. She denies bleeding problems on aspirin.  ? ?Past Medical History:  ?Diagnosis Date  ? CAD (coronary artery disease)   ? s/p MI in 1997 tx with POBA to LCx // s/p CABG 11/2021  ? Carotid artery disease ([place])   ? [place] 1/23: L 1-39  ? Diabetes mellitus   ? Drug therapy   ? Intermittent steroid use  ? Dyslipidemia   ? Edema   ? Ejection fraction   ?  EF 60%, echo, November, 2011, trivial pericardial effusion // Echo 1/23: EF 60-65  ? HTN (hypertension) 12/29/2021  ? Rheumatoid arthritis(714.0)   ? Hospitalization August, 2011, severe RA flare,   ? Statin intolerance   ? ? ?Past Surgical History:  ?Procedure Laterality Date  ? ABDOMINAL HYSTERECTOMY    ? CHOLECYSTECTOMY    ? CORONARY ARTERY BYPASS GRAFT N/A 12/08/2021  ? Procedure: CORONARY ARTERY BYPASS GRAFTING (CABG) X 3, ON PUMP< USING LEFT INTERNAL MAMMARY ARTERY AND RIGHT ENDOSCOPIC GREATER SAPHENOUS VEIN  CONDUITS;  Surgeon: Lajuana Matte, MD;  Location: [facility];  Service: Open Heart Surgery;  Laterality: N/A;  ? ENDOVEIN HARVEST OF GREATER SAPHENOUS VEIN Right 12/08/2021  ? Procedure: ENDOVEIN HARVEST OF GREATER SAPHENOUS VEIN;  Surgeon: Lajuana Matte, MD;  Location: [facility];  Service: Open Heart Surgery;  Laterality: Right;  ? LEFT HEART CATH AND CORONARY ANGIOGRAPHY N/A 01/10/2019  ? Procedure: LEFT HEART CATH AND CORONARY ANGIOGRAPHY;  Surgeon: Martinique, Peter M, MD;  Location: [facility] CV LAB;  Service: Cardiovascular;  Laterality: N/A;  ? LEFT HEART CATH AND CORONARY ANGIOGRAPHY N/A 12/02/2021  ? Procedure: LEFT HEART CATH AND CORONARY ANGIOGRAPHY;  Surgeon: Belva Crome, MD;  Location: [facility] CV LAB;  Service: Cardiovascular;  Laterality: N/A;  ? TEE WITHOUT CARDIOVERSION N/A 12/08/2021  ? Procedure: TRANSESOPHAGEAL ECHOCARDIOGRAM (TEE);  Surgeon: Lajuana Matte, MD;  Location: [facility];  Service: Open Heart Surgery;  Laterality: N/A;  ? ? ?Current Medications: ?Current Meds  ?Medication Sig  ? Abatacept 125 MG/ML SOSY Inject 1 Dose into the skin every 30 (thirty) days.  ? acetaminophen (TYLENOL) 325 MG tablet Take 2 tablets (650 mg total) by mouth every 6 (six) hours as needed for moderate pain, fever or headache.  ? ALPRAZolam (XANAX) 0.25 MG tablet Take 0.25 mg by mouth daily as needed.  ? aspirin 81 MG tablet Take 1 tablet (81 mg total) by mouth daily.  ? Cholecalciferol (VITAMIN D-3) 1000 units CAPS Take 1,000 Units by mouth daily.   ? folic acid (FOLVITE) 1 MG tablet Take 1 mg by mouth every evening.   ? furosemide (LASIX) 40 MG tablet Take 1 tablet (40 mg total) by mouth as needed for fluid (wt gain of 3 lbs in 24 hours.).  ? Insulin Lispro Prot & Lispro (HUMALOG 75/25 MIX) (75-25) 100 UNIT/ML Kwikpen Inject 3-5 Units into the skin See admin instructions. Inject 5 units in the morning and 3 units in the evening  ? isosorbide mononitrate (IMDUR) 30 MG 24 hr tablet Take 2 tablets (60 mg  total) by mouth daily.  ? losartan (COZAAR) 25 MG tablet Take 1 tablet (25 mg total) by mouth daily.  ? methotrexate 250 MG/10ML injection Inject 10 mg into the muscle every Sunday.  ? Multiple Vitamin (MULTIVITAMIN WITH MINERALS) TABS tablet Take 1 tablet by mouth daily. Centrum silver  ? pantoprazole (PROTONIX) 40 MG tablet Take 40 mg by mouth daily.  ? potassium chloride (KLOR-CON) 10 MEQ tablet Take 1 tablet (10 mEq total) by mouth as needed (wt gain of 3 lbs in 24 hours to be taken with Lasix).  ? [START ON 03/20/2022] rosuvastatin (CRESTOR) 5 MG tablet Take 1 tablet (5 mg total) by mouth 3 (three) times a week.  ? traMADol (ULTRAM) 50 MG tablet Take 1 tablet (50 mg total) by mouth every 6 (six) hours as needed for severe pain.  ? [DISCONTINUED] metoprolol tartrate (LOPRESSOR) 25 MG tablet Take 0.5 tablets (12.5 mg total)  by mouth 2 (two) times daily.  ?  ? ?Allergies:   Cholesterol, Hydrocodone, Statins, and Metformin and related  ? ?Social History  ? ?Socioeconomic History  ? Marital status: Widowed  ?  Spouse name: Not on file  ? Number of children: Not on file  ? Years of education: Not on file  ? Highest education level: Not on file  ?Occupational History  ? Not on file  ?Tobacco Use  ? Smoking status: Former  ?  Types: Cigarettes  ?  Quit date: 11/16/1994  ?  Years since quitting: 27.3  ? Smokeless tobacco: Never  ?Vaping Use  ? Vaping Use: Never used  ?Substance and Sexual Activity  ? Alcohol use: No  ?  Alcohol/week: 0.0 standard drinks  ? Drug use: No  ? Sexual activity: Not on file  ?Other Topics Concern  ? Not on file  ?Social History Narrative  ? Not on file  ? ?Social Determinants of Health  ? ?Financial Resource Strain: Not on file  ?Food Insecurity: Not on file  ?Transportation Needs: Not on file  ?Physical Activity: Not on file  ?Stress: Not on file  ?Social Connections: Not on file  ?  ? ?Family History: ?The patient's family history includes Diabetes in her mother; Hypertension in her mother;  Other in her father. ? ?ROS:   ?Please see the history of present illness.    ?+ palpitations ?+ bilateral LE edema ?+ swelling lateral RLE ?All other systems reviewed and are negative. ? ?Labs/Other Studies Reviewed:

## 2022-03-19 ENCOUNTER — Ambulatory Visit: Payer: Medicare PPO | Admitting: Nurse Practitioner

## 2022-03-19 ENCOUNTER — Ambulatory Visit (INDEPENDENT_AMBULATORY_CARE_PROVIDER_SITE_OTHER): Payer: Medicare PPO

## 2022-03-19 ENCOUNTER — Encounter: Payer: Self-pay | Admitting: Nurse Practitioner

## 2022-03-19 ENCOUNTER — Telehealth: Payer: Self-pay | Admitting: *Deleted

## 2022-03-19 VITALS — BP 136/74 | HR 69 | Ht 62.0 in | Wt 136.0 lb

## 2022-03-19 DIAGNOSIS — R002 Palpitations: Secondary | ICD-10-CM

## 2022-03-19 DIAGNOSIS — I498 Other specified cardiac arrhythmias: Secondary | ICD-10-CM

## 2022-03-19 DIAGNOSIS — Z951 Presence of aortocoronary bypass graft: Secondary | ICD-10-CM | POA: Diagnosis not present

## 2022-03-19 DIAGNOSIS — R600 Localized edema: Secondary | ICD-10-CM

## 2022-03-19 DIAGNOSIS — I2510 Atherosclerotic heart disease of native coronary artery without angina pectoris: Secondary | ICD-10-CM

## 2022-03-19 DIAGNOSIS — I10 Essential (primary) hypertension: Secondary | ICD-10-CM

## 2022-03-19 DIAGNOSIS — E785 Hyperlipidemia, unspecified: Secondary | ICD-10-CM | POA: Diagnosis not present

## 2022-03-19 DIAGNOSIS — I493 Ventricular premature depolarization: Secondary | ICD-10-CM

## 2022-03-19 LAB — BASIC METABOLIC PANEL
BUN/Creatinine Ratio: 16 (ref 12–28)
BUN: 14 mg/dL (ref 8–27)
CO2: 27 mmol/L (ref 20–29)
Calcium: 9.4 mg/dL (ref 8.7–10.3)
Chloride: 102 mmol/L (ref 96–106)
Creatinine, Ser: 0.88 mg/dL (ref 0.57–1.00)
Glucose: 155 mg/dL — ABNORMAL HIGH (ref 70–99)
Potassium: 4.8 mmol/L (ref 3.5–5.2)
Sodium: 137 mmol/L (ref 134–144)
eGFR: 66 mL/min/{1.73_m2} (ref >59)

## 2022-03-19 LAB — CBC
Hematocrit: 33.6 % — ABNORMAL LOW (ref 34.0–46.6)
Hemoglobin: 11.2 g/dL (ref 11.1–15.9)
MCH: 29 pg (ref 26.6–33.0)
MCHC: 33.3 g/dL (ref 31.5–35.7)
MCV: 87 fL (ref 79–97)
Platelets: 269 10*3/uL (ref 150–450)
RBC: 3.86 x10E6/uL (ref 3.77–5.28)
RDW: 14.7 % (ref 11.7–15.4)
WBC: 7.4 10*3/uL (ref 3.4–10.8)

## 2022-03-19 LAB — TSH: TSH: 1.6 u[IU]/mL (ref 0.450–4.500)

## 2022-03-19 LAB — MAGNESIUM: Magnesium: 2 mg/dL (ref 1.6–2.3)

## 2022-03-19 MED ORDER — ISOSORBIDE MONONITRATE ER 30 MG PO TB24: 60.0000 mg | ORAL_TABLET | Freq: Every day | ORAL | 3 refills | Status: DC

## 2022-03-19 MED ORDER — ROSUVASTATIN CALCIUM 5 MG PO TABS: 5.0000 mg | ORAL_TABLET | ORAL | 11 refills | Status: DC

## 2022-03-19 MED ORDER — METOPROLOL TARTRATE 25 MG PO TABS: 25.0000 mg | ORAL_TABLET | Freq: Two times a day (BID) | ORAL | 3 refills | Status: DC

## 2022-03-19 NOTE — Telephone Encounter (Signed)
S/w pharmacy @ [phone] to clarify imdur two tablets ( 60 mg) daily.  ?

## 2022-03-19 NOTE — Progress Notes (Unsigned)
ZIO XT serial # [id number] from office inventory applied to patient. ? ?Dr. Elease Hashimoto to read. ?

## 2022-03-19 NOTE — Patient Instructions (Signed)
Medication Instructions:  ? ? ?INCREASE Lopressor one (1) tablet by mouth ( 25 mg ) twice daily. ? ?START Rosuvastatin one (1) tablet by mouth ( 5 mg ) three times weekly.  ? ?*If you need a refill on your cardiac medications before your next appointment, please call your pharmacy* ? ? ?Lab Work: ? ?TODAY!!!! TSH/BMET/MAG/CBC ? ?If you have labs (blood work) drawn today and your tests are completely normal, you will receive your results only by: ?MyChart Message (if you have MyChart) OR ?A paper copy in the mail ?If you have any lab test that is abnormal or we need to change your treatment, we will call you to review the results. ? ? ?Testing/Procedures: ? ?Your physician has requested that you have a lower or upper extremity venous duplex. This test is an ultrasound of the veins in the legs or arms. It looks at venous blood flow that carries blood from the heart to the legs or arms. Allow one hour for a Lower Venous exam. Allow thirty minutes for an Upper Venous exam. There are no restrictions or special instructions. ? ?ZIO XT- Long Term Monitor Instructions ? ?Your physician has requested you wear a ZIO patch monitor for 14 days.  ?This is a single patch monitor. [employer] supplies one patch monitor per enrollment. Additional ?stickers are not available. Please do not apply patch if you will be having a Nuclear Stress Test,  ?Echocardiogram, Cardiac CT, MRI, or Chest Xray during the period you would be wearing the  ?monitor. The patch cannot be worn during these tests. You cannot remove and re-apply the  ?ZIO XT patch monitor.  ?Your ZIO patch monitor will be mailed 3 day USPS to your address on file. It may take 3-5 days  ?to receive your monitor after you have been enrolled.  ?Once you have received your monitor, please review the enclosed instructions. Your monitor  ?has already been registered assigning a specific monitor serial # to you. ? ?Billing and Patient Assistance Program Information ? ?We have supplied  [employer] with any of your insurance information on file for billing purposes. ?[employer] offers a sliding scale Patient Assistance Program for patients that do not have  ?insurance, or whose insurance does not completely cover the cost of the ZIO monitor.  ?You must apply for the Patient Assistance Program to qualify for this discounted rate.  ?To apply, please call [employer] at [phone], select option 4, select option 2, ask to apply for  ?Patient Assistance Program. Theodore Demark will ask your household income, and how many people  ?are in your household. They will quote your out-of-pocket cost based on that information.  ?[employer] will also be able to set up a 57-month, interest-free payment plan if needed. ? ?Applying the monitor ?  ?Shave hair from upper left chest.  ?Hold abrader disc by orange tab. Rub abrader in 40 strokes over the upper left chest as  ?indicated in your monitor instructions.  ?Clean area with 4 enclosed alcohol pads. Let dry.  ?Apply patch as indicated in monitor instructions. Patch will be placed under collarbone on left  ?side of chest with arrow pointing upward.  ?Rub patch adhesive wings for 2 minutes. Remove white label marked "1". Remove the white  ?label marked "2". Rub patch adhesive wings for 2 additional minutes.  ?While looking in a mirror, press and release button in center of patch. A small green light will  ?flash 3-4 times. This will be your only indicator that the monitor has  been turned on.  ?Do not shower for the first 24 hours. You may shower after the first 24 hours.  ?Press the button if you feel a symptom. You will hear a small click. Record Date, Time and  ?Symptom in the Patient Logbook.  ?When you are ready to remove the patch, follow instructions on the last 2 pages of Patient  ?Logbook. Stick patch monitor onto the last page of Patient Logbook.  ?Place Patient Logbook in the blue and white box. Use locking tab on box and tape box closed  ?securely. The blue and white box  has prepaid postage on it. Please place it in the mailbox as  ?soon as possible. Your physician should have your test results approximately 7 days after the  ?monitor has been mailed back to [facility].  ?Call [facility] at [phone] if you have questions regarding  ?your ZIO XT patch monitor. Call them immediately if you see an orange light blinking on your  ?monitor.  ?If your monitor falls off in less than 4 days, contact our Monitor department at [phone].  ?If your monitor becomes loose or falls off after 4 days call [employer] at [phone] for  ?suggestions on securing your monitor ? ? ?Follow-Up: ?At [facility], you and your health needs are our priority.  As part of our continuing mission to provide you with exceptional heart care, we have created designated Provider Care Teams.  These Care Teams include your primary Cardiologist (physician) and Advanced Practice Providers (APPs -  Physician Assistants and Nurse Practitioners) who all work together to provide you with the care you need, when you need it. ? ?We recommend signing up for the patient portal called "MyChart".  Sign up information is provided on this After Visit Summary.  MyChart is used to connect with patients for Virtual Visits (Telemedicine).  Patients are able to view lab/test results, encounter notes, upcoming appointments, etc.  Non-urgent messages can be sent to your provider as well.   ?To learn more about what you can do with MyChart, go to [email or web address].   ? ?Your next appointment:   ?3 week(s) ? ?The format for your next appointment:   ?In Person ? ?Provider:   ?Christen Bame, NP       ? ? ?Other Instructions ? ?Recommend weighing daily and keeping a log. Please call our office if you have weight gain of 2 pounds overnight or 5 pounds in 1 week.  ? ?Date ? Time Weight  ? ?    ? ?    ? ?    ? ?    ? ?    ? ?    ? ?    ? ?    ?  ? ?Important Information About Sugar ? ? ? ? ?  ?

## 2022-03-20 ENCOUNTER — Telehealth (HOSPITAL_COMMUNITY): Payer: Self-pay

## 2022-03-20 NOTE — Telephone Encounter (Signed)
Called and spoke with pt in regards to CR, pt stated she is not interested and will work out at home. ?  ?Closed referral ?

## 2022-03-27 ENCOUNTER — Ambulatory Visit (HOSPITAL_COMMUNITY)
Admission: RE | Admit: 2022-03-27 | Discharge: 2022-03-27 | Disposition: A | Discharge status: Home / Self Care | Payer: Medicare PPO | Source: Ambulatory Visit | Attending: Cardiology | Admitting: Cardiology

## 2022-03-27 DIAGNOSIS — R600 Localized edema: Secondary | ICD-10-CM | POA: Diagnosis not present

## 2022-03-27 DIAGNOSIS — M79604 Pain in right leg: Secondary | ICD-10-CM | POA: Diagnosis not present

## 2022-03-27 DIAGNOSIS — I2510 Atherosclerotic heart disease of native coronary artery without angina pectoris: Secondary | ICD-10-CM

## 2022-03-27 DIAGNOSIS — E785 Hyperlipidemia, unspecified: Secondary | ICD-10-CM

## 2022-03-27 DIAGNOSIS — Z951 Presence of aortocoronary bypass graft: Secondary | ICD-10-CM | POA: Diagnosis not present

## 2022-04-02 ENCOUNTER — Other Ambulatory Visit: Payer: Self-pay | Admitting: Physician Assistant

## 2022-04-02 DIAGNOSIS — I10 Essential (primary) hypertension: Secondary | ICD-10-CM | POA: Diagnosis not present

## 2022-04-02 DIAGNOSIS — I2510 Atherosclerotic heart disease of native coronary artery without angina pectoris: Secondary | ICD-10-CM | POA: Diagnosis not present

## 2022-04-02 DIAGNOSIS — M0589 Other rheumatoid arthritis with rheumatoid factor of multiple sites: Secondary | ICD-10-CM | POA: Diagnosis not present

## 2022-04-02 DIAGNOSIS — E785 Hyperlipidemia, unspecified: Secondary | ICD-10-CM | POA: Diagnosis not present

## 2022-04-02 DIAGNOSIS — Z79899 Other long term (current) drug therapy: Secondary | ICD-10-CM | POA: Diagnosis not present

## 2022-04-02 DIAGNOSIS — M25461 Effusion, right knee: Secondary | ICD-10-CM | POA: Diagnosis not present

## 2022-04-02 DIAGNOSIS — M7989 Other specified soft tissue disorders: Secondary | ICD-10-CM | POA: Diagnosis not present

## 2022-04-02 DIAGNOSIS — M810 Age-related osteoporosis without current pathological fracture: Secondary | ICD-10-CM | POA: Diagnosis not present

## 2022-04-02 DIAGNOSIS — M170 Bilateral primary osteoarthritis of knee: Secondary | ICD-10-CM | POA: Diagnosis not present

## 2022-04-03 DIAGNOSIS — R002 Palpitations: Secondary | ICD-10-CM | POA: Diagnosis not present

## 2022-04-03 DIAGNOSIS — Z951 Presence of aortocoronary bypass graft: Secondary | ICD-10-CM | POA: Diagnosis not present

## 2022-04-03 DIAGNOSIS — R600 Localized edema: Secondary | ICD-10-CM | POA: Diagnosis not present

## 2022-04-08 DIAGNOSIS — Z951 Presence of aortocoronary bypass graft: Secondary | ICD-10-CM | POA: Diagnosis not present

## 2022-04-08 DIAGNOSIS — I2510 Atherosclerotic heart disease of native coronary artery without angina pectoris: Secondary | ICD-10-CM | POA: Diagnosis not present

## 2022-04-08 DIAGNOSIS — R2241 Localized swelling, mass and lump, right lower limb: Secondary | ICD-10-CM | POA: Diagnosis not present

## 2022-04-08 DIAGNOSIS — R229 Localized swelling, mass and lump, unspecified: Secondary | ICD-10-CM | POA: Diagnosis not present

## 2022-04-08 DIAGNOSIS — M0589 Other rheumatoid arthritis with rheumatoid factor of multiple sites: Secondary | ICD-10-CM | POA: Diagnosis not present

## 2022-04-08 DIAGNOSIS — E785 Hyperlipidemia, unspecified: Secondary | ICD-10-CM | POA: Diagnosis not present

## 2022-04-08 NOTE — Progress Notes (Signed)
Cardiology Office Note:    Date:  04/10/2022   ID:  ROLAND BLADOW, DOB 1941/07/05, MRN [id number]  PCP:  Janie Morning, DO   [facility] Cardiologist:  Mertie Moores, MD Cardiology APP:  Sharmon Revere     Referring MD: Janie Morning, DO   Chief Complaint: follow-up palpitations  History of Present Illness:    [name] [name] is a very pleasant 81 y.o. female with a hx of:  Coronary artery disease  S/p MI 06/1996 tx with POBA to LCx Cath 12/2018: dLAD 80, pRCA 85 (small) >> med Rx; PCI of dLAD only if refractory angina Intol of Toprol (arthralgias) >> changed to Imdur S/p CABG in 1/23 (LIMA-LAD, SVG-OM, SVG-PL) Diabetes mellitus Rheumatoid arthritis  Hypertension  Carotid artery Dz [place] 2011: 0-39% bilat ICA  Hyperlipidemia Statin intolerant; intol of Zetia  Intol of Evolocumab (needle phobia) Bempedoic acid   Has been followed by cardiology for many years for management of CAD. Seen in our office by Dr. Acie Fredrickson on 11/27/2021 with symptoms of progressive angina. She underwent cardiac catheterization which demonstrated three-vessel CAD.  Admission 1/17 to 1/30 and underwent CABG with Dr. Kipp Brood (LIMA to LAD, SVG to OM, SVG to PL) and endoscopic vein harvesting.  Post CABG notable for anemia and required platelet/PRBC transfusion, volume overload and required diuresis and oral thrush.  Cardiac rehab was recommended but the patient opted to go home with home health.  She has been referred to our Pharm.D. lipid clinic for evaluation of alternative lipid management, however there is no record of that appointment.  At office visit on 12/30/2021 with Richardson Dopp, PA she reported taking aspirin 81 mg once daily.  She was advised to take 325 mg once daily for 3 months then resume low-dose aspirin.  She was last seen in our office by me on 03/19/22, seen with her daughter. Reported feeling irregular heartbeats that feel like her heart flip-flopping. Felt particularly bad  6 days ago, feeling her heart beating fast "thought I was going to leave here." In review, she believes that it may have been related to drinking very little water and not eating very much food that day.  She also took a nitroglycerin, unclear as to whether this caused improvement. She felt better at the end of the day. Was prescribed prednisone for right knee pain by rheumatology, feels symptoms of palpitations coincide with starting this.  Also notes palpitations when she gets up to go to the bathroom during the night. Has bilateral lower extremity edema as well as swelling to the lateral aspect of RLE. Weight stable, however she does not monitor daily. Takes Lasix when she feels swelling 1-2 x per week. Does not monitor BP on a consistent basis at home, no high BP readings. Recently saw PCP and Imdur 60 mg was added back to her medication regimen. She denies bleeding problems on aspirin. DVT ultrasound was ordered for swelling of RLE, it was negative. Cardiac monitor showed predominant sinus rhythm with range 47 to 114 bpm, average HR 73 bpm, rare PVCs PACs, short burst of SVT, fastest 17 beats at 158 bpm.  Triggered event correlated with sinus rhythm.   Today, she is here with her daughter. She reports she is feeling better since her last office visit. There is a medication she could not get from her pharmacy, thinks we stopped it. Thinks she feels better since stopping it. Her rheumatologist emphasized the importance of taking Lasix daily and this has also helped  her, no longer has lower extremity swelling and knee pain has improved. Feels an occasional "twinge" in her left chest like an electrical shock. She denies shortness of breath, lower extremity edema, fatigue, palpitations, melena, hematuria, hemoptysis, diaphoresis, weakness, presyncope, syncope, orthopnea, and PND.  Past Medical History:  Diagnosis Date   CAD (coronary artery disease)    s/p MI in 1997 tx with POBA to LCx // s/p CABG 11/2021    Carotid artery disease (HCC)    [place] 1/23: L 1-39   Diabetes mellitus    Drug therapy    Intermittent steroid use   Dyslipidemia    Edema    Ejection fraction    EF 60%, echo, November, 2011, trivial pericardial effusion // Echo 1/23: EF 60-65   HTN (hypertension) 12/29/2021   Rheumatoid arthritis(714.0)    Hospitalization August, 2011, severe RA flare,    Statin intolerance     Past Surgical History:  Procedure Laterality Date   ABDOMINAL HYSTERECTOMY     CHOLECYSTECTOMY     CORONARY ARTERY BYPASS GRAFT N/A 12/08/2021   Procedure: CORONARY ARTERY BYPASS GRAFTING (CABG) X 3, ON PUMP< USING LEFT INTERNAL MAMMARY ARTERY AND RIGHT ENDOSCOPIC GREATER SAPHENOUS VEIN CONDUITS;  Surgeon: Corliss Skains, MD;  Location: [facility] OR;  Service: Open Heart Surgery;  Laterality: N/A;   ENDOVEIN HARVEST OF GREATER SAPHENOUS VEIN Right 12/08/2021   Procedure: ENDOVEIN HARVEST OF GREATER SAPHENOUS VEIN;  Surgeon: Corliss Skains, MD;  Location: [facility] OR;  Service: Open Heart Surgery;  Laterality: Right;   LEFT HEART CATH AND CORONARY ANGIOGRAPHY N/A 01/10/2019   Procedure: LEFT HEART CATH AND CORONARY ANGIOGRAPHY;  Surgeon: [place], Peter M, MD;  Location: [facility] INVASIVE CV LAB;  Service: Cardiovascular;  Laterality: N/A;   LEFT HEART CATH AND CORONARY ANGIOGRAPHY N/A 12/02/2021   Procedure: LEFT HEART CATH AND CORONARY ANGIOGRAPHY;  Surgeon: Lyn Records, MD;  Location: [facility] INVASIVE CV LAB;  Service: Cardiovascular;  Laterality: N/A;   TEE WITHOUT CARDIOVERSION N/A 12/08/2021   Procedure: TRANSESOPHAGEAL ECHOCARDIOGRAM (TEE);  Surgeon: Corliss Skains, MD;  Location: [facility] OR;  Service: Open Heart Surgery;  Laterality: N/A;    Current Medications: Current Meds  Medication Sig   Abatacept 125 MG/ML SOSY Inject 1 Dose into the skin every 30 (thirty) days.   acetaminophen (TYLENOL) 325 MG tablet Take 2 tablets (650 mg total) by mouth every 6 (six) hours as needed for moderate pain, fever or headache.    ALPRAZolam (XANAX) 0.25 MG tablet Take 0.25 mg by mouth daily as needed.   aspirin 81 MG tablet Take 1 tablet (81 mg total) by mouth daily.   B-D UF III MINI PEN NEEDLES 31G X 5 MM MISC USE WITH INSULIN TO INJECT BID UTD   Cholecalciferol (VITAMIN D-3) 1000 units CAPS Take 1,000 Units by mouth daily.    folic acid (FOLVITE) 1 MG tablet Take 1 mg by mouth every evening.    furosemide (LASIX) 40 MG tablet Take 1 tablet (40 mg total) by mouth as needed for fluid (wt gain of 3 lbs in 24 hours.).   Insulin Lispro Prot & Lispro (HUMALOG 75/25 MIX) (75-25) 100 UNIT/ML Kwikpen Inject 3-5 Units into the skin See admin instructions. Inject 5 units in the morning and 3 units in the evening   isosorbide mononitrate (IMDUR) 30 MG 24 hr tablet Take 2 tablets (60 mg total) by mouth daily.   losartan (COZAAR) 25 MG tablet Take 1 tablet (25 mg total) by mouth daily.  methotrexate 250 MG/10ML injection Inject 10 mg into the muscle every Sunday.   Multiple Vitamin (MULTIVITAMIN WITH MINERALS) TABS tablet Take 1 tablet by mouth daily. Centrum silver   ONETOUCH VERIO test strip USE 1 STRIP TID UTD   pantoprazole (PROTONIX) 40 MG tablet Take 40 mg by mouth daily.   potassium chloride (KLOR-CON) 10 MEQ tablet Take 1 tablet (10 mEq total) by mouth as needed (wt gain of 3 lbs in 24 hours to be taken with Lasix).   traMADol (ULTRAM) 50 MG tablet Take 1 tablet (50 mg total) by mouth every 6 (six) hours as needed for severe pain.   [DISCONTINUED] metoprolol tartrate (LOPRESSOR) 25 MG tablet Take 1 tablet (25 mg total) by mouth 2 (two) times daily.     Allergies:   Cholesterol, Hydrocodone, Statins, and Metformin and related   Social History   Socioeconomic History   Marital status: Widowed    Spouse name: Not on file   Number of children: Not on file   Years of education: Not on file   Highest education level: Not on file  Occupational History   Not on file  Tobacco Use   Smoking status: Former    Types:  Cigarettes    Quit date: 11/16/1994    Years since quitting: 27.4   Smokeless tobacco: Never  Vaping Use   Vaping Use: Never used  Substance and Sexual Activity   Alcohol use: No    Alcohol/week: 0.0 standard drinks   Drug use: No   Sexual activity: Not on file  Other Topics Concern   Not on file  Social History Narrative   Not on file   Social Determinants of Health   Financial Resource Strain: Not on file  Food Insecurity: Not on file  Transportation Needs: Not on file  Physical Activity: Not on file  Stress: Not on file  Social Connections: Not on file     Family History: The patient's family history includes Diabetes in her mother; Hypertension in her mother; Other in her father.  ROS:   Please see the history of present illness.  All other systems reviewed and are negative.  Labs/Other Studies Reviewed:    The following studies were reviewed today:  Cardiac monitor 03/19/22  Predominent rhythm:  sinus rhythm     HR  47 to 114 bpm   Average HR 73 bpm    Heart rates overall ranged from 47 to 153 bpm Rare PVCs, PACs.   SHort burst of SVT   Fastest was 17 beats at a maximal rate of 158 bpm     Not sensed Triggered event correlate with SR   Patient had a min HR of 47 bpm, max HR of 158 bpm, and avg HR of 73 bpm. Predominant underlying rhythm was Sinus Rhythm. 6 Supraventricular Tachycardia runs occurred, the run with the fastest interval lasting 17 beats with a max rate of 158 bpm (avg 143  bpm); the run with the fastest interval was also the longest. Isolated SVEs were rare (<1.0%), SVE Couplets were rare (<1.0%), and SVE Triplets were rare (<1.0%). Isolated VEs were rare (<1.0%), VE Couplets were rare (<1.0%), and no VE Triplets were  present. Ventricular Bigeminy and Trigeminy were present.    Prior CV Studies: VAS US DVT 03/30/22 No evidence of DVT, no indirect evidence of obstruction  No cystic structure found in popliteal fossa Interstitial fluid in right  calf   Echocardiogram 12/02/21 EF 60-65, no RWMA, normal RVSF, RVSF 23.5, trivial  MR   Cardiac catheterization 12/02/21 dLM 50 oLAD 95, mid 80, apical 95 pLCx 30-50 pRCA 90-95; Lg bifurcating AM 95   Pre-CABG dopplers [id number] LICA [id number]  Cardaic catheterization 01/10/19 dLAD 80, progression from previous cath Segmental pRCA 85 RCA small, not ideal for PCI Stable disease compared to 2004 cath   Recent Labs: 12/06/2021: ALT 25 03/19/2022: BUN 14; Creatinine, Ser 0.88; Hemoglobin 11.2; Magnesium 2.0; Platelets 269; Potassium 4.8; Sodium 137; TSH 1.600  Recent Lipid Panel    Component Value Date/Time   CHOL 170 11/27/2021 1120   TRIG 37 11/27/2021 1120   HDL 73 11/27/2021 1120   CHOLHDL 2.3 11/27/2021 1120   LDLCALC 89 11/27/2021 1120     Risk Assessment/Calculations:      Physical Exam:    VS:  BP 120/60 (BP Location: Left Arm, Patient Position: Sitting, Cuff Size: Normal)   Pulse 88   Ht 5\' 2"  (1.575 m)   Wt 131 lb 6.4 oz (59.6 kg)   SpO2 98%   BMI 24.03 kg/m     Wt Readings from Last 3 Encounters:  04/10/22 131 lb 6.4 oz (59.6 kg)  03/19/22 136 lb (61.7 kg)  01/30/22 135 lb (61.2 kg)     GEN:  Well nourished, well developed in no acute distress HEENT: Normal NECK: No JVD; No carotid bruits CARDIAC: Irregular RR, no murmurs, rubs, gallops RESPIRATORY:  Clear to auscultation without rales, wheezing or rhonchi  ABDOMEN: Soft, non-tender, non-distended MUSCULOSKELETAL:  Bilateral lower extremity edema. Swelling to lateral aspect RLE. No deformity. 2+ pedal pulses, equal bilaterally SKIN: Warm and dry NEUROLOGIC:  Alert and oriented x 3 PSYCHIATRIC:  Normal affect   EKG:  EKG is not ordered today.    Diagnoses:    1. Hyperlipidemia LDL goal <70   2. Palpitations   3. Essential hypertension   4. Coronary artery disease involving native coronary artery of native heart without angina pectoris   5. S/P CABG x 3   6. Bilateral leg edema     Assessment and  Plan:     Palpitations/Frequent PVCs: Cardiac monitor revealed short burst of SVT, rare PVCs, PACs, rare ventricular bigeminy and trigeminy which was identified on EKG 5/3. Metoprolol tartrate was increased to 25 mg twice daily, however upon further investigation we discovered this was the medication that she could not get from pharmacy (refill requested too soon). Reports feeling better since stopping it, however with hx of ectopy as well as CAD s/p CABG, I would like her to try 12.5 mg twice daily again.  Advised her to notify us if she does not tolerate.   CAD s/p CABG without angina: Doing well s/p CABG with progression of activity. Denies chest pain, dyspnea, or other symptoms concerning for angina. Occasional "electrical twinge" likely nerve related. No indication for further ischemic evaluation at this time. Emphasized importance of LDL < 70. Intolerant of statins. Have referred to lipid clinic.  Continue metoprolol, Imdur, aspirin,    Leg edema: Vascular ultrasound revealed no DVT 03/30/22.  It revealed interstitial fluid in right calf.  Rheumatologist advised her to take Lasix daily.  She reports leg swelling is much better on daily Lasix.    Hyperlipidemia LDL goal < 70: LDL 89 on 11/27/21. History of statin and Zetia intolerance. Was agreeable to try rosuvastatin 5 mg 3 days per week but after last office visit she did not start because she read all the potential side effects. Reports statins have made her feel terrible in the  past. We will refer to lipid clinic for consideration of PCSK9 inhibitor.   Hypertension: BP well-controlled in clinic today and at home.  We are adding low-dose metoprolol tartrate.  Advised her to continue to monitor.   Disposition: 6 months with Dr. Acie Fredrickson   Medication Adjustments/Labs and Tests Ordered: Current medicines are reviewed at length with the patient today.  Concerns regarding medicines are outlined above.  Orders Placed This Encounter  Procedures    AMB Referral to [facility] Pharm-D   Meds ordered this encounter  Medications   metoprolol tartrate (LOPRESSOR) 25 MG tablet    Sig: Take 0.5 tablets (12.5 mg total) by mouth 2 (two) times daily.    Dispense:  180 tablet    Refill:  3    Patient Instructions  Medication Instructions:   DISCONTINUE Rosuvastatin  DECREASE Lopressor one half tablet by mouth (12.5 mg) twice daily.  *If you need a refill on your cardiac medications before your next appointment, please call your pharmacy*   Lab Work:  None ordered.   If you have labs (blood work) drawn today and your tests are completely normal, you will receive your results only by: Christopher (if you have MyChart) OR A paper copy in the mail If you have any lab test that is abnormal or we need to change your treatment, we will call you to review the results.   Testing/Procedures:  None ordered.    Follow-Up: At [facility], you and your health needs are our priority.  As part of our continuing mission to provide you with exceptional heart care, we have created designated Provider Care Teams.  These Care Teams include your primary Cardiologist (physician) and Advanced Practice Providers (APPs -  Physician Assistants and Nurse Practitioners) who all work together to provide you with the care you need, when you need it.  We recommend signing up for the patient portal called "MyChart".  Sign up information is provided on this After Visit Summary.  MyChart is used to connect with patients for Virtual Visits (Telemedicine).  Patients are able to view lab/test results, encounter notes, upcoming appointments, etc.  Non-urgent messages can be sent to your provider as well.   To learn more about what you can do with MyChart, go to [email or web address].    Your next appointment:   6 month(s)  The format for your next appointment:   In Person  Provider:   Mertie Moores, MD     Other Instructions  You have been referred  to Pharm D.    Important Information About Sugar         Signed, Emmaline Life, NP  04/10/2022 11:42 AM    [place]

## 2022-04-10 ENCOUNTER — Ambulatory Visit: Payer: Medicare PPO | Admitting: Nurse Practitioner

## 2022-04-10 ENCOUNTER — Encounter: Payer: Self-pay | Admitting: Nurse Practitioner

## 2022-04-10 ENCOUNTER — Telehealth: Payer: Self-pay | Admitting: *Deleted

## 2022-04-10 VITALS — BP 120/60 | HR 88 | Ht 62.0 in | Wt 131.4 lb

## 2022-04-10 DIAGNOSIS — R002 Palpitations: Secondary | ICD-10-CM | POA: Diagnosis not present

## 2022-04-10 DIAGNOSIS — E785 Hyperlipidemia, unspecified: Secondary | ICD-10-CM | POA: Diagnosis not present

## 2022-04-10 DIAGNOSIS — Z951 Presence of aortocoronary bypass graft: Secondary | ICD-10-CM

## 2022-04-10 DIAGNOSIS — R600 Localized edema: Secondary | ICD-10-CM

## 2022-04-10 DIAGNOSIS — I2510 Atherosclerotic heart disease of native coronary artery without angina pectoris: Secondary | ICD-10-CM | POA: Diagnosis not present

## 2022-04-10 DIAGNOSIS — I10 Essential (primary) hypertension: Secondary | ICD-10-CM

## 2022-04-10 MED ORDER — METOPROLOL TARTRATE 25 MG PO TABS: 12.5000 mg | ORAL_TABLET | Freq: Two times a day (BID) | ORAL | 3 refills | Status: DC

## 2022-04-10 NOTE — Patient Instructions (Signed)
Medication Instructions:   DISCONTINUE Rosuvastatin  DECREASE Lopressor one half tablet by mouth (12.5 mg) twice daily.  *If you need a refill on your cardiac medications before your next appointment, please call your pharmacy*   Lab Work:  None ordered.   If you have labs (blood work) drawn today and your tests are completely normal, you will receive your results only by: MyChart Message (if you have MyChart) OR A paper copy in the mail If you have any lab test that is abnormal or we need to change your treatment, we will call you to review the results.   Testing/Procedures:  None ordered.    Follow-Up: At [facility], you and your health needs are our priority.  As part of our continuing mission to provide you with exceptional heart care, we have created designated Provider Care Teams.  These Care Teams include your primary Cardiologist (physician) and Advanced Practice Providers (APPs -  Physician Assistants and Nurse Practitioners) who all work together to provide you with the care you need, when you need it.  We recommend signing up for the patient portal called "MyChart".  Sign up information is provided on this After Visit Summary.  MyChart is used to connect with patients for Virtual Visits (Telemedicine).  Patients are able to view lab/test results, encounter notes, upcoming appointments, etc.  Non-urgent messages can be sent to your provider as well.   To learn more about what you can do with MyChart, go to [email or web address].    Your next appointment:   6 month(s)  The format for your next appointment:   In Person  Provider:   Kristeen Miss, MD     Other Instructions  You have been referred to Pharm D.    Important Information About Sugar

## 2022-04-10 NOTE — Telephone Encounter (Signed)
S/w with [name] [name] at [facility] to see why pt was denied Lopressor.  Maria the pharm-d stated was to early to pick up medication. Decreased today at pts visit. Pharm-D will fill today Lopressor one half tablet by mouth (12.5 mg) twice daily today. Medication list updated.

## 2022-04-10 NOTE — Telephone Encounter (Signed)
  Patient [name] for Virtual Visit         [name] [name] on 04/10/2022 for a virtual visit (video or telephone).   [name] FOR VIRTUAL VISIT FOR:  [name] [name]  By participating in this virtual visit I agree to the following:  I hereby voluntarily request, [name] and authorize [facility] and its employed or contracted physicians, physician assistants, nurse practitioners or other licensed health care professionals (the Practitioner), to provide me with telemedicine health care services (the "Services") as deemed necessary by the treating Practitioner. I acknowledge and [name] to receive the Services by the Practitioner via telemedicine. I understand that the telemedicine visit will involve communicating with the Practitioner through live audiovisual communication technology and the disclosure of certain medical information by electronic transmission. I acknowledge that I have been given the opportunity to request an in-person assessment or other available alternative prior to the telemedicine visit and am voluntarily participating in the telemedicine visit.  I understand that I have the right to withhold or withdraw my [name] to the use of telemedicine in the course of my care at any time, without affecting my right to future care or treatment, and that the Practitioner or I may terminate the telemedicine visit at any time. I understand that I have the right to inspect all information obtained and/or recorded in the course of the telemedicine visit and may receive copies of available information for a reasonable fee.  I understand that some of the potential risks of receiving the Services via telemedicine include:  Delay or interruption in medical evaluation due to technological equipment failure or disruption; Information transmitted may not be sufficient (e.g. poor resolution of images) to allow for appropriate medical decision making by the Practitioner;  and/or  In rare instances, security protocols could fail, causing a breach of personal health information.  Furthermore, I acknowledge that it is my responsibility to provide information about my medical history, conditions and care that is complete and accurate to the best of my ability. I acknowledge that Practitioner's advice, recommendations, and/or decision may be based on factors not within their control, such as incomplete or inaccurate data provided by me or distortions of diagnostic images or specimens that may result from electronic transmissions. I understand that the practice of medicine is not an exact science and that Practitioner makes no warranties or guarantees regarding treatment outcomes. I acknowledge that a copy of this [name] can be made available to me via my patient portal [facility] MyChart), or I can request a printed copy by calling the office of [facility].    I understand that my insurance will be billed for this visit.   I have read or had this [name] read to me. I understand the contents of this [name], which adequately explains the benefits and risks of the Services being provided via telemedicine.  I have been provided ample opportunity to ask questions regarding this [name] and the Services and have had my questions answered to my satisfaction. I give my informed [name] for the services to be provided through the use of telemedicine in my medical care

## 2022-04-15 ENCOUNTER — Other Ambulatory Visit: Payer: Self-pay | Admitting: Family Medicine

## 2022-04-15 DIAGNOSIS — R229 Localized swelling, mass and lump, unspecified: Secondary | ICD-10-CM

## 2022-04-16 DIAGNOSIS — I10 Essential (primary) hypertension: Secondary | ICD-10-CM | POA: Diagnosis not present

## 2022-04-16 DIAGNOSIS — Z951 Presence of aortocoronary bypass graft: Secondary | ICD-10-CM | POA: Diagnosis not present

## 2022-04-16 DIAGNOSIS — Z789 Other specified health status: Secondary | ICD-10-CM | POA: Diagnosis not present

## 2022-04-16 DIAGNOSIS — E1165 Type 2 diabetes mellitus with hyperglycemia: Secondary | ICD-10-CM | POA: Diagnosis not present

## 2022-04-16 DIAGNOSIS — E785 Hyperlipidemia, unspecified: Secondary | ICD-10-CM | POA: Diagnosis not present

## 2022-04-16 DIAGNOSIS — E1159 Type 2 diabetes mellitus with other circulatory complications: Secondary | ICD-10-CM | POA: Diagnosis not present

## 2022-04-20 ENCOUNTER — Ambulatory Visit: Payer: Medicare PPO | Admitting: Physician Assistant

## 2022-04-30 ENCOUNTER — Other Ambulatory Visit: Payer: Self-pay | Admitting: Cardiovascular Disease

## 2022-05-01 ENCOUNTER — Telehealth: Payer: Self-pay | Admitting: Nurse Practitioner

## 2022-05-01 ENCOUNTER — Other Ambulatory Visit: Payer: Self-pay | Admitting: *Deleted

## 2022-05-01 MED ORDER — NITROGLYCERIN 0.4 MG SL SUBL: 0.4000 mg | SUBLINGUAL_TABLET | SUBLINGUAL | 1 refills | Status: DC | PRN

## 2022-05-01 NOTE — Telephone Encounter (Signed)
See my chart message

## 2022-05-01 NOTE — Telephone Encounter (Signed)
Received request to refill nitroglycerin. It was stopped after her CABG in January which is common due to having new bypass grafts.  I want to make certain she is not having any chest pain. We can refill the NTG for her to keep on hand but she should let us know if she is having chest pain.

## 2022-05-04 ENCOUNTER — Other Ambulatory Visit: Payer: Medicare PPO

## 2022-05-06 ENCOUNTER — Other Ambulatory Visit: Payer: Self-pay

## 2022-05-06 ENCOUNTER — Observation Stay (HOSPITAL_BASED_OUTPATIENT_CLINIC_OR_DEPARTMENT_OTHER)
Admission: EM | Admit: 2022-05-06 | Discharge: 2022-05-07 | Disposition: A | Discharge status: Home / Self Care | Payer: Medicare PPO | Attending: Internal Medicine | Admitting: Internal Medicine

## 2022-05-06 ENCOUNTER — Encounter (HOSPITAL_BASED_OUTPATIENT_CLINIC_OR_DEPARTMENT_OTHER): Payer: Self-pay

## 2022-05-06 ENCOUNTER — Emergency Department (HOSPITAL_BASED_OUTPATIENT_CLINIC_OR_DEPARTMENT_OTHER): Payer: Medicare PPO | Admitting: Radiology

## 2022-05-06 ENCOUNTER — Telehealth: Payer: Self-pay | Admitting: Cardiovascular Disease

## 2022-05-06 DIAGNOSIS — I10 Essential (primary) hypertension: Secondary | ICD-10-CM | POA: Diagnosis present

## 2022-05-06 DIAGNOSIS — Z87891 Personal history of nicotine dependence: Secondary | ICD-10-CM | POA: Diagnosis not present

## 2022-05-06 DIAGNOSIS — Z7982 Long term (current) use of aspirin: Secondary | ICD-10-CM | POA: Insufficient documentation

## 2022-05-06 DIAGNOSIS — I2510 Atherosclerotic heart disease of native coronary artery without angina pectoris: Secondary | ICD-10-CM | POA: Diagnosis present

## 2022-05-06 DIAGNOSIS — Z951 Presence of aortocoronary bypass graft: Secondary | ICD-10-CM | POA: Diagnosis not present

## 2022-05-06 DIAGNOSIS — R0789 Other chest pain: Secondary | ICD-10-CM | POA: Diagnosis not present

## 2022-05-06 DIAGNOSIS — R079 Chest pain, unspecified: Secondary | ICD-10-CM | POA: Diagnosis not present

## 2022-05-06 DIAGNOSIS — R072 Precordial pain: Secondary | ICD-10-CM | POA: Diagnosis not present

## 2022-05-06 DIAGNOSIS — Z794 Long term (current) use of insulin: Secondary | ICD-10-CM | POA: Diagnosis not present

## 2022-05-06 DIAGNOSIS — M069 Rheumatoid arthritis, unspecified: Secondary | ICD-10-CM | POA: Diagnosis present

## 2022-05-06 DIAGNOSIS — E119 Type 2 diabetes mellitus without complications: Secondary | ICD-10-CM | POA: Diagnosis not present

## 2022-05-06 DIAGNOSIS — Z79899 Other long term (current) drug therapy: Secondary | ICD-10-CM | POA: Diagnosis not present

## 2022-05-06 DIAGNOSIS — J439 Emphysema, unspecified: Secondary | ICD-10-CM | POA: Diagnosis not present

## 2022-05-06 LAB — CBC
HCT: 39.5 % (ref 36.0–46.0)
Hemoglobin: 12.5 g/dL (ref 12.0–15.0)
MCH: 28.2 pg (ref 26.0–34.0)
MCHC: 31.6 g/dL (ref 30.0–36.0)
MCV: 89.2 fL (ref 80.0–100.0)
Platelets: 242 10*3/uL (ref 150–400)
RBC: 4.43 MIL/uL (ref 3.87–5.11)
RDW: 16.7 % — ABNORMAL HIGH (ref 11.5–15.5)
WBC: 5.6 10*3/uL (ref 4.0–10.5)
nRBC: 0 % (ref 0.0–0.2)

## 2022-05-06 LAB — BASIC METABOLIC PANEL
Anion gap: 11 (ref 5–15)
BUN: 17 mg/dL (ref 8–23)
CO2: 25 mmol/L (ref 22–32)
Calcium: 10.2 mg/dL (ref 8.9–10.3)
Chloride: 102 mmol/L (ref 98–111)
Creatinine, Ser: 1.08 mg/dL — ABNORMAL HIGH (ref 0.44–1.00)
GFR, Estimated: 52 mL/min — ABNORMAL LOW (ref >60)
Glucose, Bld: 128 mg/dL — ABNORMAL HIGH (ref 70–99)
Potassium: 4 mmol/L (ref 3.5–5.1)
Sodium: 138 mmol/L (ref 135–145)

## 2022-05-06 LAB — TROPONIN I (HIGH SENSITIVITY)
Troponin I (High Sensitivity): 7 ng/L (ref <18)
Troponin I (High Sensitivity): 8 ng/L (ref <18)

## 2022-05-06 MED ORDER — SODIUM CHLORIDE 0.9 % IV SOLN: INTRAVENOUS | Status: DC

## 2022-05-06 MED ORDER — NITROGLYCERIN 0.4 MG SL SUBL
0.4000 mg | SUBLINGUAL_TABLET | SUBLINGUAL | Status: DC | PRN
  Administered 2022-05-06 – 2022-05-07 (×2): 0.4 mg via SUBLINGUAL
  Filled 2022-05-06: qty 1

## 2022-05-06 NOTE — Telephone Encounter (Signed)
Called patient to let her know that Marcelino Duster agrees with ER evaluation. Patient verbalized understanding.

## 2022-05-06 NOTE — Plan of Care (Signed)
Cp non cardiac at night Hx of cabg EKG unchanged Trop wnl- 1 Repeat trop- negative. EKG - unchanged. ACS- rulled out ED decided to admit due to recurrent nature and concern of return to ed if dc. Pt is anxios/ new dementia?. Plan Repeat TTE- once completed please reach out to cardiology

## 2022-05-06 NOTE — ED Triage Notes (Signed)
Patient here POV from Home.  Endorses CP Intermittently for approximately 1-2 Weeks. States it may be worsening.   CABG in January; 3 SL NTG Intermittently throughout the Day today. No SOB. Mild Nausea. No Emesis. No Diarrhea.  NAD Noted during Triage. A&Ox4. GCS 15. Ambulatory.

## 2022-05-06 NOTE — Telephone Encounter (Signed)
Pt c/o of Chest Pain: STAT if CP now or developed within 24 hours  1. Are you having CP right now? No   2. Are you experiencing any other symptoms (ex. SOB, nausea, vomiting, sweating)? No   3. How long have you been experiencing CP? At least a week   4. Is your CP continuous or coming and going? Coming and going   5. Have you taken Nitroglycerin? Yes, took around 4 AM and 6 AM ?   Transferring to triage due to patient taking two nitroglycerin today.

## 2022-05-06 NOTE — Telephone Encounter (Signed)
Agree that she should be evaluated in ED. With recent CABG, she could have a graft that is not working.

## 2022-05-06 NOTE — ED Provider Notes (Addendum)
[facility] EMERGENCY DEPT Provider Note   CSN: [id number] Arrival date & time: 05/06/22  1941     History  Chief Complaint  Patient presents with   Chest Pain    [name] [name] is a 81 y.o. female.  Patient status post CABG in January.  For the past 2 weeks patient's been getting substernal chest pain that radiates to the left shoulder area pretty much every night.'s been getting worse over the last few days.  Patient has been taking her nitroglycerin at night for the past few days pain usually goes away within 5 minutes.  Patient contacted cardiology today they recommended ED evaluation.  Patient has not had any chest pain at all during the daytime.  Here this evening she got an episode of the pain after not having it the whole time she was here and her blood pressure shoot up to systolic around 189.  We are before we were getting blood pressures that were quite reasonable like 121/56.  Patient had EMS come out to the house on Friday for similar episodes that have been now ongoing and they did an EKG and checked her vital signs everything was fine patient was not seen.  Patient denies any shortness of breath with any nausea or vomiting.  EKG here tonight without any acute changes compared to old.  Patient's initial troponin was normal.  Past medical history significant for coronary artery disease rheumatoid arthritis carotid artery disease diabetes and hypertension.  Patient's had an abdominal hysterectomy and gallbladder removed.       Home Medications Prior to Admission medications   Medication Sig Start Date End Date Taking? Authorizing Provider  Abatacept 125 MG/ML SOSY Inject 1 Dose into the skin every 30 (thirty) days.    [provider]  acetaminophen (TYLENOL) 325 MG tablet Take 2 tablets (650 mg total) by mouth every 6 (six) hours as needed for moderate pain, fever or headache. 12/16/21   Barrett, Erin R, PA-C  ALPRAZolam (XANAX) 0.25 MG tablet Take  0.25 mg by mouth daily as needed. 07/02/21   [provider]  aspirin 81 MG tablet Take 1 tablet (81 mg total) by mouth daily. 07/26/13   Luis Abed, MD  B-D UF III MINI PEN NEEDLES 31G X 5 MM MISC USE WITH INSULIN TO INJECT BID UTD 02/14/19   [provider]  Cholecalciferol (VITAMIN D-3) 1000 units CAPS Take 1,000 Units by mouth daily.     [provider]  folic acid (FOLVITE) 1 MG tablet Take 1 mg by mouth every evening.     [provider]  furosemide (LASIX) 40 MG tablet Take 1 tablet (40 mg total) by mouth as needed for fluid (wt gain of 3 lbs in 24 hours.). 12/30/21   Tereso Newcomer T, PA-C  Insulin Lispro Prot & Lispro (HUMALOG 75/25 MIX) (75-25) 100 UNIT/ML Kwikpen Inject 3-5 Units into the skin See admin instructions. Inject 5 units in the morning and 3 units in the evening 06/28/21   [provider]  isosorbide mononitrate (IMDUR) 30 MG 24 hr tablet Take 2 tablets (60 mg total) by mouth daily. 03/19/22 03/20/23  Swinyer, Zachary George, NP  losartan (COZAAR) 25 MG tablet Take 1 tablet (25 mg total) by mouth daily. 11/27/21   Nahser, Deloris Ping, MD  methotrexate 250 MG/10ML injection Inject 10 mg into the muscle every Sunday. 06/29/19   [provider]  metoprolol tartrate (LOPRESSOR) 25 MG tablet Take 0.5 tablets (12.5 mg total) by  mouth 2 (two) times daily. 04/10/22   Swinyer, Lanice Schwab, NP  Multiple Vitamin (MULTIVITAMIN WITH MINERALS) TABS tablet Take 1 tablet by mouth daily. Centrum silver    [provider]  nitroGLYCERIN (NITROSTAT) 0.4 MG SL tablet Place 1 tablet (0.4 mg total) under the tongue every 5 (five) minutes as needed for chest pain. 05/01/22 07/30/22  Swinyer, Lanice Schwab, NP  Glory Rosebush VERIO test strip USE 1 STRIP TID UTD 01/07/19   [provider]  pantoprazole (PROTONIX) 40 MG tablet Take 40 mg by mouth daily.    [provider]  potassium chloride (KLOR-CON) 10 MEQ tablet Take 1 tablet (10 mEq total) by  mouth as needed (wt gain of 3 lbs in 24 hours to be taken with Lasix). 12/30/21   Richardson Dopp T, PA-C  traMADol (ULTRAM) 50 MG tablet Take 1 tablet (50 mg total) by mouth every 6 (six) hours as needed for severe pain. 12/24/21   Nani Skillern, PA-C      Allergies    Cholesterol, Hydrocodone, Statins, and Metformin and related    Review of Systems   Review of Systems  Constitutional:  Negative for chills and fever.  HENT:  Negative for ear pain and sore throat.   Eyes:  Negative for pain and visual disturbance.  Respiratory:  Negative for cough and shortness of breath.   Cardiovascular:  Positive for chest pain. Negative for palpitations.  Gastrointestinal:  Negative for abdominal pain and vomiting.  Genitourinary:  Negative for dysuria and hematuria.  Musculoskeletal:  Negative for arthralgias and back pain.  Skin:  Negative for color change and rash.  Neurological:  Negative for seizures and syncope.  All other systems reviewed and are negative.   Physical Exam Updated Vital Signs BP (!) 110/57   Pulse 61   Temp 98.7 F (37.1 C)   Resp 17   Ht 1.575 m (5\' 2" )   Wt 59.6 kg   SpO2 100%   BMI 24.03 kg/m  Physical Exam Vitals and nursing note reviewed.  Constitutional:      General: She is not in acute distress.    Appearance: Normal appearance. She is well-developed.  HENT:     Head: Normocephalic and atraumatic.  Eyes:     Extraocular Movements: Extraocular movements intact.     Conjunctiva/sclera: Conjunctivae normal.     Pupils: Pupils are equal, round, and reactive to light.  Cardiovascular:     Rate and Rhythm: Normal rate and regular rhythm.     Heart sounds: No murmur heard. Pulmonary:     Effort: Pulmonary effort is normal. No respiratory distress.     Breath sounds: Normal breath sounds. No wheezing or rales.  Abdominal:     Palpations: Abdomen is soft.     Tenderness: There is no abdominal tenderness.  Musculoskeletal:        General: No swelling.      Cervical back: Neck supple.  Skin:    General: Skin is warm and dry.     Capillary Refill: Capillary refill takes less than 2 seconds.  Neurological:     General: No focal deficit present.     Mental Status: She is alert and oriented to person, place, and time.  Psychiatric:        Mood and Affect: Mood normal.     ED Results / Procedures / Treatments   Labs (all labs ordered are listed, but only abnormal results are displayed) Labs Reviewed  BASIC METABOLIC PANEL - Abnormal;  Notable for the following components:      Result Value   Glucose, Bld 128 (*)    Creatinine, Ser 1.08 (*)    GFR, Estimated 52 (*)    All other components within normal limits  CBC - Abnormal; Notable for the following components:   RDW 16.7 (*)    All other components within normal limits  TROPONIN I (HIGH SENSITIVITY)  TROPONIN I (HIGH SENSITIVITY)    EKG EKG Interpretation  Date/Time:  Wednesday May 06 2022 19:48:18 EDT Ventricular Rate:  71 PR Interval:  150 QRS Duration: 74 QT Interval:  360 QTC Calculation: 391 R Axis:   81 Text Interpretation: Normal sinus rhythm Nonspecific ST and T wave abnormality Abnormal ECG When compared with ECG of 08-Dec-2021 20:08, Borderline criteria for Inferior infarct are no longer Present Confirmed by Fredia Sorrow [phone]) on 05/06/2022 9:48:43 PM  Radiology DG Chest 2 View  Result Date: 05/06/2022 CLINICAL DATA:  Chest pain. EXAM: CHEST - 2 VIEW COMPARISON:  Chest radiograph dated 01/30/2022. FINDINGS: Background of emphysema. No focal consolidation, pleural effusion, or pneumothorax. The cardiac silhouette is within normal limits. Atherosclerotic calcification of the aorta. Median sternotomy wires. Osteopenia with degenerative changes of the spine and shoulders. No acute osseous pathology. IMPRESSION: No active cardiopulmonary disease. Electronically Signed   By: Anner Crete M.D.   On: 05/06/2022 20:16    Procedures Procedures    Medications  Ordered in ED Medications  0.9 %  sodium chloride infusion (has no administration in time range)  nitroGLYCERIN (NITROSTAT) SL tablet 0.4 mg (has no administration in time range)    ED Course/ Medical Decision Making/ A&P                           Medical Decision Making Amount and/or Complexity of Data Reviewed Labs: ordered. Radiology: ordered.  Risk Prescription drug management. Decision regarding hospitalization.   Patient's patterns interesting to his pain at night.  I think if she had main pain 3 and we had 2 negative troponins probably could go and follow-up with cardiology but because of tonight's event where she got the pain again I am giving her nitro for that blood pressure shoot up into the 180s where the they were basically normal prior without the pain.  There could be a bit of demand problem here.  Will discuss with cardiology.  Basic metabolic panel creatinine is 1.08 little bit off on her GFR from baseline.  Patient's creatinine was 0.88 on May 4.  CBC was normal chest x-ray negative.  And initial troponin was 7.  Call placed to cardiology to discuss the patient.  Discussed with cardiology.  We will get a repeat EKG but her pain actually went away within 5 minutes on its own without nitroglycerin.  Pressed on her chest several times get a hold on putting lidocaine gel on her chest.  I do not think that that is part of the problem.  Patient was sitting up when this 1 occurred.  Patient very difficult historically is very hard for her to say how long this all lasts.  It sounds as if she waits about 5 minutes takes her nitroglycerin pain goes away and comes back maybe an hour later only occurs at nighttime.  Today's event went away without is giving her anything.  Is possible there could be a component of some anxiety associated with this.  Patient with recurrent pain occurring.  EKG done during that no  significant changes.  Patient's troponins x2 without any significant  abnormalities.  First troponin was 7 and repeat was 8.  Will reconsult with cardiology.  Patient may need to be admitted and just serially checked.  She keeps having these events that seem to scare her a lot.  Discussed with the cardiology fellow.  He is recommending medicine admission and serial troponins.  Clinically seems unlikely to be acute cardiac event but patient keeps having recurrent episodes.  We have had an EKG during one of the episodes without any significant changes.  Troponins have been stable.  But events that keep recurring and patient demands nitro the nitro takes it away.  I am going to try some Nitropaste.  Discussed with with hospitalist Dr. Johny Drilling.  They will admit.  He does see request that cardiology see the patient when she gets to [facility].  May very well take a while before patient gets there.  We will inform the fellow of that request.  Final Clinical Impression(s) / ED Diagnoses Final diagnoses:  Precordial pain    Rx / DC Orders ED Discharge Orders     None         Vanetta Mulders, MD 05/06/22 [id number]    Vanetta Mulders, MD 05/06/22 [id number]    Vanetta Mulders, MD 05/06/22 [id number]    Vanetta Mulders, MD 05/07/22 0006    Vanetta Mulders, MD 05/07/22 0023

## 2022-05-06 NOTE — ED Notes (Signed)
Patient reports an episode of chest discomfort. Patient observed to be very uncomfortable.  Ekg send from monitor. Provider notified. Nitro given

## 2022-05-06 NOTE — Telephone Encounter (Signed)
Spoke with the patient who reports that she has been having intermittent chest pain for the past week. She states that it usually starts when she wakes up in the morning. She describes it as pressure across her chest that radiates into her shoulder. She states that she had similar pain last time she had a heart attack.  Reports that on Friday or Saturday her daughter called EMS and they came and checked her our, vital signs were fine but they advised her to go to the ER. Patient refused. She states that she has taken 3 nitroglycerin today about 3 hours apart. She states that it helps but then the pain comes back. I have advised the patient that she should go to the ER. Patient is very hesitant. Reiterated the importance of going to the ER.

## 2022-05-07 ENCOUNTER — Observation Stay (HOSPITAL_BASED_OUTPATIENT_CLINIC_OR_DEPARTMENT_OTHER): Payer: Medicare PPO

## 2022-05-07 ENCOUNTER — Encounter (HOSPITAL_COMMUNITY): Payer: Self-pay | Admitting: Family Medicine

## 2022-05-07 DIAGNOSIS — I10 Essential (primary) hypertension: Secondary | ICD-10-CM | POA: Diagnosis not present

## 2022-05-07 DIAGNOSIS — R072 Precordial pain: Secondary | ICD-10-CM | POA: Diagnosis present

## 2022-05-07 DIAGNOSIS — I2510 Atherosclerotic heart disease of native coronary artery without angina pectoris: Secondary | ICD-10-CM

## 2022-05-07 DIAGNOSIS — R079 Chest pain, unspecified: Secondary | ICD-10-CM | POA: Diagnosis not present

## 2022-05-07 DIAGNOSIS — R0789 Other chest pain: Secondary | ICD-10-CM | POA: Diagnosis not present

## 2022-05-07 DIAGNOSIS — Z79899 Other long term (current) drug therapy: Secondary | ICD-10-CM | POA: Diagnosis not present

## 2022-05-07 DIAGNOSIS — E119 Type 2 diabetes mellitus without complications: Secondary | ICD-10-CM

## 2022-05-07 DIAGNOSIS — Z794 Long term (current) use of insulin: Secondary | ICD-10-CM | POA: Diagnosis not present

## 2022-05-07 DIAGNOSIS — Z87891 Personal history of nicotine dependence: Secondary | ICD-10-CM | POA: Diagnosis not present

## 2022-05-07 DIAGNOSIS — Z951 Presence of aortocoronary bypass graft: Secondary | ICD-10-CM | POA: Diagnosis not present

## 2022-05-07 DIAGNOSIS — Z7982 Long term (current) use of aspirin: Secondary | ICD-10-CM | POA: Diagnosis not present

## 2022-05-07 LAB — BASIC METABOLIC PANEL
Anion gap: 7 (ref 5–15)
BUN: 16 mg/dL (ref 8–23)
CO2: 24 mmol/L (ref 22–32)
Calcium: 9.2 mg/dL (ref 8.9–10.3)
Chloride: 107 mmol/L (ref 98–111)
Creatinine, Ser: 1.04 mg/dL — ABNORMAL HIGH (ref 0.44–1.00)
GFR, Estimated: 54 mL/min — ABNORMAL LOW (ref >60)
Glucose, Bld: 136 mg/dL — ABNORMAL HIGH (ref 70–99)
Potassium: 3.7 mmol/L (ref 3.5–5.1)
Sodium: 138 mmol/L (ref 135–145)

## 2022-05-07 LAB — CBC
HCT: 34.8 % — ABNORMAL LOW (ref 36.0–46.0)
Hemoglobin: 11.4 g/dL — ABNORMAL LOW (ref 12.0–15.0)
MCH: 29.5 pg (ref 26.0–34.0)
MCHC: 32.8 g/dL (ref 30.0–36.0)
MCV: 89.9 fL (ref 80.0–100.0)
Platelets: 217 10*3/uL (ref 150–400)
RBC: 3.87 MIL/uL (ref 3.87–5.11)
RDW: 16.8 % — ABNORMAL HIGH (ref 11.5–15.5)
WBC: 5.7 10*3/uL (ref 4.0–10.5)
nRBC: 0 % (ref 0.0–0.2)

## 2022-05-07 LAB — GLUCOSE, CAPILLARY
Glucose-Capillary: 113 mg/dL — ABNORMAL HIGH (ref 70–99)
Glucose-Capillary: 102 mg/dL — ABNORMAL HIGH (ref 70–99)
Glucose-Capillary: 237 mg/dL — ABNORMAL HIGH (ref 70–99)

## 2022-05-07 LAB — TROPONIN I (HIGH SENSITIVITY): Troponin I (High Sensitivity): 12 ng/L (ref <18)

## 2022-05-07 LAB — ECHOCARDIOGRAM LIMITED
Height: 61.5 in
S' Lateral: 2.8 cm
Weight: 2104 oz

## 2022-05-07 MED ORDER — ACETAMINOPHEN 325 MG PO TABS: 650.0000 mg | ORAL_TABLET | Freq: Four times a day (QID) | ORAL | Status: DC | PRN

## 2022-05-07 MED ORDER — ONDANSETRON HCL 4 MG/2ML IJ SOLN: 4.0000 mg | Freq: Four times a day (QID) | INTRAMUSCULAR | Status: DC | PRN

## 2022-05-07 MED ORDER — INSULIN ASPART 100 UNIT/ML IJ SOLN
0.0000 [IU] | Freq: Three times a day (TID) | INTRAMUSCULAR | Status: DC
  Administered 2022-05-07: 2 [IU] via SUBCUTANEOUS

## 2022-05-07 MED ORDER — ACETAMINOPHEN 325 MG PO TABS: 650.0000 mg | ORAL_TABLET | ORAL | Status: DC | PRN

## 2022-05-07 MED ORDER — NITROGLYCERIN 0.4 MG SL SUBL
SUBLINGUAL_TABLET | SUBLINGUAL | Status: AC
  Filled 2022-05-07: qty 1

## 2022-05-07 MED ORDER — SODIUM CHLORIDE 0.9 % IV BOLUS
500.0000 mL | Freq: Once | INTRAVENOUS | Status: AC
  Administered 2022-05-07: 500 mL via INTRAVENOUS

## 2022-05-07 MED ORDER — NITROGLYCERIN 2 % TD OINT: 0.5000 [in_us] | TOPICAL_OINTMENT | Freq: Once | TRANSDERMAL | Status: DC

## 2022-05-07 MED ORDER — ENOXAPARIN SODIUM 40 MG/0.4ML IJ SOSY
40.0000 mg | PREFILLED_SYRINGE | INTRAMUSCULAR | Status: DC
  Administered 2022-05-07: 40 mg via SUBCUTANEOUS
  Filled 2022-05-07: qty 0.4

## 2022-05-07 MED ORDER — ASPIRIN 81 MG PO TBEC
81.0000 mg | DELAYED_RELEASE_TABLET | Freq: Every day | ORAL | Status: DC
  Administered 2022-05-07: 81 mg via ORAL
  Filled 2022-05-07: qty 1

## 2022-05-07 MED ORDER — ACETAMINOPHEN 500 MG PO TABS: 500.0000 mg | ORAL_TABLET | Freq: Four times a day (QID) | ORAL | 0 refills | Status: DC | PRN

## 2022-05-07 MED ORDER — NITROGLYCERIN 0.4 MG SL SUBL
SUBLINGUAL_TABLET | SUBLINGUAL | Status: AC
  Administered 2022-05-07: 0.4 mg
  Filled 2022-05-07: qty 1

## 2022-05-07 MED ORDER — TRAMADOL HCL 50 MG PO TABS: 50.0000 mg | ORAL_TABLET | Freq: Four times a day (QID) | ORAL | 0 refills | Status: DC | PRN

## 2022-05-07 MED ORDER — METOPROLOL TARTRATE 12.5 MG HALF TABLET
12.5000 mg | ORAL_TABLET | Freq: Two times a day (BID) | ORAL | Status: DC
  Administered 2022-05-07: 12.5 mg via ORAL
  Filled 2022-05-07: qty 1

## 2022-05-07 MED ORDER — ISOSORBIDE MONONITRATE ER 60 MG PO TB24
60.0000 mg | ORAL_TABLET | Freq: Every day | ORAL | Status: DC
  Administered 2022-05-07: 60 mg via ORAL
  Filled 2022-05-07: qty 1

## 2022-05-07 MED ORDER — PANTOPRAZOLE SODIUM 40 MG PO TBEC
40.0000 mg | DELAYED_RELEASE_TABLET | Freq: Every day | ORAL | Status: DC
  Administered 2022-05-07: 40 mg via ORAL
  Filled 2022-05-07: qty 1

## 2022-05-07 NOTE — H&P (Signed)
History and Physical    [name] [name] KVQ:[id number] DOB: 1941/03/12 DOA: 05/06/2022  PCP: Irena Reichmann, DO   Patient coming from: Home   Chief Complaint: Chest pain   HPI: [name] [name] is an 81 y.o. female with medical history significant for rheumatoid arthritis, hypertension, insulin-dependent diabetes mellitus, and CAD with CABG in January 2023, now presenting to the emergency department for evaluation of chest pain.  Patient reports 2 weeks of intermittent chest pain that occurs at night, sometimes waking her from her sleep, and radiates towards the shoulder.  She has been taking nitroglycerin for this.  Pain intensity and frequency of episodes has increased over the past few days, prompting her daughter to call EMS on 05/02/2022 but the patient refused transport to the ED at that time.  She had 3 episodes the night of 05/05/2022 which is more than usual.  She called her cardiology clinic and was directed to the ED.  There is no associated shortness of breath or nausea but she has felt sweaty with some of the episodes.  She reports improvement with nitroglycerin.  [facility] ED Course: Upon arrival to the ED, patient is found to be afebrile and saturating well on room air with systolic blood pressures ranging from 98-189.  EKG features sinus rhythm with diffuse T wave abnormality.  Chest x-ray negative for acute cardiopulmonary disease.  Chemistry panel notable for creatinine 1.08.  High-sensitivity troponin was normal x2.  Patient continued to have episodes of pain in the emergency department, cardiology was consulted by the ED physician, patient was transferred to [facility] for admission, and cardiology recommended echocardiogram.  Review of Systems:  All other systems reviewed and apart from HPI, are negative.  Past Medical History:  Diagnosis Date   CAD (coronary artery disease)    s/p MI in 1997 tx with POBA to LCx // s/p CABG 11/2021   Carotid artery disease  (HCC)    [place] 1/23: L 1-39   Diabetes mellitus    Drug therapy    Intermittent steroid use   Dyslipidemia    Edema    Ejection fraction    EF 60%, echo, November, 2011, trivial pericardial effusion // Echo 1/23: EF 60-65   HTN (hypertension) 12/29/2021   Rheumatoid arthritis(714.0)    Hospitalization August, 2011, severe RA flare,    Statin intolerance     Past Surgical History:  Procedure Laterality Date   ABDOMINAL HYSTERECTOMY     CHOLECYSTECTOMY     CORONARY ARTERY BYPASS GRAFT N/A 12/08/2021   Procedure: CORONARY ARTERY BYPASS GRAFTING (CABG) X 3, ON PUMP< USING LEFT INTERNAL MAMMARY ARTERY AND RIGHT ENDOSCOPIC GREATER SAPHENOUS VEIN CONDUITS;  Surgeon: Corliss Skains, MD;  Location: [facility] OR;  Service: Open Heart Surgery;  Laterality: N/A;   ENDOVEIN HARVEST OF GREATER SAPHENOUS VEIN Right 12/08/2021   Procedure: ENDOVEIN HARVEST OF GREATER SAPHENOUS VEIN;  Surgeon: Corliss Skains, MD;  Location: [facility] OR;  Service: Open Heart Surgery;  Laterality: Right;   LEFT HEART CATH AND CORONARY ANGIOGRAPHY N/A 01/10/2019   Procedure: LEFT HEART CATH AND CORONARY ANGIOGRAPHY;  Surgeon: [place], Peter M, MD;  Location[facility] INVASIVE CV LAB;  Service: Cardiovascular;  Laterality: N/A;   LEFT HEART CATH AND CORONARY ANGIOGRAPHY N/A 12/02/2021   Procedure: LEFT HEART CATH AND CORONARY ANGIOGRAPHY;  Surgeon: Lyn Records, MD;  Location: [facility] INVASIVE CV LAB;  Service: Cardiovascular;  Laterality: N/A;   TEE WITHOUT CARDIOVERSION N/A 12/08/2021   Procedure: TRANSESOPHAGEAL ECHOCARDIOGRAM (  TEE);  Surgeon: Lajuana Matte, MD;  Location: [facility];  Service: Open Heart Surgery;  Laterality: N/A;    Social History:   reports that she quit smoking about 27 years ago. Her smoking use included cigarettes. She has never used smokeless tobacco. She reports that she does not drink alcohol and does not use drugs.  Allergies  Allergen Reactions   Cholesterol     All cholesterol meds cause pain and  aching in joints   Hydrocodone Nausea Only   Statins     Joint pain   Metformin And Related     Joint pain    Family History  Problem Relation Age of Onset   Hypertension Mother    Diabetes Mother    Other Father      Prior to Admission medications   Medication Sig Start Date End Date Taking? Authorizing Provider  Abatacept 125 MG/ML SOSY Inject 1 Dose into the skin every 30 (thirty) days.    [provider]  acetaminophen (TYLENOL) 325 MG tablet Take 2 tablets (650 mg total) by mouth every 6 (six) hours as needed for moderate pain, fever or headache. 12/16/21   Barrett, Erin R, PA-C  ALPRAZolam (XANAX) 0.25 MG tablet Take 0.25 mg by mouth daily as needed. 07/02/21   [provider]  aspirin 81 MG tablet Take 1 tablet (81 mg total) by mouth daily. 07/26/13   Carlena Bjornstad, MD  B-D UF III MINI PEN NEEDLES 31G X 5 MM MISC USE WITH INSULIN TO INJECT BID UTD 02/14/19   [provider]  Cholecalciferol (VITAMIN D-3) 1000 units CAPS Take 1,000 Units by mouth daily.     [provider]  folic acid (FOLVITE) 1 MG tablet Take 1 mg by mouth every evening.     [provider]  furosemide (LASIX) 40 MG tablet Take 1 tablet (40 mg total) by mouth as needed for fluid (wt gain of 3 lbs in 24 hours.). 12/30/21   Richardson Dopp T, PA-C  Insulin Lispro Prot & Lispro (HUMALOG 75/25 MIX) (75-25) 100 UNIT/ML Kwikpen Inject 3-5 Units into the skin See admin instructions. Inject 5 units in the morning and 3 units in the evening 06/28/21   [provider]  isosorbide mononitrate (IMDUR) 30 MG 24 hr tablet Take 2 tablets (60 mg total) by mouth daily. 03/19/22 03/20/23  Swinyer, Lanice Schwab, NP  losartan (COZAAR) 25 MG tablet Take 1 tablet (25 mg total) by mouth daily. 11/27/21   Nahser, Wonda Cheng, MD  methotrexate 250 MG/10ML injection Inject 10 mg into the muscle every Sunday. 06/29/19   [provider]  metoprolol tartrate (LOPRESSOR) 25 MG tablet Take 0.5  tablets (12.5 mg total) by mouth 2 (two) times daily. 04/10/22   Swinyer, Lanice Schwab, NP  Multiple Vitamin (MULTIVITAMIN WITH MINERALS) TABS tablet Take 1 tablet by mouth daily. Centrum silver    [provider]  nitroGLYCERIN (NITROSTAT) 0.4 MG SL tablet Place 1 tablet (0.4 mg total) under the tongue every 5 (five) minutes as needed for chest pain. 05/01/22 07/30/22  Swinyer, Lanice Schwab, NP  Glory Rosebush VERIO test strip USE 1 STRIP TID UTD 01/07/19   [provider]  pantoprazole (PROTONIX) 40 MG tablet Take 40 mg by mouth daily.    [provider]  potassium chloride (KLOR-CON) 10 MEQ tablet Take 1 tablet (10 mEq total) by mouth as needed (wt gain of 3 lbs in 24 hours to be taken with Lasix). 12/30/21   Kathlen Mody,  Scott T, PA-C  traMADol (ULTRAM) 50 MG tablet Take 1 tablet (50 mg total) by mouth every 6 (six) hours as needed for severe pain. 12/24/21   Nani Skillern, PA-C    Physical Exam: Vitals:   05/07/22 0015 05/07/22 0030 05/07/22 0053 05/07/22 0227  BP: (!) 98/55 (!) 101/50 (!) 115/57 (!) 147/57  Pulse: 68 71 63 (!) 59  Resp: 18 16 18 18   Temp:   98 F (36.7 C) 98 F (36.7 C)  TempSrc:   Oral Oral  SpO2: 98% 98% 98% 99%  Weight:    59.6 kg  Height:    5' 1.5" (1.562 m)    Constitutional: NAD, calm  Eyes: PERTLA, lids and conjunctivae normal ENMT: Mucous membranes are moist. Posterior pharynx clear of any exudate or lesions.   Neck: supple, no masses  Respiratory: no wheezing, no crackles. No accessory muscle use.  Cardiovascular: S1 & S2 heard, regular rate and rhythm. No extremity edema.  Abdomen: No distension, no tenderness, soft. Bowel sounds active.  Musculoskeletal: no clubbing / cyanosis. No joint deformity upper and lower extremities.   Skin: no significant rashes, lesions, ulcers. Warm, dry, well-perfused. Neurologic: CN 2-12 grossly intact. Moving all extremities. Alert and oriented.  Psychiatric: Calm. Cooperative.    Labs and Imaging on  Admission: I have personally reviewed following labs and imaging studies  CBC: Recent Labs  Lab 05/06/22 1958 05/07/22 0254  WBC 5.6 5.7  HGB 12.5 11.4*  HCT 39.5 34.8*  MCV 89.2 89.9  PLT 242 [id number]   Basic Metabolic Panel: Recent Labs  Lab 05/06/22 1958 05/07/22 0254  NA 138 138  K 4.0 3.7  CL 102 107  CO2 25 24  GLUCOSE 128* 136*  BUN 17 16  CREATININE 1.08* 1.04*  CALCIUM 10.2 9.2   GFR: Estimated Creatinine Clearance: 35.6 mL/min (A) (by C-G formula based on SCr of 1.04 mg/dL (H)). Liver Function Tests: No results for input(s): "AST", "ALT", "ALKPHOS", "BILITOT", "PROT", "ALBUMIN" in the last 168 hours. No results for input(s): "LIPASE", "AMYLASE" in the last 168 hours. No results for input(s): "AMMONIA" in the last 168 hours. Coagulation Profile: No results for input(s): "INR", "PROTIME" in the last 168 hours. Cardiac Enzymes: No results for input(s): "CKTOTAL", "CKMB", "CKMBINDEX", "TROPONINI" in the last 168 hours. BNP (last 3 results) No results for input(s): "PROBNP" in the last 8760 hours. HbA1C: No results for input(s): "HGBA1C" in the last 72 hours. CBG: No results for input(s): "GLUCAP" in the last 168 hours. Lipid Profile: No results for input(s): "CHOL", "HDL", "LDLCALC", "TRIG", "CHOLHDL", "LDLDIRECT" in the last 72 hours. Thyroid Function Tests: No results for input(s): "TSH", "T4TOTAL", "FREET4", "T3FREE", "THYROIDAB" in the last 72 hours. Anemia Panel: No results for input(s): "VITAMINB12", "FOLATE", "FERRITIN", "TIBC", "IRON", "RETICCTPCT" in the last 72 hours. Urine analysis:    Component Value Date/Time   COLORURINE YELLOW 12/06/2021 1133   APPEARANCEUR CLEAR 12/06/2021 1133   LABSPEC 1.012 12/06/2021 1133   PHURINE 6.0 12/06/2021 1133   GLUCOSEU NEGATIVE 12/06/2021 1133   HGBUR NEGATIVE 12/06/2021 1133   BILIRUBINUR NEGATIVE 12/06/2021 1133   KETONESUR NEGATIVE 12/06/2021 1133   PROTEINUR NEGATIVE 12/06/2021 1133   UROBILINOGEN 1.0  06/03/2014 2035   NITRITE NEGATIVE 12/06/2021 1133   LEUKOCYTESUR NEGATIVE 12/06/2021 1133   Sepsis Labs: @LABRCNTIP (procalcitonin:4,lacticidven:4) )No results found for this or any previous visit (from the past 240 hour(s)).   Radiological Exams on Admission: DG Chest 2 View  Result Date: 05/06/2022 CLINICAL DATA:  Chest  pain. EXAM: CHEST - 2 VIEW COMPARISON:  Chest radiograph dated 01/30/2022. FINDINGS: Background of emphysema. No focal consolidation, pleural effusion, or pneumothorax. The cardiac silhouette is within normal limits. Atherosclerotic calcification of the aorta. Median sternotomy wires. Osteopenia with degenerative changes of the spine and shoulders. No acute osseous pathology. IMPRESSION: No active cardiopulmonary disease. Electronically Signed   By: Elgie Collard M.D.   On: 05/06/2022 20:16    EKG: Independently reviewed. Sinus rhythm, diffuse T-wave abnormality.   Assessment/Plan   1. Chest pain; CAD  - Hx of CABG in January 2023  - Presents with 2 wks of atypical chest pains, worse at night, wakes her from sleep, no pain with exertion during the day, had negative serial troponin and unchanged EKG, no acute finding on CXR  - Cardiology recommends echocardiogram    2. Type II DM  - Continue CBG checks and insulin    3. HTN  - Hold losartan initially given increased creatinine, continue metoprolol     DVT prophylaxis: Lovenox  Code Status: Full  Level of Care: Level of care: Telemetry Cardiac Family Communication: Daughter at bedside  Disposition Plan:  Patient is from: home  Anticipated d/c is to: TBD Anticipated d/c date is: Possibly as early as 05/08/22  Patient currently: Pending echocardiogram  Consults called: None  Admission status: Observation     Briscoe Deutscher, MD [facility]  05/07/2022, 3:54 AM

## 2022-05-07 NOTE — ED Notes (Signed)
[facility] arrived to transport pt. Pt stable at time of departure ?

## 2022-05-07 NOTE — Discharge Summary (Addendum)
Physician Discharge Summary  [name] [id number] DOB: 1941/08/21 DOA: 05/06/2022  PCP: Janie Morning, DO  Admit date: 05/06/2022 Discharge date: 05/07/2022  Admitted From: Home Disposition:  Home  Discharge Condition:Stable CODE STATUS:FULL Diet recommendation: Heart Healthy   Brief/Interim Summary:  HPI: [name] [name] is an 81 y.o. female with medical history significant for rheumatoid arthritis, hypertension, insulin-dependent diabetes mellitus, and CAD with CABG in January 2023, now presenting to the emergency department for evaluation of chest pain.  Patient reports 2 weeks of intermittent chest pain that occurs at night, sometimes waking her from her sleep, and radiates towards the shoulder.  She has been taking nitroglycerin for this.  Pain intensity and frequency of episodes has increased over the past few days, prompting her daughter to call EMS on 05/02/2022 but the patient refused transport to the ED at that time.  She had 3 episodes the night of 05/05/2022 which is more than usual.  She called her cardiology clinic and was directed to the ED.  There is no associated shortness of breath or nausea but she has felt sweaty with some of the episodes.  She reports improvement with nitroglycerin.   [facility] ED Course: Upon arrival to the ED, patient is found to be afebrile and saturating well on room air with systolic blood pressures ranging from 98-189.  EKG features sinus rhythm with diffuse T wave abnormality.  Chest x-ray negative for acute cardiopulmonary disease.  Chemistry panel notable for creatinine 1.08.  High-sensitivity troponin was normal x2.  Patient continued to have episodes of pain in the emergency department, cardiology was consulted by the ED physician, patient was transferred to [facility] for admission, and cardiology recommended echocardiogram.   Hospital course: Patient's hospital course remained stable.  Chest pain was resolved during my  evaluation at the bedside this morning.  Vitals are stable.  She complained of chest pain when she moves after waking up from the bed.  Pain was not present when I examined her.  Examination was essentially normal.  Troponins have been negative.  Echocardiogram done during this hospitalization shows EF of 60 to 65%, no regional wall motion abnormality. Her chest pain is most likely secondary to musculoskeletal etiology or GERD.  We recommend to continue Protonix, she can take tylenol as needed.  I called her daughter and discussed about discharge plan.She is medically stable for discharge to home today.  I recommend her to follow-up with her PCP in a week and also her cardiologist as an outpatient. I will continue all her home medications.   Discharge Diagnoses:  Principal Problem:   Atypical chest pain Active Problems:   CAD (coronary artery disease)   Rheumatoid arthritis (HCC)   Insulin dependent type 2 diabetes mellitus (HCC)   HTN (hypertension)    Discharge Instructions  Discharge Instructions     Diet - low sodium heart healthy   Complete by: As directed    Discharge instructions   Complete by: As directed    1)Please follow up with your primary care physician within a week 2)We did a sonogram of your heart, your heart is functioning fine.  The chest pain is not coming from the heart.  We recommend to follow-up with a cardiologist as an outpatient   Increase activity slowly   Complete by: As directed       Allergies as of 05/07/2022       Reactions   Hydrocodone Nausea Only   Statins Other (See Comments)  Myalgia   Amaryl [glimepiride] Other (See Comments)   Unknown reaction   Avandia [rosiglitazone] Other (See Comments)   Arthralgia   Crestor [rosuvastatin] Other (See Comments)   Myalgia    Ultram [tramadol] Other (See Comments)   Hallucinations   Welchol [colesevelam] Other (See Comments)   Sore throat   Zestril [lisinopril] Cough   Zetia [ezetimibe] Other  (See Comments)   Myalgia    Januvia [sitagliptin] Rash   Metformin And Related Other (See Comments)   Arthralgia   Vibra-tab [doxycycline] Swelling, Rash   Eye swelling   Wound Dressing Adhesive Rash   Medical tape adhesive        Medication List     STOP taking these medications    traMADol 50 MG tablet Commonly known as: Ultram       TAKE these medications    Abatacept 125 MG/ML Sosy Inject 1 Dose into the skin every 30 (thirty) days.   acetaminophen 325 MG tablet Commonly known as: TYLENOL Take 2 tablets (650 mg total) by mouth every 6 (six) hours as needed for moderate pain, fever or headache. What changed: Another medication with the same name was added. Make sure you understand how and when to take each.   acetaminophen 500 MG tablet Commonly known as: TYLENOL Take 1 tablet (500 mg total) by mouth every 6 (six) hours as needed for moderate pain (chest pain). What changed: You were already taking a medication with the same name, and this prescription was added. Make sure you understand how and when to take each.   aspirin 81 MG tablet Take 1 tablet (81 mg total) by mouth daily.   B-D UF III MINI PEN NEEDLES 31G X 5 MM Misc Generic drug: Insulin Pen Needle USE WITH INSULIN TO INJECT BID UTD   folic acid 1 MG tablet Commonly known as: FOLVITE Take 1 mg by mouth every evening.   furosemide 40 MG tablet Commonly known as: LASIX Take 1 tablet (40 mg total) by mouth as needed for fluid (wt gain of 3 lbs in 24 hours.).   Insulin Lispro Prot & Lispro (75-25) 100 UNIT/ML Kwikpen Commonly known as: HUMALOG 75/25 MIX Inject 3-5 Units into the skin See admin instructions. Inject 5 units in the morning and 3 units in the evening if needed (high blood sugar)   isosorbide mononitrate 30 MG 24 hr tablet Commonly known as: IMDUR Take 2 tablets (60 mg total) by mouth daily.   losartan 25 MG tablet Commonly known as: COZAAR Take 1 tablet (25 mg total) by mouth  daily.   methotrexate 250 MG/10ML injection Inject 10 mg into the muscle every Sunday.   metoprolol tartrate 25 MG tablet Commonly known as: LOPRESSOR Take 0.5 tablets (12.5 mg total) by mouth 2 (two) times daily.   multivitamin with minerals Tabs tablet Take 1 tablet by mouth daily. Centrum silver   nitroGLYCERIN 0.4 MG SL tablet Commonly known as: NITROSTAT Place 1 tablet (0.4 mg total) under the tongue every 5 (five) minutes as needed for chest pain.   OneTouch Verio test strip Generic drug: glucose blood USE 1 STRIP TID UTD   pantoprazole 40 MG tablet Commonly known as: PROTONIX Take 40 mg by mouth daily.   potassium chloride 10 MEQ tablet Commonly known as: KLOR-CON Take 1 tablet (10 mEq total) by mouth as needed (wt gain of 3 lbs in 24 hours to be taken with Lasix).   Vitamin D-3 25 MCG (1000 UT) Caps Take 1,000 Units by  mouth daily.        Follow-up Information     Irena Reichmann, DO. Schedule an appointment as soon as possible for a visit in 1 week(s).   Specialty: Family Medicine Contact information: [address] [place] [address] [phone]                Allergies  Allergen Reactions   Hydrocodone Nausea Only   Statins Other (See Comments)    Myalgia   Amaryl [Glimepiride] Other (See Comments)    Unknown reaction   Avandia [Rosiglitazone] Other (See Comments)    Arthralgia   Crestor [Rosuvastatin] Other (See Comments)    Myalgia    Ultram [Tramadol] Other (See Comments)    Hallucinations    Welchol [Colesevelam] Other (See Comments)    Sore throat   Zestril [Lisinopril] Cough   Zetia [Ezetimibe] Other (See Comments)    Myalgia    Januvia [Sitagliptin] Rash   Metformin And Related Other (See Comments)    Arthralgia   Vibra-Tab [Doxycycline] Swelling and Rash    Eye swelling   Wound Dressing Adhesive Rash    Medical tape adhesive    Consultations: None   Procedures/Studies: ECHOCARDIOGRAM  LIMITED  Result Date: 05/07/2022    ECHOCARDIOGRAM REPORT   Patient Name:   JENDAYA GOSSETT [facility] Date of Exam: 05/07/2022 Medical Rec #:  [id number]       Height:       61.5 in Accession #:    [id number]      Weight:       131.5 lb Date of Birth:  Sep 06, 1941       BSA:          1.590 m Patient Age:    81 years        BP:           164/62 mmHg Patient Gender: F               HR:           77 bpm. Exam Location:  Inpatient Procedure: 2D Echo, Limited Echo and Limited Color Doppler Indications:    Chest pain  History:        Patient has prior history of Echocardiogram examinations. CAD;                 Risk Factors:Hypertension and Diabetes.  Sonographer:    Cleatis Polka Referring Phys: [id number] TIMOTHY S OPYD  Sonographer Comments: Limited echo due to pt refusing further images. IMPRESSIONS  1. Only parasternal views obtained; no apical or subcostal views as pt refused.  2. Left ventricular ejection fraction, by estimation, is 60 to 65%. The left ventricle has normal function. The left ventricle has no regional wall motion abnormalities. Left ventricular diastolic function could not be evaluated.  3. Right ventricular systolic function was not well visualized. The right ventricular size is not well visualized.  4. The mitral valve is normal in structure. Trivial mitral valve regurgitation. No evidence of mitral stenosis.  5. The aortic valve is tricuspid. Aortic valve regurgitation is not visualized. No aortic stenosis is present.  6. Mild pulmonic stenosis. FINDINGS  Left Ventricle: Left ventricular ejection fraction, by estimation, is 60 to 65%. The left ventricle has normal function. The left ventricle has no regional wall motion abnormalities. The left ventricular internal cavity size was normal in size. There is  no left ventricular hypertrophy. Left ventricular diastolic function could not be evaluated. Right Ventricle: The right ventricular size  is not well visualized. Right ventricular systolic function was not well  visualized. Left Atrium: Left atrial size was normal in size. Right Atrium: Right atrial size was normal in size. Pericardium: There is no evidence of pericardial effusion. Mitral Valve: The mitral valve is normal in structure. Trivial mitral valve regurgitation. No evidence of mitral valve stenosis. Tricuspid Valve: The tricuspid valve is normal in structure. Tricuspid valve regurgitation is mild . No evidence of tricuspid stenosis. Aortic Valve: The aortic valve is tricuspid. Aortic valve regurgitation is not visualized. No aortic stenosis is present. Pulmonic Valve: The pulmonic valve was grossly normal. Pulmonic valve regurgitation is not visualized. Mild pulmonic stenosis. Aorta: The aortic root is normal in size and structure. Ascending aorta measurements are within normal limits for age when indexed to body surface area. IAS/Shunts: The interatrial septum was not assessed. Additional Comments: Only parasternal views obtained; no apical or subcostal views as pt refused.  LEFT VENTRICLE PLAX 2D LVIDd:         4.00 cm LVIDs:         2.80 cm LV PW:         0.90 cm LV IVS:        0.70 cm LVOT diam:     1.80 cm LVOT Area:     2.54 cm  LEFT ATRIUM         Index LA diam:    3.70 cm 2.33 cm/m   AORTA Ao Root diam: 2.40 cm Ao Asc diam:  2.60 cm TRICUSPID VALVE TR Peak grad:   29.4 mmHg TR Vmax:        271.00 cm/s  SHUNTS Systemic Diam: 1.80 cm Olga Millers MD Electronically signed by Olga Millers MD Signature Date/Time: 05/07/2022/10:57:37 AM    Final    DG Chest 2 View  Result Date: 05/06/2022 CLINICAL DATA:  Chest pain. EXAM: CHEST - 2 VIEW COMPARISON:  Chest radiograph dated 01/30/2022. FINDINGS: Background of emphysema. No focal consolidation, pleural effusion, or pneumothorax. The cardiac silhouette is within normal limits. Atherosclerotic calcification of the aorta. Median sternotomy wires. Osteopenia with degenerative changes of the spine and shoulders. No acute osseous pathology. IMPRESSION: No active  cardiopulmonary disease. Electronically Signed   By: Elgie Collard M.D.   On: 05/06/2022 20:16   LONG TERM MONITOR (3-14 DAYS)  Result Date: 04/09/2022 Patch Wear Time:  14 days and 0 hours (2023-05-04T10:02:52-0400 to 2023-05-18T10:02:56-0400) Predominent rhythm:  Inus rhythm     HR  47 to 114 bpm   Average HR 73 bpm    Heart rates overall ranged from 47 to 153 bpm Rare PVCs, PACs.   SHort burst of SVT   Fastest was 17 beats at a maximal rate of 158 bpm     Not sensed Triggered event correlate with SR Patient had a min HR of 47 bpm, max HR of 158 bpm, and avg HR of 73 bpm. Predominant underlying rhythm was Sinus Rhythm. 6 Supraventricular Tachycardia runs occurred, the run with the fastest interval lasting 17 beats with a max rate of 158 bpm (avg 143 bpm); the run with the fastest interval was also the longest. Isolated SVEs were rare (<1.0%), SVE Couplets were rare (<1.0%), and SVE Triplets were rare (<1.0%). Isolated VEs were rare (<1.0%), VE Couplets were rare (<1.0%), and no VE Triplets were present. Ventricular Bigeminy and Trigeminy were present.      Subjective: Patient seen and examined at the bedside this morning.  Hemodynamically stable for discharge today.  Discharge Exam: Vitals:  05/07/22 0800 05/07/22 1138  BP: (!) 122/59 (!) 141/57  Pulse: 83 65  Resp: 20 19  Temp: (!) 97.3 F (36.3 C) (!) 97.2 F (36.2 C)  SpO2: 100% 100%   Vitals:   05/07/22 0500 05/07/22 0556 05/07/22 0800 05/07/22 1138  BP: (!) 179/84 (!) 164/62 (!) 122/59 (!) 141/57  Pulse: 84 (!) 57 83 65  Resp: (!) 21 17 20 19   Temp:   (!) 97.3 F (36.3 C) (!) 97.2 F (36.2 C)  TempSrc:   Axillary Axillary  SpO2:  100% 100% 100%  Weight:      Height:        General: Pt is alert, awake, not in acute distress Cardiovascular: RRR, S1/S2 +, no rubs, no gallops Respiratory: CTA bilaterally, no wheezing, no rhonchi Abdominal: Soft, NT, ND, bowel sounds + Extremities: no edema, no cyanosis    The  results of significant diagnostics from this hospitalization (including imaging, microbiology, ancillary and laboratory) are listed below for reference.     Microbiology: No results found for this or any previous visit (from the past 240 hour(s)).   Labs: BNP (last 3 results) No results for input(s): "BNP" in the last 8760 hours. Basic Metabolic Panel: Recent Labs  Lab 05/06/22 1958 05/07/22 0254  NA 138 138  K 4.0 3.7  CL 102 107  CO2 25 24  GLUCOSE 128* 136*  BUN 17 16  CREATININE 1.08* 1.04*  CALCIUM 10.2 9.2   Liver Function Tests: No results for input(s): "AST", "ALT", "ALKPHOS", "BILITOT", "PROT", "ALBUMIN" in the last 168 hours. No results for input(s): "LIPASE", "AMYLASE" in the last 168 hours. No results for input(s): "AMMONIA" in the last 168 hours. CBC: Recent Labs  Lab 05/06/22 1958 05/07/22 0254  WBC 5.6 5.7  HGB 12.5 11.4*  HCT 39.5 34.8*  MCV 89.2 89.9  PLT 242 217   Cardiac Enzymes: No results for input(s): "CKTOTAL", "CKMB", "CKMBINDEX", "TROPONINI" in the last 168 hours. BNP: Invalid input(s): "POCBNP" CBG: Recent Labs  Lab 05/07/22 0618 05/07/22 0801 05/07/22 1138  GLUCAP 113* 102* 237*   D-Dimer No results for input(s): "DDIMER" in the last 72 hours. Hgb A1c No results for input(s): "HGBA1C" in the last 72 hours. Lipid Profile No results for input(s): "CHOL", "HDL", "LDLCALC", "TRIG", "CHOLHDL", "LDLDIRECT" in the last 72 hours. Thyroid function studies No results for input(s): "TSH", "T4TOTAL", "T3FREE", "THYROIDAB" in the last 72 hours.  Invalid input(s): "FREET3" Anemia work up No results for input(s): "VITAMINB12", "FOLATE", "FERRITIN", "TIBC", "IRON", "RETICCTPCT" in the last 72 hours. Urinalysis    Component Value Date/Time   COLORURINE YELLOW 12/06/2021 1133   APPEARANCEUR CLEAR 12/06/2021 1133   LABSPEC 1.012 12/06/2021 1133   PHURINE 6.0 12/06/2021 1133   GLUCOSEU NEGATIVE 12/06/2021 1133   HGBUR NEGATIVE 12/06/2021  1133   BILIRUBINUR NEGATIVE 12/06/2021 1133   KETONESUR NEGATIVE 12/06/2021 1133   PROTEINUR NEGATIVE 12/06/2021 1133   UROBILINOGEN 1.0 06/03/2014 2035   NITRITE NEGATIVE 12/06/2021 1133   LEUKOCYTESUR NEGATIVE 12/06/2021 1133   Sepsis Labs Recent Labs  Lab 05/06/22 1958 05/07/22 0254  WBC 5.6 5.7   Microbiology No results found for this or any previous visit (from the past 240 hour(s)).  Please note: You were cared for by a hospitalist during your hospital stay. Once you are discharged, your primary care physician will handle any further medical issues. Please note that NO REFILLS for any discharge medications will be authorized once you are discharged, as it is imperative that you  return to your primary care physician (or establish a relationship with a primary care physician if you do not have one) for your post hospital discharge needs so that they can reassess your need for medications and monitor your lab values.    Time coordinating discharge: 40 minutes  SIGNED:   Shelly Coss, MD  [facility] 05/07/2022, 3:19 PM Pager [phone]  If 7PM-7AM, please contact night-coverage [email or web address] Password [email or web address]

## 2022-05-07 NOTE — Progress Notes (Signed)
Mobility Specialist: Progress Note   05/07/22 1213  Mobility  Activity Ambulated independently in hallway  Level of Assistance Independent  Assistive Device None  Distance Ambulated (ft) 320 ft  Activity Response Tolerated well  $Mobility charge 1 Mobility   Pre-Mobility: 77 HR, 100% SpO2 Post-Mobility: 84 HR, 187/62 (99) BP, 100% SpO2  Received pt in chair having no complaints and agreeable to mobility. Pt was asymptomatic throughout ambulation and returned to room w/o fault. Left in chair w/ call bell in reach and all needs met.  [facility] [name] [name] Mobility Specialist Mobility Specialist [address]: [phone]

## 2022-05-07 NOTE — ED Notes (Signed)
Called to give report. Nurse unable to take report at this time. Callback number given.

## 2022-05-08 ENCOUNTER — Ambulatory Visit (INDEPENDENT_AMBULATORY_CARE_PROVIDER_SITE_OTHER): Payer: Medicare PPO | Admitting: Pharmacist

## 2022-05-08 DIAGNOSIS — E7800 Pure hypercholesterolemia, unspecified: Secondary | ICD-10-CM

## 2022-05-08 DIAGNOSIS — I779 Disorder of arteries and arterioles, unspecified: Secondary | ICD-10-CM

## 2022-05-08 DIAGNOSIS — Z789 Other specified health status: Secondary | ICD-10-CM

## 2022-05-12 ENCOUNTER — Encounter: Payer: Self-pay | Admitting: Cardiovascular Disease

## 2022-05-12 ENCOUNTER — Ambulatory Visit: Payer: Medicare PPO | Admitting: Cardiovascular Disease

## 2022-05-12 VITALS — BP 118/62 | HR 85 | Ht 61.5 in | Wt 130.0 lb

## 2022-05-12 DIAGNOSIS — I200 Unstable angina: Secondary | ICD-10-CM | POA: Diagnosis not present
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
## 2022-05-18 ENCOUNTER — Telehealth: Payer: Self-pay | Admitting: *Deleted

## 2022-05-18 NOTE — Telephone Encounter (Signed)
Cardiac Catheterization scheduled at [facility] for: Wednesday May 20, 2022 8:30 AM Arrival time and place: [facility] Main Entrance A at: 6:30 AM   Nothing to eat after midnight prior to procedure, clear liquids until 5 AM day of procedure.  Medication instructions: -Hold:  Insulin-AM of procedure/1/2 usual dose HS prior to procedure  Lasix-KCl-AM of procedure -Except hold medications usual morning medications can be taken with sips of water including aspirin 81 mg.  Confirmed patient has responsible adult to drive home post procedure and be with patient first 24 hours after arriving home.  Patient reports no new symptoms concerning for COVID-19 in the past 10 days.  Reviewed procedure instructions with patient.

## 2022-05-20 ENCOUNTER — Ambulatory Visit (HOSPITAL_COMMUNITY)
Admission: RE | Disposition: A | Discharge status: Home / Self Care | Payer: Self-pay | Source: Ambulatory Visit | Attending: Cardiology

## 2022-05-20 ENCOUNTER — Inpatient Hospital Stay (HOSPITAL_COMMUNITY)
Admission: RE | Admit: 2022-05-20 | Discharge: 2022-05-23 | DRG: 247 | Disposition: A | Discharge status: Home / Self Care | Payer: Medicare PPO | Source: Ambulatory Visit | Attending: Cardiology | Admitting: Cardiology

## 2022-05-20 ENCOUNTER — Observation Stay (HOSPITAL_COMMUNITY): Payer: Medicare PPO

## 2022-05-20 ENCOUNTER — Other Ambulatory Visit: Payer: Self-pay

## 2022-05-20 DIAGNOSIS — Z87891 Personal history of nicotine dependence: Secondary | ICD-10-CM

## 2022-05-20 DIAGNOSIS — R079 Chest pain, unspecified: Secondary | ICD-10-CM | POA: Diagnosis not present

## 2022-05-20 DIAGNOSIS — M069 Rheumatoid arthritis, unspecified: Secondary | ICD-10-CM | POA: Diagnosis present

## 2022-05-20 DIAGNOSIS — I25110 Atherosclerotic heart disease of native coronary artery with unstable angina pectoris: Principal | ICD-10-CM | POA: Diagnosis present

## 2022-05-20 DIAGNOSIS — I2510 Atherosclerotic heart disease of native coronary artery without angina pectoris: Secondary | ICD-10-CM | POA: Diagnosis present

## 2022-05-20 DIAGNOSIS — Z951 Presence of aortocoronary bypass graft: Secondary | ICD-10-CM | POA: Diagnosis not present

## 2022-05-20 DIAGNOSIS — I959 Hypotension, unspecified: Secondary | ICD-10-CM | POA: Diagnosis present

## 2022-05-20 DIAGNOSIS — E785 Hyperlipidemia, unspecified: Secondary | ICD-10-CM | POA: Diagnosis present

## 2022-05-20 DIAGNOSIS — Z79899 Other long term (current) drug therapy: Secondary | ICD-10-CM

## 2022-05-20 DIAGNOSIS — Z833 Family history of diabetes mellitus: Secondary | ICD-10-CM

## 2022-05-20 DIAGNOSIS — Z90710 Acquired absence of both cervix and uterus: Secondary | ICD-10-CM | POA: Diagnosis not present

## 2022-05-20 DIAGNOSIS — Z794 Long term (current) use of insulin: Secondary | ICD-10-CM | POA: Diagnosis not present

## 2022-05-20 DIAGNOSIS — Z8249 Family history of ischemic heart disease and other diseases of the circulatory system: Secondary | ICD-10-CM | POA: Diagnosis not present

## 2022-05-20 DIAGNOSIS — M791 Myalgia, unspecified site: Secondary | ICD-10-CM | POA: Diagnosis not present

## 2022-05-20 DIAGNOSIS — Z7982 Long term (current) use of aspirin: Secondary | ICD-10-CM

## 2022-05-20 DIAGNOSIS — I200 Unstable angina: Secondary | ICD-10-CM | POA: Diagnosis not present

## 2022-05-20 DIAGNOSIS — I252 Old myocardial infarction: Secondary | ICD-10-CM | POA: Diagnosis not present

## 2022-05-20 DIAGNOSIS — E119 Type 2 diabetes mellitus without complications: Secondary | ICD-10-CM | POA: Diagnosis present

## 2022-05-20 DIAGNOSIS — I10 Essential (primary) hypertension: Secondary | ICD-10-CM | POA: Diagnosis not present

## 2022-05-20 DIAGNOSIS — E782 Mixed hyperlipidemia: Secondary | ICD-10-CM | POA: Diagnosis not present

## 2022-05-20 DIAGNOSIS — Z955 Presence of coronary angioplasty implant and graft: Principal | ICD-10-CM

## 2022-05-20 DIAGNOSIS — T466X5A Adverse effect of antihyperlipidemic and antiarteriosclerotic drugs, initial encounter: Secondary | ICD-10-CM | POA: Diagnosis not present

## 2022-05-20 HISTORY — PX: LEFT HEART CATH AND CORS/GRAFTS ANGIOGRAPHY: CATH118250

## 2022-05-20 LAB — COMPREHENSIVE METABOLIC PANEL
ALT: 14 U/L (ref 0–44)
AST: 21 U/L (ref 15–41)
Albumin: 3.4 g/dL — ABNORMAL LOW (ref 3.5–5.0)
Alkaline Phosphatase: 51 U/L (ref 38–126)
Anion gap: 11 (ref 5–15)
BUN: 14 mg/dL (ref 8–23)
CO2: 24 mmol/L (ref 22–32)
Calcium: 9.2 mg/dL (ref 8.9–10.3)
Chloride: 105 mmol/L (ref 98–111)
Creatinine, Ser: 0.93 mg/dL (ref 0.44–1.00)
GFR, Estimated: >60 mL/min (ref >60)
Glucose, Bld: 117 mg/dL — ABNORMAL HIGH (ref 70–99)
Potassium: 4.4 mmol/L (ref 3.5–5.1)
Sodium: 140 mmol/L (ref 135–145)
Total Bilirubin: 0.9 mg/dL (ref 0.3–1.2)
Total Protein: 6.6 g/dL (ref 6.5–8.1)

## 2022-05-20 LAB — CBC
HCT: 37.6 % (ref 36.0–46.0)
Hemoglobin: 12 g/dL (ref 12.0–15.0)
MCH: 29.1 pg (ref 26.0–34.0)
MCHC: 31.9 g/dL (ref 30.0–36.0)
MCV: 91 fL (ref 80.0–100.0)
Platelets: 226 10*3/uL (ref 150–400)
RBC: 4.13 MIL/uL (ref 3.87–5.11)
RDW: 16.2 % — ABNORMAL HIGH (ref 11.5–15.5)
WBC: 6.1 10*3/uL (ref 4.0–10.5)
nRBC: 0 % (ref 0.0–0.2)

## 2022-05-20 LAB — GLUCOSE, CAPILLARY
Glucose-Capillary: 96 mg/dL (ref 70–99)
Glucose-Capillary: 80 mg/dL (ref 70–99)
Glucose-Capillary: 134 mg/dL — ABNORMAL HIGH (ref 70–99)
Glucose-Capillary: 92 mg/dL (ref 70–99)
Glucose-Capillary: 146 mg/dL — ABNORMAL HIGH (ref 70–99)

## 2022-05-20 SURGERY — LEFT HEART CATH AND CORS/GRAFTS ANGIOGRAPHY
Anesthesia: LOCAL

## 2022-05-20 MED ORDER — ONDANSETRON HCL 4 MG/2ML IJ SOLN: 4.0000 mg | Freq: Four times a day (QID) | INTRAMUSCULAR | Status: DC | PRN

## 2022-05-20 MED ORDER — ASPIRIN 81 MG PO CHEW: 81.0000 mg | CHEWABLE_TABLET | ORAL | Status: DC

## 2022-05-20 MED ORDER — ONDANSETRON HCL 4 MG/2ML IJ SOLN
INTRAMUSCULAR | Status: DC | PRN
  Administered 2022-05-20: 4 mg via INTRAVENOUS

## 2022-05-20 MED ORDER — ASPIRIN 81 MG PO CHEW
81.0000 mg | CHEWABLE_TABLET | Freq: Every day | ORAL | Status: DC
Start: 2022-05-21 — End: 2022-05-23
  Administered 2022-05-22 – 2022-05-23 (×2): 81 mg via ORAL
  Filled 2022-05-20 (×2): qty 1

## 2022-05-20 MED ORDER — HEPARIN (PORCINE) IN NACL 1000-0.9 UT/500ML-% IV SOLN
INTRAVENOUS | Status: DC | PRN
  Administered 2022-05-20: 500 mL

## 2022-05-20 MED ORDER — MORPHINE SULFATE (PF) 2 MG/ML IV SOLN
INTRAVENOUS | Status: AC
  Filled 2022-05-20: qty 1

## 2022-05-20 MED ORDER — SODIUM CHLORIDE 0.9 % WEIGHT BASED INFUSION
3.0000 mL/kg/h | INTRAVENOUS | Status: DC
Start: 2022-05-20 — End: 2022-05-20
  Administered 2022-05-20: 3 mL/kg/h via INTRAVENOUS

## 2022-05-20 MED ORDER — SODIUM CHLORIDE 0.9% FLUSH: 3.0000 mL | INTRAVENOUS | Status: DC | PRN

## 2022-05-20 MED ORDER — SODIUM CHLORIDE 0.9 % IV SOLN: 250.0000 mL | INTRAVENOUS | Status: DC | PRN

## 2022-05-20 MED ORDER — ONDANSETRON HCL 4 MG/2ML IJ SOLN
INTRAMUSCULAR | Status: AC
  Filled 2022-05-20: qty 2

## 2022-05-20 MED ORDER — NITROGLYCERIN IN D5W 200-5 MCG/ML-% IV SOLN: 0.0000 ug/min | INTRAVENOUS | Status: DC

## 2022-05-20 MED ORDER — NITROGLYCERIN 0.4 MG/SPRAY TL SOLN
Status: AC
  Filled 2022-05-20: qty 4.9

## 2022-05-20 MED ORDER — SODIUM CHLORIDE 0.9% FLUSH: 3.0000 mL | Freq: Two times a day (BID) | INTRAVENOUS | Status: DC

## 2022-05-20 MED ORDER — NITROGLYCERIN IN D5W 200-5 MCG/ML-% IV SOLN
0.0000 ug/min | INTRAVENOUS | Status: DC
  Administered 2022-05-20: 5 ug/min via INTRAVENOUS

## 2022-05-20 MED ORDER — NITROGLYCERIN 0.4 MG SL SUBL
0.4000 mg | SUBLINGUAL_TABLET | SUBLINGUAL | Status: DC | PRN
  Administered 2022-05-20 (×2): 0.4 mg via SUBLINGUAL
  Filled 2022-05-20 (×2): qty 1

## 2022-05-20 MED ORDER — SODIUM CHLORIDE 0.9 % WEIGHT BASED INFUSION: 1.0000 mL/kg/h | INTRAVENOUS | Status: DC

## 2022-05-20 MED ORDER — ACETAMINOPHEN 325 MG PO TABS: 650.0000 mg | ORAL_TABLET | Freq: Four times a day (QID) | ORAL | Status: DC | PRN

## 2022-05-20 MED ORDER — LOSARTAN POTASSIUM 25 MG PO TABS
25.0000 mg | ORAL_TABLET | Freq: Every day | ORAL | Status: DC
Start: 2022-05-20 — End: 2022-05-22
  Administered 2022-05-20 – 2022-05-21 (×2): 25 mg via ORAL
  Filled 2022-05-20 (×3): qty 1

## 2022-05-20 MED ORDER — SODIUM CHLORIDE 0.9 % WEIGHT BASED INFUSION
3.0000 mL/kg/h | INTRAVENOUS | Status: DC
  Administered 2022-05-21: 3 mL/kg/h via INTRAVENOUS

## 2022-05-20 MED ORDER — ENOXAPARIN SODIUM 40 MG/0.4ML IJ SOSY
40.0000 mg | PREFILLED_SYRINGE | INTRAMUSCULAR | Status: DC
  Administered 2022-05-20 – 2022-05-22 (×2): 40 mg via SUBCUTANEOUS
  Filled 2022-05-20 (×2): qty 0.4

## 2022-05-20 MED ORDER — SODIUM CHLORIDE 0.9 % IV SOLN: INTRAVENOUS | Status: DC

## 2022-05-20 MED ORDER — NITROGLYCERIN IN D5W 200-5 MCG/ML-% IV SOLN
INTRAVENOUS | Status: AC
  Filled 2022-05-20: qty 250

## 2022-05-20 MED ORDER — MORPHINE SULFATE (PF) 2 MG/ML IV SOLN
2.0000 mg | INTRAVENOUS | Status: DC | PRN
  Administered 2022-05-20 – 2022-05-21 (×2): 2 mg via INTRAVENOUS

## 2022-05-20 MED ORDER — INSULIN ASPART 100 UNIT/ML IJ SOLN
0.0000 [IU] | Freq: Three times a day (TID) | INTRAMUSCULAR | Status: DC
  Administered 2022-05-22 (×2): 2 [IU] via SUBCUTANEOUS

## 2022-05-20 MED ORDER — SODIUM CHLORIDE 0.9 % WEIGHT BASED INFUSION
1.0000 mL/kg/h | INTRAVENOUS | Status: DC
  Administered 2022-05-21: 1 mL/kg/h via INTRAVENOUS

## 2022-05-20 MED ORDER — METOPROLOL TARTRATE 12.5 MG HALF TABLET
12.5000 mg | ORAL_TABLET | Freq: Two times a day (BID) | ORAL | Status: DC
  Administered 2022-05-20 – 2022-05-23 (×5): 12.5 mg via ORAL
  Filled 2022-05-20 (×6): qty 1

## 2022-05-20 MED ORDER — ISOSORBIDE MONONITRATE ER 60 MG PO TB24
60.0000 mg | ORAL_TABLET | Freq: Every day | ORAL | Status: DC
Start: 2022-05-21 — End: 2022-05-22
  Administered 2022-05-21: 60 mg via ORAL
  Filled 2022-05-20: qty 1

## 2022-05-20 MED ORDER — LIDOCAINE 5 % EX PTCH
1.0000 | MEDICATED_PATCH | CUTANEOUS | Status: DC
  Administered 2022-05-20 – 2022-05-21 (×2): 1 via TRANSDERMAL
  Filled 2022-05-20 (×2): qty 1

## 2022-05-20 MED ORDER — NITROGLYCERIN 0.4 MG SL SUBL
SUBLINGUAL_TABLET | SUBLINGUAL | Status: AC
  Administered 2022-05-20: 0.4 mg
  Filled 2022-05-20: qty 1

## 2022-05-20 SURGICAL SUPPLY — 7 items
GLIDESHEATH SLEND SS 6F .021 (SHEATH) IMPLANT
GUIDEWIRE INQWIRE 1.5J.035X260 (WIRE) IMPLANT
INQWIRE 1.5J .035X260CM (WIRE)
KIT HEART LEFT (KITS) ×2 IMPLANT
PACK CARDIAC CATHETERIZATION (CUSTOM PROCEDURE TRAY) ×2 IMPLANT
TRANSDUCER W/STOPCOCK (MISCELLANEOUS) ×2 IMPLANT
TUBING CIL FLEX 10 FLL-RA (TUBING) ×2 IMPLANT

## 2022-05-20 NOTE — Progress Notes (Addendum)
Pt woke from napping pre LHC, c/o CP 10/10, protocol followed, 2 nitro/SL given 5 min apart with pain currently 0/10. 0934 CBG 80 0958 pain 0/10

## 2022-05-20 NOTE — Interval H&P Note (Signed)
History and Physical Interval Note:  05/20/2022 10:34 AM  [name] [name]  has presented today for surgery, with the diagnosis of unstable angina-potentially angina decubitus..  The various methods of treatment have been discussed with the patient and family. After consideration of risks, benefits and other options for treatment, the patient has consented to  Procedure(s): LEFT HEART CATH AND CORS/GRAFTS ANGIOGRAPHY (N/A)  Percutaneous coronary intervention  as a surgical intervention.  The patient's history has been reviewed, patient examined, no change in status, stable for surgery.  I have reviewed the patient's chart and labs.  Questions were answered to the patient's satisfaction.     Upon arrival to the cardiac catheterization lab the patient had already had been having significant 10 out of 10 chest pain and rib pain radiating across the shoulders.  She is extremely nauseated.  Unable to lie flat.  Became very tearful and nauseated to the point of of emesis.  At this point I do not feel it is safe to continue attempting this procedure until she is stabilized and we determine the etiology of her chest pain.  It is possible that this is angina decubitus but very atypical type of symptom.  She needs to be able lie flat safely in order to perform cardiac catheterization procedure on a graft patient. We will temporarily admit her to the hospital to stabilize and reassess prior to rescheduling cardiac catheterization during this hospitalization.  Her home medications will be continued and we will titrate pain medications accordingly.  She may require 2D echo to assess volume status, although it does not appear that she is having orthopnea.  BP 117/63   Pulse 71   Temp 98.1 F (36.7 C) (Temporal)   Resp 16   Ht 5' 1.5" (1.562 m)   Wt 56.7 kg   SpO2 96%   BMI 23.24 kg/m  General appearance: alert, cooperative, severe distress, and somewhat agitated and uncomfortable Neck: no JVD and supple,  symmetrical, trachea midline Lungs: Nonlabored breathing. Heart: RRR. Extremities: extremities normal, atraumatic, no cyanosis or edema Pulses: 2+ and symmetric Neurologic: Mental status: Alert, oriented, thought content appropriate, in extreme pain nauseated and emesis during exam.    Bryan Lemma

## 2022-05-21 ENCOUNTER — Other Ambulatory Visit: Payer: Medicare PPO

## 2022-05-21 ENCOUNTER — Inpatient Hospital Stay (HOSPITAL_COMMUNITY)
Admission: RE | Disposition: A | Discharge status: Home / Self Care | Payer: Self-pay | Source: Ambulatory Visit | Attending: Cardiology

## 2022-05-21 DIAGNOSIS — Z8249 Family history of ischemic heart disease and other diseases of the circulatory system: Secondary | ICD-10-CM | POA: Diagnosis not present

## 2022-05-21 DIAGNOSIS — I959 Hypotension, unspecified: Secondary | ICD-10-CM | POA: Diagnosis present

## 2022-05-21 DIAGNOSIS — I252 Old myocardial infarction: Secondary | ICD-10-CM | POA: Diagnosis not present

## 2022-05-21 DIAGNOSIS — I200 Unstable angina: Secondary | ICD-10-CM | POA: Diagnosis not present

## 2022-05-21 DIAGNOSIS — Z951 Presence of aortocoronary bypass graft: Secondary | ICD-10-CM | POA: Diagnosis not present

## 2022-05-21 DIAGNOSIS — I25110 Atherosclerotic heart disease of native coronary artery with unstable angina pectoris: Secondary | ICD-10-CM

## 2022-05-21 DIAGNOSIS — Z87891 Personal history of nicotine dependence: Secondary | ICD-10-CM | POA: Diagnosis not present

## 2022-05-21 DIAGNOSIS — E785 Hyperlipidemia, unspecified: Secondary | ICD-10-CM | POA: Diagnosis present

## 2022-05-21 DIAGNOSIS — M791 Myalgia, unspecified site: Secondary | ICD-10-CM

## 2022-05-21 DIAGNOSIS — E782 Mixed hyperlipidemia: Secondary | ICD-10-CM | POA: Diagnosis not present

## 2022-05-21 DIAGNOSIS — Z79899 Other long term (current) drug therapy: Secondary | ICD-10-CM | POA: Diagnosis not present

## 2022-05-21 DIAGNOSIS — I10 Essential (primary) hypertension: Secondary | ICD-10-CM

## 2022-05-21 DIAGNOSIS — I2510 Atherosclerotic heart disease of native coronary artery without angina pectoris: Secondary | ICD-10-CM | POA: Diagnosis present

## 2022-05-21 DIAGNOSIS — E119 Type 2 diabetes mellitus without complications: Secondary | ICD-10-CM | POA: Diagnosis present

## 2022-05-21 DIAGNOSIS — Z833 Family history of diabetes mellitus: Secondary | ICD-10-CM | POA: Diagnosis not present

## 2022-05-21 DIAGNOSIS — Z794 Long term (current) use of insulin: Secondary | ICD-10-CM | POA: Diagnosis not present

## 2022-05-21 DIAGNOSIS — Z90710 Acquired absence of both cervix and uterus: Secondary | ICD-10-CM | POA: Diagnosis not present

## 2022-05-21 DIAGNOSIS — T466X5A Adverse effect of antihyperlipidemic and antiarteriosclerotic drugs, initial encounter: Secondary | ICD-10-CM

## 2022-05-21 DIAGNOSIS — Z7982 Long term (current) use of aspirin: Secondary | ICD-10-CM | POA: Diagnosis not present

## 2022-05-21 DIAGNOSIS — M069 Rheumatoid arthritis, unspecified: Secondary | ICD-10-CM | POA: Diagnosis present

## 2022-05-21 HISTORY — PX: LEFT HEART CATH AND CORS/GRAFTS ANGIOGRAPHY: CATH118250

## 2022-05-21 LAB — CBC
HCT: 34.8 % — ABNORMAL LOW (ref 36.0–46.0)
Hemoglobin: 11 g/dL — ABNORMAL LOW (ref 12.0–15.0)
MCH: 29.1 pg (ref 26.0–34.0)
MCHC: 31.6 g/dL (ref 30.0–36.0)
MCV: 92.1 fL (ref 80.0–100.0)
Platelets: 200 10*3/uL (ref 150–400)
RBC: 3.78 MIL/uL — ABNORMAL LOW (ref 3.87–5.11)
RDW: 16.5 % — ABNORMAL HIGH (ref 11.5–15.5)
WBC: 6.2 10*3/uL (ref 4.0–10.5)
nRBC: 0 % (ref 0.0–0.2)

## 2022-05-21 LAB — BASIC METABOLIC PANEL
Anion gap: 8 (ref 5–15)
BUN: 13 mg/dL (ref 8–23)
CO2: 24 mmol/L (ref 22–32)
Calcium: 8.9 mg/dL (ref 8.9–10.3)
Chloride: 105 mmol/L (ref 98–111)
Creatinine, Ser: 1.21 mg/dL — ABNORMAL HIGH (ref 0.44–1.00)
GFR, Estimated: 45 mL/min — ABNORMAL LOW (ref >60)
Glucose, Bld: 204 mg/dL — ABNORMAL HIGH (ref 70–99)
Potassium: 4.4 mmol/L (ref 3.5–5.1)
Sodium: 137 mmol/L (ref 135–145)

## 2022-05-21 LAB — POCT ACTIVATED CLOTTING TIME
Activated Clotting Time: 257 seconds
Activated Clotting Time: 227 seconds
Activated Clotting Time: 197 seconds
Activated Clotting Time: 191 seconds
Activated Clotting Time: 197 seconds

## 2022-05-21 LAB — GLUCOSE, CAPILLARY
Glucose-Capillary: 102 mg/dL — ABNORMAL HIGH (ref 70–99)
Glucose-Capillary: 107 mg/dL — ABNORMAL HIGH (ref 70–99)
Glucose-Capillary: 77 mg/dL (ref 70–99)
Glucose-Capillary: 116 mg/dL — ABNORMAL HIGH (ref 70–99)

## 2022-05-21 SURGERY — LEFT HEART CATH AND CORS/GRAFTS ANGIOGRAPHY
Anesthesia: LOCAL

## 2022-05-21 MED ORDER — SODIUM CHLORIDE 0.9 % IV SOLN: 250.0000 mL | INTRAVENOUS | Status: DC | PRN

## 2022-05-21 MED ORDER — HEPARIN (PORCINE) IN NACL 1000-0.9 UT/500ML-% IV SOLN
INTRAVENOUS | Status: AC
  Filled 2022-05-21: qty 1000

## 2022-05-21 MED ORDER — VERAPAMIL HCL 2.5 MG/ML IV SOLN
INTRAVENOUS | Status: AC
  Filled 2022-05-21: qty 2

## 2022-05-21 MED ORDER — HYDRALAZINE HCL 20 MG/ML IJ SOLN: 10.0000 mg | INTRAMUSCULAR | Status: AC | PRN

## 2022-05-21 MED ORDER — SODIUM CHLORIDE 0.9 % IV SOLN: INTRAVENOUS | Status: DC

## 2022-05-21 MED ORDER — LIDOCAINE HCL (PF) 1 % IJ SOLN
INTRAMUSCULAR | Status: DC | PRN
  Administered 2022-05-21: 2 mL
  Administered 2022-05-21: 15 mL

## 2022-05-21 MED ORDER — SODIUM CHLORIDE 0.9% FLUSH: 3.0000 mL | Freq: Two times a day (BID) | INTRAVENOUS | Status: DC

## 2022-05-21 MED ORDER — SODIUM CHLORIDE 0.9 % IV SOLN: INTRAVENOUS | Status: AC

## 2022-05-21 MED ORDER — NITROGLYCERIN 1 MG/10 ML FOR IR/CATH LAB
INTRA_ARTERIAL | Status: AC
  Filled 2022-05-21: qty 10

## 2022-05-21 MED ORDER — CLOPIDOGREL BISULFATE 75 MG PO TABS
75.0000 mg | ORAL_TABLET | Freq: Every day | ORAL | Status: DC
  Administered 2022-05-22 – 2022-05-23 (×2): 75 mg via ORAL
  Filled 2022-05-21 (×2): qty 1

## 2022-05-21 MED ORDER — FENTANYL CITRATE (PF) 100 MCG/2ML IJ SOLN
INTRAMUSCULAR | Status: AC
  Filled 2022-05-21: qty 2

## 2022-05-21 MED ORDER — CLOPIDOGREL BISULFATE 300 MG PO TABS
ORAL_TABLET | ORAL | Status: DC | PRN
  Administered 2022-05-21: 600 mg via ORAL

## 2022-05-21 MED ORDER — CLOPIDOGREL BISULFATE 300 MG PO TABS
ORAL_TABLET | ORAL | Status: AC
  Filled 2022-05-21: qty 2

## 2022-05-21 MED ORDER — HEPARIN SODIUM (PORCINE) 1000 UNIT/ML IJ SOLN
INTRAMUSCULAR | Status: AC
  Filled 2022-05-21: qty 10

## 2022-05-21 MED ORDER — SODIUM CHLORIDE 0.9% FLUSH
3.0000 mL | INTRAVENOUS | Status: DC | PRN
Start: 2022-05-21 — End: 2022-05-22

## 2022-05-21 MED ORDER — LABETALOL HCL 5 MG/ML IV SOLN: 10.0000 mg | INTRAVENOUS | Status: AC | PRN

## 2022-05-21 MED ORDER — FAMOTIDINE IN NACL 20-0.9 MG/50ML-% IV SOLN
INTRAVENOUS | Status: AC | PRN
  Administered 2022-05-21: 20 mg via INTRAVENOUS

## 2022-05-21 MED ORDER — FENTANYL CITRATE (PF) 100 MCG/2ML IJ SOLN
INTRAMUSCULAR | Status: DC | PRN
  Administered 2022-05-21 (×3): 25 ug via INTRAVENOUS

## 2022-05-21 MED ORDER — SODIUM CHLORIDE 0.9% FLUSH: 3.0000 mL | INTRAVENOUS | Status: DC | PRN

## 2022-05-21 MED ORDER — MIDAZOLAM HCL 2 MG/2ML IJ SOLN
INTRAMUSCULAR | Status: AC
  Filled 2022-05-21: qty 2

## 2022-05-21 MED ORDER — ASPIRIN 81 MG PO CHEW
81.0000 mg | CHEWABLE_TABLET | ORAL | Status: AC
  Administered 2022-05-21: 81 mg via ORAL
  Filled 2022-05-21: qty 1

## 2022-05-21 MED ORDER — VERAPAMIL HCL 2.5 MG/ML IV SOLN
INTRAVENOUS | Status: DC | PRN
  Administered 2022-05-21: 10 mL via INTRA_ARTERIAL

## 2022-05-21 MED ORDER — CLOPIDOGREL BISULFATE 75 MG PO TABS: 75.0000 mg | ORAL_TABLET | Freq: Every day | ORAL | Status: DC

## 2022-05-21 MED ORDER — MIDAZOLAM HCL 2 MG/2ML IJ SOLN
INTRAMUSCULAR | Status: DC | PRN
  Administered 2022-05-21 (×3): 1 mg via INTRAVENOUS

## 2022-05-21 MED ORDER — HEPARIN (PORCINE) IN NACL 1000-0.9 UT/500ML-% IV SOLN
INTRAVENOUS | Status: DC | PRN
  Administered 2022-05-21 (×2): 500 mL

## 2022-05-21 MED ORDER — FAMOTIDINE IN NACL 20-0.9 MG/50ML-% IV SOLN
INTRAVENOUS | Status: AC
  Filled 2022-05-21: qty 50

## 2022-05-21 MED ORDER — LIDOCAINE HCL (PF) 1 % IJ SOLN
INTRAMUSCULAR | Status: AC
  Filled 2022-05-21: qty 30

## 2022-05-21 MED ORDER — MORPHINE SULFATE (PF) 2 MG/ML IV SOLN
INTRAVENOUS | Status: AC
  Filled 2022-05-21: qty 1

## 2022-05-21 MED ORDER — HEPARIN SODIUM (PORCINE) 1000 UNIT/ML IJ SOLN
INTRAMUSCULAR | Status: DC | PRN
  Administered 2022-05-21: 3000 [IU] via INTRAVENOUS
  Administered 2022-05-21: 4000 [IU] via INTRAVENOUS
  Administered 2022-05-21: 2000 [IU] via INTRAVENOUS

## 2022-05-21 MED FILL — Nitroglycerin IV Soln 200 MCG/ML in D5W: INTRAVENOUS | Qty: 250 | Status: AC

## 2022-05-21 SURGICAL SUPPLY — 25 items
BALLN SAPPHIRE 2.0X12 (BALLOONS) ×2
BALLOON SAPPHIRE 2.0X12 (BALLOONS) IMPLANT
BAND ZEPHYR COMPRESS 30 LONG (HEMOSTASIS) ×1 IMPLANT
CATH INFINITI 5 FR IM (CATHETERS) ×1 IMPLANT
CATH INFINITI 5FR AL1 (CATHETERS) ×1 IMPLANT
CATH INFINITI 5FR MULTPACK ANG (CATHETERS) ×1 IMPLANT
CATH LAUNCHER 5F IMA (CATHETERS) IMPLANT
CATH LAUNCHER 5F JR4 (CATHETERS) ×1 IMPLANT
CATH LAUNCHER 6FR IMA (CATHETERS) ×1 IMPLANT
CATH VISTA GUIDE 6FR XBLAD3.5 (CATHETERS) ×1 IMPLANT
CATHETER LAUNCHER 5F IMA (CATHETERS) ×2
GLIDESHEATH SLEND SS 6F .021 (SHEATH) ×1 IMPLANT
GUIDEWIRE .025 260CM (WIRE) ×1 IMPLANT
GUIDEWIRE INQWIRE 1.5J.035X260 (WIRE) IMPLANT
INQWIRE 1.5J .035X260CM (WIRE) ×2
KIT ENCORE 26 ADVANTAGE (KITS) ×1 IMPLANT
KIT HEART LEFT (KITS) ×2 IMPLANT
KIT MICROPUNCTURE NIT STIFF (SHEATH) ×2 IMPLANT
PACK CARDIAC CATHETERIZATION (CUSTOM PROCEDURE TRAY) ×2 IMPLANT
SHEATH PINNACLE 6F 10CM (SHEATH) ×1 IMPLANT
SHEATH PROBE COVER 6X72 (BAG) ×1 IMPLANT
TRANSDUCER W/STOPCOCK (MISCELLANEOUS) ×2 IMPLANT
TUBING CIL FLEX 10 FLL-RA (TUBING) ×2 IMPLANT
WIRE COUGAR XT STRL 190CM (WIRE) ×1 IMPLANT
WIRE EMERALD 3MM-J .035X150CM (WIRE) ×1 IMPLANT

## 2022-05-21 NOTE — Progress Notes (Signed)
   05/21/22 2209  Assess: MEWS Score  BP (!) 83/46  Pulse Rate (!) 48  Assess: MEWS Score  MEWS Temp 0  MEWS Systolic 1  MEWS Pulse 1  MEWS RR 0  MEWS LOC 0  MEWS Score 2  MEWS Score Color Yellow  Assess: if the MEWS score is Yellow or Red  Were vital signs taken at a resting state? Yes  Focused Assessment Change from prior assessment (see assessment flowsheet)  Does the patient meet 2 or more of the SIRS criteria? No  MEWS guidelines implemented *See Row Information* No, vital signs rechecked  Treat  MEWS Interventions Escalated (See documentation below)  Pain Scale 0-10  Pain Score 0  Take Vital Signs  Increase Vital Sign Frequency  Yellow: Q 2hr X 2 then Q 4hr X 2, if remains yellow, continue Q 4hrs  Escalate  MEWS: Escalate Yellow: discuss with charge nurse/RN and consider discussing with provider and RRT  Notify: Charge Nurse/RN  Name of Charge Nurse/RN Notified Annabelle Harman RN  Date Charge Nurse/RN Notified 05/21/22  Time Charge Nurse/RN Notified 2211  Notify: Provider  Provider Name/Title Wilkie Aye md  Date Provider Notified 05/21/22  Time Provider Notified 2212  Method of Notification Call  Notification Reason Critical result (Decrease in BP)  Provider response  (Gice a bolus of 500 cc NS)  Date of Provider Response 05/21/22  Time of Provider Response 2214  Document  Patient Outcome Stabilized after interventions;Not stable and remains on department  Assess: SIRS CRITERIA  SIRS Temperature  0  SIRS Pulse 0  SIRS Respirations  0  SIRS WBC 0  SIRS Score Sum  0

## 2022-05-21 NOTE — Progress Notes (Signed)
Site area: rt groin Site Prior to Removal:  Level 0 Pressure Applied For: 25 minutes Manual:   yes Patient Status During Pull:  stable Post Pull Site:  Level 0 Post Pull Instructions Given:  yes Post Pull Pulses Present: rt dp palpable Dressing Applied:  gauze and tegaderm Bedrest begins @ 2045 Comments:

## 2022-05-21 NOTE — Progress Notes (Addendum)
Progress Note  Patient Name: [name] [name] Date of Encounter: 05/21/2022  [facility] Cardiologist: Kristeen Miss, MD   Subjective   Patient continues to have some mild shoulder pain, but it is significantly improved when compared to yesterday. Denies any chest pain or SOB. Has not had recurrent episodes of nausea/vomiting.  Is ready for her heart cath today.   Inpatient Medications    Scheduled Meds:  aspirin  81 mg Oral Daily   enoxaparin (LOVENOX) injection  40 mg Subcutaneous Q24H   insulin aspart  0-15 Units Subcutaneous TID WC   isosorbide mononitrate  60 mg Oral Daily   lidocaine  1 patch Transdermal Q24H   losartan  25 mg Oral Daily   metoprolol tartrate  12.5 mg Oral BID   sodium chloride flush  3 mL Intravenous Q12H   sodium chloride flush  3 mL Intravenous Q12H   Continuous Infusions:  sodium chloride 170 mL/hr at 05/21/22 0514   sodium chloride     sodium chloride 1 mL/kg/hr (05/21/22 0514)   nitroGLYCERIN 25 mcg/min (05/21/22 0809)   PRN Meds: sodium chloride, acetaminophen, morphine injection, nitroGLYCERIN, ondansetron (ZOFRAN) IV, sodium chloride flush   Vital Signs    Vitals:   05/21/22 0809 05/21/22 0811 05/21/22 0812 05/21/22 0841  BP:  (!) 166/71 (!) 166/71 (!) 146/63  Pulse:    (!) 56  Resp:      Temp:      TempSrc:      SpO2: 100%  100%   Weight:      Height:        Intake/Output Summary (Last 24 hours) at 05/21/2022 0937 Last data filed at 05/21/2022 0514 Gross per 24 hour  Intake 655.75 ml  Output --  Net 655.75 ml      05/21/2022    5:00 AM 05/20/2022    7:09 AM 05/12/2022    3:25 PM  Last 3 Weights  Weight (lbs) 131 lb 9.8 oz 125 lb 130 lb  Weight (kg) 59.7 kg 56.7 kg 58.968 kg      Telemetry    Sinus rhythm, HR in the 50s-60s. Intermittent ventricular bigeminy - Personally Reviewed  ECG     Sinus rhythm with sinus arrhythmia- Personally Reviewed  Physical Exam   GEN: Frail elderly female in no acute distress. Sitting  upright in the bed   Neck: No JVD Cardiac: RRR, no murmurs, rubs, or gallops. Radial pulses 2+ bilaterally.   Respiratory: Clear to auscultation bilaterally. GI: Soft, nontender, non-distended  MS: No edema; No deformity. Shoulders are nontender to palpation  Neuro:  Nonfocal  Psych: Normal affect   Labs    High Sensitivity Troponin:   Recent Labs  Lab 05/06/22 1958 05/06/22 2159 05/07/22 0254  TROPONINIHS 7 8 12      Chemistry Recent Labs  Lab 05/20/22 1529 05/21/22 0038  NA 140 137  K 4.4 4.4  CL 105 105  CO2 24 24  GLUCOSE 117* 204*  BUN 14 13  CREATININE 0.93 1.21*  CALCIUM 9.2 8.9  PROT 6.6  --   ALBUMIN 3.4*  --   AST 21  --   ALT 14  --   ALKPHOS 51  --   BILITOT 0.9  --   GFRNONAA >60 45*  ANIONGAP 11 8    Lipids No results for input(s): "CHOL", "TRIG", "HDL", "LABVLDL", "LDLCALC", "CHOLHDL" in the last 168 hours.  Hematology Recent Labs  Lab 05/20/22 1529 05/21/22 0038  WBC 6.1 6.2  RBC  4.13 3.78*  HGB 12.0 11.0*  HCT 37.6 34.8*  MCV 91.0 92.1  MCH 29.1 29.1  MCHC 31.9 31.6  RDW 16.2* 16.5*  PLT 226 200   Thyroid No results for input(s): "TSH", "FREET4" in the last 168 hours.  BNPNo results for input(s): "BNP", "PROBNP" in the last 168 hours.  DDimer No results for input(s): "DDIMER" in the last 168 hours.   Radiology    DG Chest 2 View  Result Date: 05/20/2022 CLINICAL DATA:  Chest pain in a 81-year-old female. EXAM: CHEST - 2 VIEW COMPARISON:  May 06, 2022. FINDINGS: Post median sternotomy for CABG. Cardiomediastinal contours and hilar structures are stable. Linear atelectasis at the LEFT lung base. No signs of effusion or consolidation. Lungs are hyperinflated. On limited assessment no acute skeletal findings with marked LEFT glenohumeral degenerative changes. IMPRESSION: 1. Post CABG changes. 2. Chronic hyperinflation, correlate with any signs of COPD. 3. No acute cardiopulmonary disease. Electronically Signed   By: Geoffrey  Wile M.D.    On: 05/20/2022 17:06    Cardiac Studies   Echocardiogram 05/07/22   1. Only parasternal views obtained; no apical or subcostal views as pt  refused.   2. Left ventricular ejection fraction, by estimation, is 60 to 65%. The  left ventricle has normal function. The left ventricle has no regional  wall motion abnormalities. Left ventricular diastolic function could not  be evaluated.   3. Right ventricular systolic function was not well visualized. The right  ventricular size is not well visualized.   4. The mitral valve is normal in structure. Trivial mitral valve  regurgitation. No evidence of mitral stenosis.   5. The aortic valve is tricuspid. Aortic valve regurgitation is not  visualized. No aortic stenosis is present.   6. Mild pulmonic stenosis.   Patient Profile     81 y.o. female with a past medical history of CAD, carotid artery disease, HLD, statin intolerance, rheumatoid arthritis.   Assessment & Plan    CAD s/p CABG 11/2021  - Patient underwent cath on 12/02/21 that showed 50% distal left main, 95% ostial LAD, 80% mid diag, 95% apical LAD, 50% prox circumflex, 95% RCA disease.  - Underwent CABG x3 with LIMA-LAD, SVG-OM, SVG-PL in 11/2021  - Has been followed by Dr. Nahser since then-- last seen on 6/27. At that appointment, patient reported that she was experiencing recurrent episodes of chest pain that were exactly like what she felt prior to her surgery  - Planned to undergo outpatient cardiac catheterization yesterday, but patient had severe left shoulder pain, N/V. Case was postponed until today  - Was given PRN morphine, lidocaine patch, and PRN tylenol for shoulder pain-- feels much better today  - PRN zofran for n/v-- also much improved today  - Creatinine slightly up to 1.21 today-- suspect some dehydration due to vomiting and decreased oral intake yesterday (was NPO for majority of day). Receiving IV fluids prior to cath  - Has been on nitro drip due to high BP--  discontinue today and attempt to get patient back on home medications  - Planned to undergo cardiac catheterization today - Continue metoprolol 12.5 mg BID  - Continue imdur 60 mg daily  - Continue ASA   HTN  - BP was elevated in the setting of shoulder pain, n/v prior to cath yesterday -- started on IV nitro  - DC nitro drip today and titrate home medications as needed for BP control  - Continue home losartan 25 mg daily  -   Continue home metoprolol 12.5 mg BID  - Continue imdur 60 mg daily   HLD  Statin Intolerance  - LDL 89, Triglycerides 37, HDL 73 in 11/2021  - May benefit from referral to lipid clinic at discharge    For questions or updates, please contact [facility] Please consult [email or web address] for contact info under        Signed, Jonita Albee, PA-C  05/21/2022, 9:37 AM     Personally seen and examined. Agree with APP above with the following comments:  Briefly 81 yo F with a history of CAD s/p recent CABG with SVG-OM; SVG-PL  Patient notes that her repeat angina has subsided (while on nitro drip) No further nausea or vomiting.  Her and her family have many questions about her   Exam notable for RRR, BP 130/70 on Late AM exam.  Rest as above.  Troponin not check at 7/5/ evaluation, WNL in June eval  Would recommend  - continue IV Nitro drip until [facility]; if not evidence of graft dysfunction and no intervention will transition to Imdur 120 mg and low dose CCB for BP control.  Likely 05/22/22 DC unless obstructive disease  Riley Lam, MD FASE Cardiologist [facility]  [address] [place], [address] [place], [place] [address] [phone]  12:13 PM

## 2022-05-21 NOTE — Interval H&P Note (Signed)
History and Physical Interval Note:  05/21/2022 1:33 PM  [name] [name]  has presented today for surgery, with the diagnosis of CAD.  The various methods of treatment have been discussed with the patient and family. After consideration of risks, benefits and other options for treatment, the patient has consented to  Procedure(s): LEFT HEART CATH AND CORS/GRAFTS ANGIOGRAPHY (N/A) as a surgical intervention.  The patient's history has been reviewed, patient examined, no change in status, stable for surgery.  I have reviewed the patient's chart and labs.  Questions were answered to the patient's satisfaction.    Cath Lab Visit (complete for each Cath Lab visit)  Clinical Evaluation Leading to the Procedure:   ACS: No.  Non-ACS:    Anginal Classification: CCS III  Anti-ischemic medical therapy: Maximal Therapy (2 or more classes of medications)  Non-Invasive Test Results: No non-invasive testing performed  Prior CABG: Previous CABG        Verne Carrow

## 2022-05-21 NOTE — Progress Notes (Signed)
Pt c/o left shoulder pain 8/10.  Titrated IV nitro up to and administered O2 4L via [place].  After a few minutes, she stated her left shoulder pain was almost gone .   Hinton Dyer, RN

## 2022-05-21 NOTE — H&P (View-Only) (Signed)
Progress Note  Patient Name: [name] [name] Date of Encounter: 05/21/2022  [facility] Cardiologist: Kristeen Miss, MD   Subjective   Patient continues to have some mild shoulder pain, but it is significantly improved when compared to yesterday. Denies any chest pain or SOB. Has not had recurrent episodes of nausea/vomiting.  Is ready for her heart cath today.   Inpatient Medications    Scheduled Meds:  aspirin  81 mg Oral Daily   enoxaparin (LOVENOX) injection  40 mg Subcutaneous Q24H   insulin aspart  0-15 Units Subcutaneous TID WC   isosorbide mononitrate  60 mg Oral Daily   lidocaine  1 patch Transdermal Q24H   losartan  25 mg Oral Daily   metoprolol tartrate  12.5 mg Oral BID   sodium chloride flush  3 mL Intravenous Q12H   sodium chloride flush  3 mL Intravenous Q12H   Continuous Infusions:  sodium chloride 170 mL/hr at 05/21/22 0514   sodium chloride     sodium chloride 1 mL/kg/hr (05/21/22 0514)   nitroGLYCERIN 25 mcg/min (05/21/22 0809)   PRN Meds: sodium chloride, acetaminophen, morphine injection, nitroGLYCERIN, ondansetron (ZOFRAN) IV, sodium chloride flush   Vital Signs    Vitals:   05/21/22 0809 05/21/22 0811 05/21/22 0812 05/21/22 0841  BP:  (!) 166/71 (!) 166/71 (!) 146/63  Pulse:    (!) 56  Resp:      Temp:      TempSrc:      SpO2: 100%  100%   Weight:      Height:        Intake/Output Summary (Last 24 hours) at 05/21/2022 0937 Last data filed at 05/21/2022 0514 Gross per 24 hour  Intake 655.75 ml  Output --  Net 655.75 ml      05/21/2022    5:00 AM 05/20/2022    7:09 AM 05/12/2022    3:25 PM  Last 3 Weights  Weight (lbs) 131 lb 9.8 oz 125 lb 130 lb  Weight (kg) 59.7 kg 56.7 kg 58.968 kg      Telemetry    Sinus rhythm, HR in the 50s-60s. Intermittent ventricular bigeminy - Personally Reviewed  ECG     Sinus rhythm with sinus arrhythmia- Personally Reviewed  Physical Exam   GEN: Frail elderly female in no acute distress. Sitting  upright in the bed   Neck: No JVD Cardiac: RRR, no murmurs, rubs, or gallops. Radial pulses 2+ bilaterally.   Respiratory: Clear to auscultation bilaterally. GI: Soft, nontender, non-distended  MS: No edema; No deformity. Shoulders are nontender to palpation  Neuro:  Nonfocal  Psych: Normal affect   Labs    High Sensitivity Troponin:   Recent Labs  Lab 05/06/22 1958 05/06/22 2159 05/07/22 0254  TROPONINIHS 7 8 12      Chemistry Recent Labs  Lab 05/20/22 1529 05/21/22 0038  NA 140 137  K 4.4 4.4  CL 105 105  CO2 24 24  GLUCOSE 117* 204*  BUN 14 13  CREATININE 0.93 1.21*  CALCIUM 9.2 8.9  PROT 6.6  --   ALBUMIN 3.4*  --   AST 21  --   ALT 14  --   ALKPHOS 51  --   BILITOT 0.9  --   GFRNONAA >60 45*  ANIONGAP 11 8    Lipids No results for input(s): "CHOL", "TRIG", "HDL", "LABVLDL", "LDLCALC", "CHOLHDL" in the last 168 hours.  Hematology Recent Labs  Lab 05/20/22 1529 05/21/22 0038  WBC 6.1 6.2  RBC  4.13 3.78*  HGB 12.0 11.0*  HCT 37.6 34.8*  MCV 91.0 92.1  MCH 29.1 29.1  MCHC 31.9 31.6  RDW 16.2* 16.5*  PLT 226 200   Thyroid No results for input(s): "TSH", "FREET4" in the last 168 hours.  BNPNo results for input(s): "BNP", "PROBNP" in the last 168 hours.  DDimer No results for input(s): "DDIMER" in the last 168 hours.   Radiology    DG Chest 2 View  Result Date: 05/20/2022 CLINICAL DATA:  Chest pain in a 81 year old female. EXAM: CHEST - 2 VIEW COMPARISON:  May 06, 2022. FINDINGS: Post median sternotomy for CABG. Cardiomediastinal contours and hilar structures are stable. Linear atelectasis at the LEFT lung base. No signs of effusion or consolidation. Lungs are hyperinflated. On limited assessment no acute skeletal findings with marked LEFT glenohumeral degenerative changes. IMPRESSION: 1. Post CABG changes. 2. Chronic hyperinflation, correlate with any signs of COPD. 3. No acute cardiopulmonary disease. Electronically Signed   By: Donzetta Kohut M.D.    On: 05/20/2022 17:06    Cardiac Studies   Echocardiogram 05/07/22   1. Only parasternal views obtained; no apical or subcostal views as pt  refused.   2. Left ventricular ejection fraction, by estimation, is 60 to 65%. The  left ventricle has normal function. The left ventricle has no regional  wall motion abnormalities. Left ventricular diastolic function could not  be evaluated.   3. Right ventricular systolic function was not well visualized. The right  ventricular size is not well visualized.   4. The mitral valve is normal in structure. Trivial mitral valve  regurgitation. No evidence of mitral stenosis.   5. The aortic valve is tricuspid. Aortic valve regurgitation is not  visualized. No aortic stenosis is present.   6. Mild pulmonic stenosis.   Patient Profile     81 y.o. female with a past medical history of CAD, carotid artery disease, HLD, statin intolerance, rheumatoid arthritis.   Assessment & Plan    CAD s/p CABG 11/2021  - Patient underwent cath on 12/02/21 that showed 50% distal left main, 95% ostial LAD, 80% mid diag, 95% apical LAD, 50% prox circumflex, 95% RCA disease.  - Underwent CABG x3 with LIMA-LAD, SVG-OM, SVG-PL in 11/2021  - Has been followed by Dr. Elease Hashimoto since then-- last seen on 6/27. At that appointment, patient reported that she was experiencing recurrent episodes of chest pain that were exactly like what she felt prior to her surgery  - Planned to undergo outpatient cardiac catheterization yesterday, but patient had severe left shoulder pain, N/V. Case was postponed until today  - Was given PRN morphine, lidocaine patch, and PRN tylenol for shoulder pain-- feels much better today  - PRN zofran for n/v-- also much improved today  - Creatinine slightly up to 1.21 today-- suspect some dehydration due to vomiting and decreased oral intake yesterday (was NPO for majority of day). Receiving IV fluids prior to cath  - Has been on nitro drip due to high BP--  discontinue today and attempt to get patient back on home medications  - Planned to undergo cardiac catheterization today - Continue metoprolol 12.5 mg BID  - Continue imdur 60 mg daily  - Continue ASA   HTN  - BP was elevated in the setting of shoulder pain, n/v prior to cath yesterday -- started on IV nitro  - DC nitro drip today and titrate home medications as needed for BP control  - Continue home losartan 25 mg daily  -  Continue home metoprolol 12.5 mg BID  - Continue imdur 60 mg daily   HLD  Statin Intolerance  - LDL 89, Triglycerides 37, HDL 73 in 11/2021  - May benefit from referral to lipid clinic at discharge    For questions or updates, please contact [facility] Please consult [email or web address] for contact info under        Signed, Jonita Albee, PA-C  05/21/2022, 9:37 AM     Personally seen and examined. Agree with APP above with the following comments:  Briefly 81 yo F with a history of CAD s/p recent CABG with SVG-OM; SVG-PL  Patient notes that her repeat angina has subsided (while on nitro drip) No further nausea or vomiting.  Her and her family have many questions about her   Exam notable for RRR, BP 130/70 on Late AM exam.  Rest as above.  Troponin not check at 7/5/ evaluation, WNL in June eval  Would recommend  - continue IV Nitro drip until [facility]; if not evidence of graft dysfunction and no intervention will transition to Imdur 120 mg and low dose CCB for BP control.  Likely 05/22/22 DC unless obstructive disease  Riley Lam, MD FASE Cardiologist [facility]  [address] [place], [address] [place], [place] [address] [phone]  12:13 PM

## 2022-05-21 NOTE — Progress Notes (Signed)
Patient had a heart catheterization done today. Shortly after arriving to the unit, the patients blood pressure dropped to 83/46. A manual was taken: 82/42. Patient is sleepy and responses to voice.15 mcg of nitroglycerin was stopped. Wilkie Aye MD was notified. Advised to give a 500 cc bolus of NS. Despite bolus the patient's BP is 90/42 (58). Patient denies chest pain at this time. Following up with Cardiology for new orders. Will continue to monitor patient for changes.

## 2022-05-21 NOTE — Progress Notes (Signed)
ACT 191; Dr. Clifton James notified, okay to remove sheath now.

## 2022-05-21 NOTE — Progress Notes (Signed)
States her back is feeling better

## 2022-05-21 NOTE — Progress Notes (Signed)
Pt is having bigeminy intermittently, but HR in 40s - 50s.  Held metoprolol for this morning.  Hinton Dyer, RN

## 2022-05-22 ENCOUNTER — Encounter (HOSPITAL_COMMUNITY)
Admission: RE | Disposition: A | Discharge status: Home / Self Care | Payer: Self-pay | Source: Ambulatory Visit | Attending: Cardiology

## 2022-05-22 ENCOUNTER — Encounter (HOSPITAL_COMMUNITY): Payer: Self-pay | Admitting: Cardiology

## 2022-05-22 DIAGNOSIS — I25110 Atherosclerotic heart disease of native coronary artery with unstable angina pectoris: Secondary | ICD-10-CM | POA: Diagnosis not present

## 2022-05-22 DIAGNOSIS — M791 Myalgia, unspecified site: Secondary | ICD-10-CM | POA: Diagnosis not present

## 2022-05-22 DIAGNOSIS — I10 Essential (primary) hypertension: Secondary | ICD-10-CM | POA: Diagnosis not present

## 2022-05-22 DIAGNOSIS — Z951 Presence of aortocoronary bypass graft: Secondary | ICD-10-CM | POA: Diagnosis not present

## 2022-05-22 HISTORY — PX: CORONARY STENT INTERVENTION: CATH118234

## 2022-05-22 LAB — BASIC METABOLIC PANEL
Anion gap: 8 (ref 5–15)
BUN: 8 mg/dL (ref 8–23)
CO2: 21 mmol/L — ABNORMAL LOW (ref 22–32)
Calcium: 8.6 mg/dL — ABNORMAL LOW (ref 8.9–10.3)
Chloride: 108 mmol/L (ref 98–111)
Creatinine, Ser: 0.9 mg/dL (ref 0.44–1.00)
GFR, Estimated: >60 mL/min (ref >60)
Glucose, Bld: 203 mg/dL — ABNORMAL HIGH (ref 70–99)
Potassium: 4.1 mmol/L (ref 3.5–5.1)
Sodium: 137 mmol/L (ref 135–145)

## 2022-05-22 LAB — CBC
HCT: 35.6 % — ABNORMAL LOW (ref 36.0–46.0)
Hemoglobin: 11.6 g/dL — ABNORMAL LOW (ref 12.0–15.0)
MCH: 29.8 pg (ref 26.0–34.0)
MCHC: 32.6 g/dL (ref 30.0–36.0)
MCV: 91.5 fL (ref 80.0–100.0)
Platelets: 209 10*3/uL (ref 150–400)
RBC: 3.89 MIL/uL (ref 3.87–5.11)
RDW: 16.6 % — ABNORMAL HIGH (ref 11.5–15.5)
WBC: 5.5 10*3/uL (ref 4.0–10.5)
nRBC: 0 % (ref 0.0–0.2)

## 2022-05-22 LAB — GLUCOSE, CAPILLARY
Glucose-Capillary: 146 mg/dL — ABNORMAL HIGH (ref 70–99)
Glucose-Capillary: 77 mg/dL (ref 70–99)
Glucose-Capillary: 133 mg/dL — ABNORMAL HIGH (ref 70–99)
Glucose-Capillary: 143 mg/dL — ABNORMAL HIGH (ref 70–99)

## 2022-05-22 LAB — POCT ACTIVATED CLOTTING TIME
Activated Clotting Time: 426 seconds
Activated Clotting Time: 281 seconds
Activated Clotting Time: 317 seconds
Activated Clotting Time: 504 seconds
Activated Clotting Time: 287 seconds

## 2022-05-22 SURGERY — CORONARY STENT INTERVENTION
Anesthesia: LOCAL

## 2022-05-22 MED ORDER — SODIUM CHLORIDE 0.9 % IV SOLN: INTRAVENOUS | Status: DC

## 2022-05-22 MED ORDER — MIDAZOLAM HCL 2 MG/2ML IJ SOLN
INTRAMUSCULAR | Status: AC
  Filled 2022-05-22: qty 2

## 2022-05-22 MED ORDER — MIDAZOLAM HCL 2 MG/2ML IJ SOLN
INTRAMUSCULAR | Status: DC | PRN
  Administered 2022-05-22 (×2): 1 mg via INTRAVENOUS
  Administered 2022-05-22: 2 mg via INTRAVENOUS

## 2022-05-22 MED ORDER — NITROGLYCERIN 1 MG/10 ML FOR IR/CATH LAB
INTRA_ARTERIAL | Status: AC
  Filled 2022-05-22: qty 10

## 2022-05-22 MED ORDER — ENSURE ENLIVE PO LIQD: 237.0000 mL | Freq: Two times a day (BID) | ORAL | Status: DC

## 2022-05-22 MED ORDER — VERAPAMIL HCL 2.5 MG/ML IV SOLN
INTRAVENOUS | Status: AC
  Filled 2022-05-22: qty 2

## 2022-05-22 MED ORDER — HEPARIN SODIUM (PORCINE) 1000 UNIT/ML IJ SOLN
INTRAMUSCULAR | Status: DC | PRN
  Administered 2022-05-22: 2000 [IU] via INTRAVENOUS
  Administered 2022-05-22: 6000 [IU] via INTRAVENOUS

## 2022-05-22 MED ORDER — SODIUM CHLORIDE 0.9% FLUSH: 3.0000 mL | INTRAVENOUS | Status: DC | PRN

## 2022-05-22 MED ORDER — VERAPAMIL HCL 2.5 MG/ML IV SOLN
INTRAVENOUS | Status: DC | PRN
  Administered 2022-05-22: 10 mL via INTRA_ARTERIAL

## 2022-05-22 MED ORDER — HEPARIN (PORCINE) IN NACL 1000-0.9 UT/500ML-% IV SOLN
INTRAVENOUS | Status: AC
  Filled 2022-05-22: qty 500

## 2022-05-22 MED ORDER — NITROGLYCERIN IN D5W 200-5 MCG/ML-% IV SOLN
0.0000 ug/min | INTRAVENOUS | Status: DC
  Administered 2022-05-22: 5 ug/min via INTRAVENOUS

## 2022-05-22 MED ORDER — HEPARIN (PORCINE) IN NACL 1000-0.9 UT/500ML-% IV SOLN
INTRAVENOUS | Status: DC | PRN
  Administered 2022-05-22 (×2): 500 mL

## 2022-05-22 MED ORDER — LIDOCAINE HCL (PF) 1 % IJ SOLN
INTRAMUSCULAR | Status: AC
  Filled 2022-05-22: qty 30

## 2022-05-22 MED ORDER — LABETALOL HCL 5 MG/ML IV SOLN: 10.0000 mg | INTRAVENOUS | Status: AC | PRN

## 2022-05-22 MED ORDER — SODIUM CHLORIDE 0.9 % IV SOLN: INTRAVENOUS | Status: AC

## 2022-05-22 MED ORDER — ONDANSETRON HCL 4 MG/2ML IJ SOLN: 4.0000 mg | Freq: Four times a day (QID) | INTRAMUSCULAR | Status: DC | PRN

## 2022-05-22 MED ORDER — NITROGLYCERIN 1 MG/10 ML FOR IR/CATH LAB
INTRA_ARTERIAL | Status: DC | PRN
  Administered 2022-05-22: 500 ug via INTRA_ARTERIAL
  Administered 2022-05-22: 200 ug via INTRACORONARY

## 2022-05-22 MED ORDER — HYDRALAZINE HCL 20 MG/ML IJ SOLN
10.0000 mg | INTRAMUSCULAR | Status: AC | PRN
Start: 2022-05-22 — End: 2022-05-22

## 2022-05-22 MED ORDER — ACETAMINOPHEN 325 MG PO TABS: 650.0000 mg | ORAL_TABLET | ORAL | Status: DC | PRN

## 2022-05-22 MED ORDER — SODIUM CHLORIDE 0.9% FLUSH
3.0000 mL | Freq: Two times a day (BID) | INTRAVENOUS | Status: DC
  Administered 2022-05-22 (×2): 3 mL via INTRAVENOUS

## 2022-05-22 MED ORDER — LIDOCAINE HCL (PF) 1 % IJ SOLN
INTRAMUSCULAR | Status: DC | PRN
  Administered 2022-05-22: 2 mL via SUBCUTANEOUS

## 2022-05-22 MED ORDER — FENTANYL CITRATE (PF) 100 MCG/2ML IJ SOLN
INTRAMUSCULAR | Status: AC
  Filled 2022-05-22: qty 2

## 2022-05-22 MED ORDER — IOHEXOL 350 MG/ML SOLN
INTRAVENOUS | Status: DC | PRN
  Administered 2022-05-22: 135 mL

## 2022-05-22 MED ORDER — HEPARIN SODIUM (PORCINE) 1000 UNIT/ML IJ SOLN
INTRAMUSCULAR | Status: AC
  Filled 2022-05-22: qty 10

## 2022-05-22 MED ORDER — SODIUM CHLORIDE 0.9 % IV SOLN
250.0000 mL | INTRAVENOUS | Status: DC | PRN
Start: 2022-05-22 — End: 2022-05-23

## 2022-05-22 MED ORDER — FENTANYL CITRATE (PF) 100 MCG/2ML IJ SOLN
INTRAMUSCULAR | Status: DC | PRN
  Administered 2022-05-22: 50 ug via INTRAVENOUS
  Administered 2022-05-22 (×2): 25 ug via INTRAVENOUS

## 2022-05-22 MED ORDER — CLOPIDOGREL BISULFATE 75 MG PO TABS: 75.0000 mg | ORAL_TABLET | Freq: Every day | ORAL | Status: DC

## 2022-05-22 MED FILL — Nitroglycerin IV Soln 100 MCG/ML in D5W: INTRA_ARTERIAL | Qty: 10 | Status: AC

## 2022-05-22 SURGICAL SUPPLY — 24 items
BALLN MINITREK OTW 1.5X6 (BALLOONS) ×2
BALLN MINITREK OTW 2.0X12 (BALLOONS) ×2
BALLOON MINITREK OTW 1.5X6 (BALLOONS) IMPLANT
BALLOON MINITREK OTW 2.0X12 (BALLOONS) IMPLANT
BAND ZEPHYR COMPRESS 30 LONG (HEMOSTASIS) ×1 IMPLANT
CATH INFINITI 5 FR IM (CATHETERS) ×1 IMPLANT
CATH LAUNCHER 6FR IMA (CATHETERS) ×1 IMPLANT
CATH SUPERCROSS ANGLED 90 DEG (MICROCATHETER) ×1 IMPLANT
ELECT DEFIB PAD ADLT CADENCE (PAD) ×1 IMPLANT
GLIDESHEATH SLEND SS 6F .021 (SHEATH) ×1 IMPLANT
GUIDEWIRE INQWIRE 1.5J.035X260 (WIRE) IMPLANT
INQWIRE 1.5J .035X260CM (WIRE) ×2
KIT ENCORE 26 ADVANTAGE (KITS) ×1 IMPLANT
KIT HEART LEFT (KITS) ×2 IMPLANT
KIT SINGLE USE MANIFOLD (KITS) ×1 IMPLANT
PACK CARDIAC CATHETERIZATION (CUSTOM PROCEDURE TRAY) ×2 IMPLANT
SHEATH PROBE COVER 6X72 (BAG) ×1 IMPLANT
STENT SYNERGY XD 2.25X24 (Permanent Stent) IMPLANT
SYNERGY XD 2.25X24 (Permanent Stent) ×2 IMPLANT
TRANSDUCER W/STOPCOCK (MISCELLANEOUS) ×2 IMPLANT
TUBING CIL FLEX 10 FLL-RA (TUBING) ×2 IMPLANT
VALVE GUARDIAN II ~~LOC~~ HEMO (MISCELLANEOUS) ×1 IMPLANT
WIRE ASAHI PROWATER 300CM (WIRE) ×2 IMPLANT
WIRE HI TORQ WHISPER MS 300CM (WIRE) ×1 IMPLANT

## 2022-05-22 NOTE — Progress Notes (Addendum)
Progress Note  Patient Name: [name] [name] Date of Encounter: 05/22/2022  [facility] Cardiologist: Kristeen Miss, MD   Subjective   Patient had cath yesterday-- scheduled to undergo PCI today. After cath yesteray, patient did become hypotensive with BP down to 82/42. IV nitro was stopped, patient was given an 500 cc bolus of NS. BP improved and patient became hypertensive with BP up to 173/69 this AM. Developed chest pain, was started again on IV nitro with improvement. Currently, patient is chest pain free. Does not have any more n/v, shoulder pain. Denies sob. Denies pain, swelling, or bleeding from right femoral cath site    Inpatient Medications    Scheduled Meds:  aspirin  81 mg Oral Daily   clopidogrel  75 mg Oral Q breakfast   enoxaparin (LOVENOX) injection  40 mg Subcutaneous Q24H   insulin aspart  0-15 Units Subcutaneous TID WC   lidocaine  1 patch Transdermal Q24H   losartan  25 mg Oral Daily   metoprolol tartrate  12.5 mg Oral BID   sodium chloride flush  3 mL Intravenous Q12H   Continuous Infusions:  sodium chloride 57 mL/hr at 05/21/22 0514   sodium chloride     sodium chloride 75 mL/hr at 05/22/22 0734   nitroGLYCERIN     PRN Meds: sodium chloride, acetaminophen, morphine injection, ondansetron (ZOFRAN) IV   Vital Signs    Vitals:   05/22/22 0620 05/22/22 0625 05/22/22 0630 05/22/22 0634  BP: 132/79 (!) 173/84 (!) 153/69 (!) 148/59  Pulse: 74 60 100 (!) 51  Resp:      Temp:      TempSrc:      SpO2: 100% 100% 96% 100%  Weight:      Height:        Intake/Output Summary (Last 24 hours) at 05/22/2022 0818 Last data filed at 05/21/2022 2300 Gross per 24 hour  Intake 1967.23 ml  Output 500 ml  Net 1467.23 ml      05/22/2022    3:36 AM 05/21/2022    5:00 AM 05/20/2022    7:09 AM  Last 3 Weights  Weight (lbs) 128 lb 11.2 oz 131 lb 9.8 oz 125 lb  Weight (kg) 58.378 kg 59.7 kg 56.7 kg      Telemetry    Sinus rhythm with PVCs, HR in the 50s-70s -  Personally Reviewed  ECG    Sinus rhythm, T wave inversions in leads II, III, aVF, V2-V6. Prolonged QT (QT/QTcB 500/515 ms)  - Personally Reviewed  Physical Exam   GEN: No acute distress. Sitting upright on the side of the bed  Neck: No JVD Cardiac: RRR, no murmurs, rubs, or gallops.  Respiratory: Clear to auscultation bilaterally. GI: Soft, nontender, non-distended  MS: No edema; No deformity. Neuro:  Nonfocal  Psych: Normal affect   Labs    High Sensitivity Troponin:   Recent Labs  Lab 05/06/22 1958 05/06/22 2159 05/07/22 0254  TROPONINIHS 7 8 12      Chemistry Recent Labs  Lab 05/20/22 1529 05/21/22 0038 05/22/22 0244  NA 140 137 137  K 4.4 4.4 4.1  CL 105 105 108  CO2 24 24 21*  GLUCOSE 117* 204* 203*  BUN 14 13 8   CREATININE 0.93 1.21* 0.90  CALCIUM 9.2 8.9 8.6*  PROT 6.6  --   --   ALBUMIN 3.4*  --   --   AST 21  --   --   ALT 14  --   --  ALKPHOS 51  --   --   BILITOT 0.9  --   --   GFRNONAA >60 45* >60  ANIONGAP 11 8 8     Lipids No results for input(s): "CHOL", "TRIG", "HDL", "LABVLDL", "LDLCALC", "CHOLHDL" in the last 168 hours.  Hematology Recent Labs  Lab 05/20/22 1529 05/21/22 0038 05/22/22 0244  WBC 6.1 6.2 5.5  RBC 4.13 3.78* 3.89  HGB 12.0 11.0* 11.6*  HCT 37.6 34.8* 35.6*  MCV 91.0 92.1 91.5  MCH 29.1 29.1 29.8  MCHC 31.9 31.6 32.6  RDW 16.2* 16.5* 16.6*  PLT 226 200 209   Thyroid No results for input(s): "TSH", "FREET4" in the last 168 hours.  BNPNo results for input(s): "BNP", "PROBNP" in the last 168 hours.  DDimer No results for input(s): "DDIMER" in the last 168 hours.   Radiology    CARDIAC CATHETERIZATION  Result Date: 05/21/2022 Aborted Cath Not safe to give sedation.  Case cancelled & postponed to 7/6.  9/6, MD  CARDIAC CATHETERIZATION  Result Date: 05/21/2022   Dist LM to Ost LAD lesion is 95% stenosed.   Mid LM to Dist LM lesion is 60% stenosed.   Prox LAD lesion is 30% stenosed.   Dist LAD lesion is 80%  stenosed.   Ost Cx to Prox Cx lesion is 40% stenosed.   Prox RCA lesion is 85% stenosed.   Ost 1st Diag-2 lesion is 75% stenosed.   Ost 1st Diag-1 lesion is 40% stenosed.   Acute Mrg lesion is 90% stenosed.   Mid LAD lesion is 99% stenosed.   Origin to Prox Graft lesion is 100% stenosed.   LIMA graft was visualized by angiography and is normal in caliber.   SVG graft was visualized by angiography and is normal in caliber.   SVG graft was visualized by angiography.   The graft exhibits no disease. Severe distal left main stenosis Severe ostial LAD stenosis. The LIMA graft to the mid LAD is patent but there is a severe stenosis in the mid LAD just beyond the insertion of the IMA graft. Severe apical LAD stenosis. Moderate ostial Circumflex stenosis. Occluded SVG to OM2 Patent SVG to left PDA. The native RCA is a non-dominant vessel with severe proximal vessel stenosis and severe stenosis in the ostium of an RV marginal branch. Failed attempted PCI of the mid LAD through the LIMA graft. Unable to engage a guiding catheter (5 or 6 07/22/2022) in the LIMA from both the left radial approach and the right femoral approach. Recommendations: She has been loaded with Plavix. I think the best approach to attempt PCI of her mid LAD is antegrade through the native system. I was unable to engage the LIMA graft from both the left radial approach and the right groin approach despite multiple attempts. PCI aborted due to radiation dose and contrast load.  Will plan PCI of the LAD tomorrow if renal function is stable.   DG Chest 2 View  Result Date: 05/20/2022 CLINICAL DATA:  Chest pain in a 81 year old female. EXAM: CHEST - 2 VIEW COMPARISON:  May 06, 2022. FINDINGS: Post median sternotomy for CABG. Cardiomediastinal contours and hilar structures are stable. Linear atelectasis at the LEFT lung base. No signs of effusion or consolidation. Lungs are hyperinflated. On limited assessment no acute skeletal findings with marked LEFT  glenohumeral degenerative changes. IMPRESSION: 1. Post CABG changes. 2. Chronic hyperinflation, correlate with any signs of COPD. 3. No acute cardiopulmonary disease. Electronically Signed   By: May 08, 2022  M.D.   On: 05/20/2022 17:06    Cardiac Studies   Left Heart Cath 05/21/22   Dist LM to Ost LAD lesion is 95% stenosed.   Mid LM to Dist LM lesion is 60% stenosed.   Prox LAD lesion is 30% stenosed.   Dist LAD lesion is 80% stenosed.   Ost Cx to Prox Cx lesion is 40% stenosed.   Prox RCA lesion is 85% stenosed.   Ost 1st Diag-2 lesion is 75% stenosed.   Ost 1st Diag-1 lesion is 40% stenosed.   Acute Mrg lesion is 90% stenosed.   Mid LAD lesion is 99% stenosed.   Origin to Prox Graft lesion is 100% stenosed.   LIMA graft was visualized by angiography and is normal in caliber.   SVG graft was visualized by angiography and is normal in caliber.   SVG graft was visualized by angiography.   The graft exhibits no disease.   Severe distal left main stenosis Severe ostial LAD stenosis. The LIMA graft to the mid LAD is patent but there is a severe stenosis in the mid LAD just beyond the insertion of the IMA graft. Severe apical LAD stenosis.  Moderate ostial Circumflex stenosis.  Occluded SVG to OM2 Patent SVG to left PDA.  The native RCA is a non-dominant vessel with severe proximal vessel stenosis and severe stenosis in the ostium of an RV marginal branch.  Failed attempted PCI of the mid LAD through the LIMA graft. Unable to engage a guiding catheter (5 or 6 [place]) in the LIMA from both the left radial approach and the right femoral approach.    Recommendations: She has been loaded with Plavix. I think the best approach to attempt PCI of her mid LAD is antegrade through the native system. I was unable to engage the LIMA graft from both the left radial approach and the right groin approach despite multiple attempts. PCI aborted due to radiation dose and contrast load.  Will plan PCI of the  LAD tomorrow if renal function is stable.   Diagnostic Dominance: Co-dominant   Echocardiogram 05/07/22   1. Only parasternal views obtained; no apical or subcostal views as pt  refused.   2. Left ventricular ejection fraction, by estimation, is 60 to 65%. The  left ventricle has normal function. The left ventricle has no regional  wall motion abnormalities. Left ventricular diastolic function could not  be evaluated.   3. Right ventricular systolic function was not well visualized. The right  ventricular size is not well visualized.   4. The mitral valve is normal in structure. Trivial mitral valve  regurgitation. No evidence of mitral stenosis.   5. The aortic valve is tricuspid. Aortic valve regurgitation is not  visualized. No aortic stenosis is present.   6. Mild pulmonic stenosis.   Patient Profile     81 y.o. female with a past medical history of CAD, carotid artery disease, HLD, statin intolerance, rheumatoid arthritis. Presented for outpatient cath on 7/5 that was postponed due to n/v, severe shoulder pain. Underwent cath yesterday, scheduled for staged PCI today.  Assessment & Plan    CAD s/p CABG 11/2021  - Patient recently underwent CABG x3 with LIMA-LAD, SVG-OM, SVG-PL in 11/2021  - Has been followed by Dr. Elease Hashimoto since then-- last seen on 6/27. At that appointment, patient reported that she was experiencing recurrent episodes of chest pain that were exactly like what she felt prior to her surgery  - Planned to undergo outpatient cardiac catheterization 7/5,  but patient had severe left shoulder pain, N/V, hypertension. Case was postponed and patient was started on nitro drip due to high BP, chest pain - Underwent LHC yesterday with above findings. Attempted PCI of the mid LAD through LIMA graft but was unsuccessful. Aborted PCI due to radiation dose and contrast load-- planned to undergo PCI of her mid LAD today through native system. Patient has been NPO and is ready for  procedure this AM - Renal function stable for now. Hold losartan due to high contrast load yesterday and additional contrast today  - Of note, patient did become hypotensive after cath yesterday and IV nitro was stopped. BP improved and patient became hypertensive this AM. Developed chest pain this AM that required IV nitro  - Continue metoprolol 12.5 mg BID  - Patient has been on Imdur 60 mg daily. Held for now as patient is on IV nitro. If she continues to have pain after PCI, could try to increased imdur  - Continue ASA, plavix    HTN  - On IV nitro for chest pain, helping control BP   - Hold home losartan prior to cath to protect renal function. Plan to resume and titrate as needed  - Continue home metoprolol 12.5 mg BID  - Continue imdur 60 mg daily when off IV nito    HLD  Statin Intolerance  - LDL 89, Triglycerides 37, HDL 73 in 11/2021  - May benefit from referral to lipid clinic at discharge   For questions or updates, please contact [facility] Please consult [email or web address] for contact info under        Signed, Jonita Albee, PA-C  05/22/2022, 8:18 AM     Personally seen and examined. Agree with APP above with the following comments:  Briefly 81 yo W with recent CABG with angina and SVG- OM occlusion with mid LAD disease.  PCI today.  Off nitro drip; feels well.  Had L radial hematoma without bruit. Reviewed anatomy with patient and daughter. Continue DAPT and BB.   Pushed out hematoma on exam distal to TR band.  Sats on that hand are 100%. Will need slow air removal. Discharge for 05/23/22, has labs ordered.  Riley Lam, MD FASE Cardiologist [facility]  [address] [place], [address] [place], [place] [address] [phone]  1:43 PM

## 2022-05-22 NOTE — Interval H&P Note (Signed)
Cath Lab Visit (complete for each Cath Lab visit)  Clinical Evaluation Leading to the Procedure:   ACS: Yes.    Non-ACS:    Anginal Classification: CCS IV  Anti-ischemic medical therapy: Minimal Therapy (1 class of medications)  Non-Invasive Test Results: No non-invasive testing performed  Prior CABG: Previous CABG      History and Physical Interval Note:  05/22/2022 8:46 AM  Angelena Form  has presented today for surgery, with the diagnosis of cad.  The various methods of treatment have been discussed with the patient and family. After consideration of risks, benefits and other options for treatment, the patient has consented to  Procedure(s): CORONARY STENT INTERVENTION (N/A) as a surgical intervention.  The patient's history has been reviewed, patient examined, no change in status, stable for surgery.  I have reviewed the patient's chart and labs.  Questions were answered to the patient's satisfaction.     Lance Muss

## 2022-05-23 DIAGNOSIS — I200 Unstable angina: Secondary | ICD-10-CM

## 2022-05-23 LAB — BASIC METABOLIC PANEL
Anion gap: 3 — ABNORMAL LOW (ref 5–15)
BUN: 8 mg/dL (ref 8–23)
CO2: 23 mmol/L (ref 22–32)
Calcium: 8.4 mg/dL — ABNORMAL LOW (ref 8.9–10.3)
Chloride: 111 mmol/L (ref 98–111)
Creatinine, Ser: 1.1 mg/dL — ABNORMAL HIGH (ref 0.44–1.00)
GFR, Estimated: 50 mL/min — ABNORMAL LOW (ref >60)
Glucose, Bld: 124 mg/dL — ABNORMAL HIGH (ref 70–99)
Potassium: 4 mmol/L (ref 3.5–5.1)
Sodium: 137 mmol/L (ref 135–145)

## 2022-05-23 LAB — CBC
HCT: 30.5 % — ABNORMAL LOW (ref 36.0–46.0)
Hemoglobin: 9.9 g/dL — ABNORMAL LOW (ref 12.0–15.0)
MCH: 29.3 pg (ref 26.0–34.0)
MCHC: 32.5 g/dL (ref 30.0–36.0)
MCV: 90.2 fL (ref 80.0–100.0)
Platelets: 177 10*3/uL (ref 150–400)
RBC: 3.38 MIL/uL — ABNORMAL LOW (ref 3.87–5.11)
RDW: 16.5 % — ABNORMAL HIGH (ref 11.5–15.5)
WBC: 6 10*3/uL (ref 4.0–10.5)
nRBC: 0 % (ref 0.0–0.2)

## 2022-05-23 LAB — GLUCOSE, CAPILLARY: Glucose-Capillary: 84 mg/dL (ref 70–99)

## 2022-05-23 LAB — LIPOPROTEIN A (LPA): Lipoprotein (a): 213 nmol/L — ABNORMAL HIGH (ref <75.0)

## 2022-05-23 MED ORDER — CLOPIDOGREL BISULFATE 75 MG PO TABS: 75.0000 mg | ORAL_TABLET | Freq: Every day | ORAL | 3 refills | Status: DC

## 2022-05-23 MED ORDER — NITROGLYCERIN 0.4 MG SL SUBL: 0.4000 mg | SUBLINGUAL_TABLET | SUBLINGUAL | 12 refills | Status: AC | PRN

## 2022-05-23 MED ORDER — NITROGLYCERIN 0.4 MG SL SUBL: 0.4000 mg | SUBLINGUAL_TABLET | SUBLINGUAL | 12 refills | Status: DC | PRN

## 2022-05-23 NOTE — Discharge Summary (Addendum)
Discharge Summary    Patient ID: [name] [name] MRN: [id number]; DOB: [birth year]  Admit date: 05/20/2022 Discharge date: 05/23/2022  PCP:  Irena Reichmann, DO   [facility] Providers Cardiologist:  Kristeen Miss, MD  Cardiology APP:  Beatrice Lecher, PA-C  {  Discharge Diagnoses    Principal Problem:   CAD (coronary artery disease) Active Problems:   Progressive angina (HCC)   HTN HLD DM RA   Diagnostic Studies/Procedures     CORONARY STENT INTERVENTION  05/22/2022   Conclusion      Successful PCI of the mid LAD through the LIMA graft with a 2.25 x 24 Synergy drug-eluting stent.   Continue dual antiplatelet therapy along with aggressive secondary prevention.  Would watch overnight and plan for discharge tomorrow.     Difficult to engage LIMA with a guide catheter.  Super cross catheter was used to help direct a wire.  Diagnostic Dominance: Co-dominant  Intervention   Left Heart Cath 05/21/22   Dist LM to Ost LAD lesion is 95% stenosed.   Mid LM to Dist LM lesion is 60% stenosed.   Prox LAD lesion is 30% stenosed.   Dist LAD lesion is 80% stenosed.   Ost Cx to Prox Cx lesion is 40% stenosed.   Prox RCA lesion is 85% stenosed.   Ost 1st Diag-2 lesion is 75% stenosed.   Ost 1st Diag-1 lesion is 40% stenosed.   Acute Mrg lesion is 90% stenosed.   Mid LAD lesion is 99% stenosed.   Origin to Prox Graft lesion is 100% stenosed.   LIMA graft was visualized by angiography and is normal in caliber.   SVG graft was visualized by angiography and is normal in caliber.   SVG graft was visualized by angiography.   The graft exhibits no disease.   Severe distal left main stenosis Severe ostial LAD stenosis. The LIMA graft to the mid LAD is patent but there is a severe stenosis in the mid LAD just beyond the insertion of the IMA graft. Severe apical LAD stenosis.  Moderate ostial Circumflex stenosis.  Occluded SVG to OM2 Patent SVG to left PDA.  The native RCA is a  non-dominant vessel with severe proximal vessel stenosis and severe stenosis in the ostium of an RV marginal branch.  Failed attempted PCI of the mid LAD through the LIMA graft. Unable to engage a guiding catheter (5 or 6 [place]) in the LIMA from both the left radial approach and the right femoral approach.    Recommendations: She has been loaded with Plavix. I think the best approach to attempt PCI of her mid LAD is antegrade through the native system. I was unable to engage the LIMA graft from both the left radial approach and the right groin approach despite multiple attempts. PCI aborted due to radiation dose and contrast load.  [name] [name] plan PCI of the LAD tomorrow if renal function is stable.    Diagnostic Dominance: Co-dominant      History of Present Illness     [name] [name] is a 81 y.o. female with hx of CAD s/p prior intervention and CABG 11/2021, DM, RA, HTN, HLD with statin, zetia & Evolocumab intolerance presented for outpatient cath.   Coronary artery disease  S/p MI 06/1996 tx with POBA to LCx Cath 12/2018: dLAD 80, pRCA 85 (small) >> med Rx; PCI of dLAD only if refractory angina Intol of Toprol (arthralgias) >> changed to Imdur S/p CABG in 1/23 (LIMA-LAD, SVG-OM, SVG-PL)  Echo May 07, 2022: normal LV systolic function ,  EF 60-65%  Patient was having recurrent chest pain similar to prior angina when seen by Dr. Elease Hashimoto 05/12/22. Cath arranged.    Hospital Course     Consultants: None  CAD - Presented for planned outpatient cardiac catheterization 7/5, but patient had severe left shoulder pain, N/V, hypertension. Case was postponed and patient was started on nitro drip due to high BP, chest pain. The patient was admitted.  - Underwent LHC 7/6 with above findings. Attempted PCI of the mid LAD through LIMA graft but was unsuccessful. Aborted PCI due to radiation dose and contrast load. Followed cath, patient did become hypotensive with BP down to 82/42. IV nitro was stopped,  patient was given an 500 cc bolus of NS. BP improved and patient became hypertensive with BP up to 173/69 .  - The patient underwent successful PCI of the mid LAD through the LIMA graft with a 2.25 x 24 Synergy drug-eluting stent 05/22/2022. - No recurrent pain post PCI.  - Renal function stable  - Continue ASA, plavix, BB and Imdur   HTN  - treated with IV nitro for chest pain and elevated BP. Had transient hypotension post cath on 7/6 - Continue home metoprolol 12.5 mg BID  - Continue imdur 60 mg daily   - Resume home ARB  HLD  Statin Intolerance  - LDL 89, Triglycerides 37, HDL 73 in 11/2021  - May benefit from referral to lipid clinic at discharge   BMP at follow up.   The patient been seen by Dr. Elberta Fortis today and deemed ready for discharge home. All follow-up appointments have been scheduled. Discharge medications are listed below.    Did the patient have an acute coronary syndrome (MI, NSTEMI, STEMI, etc) this admission?:  No                               Did the patient have a percutaneous coronary intervention (stent / angioplasty)?:  Yes.     Cath/PCI Registry Performance & Quality Measures: Aspirin prescribed? - Yes ADP Receptor Inhibitor (Plavix/Clopidogrel, Brilinta/Ticagrelor or Effient/Prasugrel) prescribed (includes medically managed patients)? - Yes High Intensity Statin (Lipitor 40-80mg  or Crestor 20-40mg ) prescribed? - No - Statin intolerance For EF <40%, was ACEI/ARB prescribed? - Yes For EF <40%, Aldosterone Antagonist (Spironolactone or Eplerenone) prescribed? - Not Applicable (EF >/= 40%) Cardiac Rehab Phase II ordered? - Yes     The patient [name] [name] be scheduled for a TOC follow up appointment in 6 days.  A message has been sent to the [facility] and Scheduling Pool at the office where the patient should be seen for follow up.    Discharge Vitals Blood pressure (!) 112/55, pulse 60, temperature 98.8 F (37.1 C), temperature source Oral, resp. rate 18, height 5'  1.5" (1.562 m), weight 58.4 kg, SpO2 98 %.  Filed Weights   05/20/22 0709 05/21/22 0500 05/22/22 0336  Weight: 56.7 kg 59.7 kg 58.4 kg    Labs & Radiologic Studies    CBC Recent Labs    05/22/22 0244 05/23/22 0225  WBC 5.5 6.0  HGB 11.6* 9.9*  HCT 35.6* 30.5*  MCV 91.5 90.2  PLT 209 177   Basic Metabolic Panel Recent Labs    16/10/96 0244 05/23/22 0225  NA 137 137  K 4.1 4.0  CL 108 111  CO2 21* 23  GLUCOSE 203* 124*  BUN 8 8  CREATININE 0.90  1.10*  CALCIUM 8.6* 8.4*   Liver Function Tests Recent Labs    05/20/22 1529  AST 21  ALT 14  ALKPHOS 51  BILITOT 0.9  PROT 6.6  ALBUMIN 3.4*    High Sensitivity Troponin:   Recent Labs  Lab 05/06/22 1958 05/06/22 2159 05/07/22 0254  TROPONINIHS 7 8 12     _____________  CARDIAC CATHETERIZATION  Addendum Date: 05/22/2022     Successful PCI of the mid LAD through the LIMA graft with a 2.25 x 24 Synergy drug-eluting stent. Continue dual antiplatelet therapy along with aggressive secondary prevention.  Would watch overnight and plan for discharge tomorrow.  Difficult to engage LIMA with a guide catheter.  Super cross catheter was used to help direct a wire.  Result Date: 05/22/2022   Successful PCI of the mid LAD through the LIMA graft with a 2.25 x 24 Synergy drug-eluting stent. Continue dual antiplatelet therapy along with aggressive secondary prevention.  Would watch overnight and plan for discharge tomorrow.   CARDIAC CATHETERIZATION  Result Date: 05/21/2022 Aborted Cath Not safe to give sedation.  Case cancelled & postponed to 7/6.  9/6, MD  CARDIAC CATHETERIZATION  Result Date: 05/21/2022   Dist LM to Ost LAD lesion is 95% stenosed.   Mid LM to Dist LM lesion is 60% stenosed.   Prox LAD lesion is 30% stenosed.   Dist LAD lesion is 80% stenosed.   Ost Cx to Prox Cx lesion is 40% stenosed.   Prox RCA lesion is 85% stenosed.   Ost 1st Diag-2 lesion is 75% stenosed.   Ost 1st Diag-1 lesion is 40% stenosed.    Acute Mrg lesion is 90% stenosed.   Mid LAD lesion is 99% stenosed.   Origin to Prox Graft lesion is 100% stenosed.   LIMA graft was visualized by angiography and is normal in caliber.   SVG graft was visualized by angiography and is normal in caliber.   SVG graft was visualized by angiography.   The graft exhibits no disease. Severe distal left main stenosis Severe ostial LAD stenosis. The LIMA graft to the mid LAD is patent but there is a severe stenosis in the mid LAD just beyond the insertion of the IMA graft. Severe apical LAD stenosis. Moderate ostial Circumflex stenosis. Occluded SVG to OM2 Patent SVG to left PDA. The native RCA is a non-dominant vessel with severe proximal vessel stenosis and severe stenosis in the ostium of an RV marginal branch. Failed attempted PCI of the mid LAD through the LIMA graft. Unable to engage a guiding catheter (5 or 6 07/22/2022) in the LIMA from both the left radial approach and the right femoral approach. Recommendations: She has been loaded with Plavix. I think the best approach to attempt PCI of her mid LAD is antegrade through the native system. I was unable to engage the LIMA graft from both the left radial approach and the right groin approach despite multiple attempts. PCI aborted due to radiation dose and contrast load.  [name] [name] plan PCI of the LAD tomorrow if renal function is stable.   DG Chest 2 View  Result Date: 05/20/2022 CLINICAL DATA:  Chest pain in a 81 year old female. EXAM: CHEST - 2 VIEW COMPARISON:  May 06, 2022. FINDINGS: Post median sternotomy for CABG. Cardiomediastinal contours and hilar structures are stable. Linear atelectasis at the LEFT lung base. No signs of effusion or consolidation. Lungs are hyperinflated. On limited assessment no acute skeletal findings with marked LEFT glenohumeral degenerative changes. IMPRESSION: 1.  Post CABG changes. 2. Chronic hyperinflation, correlate with any signs of COPD. 3. No acute cardiopulmonary disease.  Electronically Signed   By: Donzetta Kohut M.D.   On: 05/20/2022 17:06   ECHOCARDIOGRAM LIMITED  Result Date: 05/07/2022    ECHOCARDIOGRAM REPORT   Patient Name:   LADONYA JERKINS Date of Exam: 05/07/2022 Medical Rec #:  [id number]       Height:       61.5 in Accession #:    [id number]      Weight:       131.5 lb Date of Birth:  Mar 13, 1941       BSA:          1.590 m Patient Age:    81 years        BP:           164/62 mmHg Patient Gender: F               HR:           77 bpm. Exam Location:  Inpatient Procedure: 2D Echo, Limited Echo and Limited Color Doppler Indications:    Chest pain  History:        Patient has prior history of Echocardiogram examinations. CAD;                 Risk Factors:Hypertension and Diabetes.  Sonographer:    Cleatis Polka Referring Phys: [id number] TIMOTHY S OPYD  Sonographer Comments: Limited echo due to pt refusing further images. IMPRESSIONS  1. Only parasternal views obtained; no apical or subcostal views as pt refused.  2. Left ventricular ejection fraction, by estimation, is 60 to 65%. The left ventricle has normal function. The left ventricle has no regional wall motion abnormalities. Left ventricular diastolic function could not be evaluated.  3. Right ventricular systolic function was not well visualized. The right ventricular size is not well visualized.  4. The mitral valve is normal in structure. Trivial mitral valve regurgitation. No evidence of mitral stenosis.  5. The aortic valve is tricuspid. Aortic valve regurgitation is not visualized. No aortic stenosis is present.  6. Mild pulmonic stenosis. FINDINGS  Left Ventricle: Left ventricular ejection fraction, by estimation, is 60 to 65%. The left ventricle has normal function. The left ventricle has no regional wall motion abnormalities. The left ventricular internal cavity size was normal in size. There is  no left ventricular hypertrophy. Left ventricular diastolic function could not be evaluated. Right Ventricle: The right  ventricular size is not well visualized. Right ventricular systolic function was not well visualized. Left Atrium: Left atrial size was normal in size. Right Atrium: Right atrial size was normal in size. Pericardium: There is no evidence of pericardial effusion. Mitral Valve: The mitral valve is normal in structure. Trivial mitral valve regurgitation. No evidence of mitral valve stenosis. Tricuspid Valve: The tricuspid valve is normal in structure. Tricuspid valve regurgitation is mild . No evidence of tricuspid stenosis. Aortic Valve: The aortic valve is tricuspid. Aortic valve regurgitation is not visualized. No aortic stenosis is present. Pulmonic Valve: The pulmonic valve was grossly normal. Pulmonic valve regurgitation is not visualized. Mild pulmonic stenosis. Aorta: The aortic root is normal in size and structure. Ascending aorta measurements are within normal limits for age when indexed to body surface area. IAS/Shunts: The interatrial septum was not assessed. Additional Comments: Only parasternal views obtained; no apical or subcostal views as pt refused.  LEFT VENTRICLE PLAX 2D LVIDd:  4.00 cm LVIDs:         2.80 cm LV PW:         0.90 cm LV IVS:        0.70 cm LVOT diam:     1.80 cm LVOT Area:     2.54 cm  LEFT ATRIUM         Index LA diam:    3.70 cm 2.33 cm/m   AORTA Ao Root diam: 2.40 cm Ao Asc diam:  2.60 cm TRICUSPID VALVE TR Peak grad:   29.4 mmHg TR Vmax:        271.00 cm/s  SHUNTS Systemic Diam: 1.80 cm Olga Millers MD Electronically signed by Olga Millers MD Signature Date/Time: 05/07/2022/10:57:37 AM    Final    DG Chest 2 View  Result Date: 05/06/2022 CLINICAL DATA:  Chest pain. EXAM: CHEST - 2 VIEW COMPARISON:  Chest radiograph dated 01/30/2022. FINDINGS: Background of emphysema. No focal consolidation, pleural effusion, or pneumothorax. The cardiac silhouette is within normal limits. Atherosclerotic calcification of the aorta. Median sternotomy wires. Osteopenia with  degenerative changes of the spine and shoulders. No acute osseous pathology. IMPRESSION: No active cardiopulmonary disease. Electronically Signed   By: Elgie Collard M.D.   On: 05/06/2022 20:16    Disposition   Pt is being discharged home today in good condition.  Follow-up Plans & Appointments     Follow-up Information     Nahser, Deloris Ping, MD Follow up.   Specialty: Cardiology Why: office Finas Delone call with date and time of appointment Contact information: [address] [place] [address] [phone]                Discharge Instructions     AMB Referral to Cardiac Rehabilitation - Phase II   Complete by: As directed    Diagnosis:  Coronary Stents NSTEMI     After initial evaluation and assessments completed: Virtual Based Care may be provided alone or in conjunction with Phase 2 Cardiac Rehab based on patient barriers.: Yes   Diet - low sodium heart healthy   Complete by: As directed    Discharge instructions   Complete by: As directed    No driving for 48 hours. No lifting over 5 lbs for 1 week. No sexual activity for 1 week. Keep procedure site clean & dry. If you notice increased pain, swelling, bleeding or pus, call/return!  You may shower, but no soaking baths/hot tubs/pools for 1 week.   Increase activity slowly   Complete by: As directed        Discharge Medications   Allergies as of 05/23/2022       Reactions   Hydrocodone Nausea Only   Statins Other (See Comments)   Myalgia   Amaryl [glimepiride] Other (See Comments)   Unknown reaction   Avandia [rosiglitazone] Other (See Comments)   Arthralgia   Crestor [rosuvastatin] Other (See Comments)   Myalgia    Ultram [tramadol] Other (See Comments)   Hallucinations   Welchol [colesevelam] Other (See Comments)   Sore throat   Zestril [lisinopril] Cough   Zetia [ezetimibe] Other (See Comments)   Myalgia    Zorprin [aspirin]    Januvia [sitagliptin] Rash   Metformin And Related  Other (See Comments)   Arthralgia   Vibra-tab [doxycycline] Swelling, Rash   Eye swelling   Wound Dressing Adhesive Rash   Medical tape adhesive        Medication List     TAKE these  medications    Abatacept 125 MG/ML Sosy Inject 1 Dose into the skin every 30 (thirty) days.   acetaminophen 325 MG tablet Commonly known as: TYLENOL Take 2 tablets (650 mg total) by mouth every 6 (six) hours as needed for moderate pain, fever or headache.   acetaminophen 500 MG tablet Commonly known as: TYLENOL Take 1 tablet (500 mg total) by mouth every 6 (six) hours as needed for moderate pain (chest pain).   aspirin 81 MG tablet Take 1 tablet (81 mg total) by mouth daily.   B-D UF III MINI PEN NEEDLES 31G X 5 MM Misc Generic drug: Insulin Pen Needle USE WITH INSULIN TO INJECT BID UTD   clopidogrel 75 MG tablet Commonly known as: PLAVIX Take 1 tablet (75 mg total) by mouth daily with breakfast. Start taking on: May 24, 2022   folic acid 1 MG tablet Commonly known as: FOLVITE Take 1 mg by mouth every evening.   furosemide 40 MG tablet Commonly known as: LASIX Take 1 tablet (40 mg total) by mouth as needed for fluid (wt gain of 3 lbs in 24 hours.).   Insulin Lispro Prot & Lispro (75-25) 100 UNIT/ML Kwikpen Commonly known as: HUMALOG 75/25 MIX Inject 3-5 Units into the skin See admin instructions. Inject 5 units in the morning and 3 units in the evening if needed (high blood sugar)   isosorbide mononitrate 30 MG 24 hr tablet Commonly known as: IMDUR Take 2 tablets (60 mg total) by mouth daily.   losartan 25 MG tablet Commonly known as: COZAAR Take 1 tablet (25 mg total) by mouth daily.   methotrexate 250 MG/10ML injection Inject 10 mg into the muscle every Sunday.   metoprolol tartrate 25 MG tablet Commonly known as: LOPRESSOR Take 0.5 tablets (12.5 mg total) by mouth 2 (two) times daily.   multivitamin with minerals Tabs tablet Take 1 tablet by mouth daily. Centrum  silver   nitroGLYCERIN 0.4 MG SL tablet Commonly known as: NITROSTAT Place 1 tablet (0.4 mg total) under the tongue every 5 (five) minutes as needed for chest pain. What changed: Another medication with the same name was added. Make sure you understand how and when to take each.   nitroGLYCERIN 0.4 MG SL tablet Commonly known as: Nitrostat Place 1 tablet (0.4 mg total) under the tongue every 5 (five) minutes as needed. What changed: You were already taking a medication with the same name, and this prescription was added. Make sure you understand how and when to take each.   OneTouch Verio test strip Generic drug: glucose blood USE 1 STRIP TID UTD   pantoprazole 40 MG tablet Commonly known as: PROTONIX Take 40 mg by mouth daily.   potassium chloride 10 MEQ tablet Commonly known as: KLOR-CON Take 1 tablet (10 mEq total) by mouth as needed (wt gain of 3 lbs in 24 hours to be taken with Lasix).   Vitamin D-3 25 MCG (1000 UT) Caps Take 1,000 Units by mouth daily.           Outstanding Labs/Studies   BMP  Duration of Discharge Encounter   Greater than 30 minutes including physician time.  Signed, Manson Passey, PA 05/23/2022, 9:34 AM   I have seen and examined this patient with Vin Bhagat.  Agree with above, note added to reflect my findings.  Patient admitted to the hospital with unstable angina.  Status post tenting of the LAD through her LIMA graft.  Feeling much improved today.  Plan for discharge with  follow-up in clinic.   Mady Oubre M. Elynor Kallenberger MD 05/23/2022 11:28 AM

## 2022-05-23 NOTE — Progress Notes (Addendum)
Progress Note  Patient Name: [name] [name] Date of Encounter: 05/23/2022  [facility] Cardiologist: [name] Miss, MD   Subjective   Heart catheterization yesterday with stenting of the LAD through the LIMA graft.  Patient is chest pain-free.  Inpatient Medications    Scheduled Meds:  aspirin  81 mg Oral Daily   clopidogrel  75 mg Oral Q breakfast   enoxaparin (LOVENOX) injection  40 mg Subcutaneous Q24H   feeding supplement  237 mL Oral BID BM   insulin aspart  0-15 Units Subcutaneous TID WC   lidocaine  1 patch Transdermal Q24H   metoprolol tartrate  12.5 mg Oral BID   sodium chloride flush  3 mL Intravenous Q12H   Continuous Infusions:  sodium chloride     PRN Meds: sodium chloride, acetaminophen, morphine injection, ondansetron (ZOFRAN) IV, sodium chloride flush   Vital Signs    Vitals:   05/22/22 1350 05/22/22 1420 05/22/22 1948 05/23/22 0404  BP: (!) 128/57 (!) 122/56 (!) 140/55 (!) 112/55  Pulse:  60 67 60  Resp:   16 18  Temp:   98.9 F (37.2 C) 98.8 F (37.1 C)  TempSrc:   Oral Oral  SpO2:  98% 94% 98%  Weight:      Height:        Intake/Output Summary (Last 24 hours) at 05/23/2022 0838 Last data filed at 05/22/2022 1949 Gross per 24 hour  Intake 536.37 ml  Output --  Net 536.37 ml       05/22/2022    3:36 AM 05/21/2022    5:00 AM 05/20/2022    7:09 AM  Last 3 Weights  Weight (lbs) 128 lb 11.2 oz 131 lb 9.8 oz 125 lb  Weight (kg) 58.378 kg 59.7 kg 56.7 kg      Telemetry    Sinus rhythm-personally reviewed  ECG    Sinus rhythm, inferior lateral T wave inversions  Physical Exam   GEN: Well nourished, well developed, in no acute distress  HEENT: normal  Neck: no JVD, carotid bruits, or masses Cardiac: RRR; no murmurs, rubs, or gallops,no edema  Respiratory:  clear to auscultation bilaterally, normal work of breathing GI: soft, nontender, nondistended, + BS MS: no deformity or atrophy  Skin: warm and dry Neuro:  Strength and sensation  are intact Psych: euthymic mood, full affect   Labs    High Sensitivity Troponin:   Recent Labs  Lab 05/06/22 1958 05/06/22 2159 05/07/22 0254  TROPONINIHS 7 8 12       Chemistry Recent Labs  Lab 05/20/22 1529 05/21/22 0038 05/22/22 0244 05/23/22 0225  NA 140 137 137 137  K 4.4 4.4 4.1 4.0  CL 105 105 108 111  CO2 24 24 21* 23  GLUCOSE 117* 204* 203* 124*  BUN 14 13 8 8   CREATININE 0.93 1.21* 0.90 1.10*  CALCIUM 9.2 8.9 8.6* 8.4*  PROT 6.6  --   --   --   ALBUMIN 3.4*  --   --   --   AST 21  --   --   --   ALT 14  --   --   --   ALKPHOS 51  --   --   --   BILITOT 0.9  --   --   --   GFRNONAA >60 45* >60 50*  ANIONGAP 11 8 8  3*     Lipids No results for input(s): "CHOL", "TRIG", "HDL", "LABVLDL", "LDLCALC", "CHOLHDL" in the last 168 hours.  Hematology  Recent Labs  Lab 05/21/22 0038 05/22/22 0244 05/23/22 0225  WBC 6.2 5.5 6.0  RBC 3.78* 3.89 3.38*  HGB 11.0* 11.6* 9.9*  HCT 34.8* 35.6* 30.5*  MCV 92.1 91.5 90.2  MCH 29.1 29.8 29.3  MCHC 31.6 32.6 32.5  RDW 16.5* 16.6* 16.5*  PLT 200 209 177    Thyroid No results for input(s): "TSH", "FREET4" in the last 168 hours.  BNPNo results for input(s): "BNP", "PROBNP" in the last 168 hours.  DDimer No results for input(s): "DDIMER" in the last 168 hours.   Radiology    CARDIAC CATHETERIZATION  Addendum Date: 05/22/2022     Successful PCI of the mid LAD through the LIMA graft with a 2.25 x 24 Synergy drug-eluting stent. Continue dual antiplatelet therapy along with aggressive secondary prevention.  Would watch overnight and plan for discharge tomorrow.  Difficult to engage LIMA with a guide catheter.  Super cross catheter was used to help direct a wire.  Result Date: 05/22/2022   Successful PCI of the mid LAD through the LIMA graft with a 2.25 x 24 Synergy drug-eluting stent. Continue dual antiplatelet therapy along with aggressive secondary prevention.  Would watch overnight and plan for discharge tomorrow.    CARDIAC CATHETERIZATION  Result Date: 05/21/2022   Dist LM to Ost LAD lesion is 95% stenosed.   Mid LM to Dist LM lesion is 60% stenosed.   Prox LAD lesion is 30% stenosed.   Dist LAD lesion is 80% stenosed.   Ost Cx to Prox Cx lesion is 40% stenosed.   Prox RCA lesion is 85% stenosed.   Ost 1st Diag-2 lesion is 75% stenosed.   Ost 1st Diag-1 lesion is 40% stenosed.   Acute Mrg lesion is 90% stenosed.   Mid LAD lesion is 99% stenosed.   Origin to Prox Graft lesion is 100% stenosed.   LIMA graft was visualized by angiography and is normal in caliber.   SVG graft was visualized by angiography and is normal in caliber.   SVG graft was visualized by angiography.   The graft exhibits no disease. Severe distal left main stenosis Severe ostial LAD stenosis. The LIMA graft to the mid LAD is patent but there is a severe stenosis in the mid LAD just beyond the insertion of the IMA graft. Severe apical LAD stenosis. Moderate ostial Circumflex stenosis. Occluded SVG to OM2 Patent SVG to left PDA. The native RCA is a non-dominant vessel with severe proximal vessel stenosis and severe stenosis in the ostium of an RV marginal branch. Failed attempted PCI of the mid LAD through the LIMA graft. Unable to engage a guiding catheter (5 or 6 [place]) in the LIMA from both the left radial approach and the right femoral approach. Recommendations: She has been loaded with Plavix. I think the best approach to attempt PCI of her mid LAD is antegrade through the native system. I was unable to engage the LIMA graft from both the left radial approach and the right groin approach despite multiple attempts. PCI aborted due to radiation dose and contrast load.  [name] [name] plan PCI of the LAD tomorrow if renal function is stable.    Cardiac Studies   Left Heart Cath 05/21/22   Dist LM to Ost LAD lesion is 95% stenosed.   Mid LM to Dist LM lesion is 60% stenosed.   Prox LAD lesion is 30% stenosed.   Dist LAD lesion is 80% stenosed.   Ost Cx to  Prox Cx lesion is 40% stenosed.  Prox RCA lesion is 85% stenosed.   Ost 1st Diag-2 lesion is 75% stenosed.   Ost 1st Diag-1 lesion is 40% stenosed.   Acute Mrg lesion is 90% stenosed.   Mid LAD lesion is 99% stenosed.   Origin to Prox Graft lesion is 100% stenosed.   LIMA graft was visualized by angiography and is normal in caliber.   SVG graft was visualized by angiography and is normal in caliber.   SVG graft was visualized by angiography.   The graft exhibits no disease.   Severe distal left main stenosis Severe ostial LAD stenosis. The LIMA graft to the mid LAD is patent but there is a severe stenosis in the mid LAD just beyond the insertion of the IMA graft. Severe apical LAD stenosis.  Moderate ostial Circumflex stenosis.  Occluded SVG to OM2 Patent SVG to left PDA.  The native RCA is a non-dominant vessel with severe proximal vessel stenosis and severe stenosis in the ostium of an RV marginal branch.  Failed attempted PCI of the mid LAD through the LIMA graft. Unable to engage a guiding catheter (5 or 6 [place]) in the LIMA from both the left radial approach and the right femoral approach.    Recommendations: She has been loaded with Plavix. I think the best approach to attempt PCI of her mid LAD is antegrade through the native system. I was unable to engage the LIMA graft from both the left radial approach and the right groin approach despite multiple attempts. PCI aborted due to radiation dose and contrast load.  Graison Leinberger plan PCI of the LAD tomorrow if renal function is stable.   Diagnostic Dominance: Co-dominant   Echocardiogram 05/07/22   1. Only parasternal views obtained; no apical or subcostal views as pt  refused.   2. Left ventricular ejection fraction, by estimation, is 60 to 65%. The  left ventricle has normal function. The left ventricle has no regional  wall motion abnormalities. Left ventricular diastolic function could not  be evaluated.   3. Right ventricular  systolic function was not well visualized. The right  ventricular size is not well visualized.   4. The mitral valve is normal in structure. Trivial mitral valve  regurgitation. No evidence of mitral stenosis.   5. The aortic valve is tricuspid. Aortic valve regurgitation is not  visualized. No aortic stenosis is present.   6. Mild pulmonic stenosis.   Patient Profile     81 y.o. female with a past medical history of CAD, carotid artery disease, HLD, statin intolerance, rheumatoid arthritis. Presented for outpatient cath on 7/5 that was postponed due to n/v, severe shoulder pain. Underwent cath yesterday, scheduled for staged PCI today.  Assessment & Plan    1.  Unstable angina: Known coronary artery disease Post CABG.  Had continued chest pain.  Post LAD stent.  Chest pain has improved.  Continue aspirin and Plavix.  2.  Hypertension: Currently well controlled  3.  Hyperlipidemia: We Aniken Monestime need lipid clinic referral at discharge.  For questions or updates, please contact [facility] Please consult [email or web address] for contact info under        Signed, Rajean Desantiago Jorja Loa, MD  05/23/2022, 8:38 AM

## 2022-05-23 NOTE — Progress Notes (Signed)
1005CARDIAC REHAB PHASE I   PRE:  Rate/Rhythm: Sinus Rhythm  BP:    Sitting: 134/77     SaO2: 98%   MODE:  Ambulation: 300 ft   POST:  Rate/Rhythem: 57  BP:  Supine: 159/58     SaO2: 100%  1000-1045 Patient walked with assistance times two without complaints of chest pain or shortness of breath. Tolerated well. Assisted back to recliner with call bell within reach. Reviewed exercise instructions, heart healthy diet. Dicuseed the importance of taking Plavix, use of sublingual nitroglycerin. When to call 911. [name] [name] says she is not interested in outpatient cardiac rehab at this time. Referral placed in case she changes her mind. Could not locate stent card to give to patient. RN notified  Thayer Headings RN

## 2022-05-25 ENCOUNTER — Encounter (HOSPITAL_COMMUNITY): Payer: Self-pay | Admitting: Interventional Cardiology

## 2022-05-25 ENCOUNTER — Telehealth: Payer: Self-pay | Admitting: Physician Assistant

## 2022-05-25 NOTE — Telephone Encounter (Signed)
Patient is scheduled for a [facility] hospital f/u per [facility] on 07/13 at 2:20 with [facility].

## 2022-05-25 NOTE — Telephone Encounter (Signed)
Reached out to patient for clarification. She was seen in office 05/12/22 and MAR showed Imdur 30mg  two tablets daily. Patient states she had been taking these as one tablet in the morning and second dose in the evening. I advised her that would be the same daily dose, which is ultimately okay, but Nahser had intended that she take 60mg  as one dose-whether she takes it morning or at bedtime as hospital discharge summary stated, is up to her. She understands and will resume taking this way and discuss further with Vin at her follow-up appt.

## 2022-05-25 NOTE — Telephone Encounter (Signed)
**Note De-Identified  Obfuscation** Patient contacted regarding discharge from [facility] on.05/23/2022  Patient understands to follow up with provider Chelsea Aus, PA-c on 05/28/2022 at 2:20 at [address] [facility] [address] in [place], [place]. Patient understands discharge instructions? Yes  Patient understands medications and regiment? Yes She is requesting to take her Isosorbide 30 mg BID as she was prior to her hospital admission from 7/5-7/8. It was changed to take 2 tablets at bedtime.  She reports that she was having a stressful conversation over the phone concerning the death of a church member last night and her heart began to beat really hard and fast and that she could see her heart beating through her shirt. She states that she placed 1 NTG tablet under her tongue and her heart "calmed down" and she felt better. She states that she felt better when she took her Isosorbide BID and believes that her fast heart beat was due to the change in the way she is taking it.  Patient understands to bring all medications to this visit? Yes  Ask patient:  Are you enrolled in My Chart: Yes  The pt states that other than her "heart racing" last night which was relieved with 1 NTG tablet, she has been feeling "great". She denies having any CP/discomfort, SOB, dizziness, lightheadedness, nausea, diaphoresis, weight gain, or worsening edema.  She is aware that we will call her back with Dr Harvie Bridge recommendation on her Isosorbide dosing.

## 2022-05-26 NOTE — Telephone Encounter (Signed)
Nahser, [name] Ping, MD  You 18 hours ago (5:02 PM)    I agree with the note by Eugene Gavia, RN.  She should take the 60 mg po at the same time and not 30 mg  BID .   PN    Returned call to patient to reiterate above directions. States she will take 60mg  total each morning going forward.

## 2022-05-28 ENCOUNTER — Encounter: Payer: Self-pay | Admitting: Physician Assistant

## 2022-05-28 ENCOUNTER — Ambulatory Visit: Payer: Medicare PPO | Admitting: Physician Assistant

## 2022-05-28 VITALS — BP 112/54 | HR 64 | Ht 61.5 in | Wt 130.4 lb

## 2022-05-28 DIAGNOSIS — E785 Hyperlipidemia, unspecified: Secondary | ICD-10-CM | POA: Diagnosis not present

## 2022-05-28 DIAGNOSIS — Z951 Presence of aortocoronary bypass graft: Secondary | ICD-10-CM

## 2022-05-28 DIAGNOSIS — I10 Essential (primary) hypertension: Secondary | ICD-10-CM | POA: Diagnosis not present

## 2022-05-28 DIAGNOSIS — Z789 Other specified health status: Secondary | ICD-10-CM | POA: Diagnosis not present

## 2022-05-28 DIAGNOSIS — I25110 Atherosclerotic heart disease of native coronary artery with unstable angina pectoris: Secondary | ICD-10-CM

## 2022-05-28 NOTE — Patient Instructions (Signed)
Medication Instructions:  Your physician recommends that you continue on your current medications as directed. Please refer to the Current Medication list given to you today. *If you need a refill on your cardiac medications before your next appointment, please call your pharmacy*   Lab Work: None ordered If you have labs (blood work) drawn today and your tests are completely normal, you will receive your results only by: MyChart Message (if you have MyChart) OR A paper copy in the mail If you have any lab test that is abnormal or we need to change your treatment, we will call you to review the results.   Testing/Procedures: None Ordered    Follow-Up: At [facility], you and your health needs are our priority.  As part of our continuing mission to provide you with exceptional heart care, we have created designated Provider Care Teams.  These Care Teams include your primary Cardiologist (physician) and Advanced Practice Providers (APPs -  Physician Assistants and Nurse Practitioners) who all work together to provide you with the care you need, when you need it.  We recommend signing up for the patient portal called "MyChart".  Sign up information is provided on this After Visit Summary.  MyChart is used to connect with patients for Virtual Visits (Telemedicine).  Patients are able to view lab/test results, encounter notes, upcoming appointments, etc.  Non-urgent messages can be sent to your provider as well.   To learn more about what you can do with MyChart, go to [email or web address].    Your next appointment:   3 month(s)  The format for your next appointment:   In Person  Provider:   Kristeen Miss, MD    PLEASE SCHEDULE A FOLLOW UP WITH THE LIPID CLINIC   Other Instructions   Important Information About Sugar

## 2022-05-28 NOTE — Progress Notes (Signed)
Cardiology Office Note:    Date:  05/28/2022   ID:  [name] [name], DOB 07/13/1941, MRN [id number]  PCP:  Irena Reichmann, DO  [facility] Cardiologist:  Kristeen Miss, MD  [facility] Electrophysiologist:  None   Chief Complaint: hospital follow up   History of Present Illness:    [name] [name] is a 81 y.o. female with a hx of CAD s/p prior intervention and CABG 11/2021, DM, RA, HTN, HLD with statin, zetia & Evolocumab intolerance presented for follow up.   Coronary artery disease  S/p MI 06/1996 tx with POBA to LCx Cath 12/2018: dLAD 80, pRCA 85 (small) >> med Rx; PCI of dLAD only if refractory angina Intol of Toprol (arthralgias) >> changed to Imdur S/p CABG in 1/23 (LIMA-LAD, SVG-OM, SVG-PL)  Echo May 07, 2022: normal LV systolic function ,  EF 60-65% Successful PCI of the mid LAD through the LIMA graft with a 2.25 x 24 Synergy drug-eluting stent 05/22/2022.  Patient was having recurrent chest pain similar to prior angina when seen by Dr. Elease Hashimoto 05/12/22.  Presented for planned outpatient cardiac catheterization 7/5, but patient had severe left shoulder pain, N/V, hypertension. Case was postponed and patient was started on nitro drip due to high BP, chest pain. The patient was admitted. Underwent LHC 7/6 with above findings. Attempted PCI of the mid LAD through LIMA graft but was unsuccessful. Aborted PCI due to radiation dose and contrast load. Followed cath, patient did become hypotensive with BP down to 82/42. IV nitro was stopped, patient was given an 500 cc bolus of NS. BP improved and patient became hypertensive with BP up to 173/69 .  The patient underwent successful PCI of the mid LAD through the LIMA graft with a 2.25 x 24 Synergy drug-eluting stent 05/22/2022.  Here today for follow up.  Patient is doing well since discharge.  Yesterday while at church, she had issue with parking and suddenly got panic and had some chest pain.  Resolved with sublingual nitroglycerin x1.  She  denies exertional chest pain otherwise.  No shortness of breath, orthopnea, PND, syncope, lower extremity edema or melena.   Past Medical History:  Diagnosis Date   CAD (coronary artery disease)    s/p MI in 1997 tx with POBA to LCx // s/p CABG 11/2021   Carotid artery disease (HCC)    [place] 1/23: L 1-39   Diabetes mellitus    Drug therapy    Intermittent steroid use   Dyslipidemia    Edema    Ejection fraction    EF 60%, echo, November, 2011, trivial pericardial effusion // Echo 1/23: EF 60-65   HTN (hypertension) 12/29/2021   Rheumatoid arthritis(714.0)    Hospitalization August, 2011, severe RA flare,    Statin intolerance     Past Surgical History:  Procedure Laterality Date   ABDOMINAL HYSTERECTOMY     CHOLECYSTECTOMY     CORONARY ARTERY BYPASS GRAFT N/A 12/08/2021   Procedure: CORONARY ARTERY BYPASS GRAFTING (CABG) X 3, ON PUMP< USING LEFT INTERNAL MAMMARY ARTERY AND RIGHT ENDOSCOPIC GREATER SAPHENOUS VEIN CONDUITS;  Surgeon: Corliss Skains, MD;  Location: [facility] OR;  Service: Open Heart Surgery;  Laterality: N/A;   CORONARY STENT INTERVENTION N/A 05/22/2022   Procedure: CORONARY STENT INTERVENTION;  Surgeon: Corky Crafts, MD;  Location: [facility] INVASIVE CV LAB;  Service: Cardiovascular;  Laterality: N/A;   ENDOVEIN HARVEST OF GREATER SAPHENOUS VEIN Right 12/08/2021   Procedure: ENDOVEIN HARVEST OF GREATER SAPHENOUS VEIN;  Surgeon: Cliffton Asters,  Eliezer Lofts, MD;  Location: [facility] OR;  Service: Open Heart Surgery;  Laterality: Right;   LEFT HEART CATH AND CORONARY ANGIOGRAPHY N/A 01/10/2019   Procedure: LEFT HEART CATH AND CORONARY ANGIOGRAPHY;  Surgeon: [place], Peter M, MD;  Location: [facility] INVASIVE CV LAB;  Service: Cardiovascular;  Laterality: N/A;   LEFT HEART CATH AND CORONARY ANGIOGRAPHY N/A 12/02/2021   Procedure: LEFT HEART CATH AND CORONARY ANGIOGRAPHY;  Surgeon: Lyn Records, MD;  Location: [facility] INVASIVE CV LAB;  Service: Cardiovascular;  Laterality: N/A;   LEFT HEART CATH AND  CORS/GRAFTS ANGIOGRAPHY N/A 05/20/2022   Procedure: LEFT HEART CATH AND CORS/GRAFTS ANGIOGRAPHY;  Surgeon: Marykay Lex, MD;  Location: [facility] INVASIVE CV LAB;  Service: Cardiovascular;  Laterality: N/A;   LEFT HEART CATH AND CORS/GRAFTS ANGIOGRAPHY N/A 05/21/2022   Procedure: LEFT HEART CATH AND CORS/GRAFTS ANGIOGRAPHY;  Surgeon: Kathleene Hazel, MD;  Location: [facility] INVASIVE CV LAB;  Service: Cardiovascular;  Laterality: N/A;   TEE WITHOUT CARDIOVERSION N/A 12/08/2021   Procedure: TRANSESOPHAGEAL ECHOCARDIOGRAM (TEE);  Surgeon: Corliss Skains, MD;  Location: [facility] OR;  Service: Open Heart Surgery;  Laterality: N/A;    Current Medications: Current Meds  Medication Sig   Abatacept 125 MG/ML SOSY Inject 1 Dose into the skin every 30 (thirty) days.   acetaminophen (TYLENOL) 325 MG tablet Take 2 tablets (650 mg total) by mouth every 6 (six) hours as needed for moderate pain, fever or headache.   aspirin 81 MG tablet Take 1 tablet (81 mg total) by mouth daily.   B-D UF III MINI PEN NEEDLES 31G X 5 MM MISC USE WITH INSULIN TO INJECT BID UTD   Cholecalciferol (VITAMIN D-3) 1000 units CAPS Take 1,000 Units by mouth daily.    clopidogrel (PLAVIX) 75 MG tablet Take 1 tablet (75 mg total) by mouth daily with breakfast.   folic acid (FOLVITE) 1 MG tablet Take 1 mg by mouth every evening.    furosemide (LASIX) 40 MG tablet Take 1 tablet (40 mg total) by mouth as needed for fluid (wt gain of 3 lbs in 24 hours.).   Insulin Lispro Prot & Lispro (HUMALOG 75/25 MIX) (75-25) 100 UNIT/ML Kwikpen Inject 3-5 Units into the skin See admin instructions. Inject 5 units in the morning and 3 units in the evening if needed (high blood sugar)   isosorbide mononitrate (IMDUR) 30 MG 24 hr tablet Take 2 tablets (60 mg total) by mouth daily.   losartan (COZAAR) 25 MG tablet Take 1 tablet (25 mg total) by mouth daily.   methotrexate 250 MG/10ML injection Inject 10 mg into the muscle every Sunday.   metoprolol tartrate  (LOPRESSOR) 25 MG tablet Take 0.5 tablets (12.5 mg total) by mouth 2 (two) times daily.   Multiple Vitamin (MULTIVITAMIN WITH MINERALS) TABS tablet Take 1 tablet by mouth daily. Centrum silver   nitroGLYCERIN (NITROSTAT) 0.4 MG SL tablet Place 1 tablet (0.4 mg total) under the tongue every 5 (five) minutes as needed.   ONETOUCH VERIO test strip USE 1 STRIP TID UTD   pantoprazole (PROTONIX) 40 MG tablet Take 40 mg by mouth daily.   potassium chloride (KLOR-CON) 10 MEQ tablet Take 1 tablet (10 mEq total) by mouth as needed (wt gain of 3 lbs in 24 hours to be taken with Lasix).   traMADol (ULTRAM) 50 MG tablet Take 1 tablet by mouth as needed.     Allergies:   Hydrocodone, Statins, Amaryl [glimepiride], Avandia [rosiglitazone], Crestor [rosuvastatin], Ultram [tramadol], Welchol [colesevelam], Zestril [lisinopril], Zetia [  ezetimibe], Zorprin [aspirin], Januvia [sitagliptin], Metformin and related, Vibra-tab [doxycycline], and Wound dressing adhesive   Social History   Socioeconomic History   Marital status: Widowed    Spouse name: Not on file   Number of children: Not on file   Years of education: Not on file   Highest education level: Not on file  Occupational History   Not on file  Tobacco Use   Smoking status: Former    Types: Cigarettes    Quit date: 11/16/1994    Years since quitting: 27.5   Smokeless tobacco: Never  Vaping Use   Vaping Use: Never used  Substance and Sexual Activity   Alcohol use: No    Alcohol/week: 0.0 standard drinks of alcohol   Drug use: No   Sexual activity: Not Currently  Other Topics Concern   Not on file  Social History Narrative   Not on file   Social Determinants of Health   Financial Resource Strain: Not on file  Food Insecurity: Not on file  Transportation Needs: Not on file  Physical Activity: Not on file  Stress: Not on file  Social Connections: Not on file     Family History: The patient's family history includes Diabetes in her  mother; Hypertension in her mother; Other in her father.    ROS:   Please see the history of present illness.    All other systems reviewed and are negative.   EKGs/Labs/Other Studies Reviewed:    The following studies were reviewed today:  CORONARY STENT INTERVENTION  05/22/2022    Conclusion       Successful PCI of the mid LAD through the LIMA graft with a 2.25 x 24 Synergy drug-eluting stent.   Continue dual antiplatelet therapy along with aggressive secondary prevention.  Would watch overnight and plan for discharge tomorrow.     Difficult to engage LIMA with a guide catheter.  Super cross catheter was used to help direct a wire.   Diagnostic Dominance: Co-dominant  Intervention    Left Heart Cath 05/21/22   Dist LM to Ost LAD lesion is 95% stenosed.   Mid LM to Dist LM lesion is 60% stenosed.   Prox LAD lesion is 30% stenosed.   Dist LAD lesion is 80% stenosed.   Ost Cx to Prox Cx lesion is 40% stenosed.   Prox RCA lesion is 85% stenosed.   Ost 1st Diag-2 lesion is 75% stenosed.   Ost 1st Diag-1 lesion is 40% stenosed.   Acute Mrg lesion is 90% stenosed.   Mid LAD lesion is 99% stenosed.   Origin to Prox Graft lesion is 100% stenosed.   LIMA graft was visualized by angiography and is normal in caliber.   SVG graft was visualized by angiography and is normal in caliber.   SVG graft was visualized by angiography.   The graft exhibits no disease.   Severe distal left main stenosis Severe ostial LAD stenosis. The LIMA graft to the mid LAD is patent but there is a severe stenosis in the mid LAD just beyond the insertion of the IMA graft. Severe apical LAD stenosis.  Moderate ostial Circumflex stenosis.  Occluded SVG to OM2 Patent SVG to left PDA.  The native RCA is a non-dominant vessel with severe proximal vessel stenosis and severe stenosis in the ostium of an RV marginal branch.  Failed attempted PCI of the mid LAD through the LIMA graft. Unable to engage a guiding  catheter (5 or 6 [place]) in the LIMA from  both the left radial approach and the right femoral approach.    Recommendations: She has been loaded with Plavix. I think the best approach to attempt PCI of her mid LAD is antegrade through the native system. I was unable to engage the LIMA graft from both the left radial approach and the right groin approach despite multiple attempts. PCI aborted due to radiation dose and contrast load.  Will plan PCI of the LAD tomorrow if renal function is stable.    Diagnostic Dominance: Co-dominant      EKG:  EKG is not  ordered today.   Recent Labs: 03/19/2022: Magnesium 2.0; TSH 1.600 05/20/2022: ALT 14 05/23/2022: BUN 8; Creatinine, Ser 1.10; Hemoglobin 9.9; Platelets 177; Potassium 4.0; Sodium 137  Recent Lipid Panel    Component Value Date/Time   CHOL 170 11/27/2021 1120   TRIG 37 11/27/2021 1120   HDL 73 11/27/2021 1120   CHOLHDL 2.3 11/27/2021 1120   LDLCALC 89 11/27/2021 1120    Physical Exam:    VS:  BP (!) 112/54   Pulse 64   Ht 5' 1.5" (1.562 m)   Wt 130 lb 6.4 oz (59.1 kg)   SpO2 99%   BMI 24.24 kg/m     Wt Readings from Last 3 Encounters:  05/28/22 130 lb 6.4 oz (59.1 kg)  05/22/22 128 lb 11.2 oz (58.4 kg)  05/12/22 130 lb (59 kg)     GEN:  Well nourished, well developed in no acute distress HEENT: Normal NECK: No JVD; No carotid bruits LYMPHATICS: No lymphadenopathy CARDIAC: RRR, no murmurs, rubs, gallops. Mild ecchymosis at cath site.  RESPIRATORY:  Clear to auscultation without rales, wheezing or rhonchi  ABDOMEN: Soft, non-tender, non-distended MUSCULOSKELETAL:  No edema; No deformity  SKIN: Warm and dry NEUROLOGIC:  Alert and oriented x 3 PSYCHIATRIC:  Normal affect   ASSESSMENT AND PLAN:    CAD -Cath result as above.  Underwent PCI to LAD.  Continue aspirin, Plavix, beta-blocker and ARB.  2. HTN Blood pressure stable on current medication  3. HLD -11/27/2021: Cholesterol, Total 170; HDL 73; LDL Chol Calc (NIH)  89; Triglycerides 37  -Intolerance to multiple medication.  Refer to lipid clinic.   4.  AKI -Serum creatinine was 1.1 at discharge.  Declined blood work today.  Medication Adjustments/Labs and Tests Ordered: Current medicines are reviewed at length with the patient today.  Concerns regarding medicines are outlined above.  No orders of the defined types were placed in this encounter.  No orders of the defined types were placed in this encounter.   Patient Instructions  Medication Instructions:  Your physician recommends that you continue on your current medications as directed. Please refer to the Current Medication list given to you today. *If you need a refill on your cardiac medications before your next appointment, please call your pharmacy*   Lab Work: None ordered If you have labs (blood work) drawn today and your tests are completely normal, you will receive your results only by: MyChart Message (if you have MyChart) OR A paper copy in the mail If you have any lab test that is abnormal or we need to change your treatment, we will call you to review the results.   Testing/Procedures: None Ordered    Follow-Up: At [facility], you and your health needs are our priority.  As part of our continuing mission to provide you with exceptional heart care, we have created designated Provider Care Teams.  These Care Teams include your primary Cardiologist (  physician) and Advanced Practice Providers (APPs -  Physician Assistants and Nurse Practitioners) who all work together to provide you with the care you need, when you need it.  We recommend signing up for the patient portal called "MyChart".  Sign up information is provided on this After Visit Summary.  MyChart is used to connect with patients for Virtual Visits (Telemedicine).  Patients are able to view lab/test results, encounter notes, upcoming appointments, etc.  Non-urgent messages can be sent to your provider as well.   To  learn more about what you can do with MyChart, go to [email or web address].    Your next appointment:   3 month(s)  The format for your next appointment:   In Person  Provider:   Kristeen Miss, MD    PLEASE SCHEDULE A FOLLOW UP WITH THE LIPID CLINIC   Other Instructions   Important Information About Sugar         Lorelei Pont, PA  05/28/2022 2:35 PM    [facility]

## 2022-06-02 ENCOUNTER — Telehealth (HOSPITAL_COMMUNITY): Payer: Self-pay

## 2022-06-02 NOTE — Telephone Encounter (Signed)
Pt returned phone call and stated that she is not interested in the cardiac rehab program at this time. Closed referral.

## 2022-06-05 DIAGNOSIS — Z955 Presence of coronary angioplasty implant and graft: Secondary | ICD-10-CM | POA: Diagnosis not present

## 2022-06-05 DIAGNOSIS — I2510 Atherosclerotic heart disease of native coronary artery without angina pectoris: Secondary | ICD-10-CM | POA: Diagnosis not present

## 2022-06-05 DIAGNOSIS — Z09 Encounter for follow-up examination after completed treatment for conditions other than malignant neoplasm: Secondary | ICD-10-CM | POA: Diagnosis not present

## 2022-06-05 DIAGNOSIS — Z79899 Other long term (current) drug therapy: Secondary | ICD-10-CM | POA: Diagnosis not present

## 2022-06-05 DIAGNOSIS — Z951 Presence of aortocoronary bypass graft: Secondary | ICD-10-CM | POA: Diagnosis not present

## 2022-07-07 DIAGNOSIS — Z79899 Other long term (current) drug therapy: Secondary | ICD-10-CM | POA: Diagnosis not present

## 2022-07-07 DIAGNOSIS — I10 Essential (primary) hypertension: Secondary | ICD-10-CM | POA: Diagnosis not present

## 2022-07-07 DIAGNOSIS — I2510 Atherosclerotic heart disease of native coronary artery without angina pectoris: Secondary | ICD-10-CM | POA: Diagnosis not present

## 2022-07-07 DIAGNOSIS — M170 Bilateral primary osteoarthritis of knee: Secondary | ICD-10-CM | POA: Diagnosis not present

## 2022-07-07 DIAGNOSIS — M79641 Pain in right hand: Secondary | ICD-10-CM | POA: Diagnosis not present

## 2022-07-07 DIAGNOSIS — E119 Type 2 diabetes mellitus without complications: Secondary | ICD-10-CM | POA: Diagnosis not present

## 2022-07-07 DIAGNOSIS — M810 Age-related osteoporosis without current pathological fracture: Secondary | ICD-10-CM | POA: Diagnosis not present

## 2022-07-07 DIAGNOSIS — E785 Hyperlipidemia, unspecified: Secondary | ICD-10-CM | POA: Diagnosis not present

## 2022-07-07 DIAGNOSIS — M0589 Other rheumatoid arthritis with rheumatoid factor of multiple sites: Secondary | ICD-10-CM | POA: Diagnosis not present

## 2022-07-08 NOTE — Addendum Note (Signed)
Addended by: Kandice Robinsons T on: 07/08/2022 12:05 PM   Modules accepted: Orders

## 2022-07-09 ENCOUNTER — Telehealth: Payer: Self-pay | Admitting: Cardiovascular Disease

## 2022-07-09 NOTE — Telephone Encounter (Signed)
Returned call to patient. She reports she has felt her heart rate increase when she takes her medications in the morning. She states it starts about 2-3 minutes after taking her medications and lasts only for a "few minutes" before returning to normal.  She denies any other episodes of increased HR, only in the mornings when she takes all of her medications at one time.  Patient states she takes Imdur 30mg  BID instead of 60mg  QD. Patient had previously been advised to take 60mg  once daily, but she decided to split it into two doses to help with the sensation of elevated HR. She reports she has not experienced the elevated HR since doing this.  Patient states her rheumatologist advised her to speak with her cardiologist to see if any medications need to be adjusted before starting her Orencia infusion.  Will forward to Dr. to review and advise.

## 2022-07-09 NOTE — Telephone Encounter (Signed)
  STAT if HR is under 50 or over 120 (normal HR is 60-100 beats per minute)  What is your heart rate? 125/83 71  Do you have a log of your heart rate readings (document readings)?   Do you have any other symptoms?   Pt said, she is having issue with her heart beating fast. She noticed this happened when she takes her medications all at once in the morning, so yesterday and today she spread out her medication and did not feel anything. She was told by her pcp to call her heart doctor

## 2022-07-10 NOTE — Telephone Encounter (Signed)
Spoke with patient and shared Dr. Harvie Bridge comment regarding yesterday's conversation:  These palpitations sound benign.  Make sure she is hydrating well ( perhaps a glass of water in the morning even before taking meds )   Thanks   PN   Patient verbalized understanding and expressed appreciation for follow-up.

## 2022-07-15 DIAGNOSIS — M0589 Other rheumatoid arthritis with rheumatoid factor of multiple sites: Secondary | ICD-10-CM | POA: Diagnosis not present

## 2022-07-29 ENCOUNTER — Encounter: Payer: Self-pay | Admitting: Cardiovascular Disease
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
## 2022-07-30 ENCOUNTER — Ambulatory Visit: Payer: Medicare PPO | Attending: Cardiovascular Disease | Admitting: Cardiovascular Disease

## 2022-07-30 ENCOUNTER — Encounter: Payer: Self-pay | Admitting: Cardiovascular Disease

## 2022-07-30 VITALS — BP 144/74 | HR 74 | Ht 61.5 in | Wt 131.8 lb

## 2022-07-30 DIAGNOSIS — I2510 Atherosclerotic heart disease of native coronary artery without angina pectoris: Secondary | ICD-10-CM

## 2022-07-30 DIAGNOSIS — E785 Hyperlipidemia, unspecified: Secondary | ICD-10-CM | POA: Diagnosis not present

## 2022-07-30 LAB — LIPID PANEL
Chol/HDL Ratio: 2.3 ratio (ref 0.0–4.4)
Cholesterol, Total: 161 mg/dL (ref 100–199)
HDL: 70 mg/dL (ref >39)
LDL Chol Calc (NIH): 83 mg/dL (ref 0–99)
Triglycerides: 33 mg/dL (ref 0–149)
VLDL Cholesterol Cal: 8 mg/dL (ref 5–40)

## 2022-07-30 LAB — ALT: ALT: 12 IU/L (ref 0–32)

## 2022-07-30 NOTE — Patient Instructions (Signed)
Medication Instructions:  Your physician recommends that you continue on your current medications as directed. Please refer to the Current Medication list given to you today.  *If you need a refill on your cardiac medications before your next appointment, please call your pharmacy*   Lab Work: Lipids, ALT Today If you have labs (blood work) drawn today and your tests are completely normal, you will receive your results only by: MyChart Message (if you have MyChart) OR A paper copy in the mail If you have any lab test that is abnormal or we need to change your treatment, we will call you to review the results.   Testing/Procedures: NONE   Follow-Up: At [facility], you and your health needs are our priority.  As part of our continuing mission to provide you with exceptional heart care, we have created designated Provider Care Teams.  These Care Teams include your primary Cardiologist (physician) and Advanced Practice Providers (APPs -  Physician Assistants and Nurse Practitioners) who all work together to provide you with the care you need, when you need it.  Your next appointment:   1 year(s)  The format for your next appointment:   In Person  Provider:   Kristeen Miss, MD       Important Information About Sugar

## 2022-08-04 ENCOUNTER — Telehealth: Payer: Self-pay | Admitting: Cardiovascular Disease

## 2022-08-04 NOTE — Telephone Encounter (Signed)
Already resolved-see result note from today.

## 2022-08-04 NOTE — Telephone Encounter (Signed)
Patient is returning call to discuss lab results. 

## 2022-08-12 ENCOUNTER — Ambulatory Visit: Payer: Medicare PPO

## 2022-08-12 DIAGNOSIS — M0589 Other rheumatoid arthritis with rheumatoid factor of multiple sites: Secondary | ICD-10-CM | POA: Diagnosis not present

## 2022-08-20 DIAGNOSIS — E1159 Type 2 diabetes mellitus with other circulatory complications: Secondary | ICD-10-CM | POA: Diagnosis not present

## 2022-08-20 DIAGNOSIS — Z0000 Encounter for general adult medical examination without abnormal findings: Secondary | ICD-10-CM | POA: Diagnosis not present

## 2022-08-20 DIAGNOSIS — Z23 Encounter for immunization: Secondary | ICD-10-CM | POA: Diagnosis not present

## 2022-08-20 DIAGNOSIS — M0589 Other rheumatoid arthritis with rheumatoid factor of multiple sites: Secondary | ICD-10-CM | POA: Diagnosis not present

## 2022-08-20 DIAGNOSIS — K219 Gastro-esophageal reflux disease without esophagitis: Secondary | ICD-10-CM | POA: Diagnosis not present

## 2022-08-20 DIAGNOSIS — M170 Bilateral primary osteoarthritis of knee: Secondary | ICD-10-CM | POA: Diagnosis not present

## 2022-08-20 DIAGNOSIS — Z789 Other specified health status: Secondary | ICD-10-CM | POA: Diagnosis not present

## 2022-08-20 DIAGNOSIS — Z951 Presence of aortocoronary bypass graft: Secondary | ICD-10-CM | POA: Diagnosis not present

## 2022-08-20 DIAGNOSIS — Z955 Presence of coronary angioplasty implant and graft: Secondary | ICD-10-CM | POA: Diagnosis not present

## 2022-09-09 DIAGNOSIS — M0589 Other rheumatoid arthritis with rheumatoid factor of multiple sites: Secondary | ICD-10-CM | POA: Diagnosis not present

## 2022-09-23 DIAGNOSIS — Z23 Encounter for immunization: Secondary | ICD-10-CM | POA: Diagnosis not present

## 2022-09-23 DIAGNOSIS — I10 Essential (primary) hypertension: Secondary | ICD-10-CM | POA: Diagnosis not present

## 2022-09-23 DIAGNOSIS — I129 Hypertensive chronic kidney disease with stage 1 through stage 4 chronic kidney disease, or unspecified chronic kidney disease: Secondary | ICD-10-CM | POA: Diagnosis not present

## 2022-09-23 DIAGNOSIS — Z955 Presence of coronary angioplasty implant and graft: Secondary | ICD-10-CM | POA: Diagnosis not present

## 2022-09-23 DIAGNOSIS — E785 Hyperlipidemia, unspecified: Secondary | ICD-10-CM | POA: Diagnosis not present

## 2022-09-23 DIAGNOSIS — E1159 Type 2 diabetes mellitus with other circulatory complications: Secondary | ICD-10-CM | POA: Diagnosis not present

## 2022-10-02 ENCOUNTER — Ambulatory Visit: Payer: Medicare PPO | Admitting: Cardiovascular Disease

## 2022-10-07 DIAGNOSIS — M0589 Other rheumatoid arthritis with rheumatoid factor of multiple sites: Secondary | ICD-10-CM | POA: Diagnosis not present

## 2022-10-18 DIAGNOSIS — K5730 Diverticulosis of large intestine without perforation or abscess without bleeding: Secondary | ICD-10-CM | POA: Diagnosis not present

## 2022-10-18 DIAGNOSIS — Z90710 Acquired absence of both cervix and uterus: Secondary | ICD-10-CM | POA: Diagnosis not present

## 2022-10-18 DIAGNOSIS — M25551 Pain in right hip: Secondary | ICD-10-CM | POA: Diagnosis not present

## 2022-10-18 DIAGNOSIS — W01198A Fall on same level from slipping, tripping and stumbling with subsequent striking against other object, initial encounter: Secondary | ICD-10-CM | POA: Diagnosis not present

## 2022-10-18 DIAGNOSIS — S7001XA Contusion of right hip, initial encounter: Secondary | ICD-10-CM | POA: Diagnosis not present

## 2022-10-18 DIAGNOSIS — Y998 Other external cause status: Secondary | ICD-10-CM | POA: Diagnosis not present

## 2022-11-05 DIAGNOSIS — I2510 Atherosclerotic heart disease of native coronary artery without angina pectoris: Secondary | ICD-10-CM | POA: Diagnosis not present

## 2022-11-05 DIAGNOSIS — Z955 Presence of coronary angioplasty implant and graft: Secondary | ICD-10-CM | POA: Diagnosis not present

## 2022-11-05 DIAGNOSIS — E785 Hyperlipidemia, unspecified: Secondary | ICD-10-CM | POA: Diagnosis not present

## 2022-11-05 DIAGNOSIS — I129 Hypertensive chronic kidney disease with stage 1 through stage 4 chronic kidney disease, or unspecified chronic kidney disease: Secondary | ICD-10-CM | POA: Diagnosis not present

## 2022-11-05 DIAGNOSIS — E1159 Type 2 diabetes mellitus with other circulatory complications: Secondary | ICD-10-CM | POA: Diagnosis not present

## 2022-11-05 DIAGNOSIS — I10 Essential (primary) hypertension: Secondary | ICD-10-CM | POA: Diagnosis not present

## 2022-11-12 DIAGNOSIS — M0589 Other rheumatoid arthritis with rheumatoid factor of multiple sites: Secondary | ICD-10-CM | POA: Diagnosis not present

## 2022-11-18 DIAGNOSIS — M170 Bilateral primary osteoarthritis of knee: Secondary | ICD-10-CM | POA: Diagnosis not present

## 2022-11-18 DIAGNOSIS — E785 Hyperlipidemia, unspecified: Secondary | ICD-10-CM | POA: Diagnosis not present

## 2022-11-18 DIAGNOSIS — M79641 Pain in right hand: Secondary | ICD-10-CM | POA: Diagnosis not present

## 2022-11-18 DIAGNOSIS — M810 Age-related osteoporosis without current pathological fracture: Secondary | ICD-10-CM | POA: Diagnosis not present

## 2022-11-18 DIAGNOSIS — I2510 Atherosclerotic heart disease of native coronary artery without angina pectoris: Secondary | ICD-10-CM | POA: Diagnosis not present

## 2022-11-18 DIAGNOSIS — I10 Essential (primary) hypertension: Secondary | ICD-10-CM | POA: Diagnosis not present

## 2022-11-18 DIAGNOSIS — M0589 Other rheumatoid arthritis with rheumatoid factor of multiple sites: Secondary | ICD-10-CM | POA: Diagnosis not present

## 2022-11-18 DIAGNOSIS — Z79899 Other long term (current) drug therapy: Secondary | ICD-10-CM | POA: Diagnosis not present

## 2022-11-18 DIAGNOSIS — M79644 Pain in right finger(s): Secondary | ICD-10-CM | POA: Diagnosis not present

## 2022-11-23 DIAGNOSIS — Z79899 Other long term (current) drug therapy: Secondary | ICD-10-CM | POA: Diagnosis not present

## 2022-12-10 DIAGNOSIS — M0589 Other rheumatoid arthritis with rheumatoid factor of multiple sites: Secondary | ICD-10-CM | POA: Diagnosis not present

## 2023-01-11 DIAGNOSIS — M0589 Other rheumatoid arthritis with rheumatoid factor of multiple sites: Secondary | ICD-10-CM | POA: Diagnosis not present

## 2023-01-23 ENCOUNTER — Other Ambulatory Visit: Payer: Self-pay | Admitting: Nurse Practitioner

## 2023-01-28 ENCOUNTER — Other Ambulatory Visit: Payer: Self-pay

## 2023-01-28 MED ORDER — LOSARTAN POTASSIUM 25 MG PO TABS: 25.0000 mg | ORAL_TABLET | Freq: Every day | ORAL | 1 refills | Status: DC

## 2023-02-02 DIAGNOSIS — E785 Hyperlipidemia, unspecified: Secondary | ICD-10-CM | POA: Diagnosis not present

## 2023-02-02 DIAGNOSIS — I10 Essential (primary) hypertension: Secondary | ICD-10-CM | POA: Diagnosis not present

## 2023-02-02 DIAGNOSIS — Z951 Presence of aortocoronary bypass graft: Secondary | ICD-10-CM | POA: Diagnosis not present

## 2023-02-02 DIAGNOSIS — I129 Hypertensive chronic kidney disease with stage 1 through stage 4 chronic kidney disease, or unspecified chronic kidney disease: Secondary | ICD-10-CM | POA: Diagnosis not present

## 2023-02-02 DIAGNOSIS — E1159 Type 2 diabetes mellitus with other circulatory complications: Secondary | ICD-10-CM | POA: Diagnosis not present

## 2023-02-03 DIAGNOSIS — M170 Bilateral primary osteoarthritis of knee: Secondary | ICD-10-CM | POA: Diagnosis not present

## 2023-02-03 DIAGNOSIS — E119 Type 2 diabetes mellitus without complications: Secondary | ICD-10-CM | POA: Diagnosis not present

## 2023-02-03 DIAGNOSIS — M0589 Other rheumatoid arthritis with rheumatoid factor of multiple sites: Secondary | ICD-10-CM | POA: Diagnosis not present

## 2023-02-03 DIAGNOSIS — I2510 Atherosclerotic heart disease of native coronary artery without angina pectoris: Secondary | ICD-10-CM | POA: Diagnosis not present

## 2023-02-03 DIAGNOSIS — E785 Hyperlipidemia, unspecified: Secondary | ICD-10-CM | POA: Diagnosis not present

## 2023-02-03 DIAGNOSIS — Z79899 Other long term (current) drug therapy: Secondary | ICD-10-CM | POA: Diagnosis not present

## 2023-02-03 DIAGNOSIS — I10 Essential (primary) hypertension: Secondary | ICD-10-CM | POA: Diagnosis not present

## 2023-02-03 DIAGNOSIS — M79641 Pain in right hand: Secondary | ICD-10-CM | POA: Diagnosis not present

## 2023-02-03 DIAGNOSIS — M810 Age-related osteoporosis without current pathological fracture: Secondary | ICD-10-CM | POA: Diagnosis not present

## 2023-02-08 ENCOUNTER — Other Ambulatory Visit: Payer: Self-pay | Admitting: *Deleted

## 2023-02-08 DIAGNOSIS — M0589 Other rheumatoid arthritis with rheumatoid factor of multiple sites: Secondary | ICD-10-CM | POA: Diagnosis not present

## 2023-02-08 MED ORDER — ISOSORBIDE MONONITRATE ER 30 MG PO TB24
60.0000 mg | ORAL_TABLET | Freq: Every day | ORAL | 1 refills | Status: DC
Start: 2023-02-08 — End: 2023-05-25

## 2023-02-18 DIAGNOSIS — Z951 Presence of aortocoronary bypass graft: Secondary | ICD-10-CM | POA: Diagnosis not present

## 2023-02-18 DIAGNOSIS — E785 Hyperlipidemia, unspecified: Secondary | ICD-10-CM | POA: Diagnosis not present

## 2023-02-18 DIAGNOSIS — E1159 Type 2 diabetes mellitus with other circulatory complications: Secondary | ICD-10-CM | POA: Diagnosis not present

## 2023-02-18 DIAGNOSIS — K219 Gastro-esophageal reflux disease without esophagitis: Secondary | ICD-10-CM | POA: Diagnosis not present

## 2023-02-18 DIAGNOSIS — Z789 Other specified health status: Secondary | ICD-10-CM | POA: Diagnosis not present

## 2023-02-25 DIAGNOSIS — K219 Gastro-esophageal reflux disease without esophagitis: Secondary | ICD-10-CM | POA: Diagnosis not present

## 2023-02-25 DIAGNOSIS — G720 Drug-induced myopathy: Secondary | ICD-10-CM | POA: Diagnosis not present

## 2023-02-25 DIAGNOSIS — E785 Hyperlipidemia, unspecified: Secondary | ICD-10-CM | POA: Diagnosis not present

## 2023-02-25 DIAGNOSIS — R946 Abnormal results of thyroid function studies: Secondary | ICD-10-CM | POA: Diagnosis not present

## 2023-02-25 DIAGNOSIS — Z951 Presence of aortocoronary bypass graft: Secondary | ICD-10-CM | POA: Diagnosis not present

## 2023-02-25 DIAGNOSIS — E1159 Type 2 diabetes mellitus with other circulatory complications: Secondary | ICD-10-CM | POA: Diagnosis not present

## 2023-02-25 DIAGNOSIS — E559 Vitamin D deficiency, unspecified: Secondary | ICD-10-CM | POA: Diagnosis not present

## 2023-02-25 DIAGNOSIS — M170 Bilateral primary osteoarthritis of knee: Secondary | ICD-10-CM | POA: Diagnosis not present

## 2023-02-25 DIAGNOSIS — F411 Generalized anxiety disorder: Secondary | ICD-10-CM | POA: Diagnosis not present

## 2023-02-26 ENCOUNTER — Other Ambulatory Visit: Payer: Self-pay | Admitting: Family Medicine

## 2023-02-26 DIAGNOSIS — Z1231 Encounter for screening mammogram for malignant neoplasm of breast: Secondary | ICD-10-CM

## 2023-03-08 DIAGNOSIS — M0589 Other rheumatoid arthritis with rheumatoid factor of multiple sites: Secondary | ICD-10-CM | POA: Diagnosis not present

## 2023-04-05 DIAGNOSIS — M0589 Other rheumatoid arthritis with rheumatoid factor of multiple sites: Secondary | ICD-10-CM | POA: Diagnosis not present

## 2023-04-06 ENCOUNTER — Ambulatory Visit
Admission: RE | Admit: 2023-04-06 | Discharge: 2023-04-06 | Disposition: A | Discharge status: Home / Self Care | Payer: Medicare PPO | Source: Ambulatory Visit | Attending: Family Medicine | Admitting: Family Medicine

## 2023-04-06 DIAGNOSIS — Z1231 Encounter for screening mammogram for malignant neoplasm of breast: Secondary | ICD-10-CM | POA: Diagnosis not present

## 2023-05-03 DIAGNOSIS — M0589 Other rheumatoid arthritis with rheumatoid factor of multiple sites: Secondary | ICD-10-CM | POA: Diagnosis not present

## 2023-05-25 ENCOUNTER — Other Ambulatory Visit: Payer: Self-pay

## 2023-05-25 MED ORDER — ISOSORBIDE MONONITRATE ER 30 MG PO TB24: 60.0000 mg | ORAL_TABLET | Freq: Every day | ORAL | 0 refills | Status: DC

## 2023-05-25 NOTE — Telephone Encounter (Signed)
Pt's medication was sent to pt's pharmacy as requested. Confirmation received.  °

## 2023-05-26 DIAGNOSIS — E119 Type 2 diabetes mellitus without complications: Secondary | ICD-10-CM | POA: Diagnosis not present

## 2023-05-26 DIAGNOSIS — I10 Essential (primary) hypertension: Secondary | ICD-10-CM | POA: Diagnosis not present

## 2023-05-26 DIAGNOSIS — I2510 Atherosclerotic heart disease of native coronary artery without angina pectoris: Secondary | ICD-10-CM | POA: Diagnosis not present

## 2023-05-26 DIAGNOSIS — M170 Bilateral primary osteoarthritis of knee: Secondary | ICD-10-CM | POA: Diagnosis not present

## 2023-05-26 DIAGNOSIS — Z79899 Other long term (current) drug therapy: Secondary | ICD-10-CM | POA: Diagnosis not present

## 2023-05-26 DIAGNOSIS — M810 Age-related osteoporosis without current pathological fracture: Secondary | ICD-10-CM | POA: Diagnosis not present

## 2023-05-26 DIAGNOSIS — M0589 Other rheumatoid arthritis with rheumatoid factor of multiple sites: Secondary | ICD-10-CM | POA: Diagnosis not present

## 2023-05-26 DIAGNOSIS — E785 Hyperlipidemia, unspecified: Secondary | ICD-10-CM | POA: Diagnosis not present

## 2023-05-26 DIAGNOSIS — M25569 Pain in unspecified knee: Secondary | ICD-10-CM | POA: Diagnosis not present

## 2023-05-31 DIAGNOSIS — M0589 Other rheumatoid arthritis with rheumatoid factor of multiple sites: Secondary | ICD-10-CM | POA: Diagnosis not present

## 2023-06-02 ENCOUNTER — Other Ambulatory Visit: Payer: Self-pay

## 2023-06-02 MED ORDER — CLOPIDOGREL BISULFATE 75 MG PO TABS: 75.0000 mg | ORAL_TABLET | Freq: Every day | ORAL | 1 refills | Status: DC

## 2023-06-28 DIAGNOSIS — M0589 Other rheumatoid arthritis with rheumatoid factor of multiple sites: Secondary | ICD-10-CM | POA: Diagnosis not present

## 2023-07-26 DIAGNOSIS — M0589 Other rheumatoid arthritis with rheumatoid factor of multiple sites: Secondary | ICD-10-CM | POA: Diagnosis not present

## 2023-07-29 ENCOUNTER — Other Ambulatory Visit: Payer: Self-pay | Admitting: Cardiovascular Disease

## 2023-08-02 ENCOUNTER — Other Ambulatory Visit: Payer: Self-pay | Admitting: Cardiovascular Disease

## 2023-08-02 ENCOUNTER — Other Ambulatory Visit: Payer: Self-pay | Admitting: *Deleted

## 2023-08-02 MED ORDER — METOPROLOL TARTRATE 25 MG PO TABS: 25.0000 mg | ORAL_TABLET | Freq: Two times a day (BID) | ORAL | 0 refills | Status: DC

## 2023-08-19 DIAGNOSIS — Z951 Presence of aortocoronary bypass graft: Secondary | ICD-10-CM | POA: Diagnosis not present

## 2023-08-19 DIAGNOSIS — E785 Hyperlipidemia, unspecified: Secondary | ICD-10-CM | POA: Diagnosis not present

## 2023-08-19 DIAGNOSIS — E1159 Type 2 diabetes mellitus with other circulatory complications: Secondary | ICD-10-CM | POA: Diagnosis not present

## 2023-08-19 DIAGNOSIS — K219 Gastro-esophageal reflux disease without esophagitis: Secondary | ICD-10-CM | POA: Diagnosis not present

## 2023-08-19 DIAGNOSIS — E559 Vitamin D deficiency, unspecified: Secondary | ICD-10-CM | POA: Diagnosis not present

## 2023-08-19 DIAGNOSIS — R946 Abnormal results of thyroid function studies: Secondary | ICD-10-CM | POA: Diagnosis not present

## 2023-08-20 ENCOUNTER — Other Ambulatory Visit: Payer: Self-pay | Admitting: Cardiovascular Disease

## 2023-08-20 LAB — LAB REPORT - SCANNED
A1c: 6.9
Albumin, Urine POC: 5.3
Creatinine, POC: 137.8 mg/dL
EGFR: 58
Microalb Creat Ratio: 4

## 2023-08-22 ENCOUNTER — Encounter: Payer: Self-pay | Admitting: Cardiovascular Disease
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
## 2023-08-23 ENCOUNTER — Encounter: Payer: Self-pay | Admitting: Cardiovascular Disease

## 2023-08-23 ENCOUNTER — Ambulatory Visit: Payer: Medicare PPO | Attending: Cardiovascular Disease | Admitting: Cardiovascular Disease

## 2023-08-23 VITALS — BP 134/66 | HR 61 | Ht 61.5 in | Wt 134.0 lb

## 2023-08-23 DIAGNOSIS — I2510 Atherosclerotic heart disease of native coronary artery without angina pectoris: Secondary | ICD-10-CM | POA: Diagnosis not present

## 2023-08-23 DIAGNOSIS — I10 Essential (primary) hypertension: Secondary | ICD-10-CM | POA: Diagnosis not present

## 2023-08-23 DIAGNOSIS — M0589 Other rheumatoid arthritis with rheumatoid factor of multiple sites: Secondary | ICD-10-CM | POA: Diagnosis not present

## 2023-08-23 NOTE — Patient Instructions (Signed)
Medication Instructions:  Your physician recommends that you continue on your current medications as directed. Please refer to the Current Medication list given to you today.  *If you need a refill on your cardiac medications before your next appointment, please call your pharmacy*   Lab Work: none If you have labs (blood work) drawn today and your tests are completely normal, you will receive your results only by: MyChart Message (if you have MyChart) OR A paper copy in the mail If you have any lab test that is abnormal or we need to change your treatment, we will call you to review the results.   Testing/Procedures: none   Follow-Up: At [facility], you and your health needs are our priority.  As part of our continuing mission to provide you with exceptional heart care, we have created designated Provider Care Teams.  These Care Teams include your primary Cardiologist (physician) and Advanced Practice Providers (APPs -  Physician Assistants and Nurse Practitioners) who all work together to provide you with the care you need, when you need it.  We recommend signing up for the patient portal called "MyChart".  Sign up information is provided on this After Visit Summary.  MyChart is used to connect with patients for Virtual Visits (Telemedicine).  Patients are able to view lab/test results, encounter notes, upcoming appointments, etc.  Non-urgent messages can be sent to your provider as well.   To learn more about what you can do with MyChart, go to [email or web address].    Your next appointment:   12 month(s)  Provider:   Dr Mayford Knife    Other Instructions

## 2023-08-26 DIAGNOSIS — Z951 Presence of aortocoronary bypass graft: Secondary | ICD-10-CM | POA: Diagnosis not present

## 2023-08-26 DIAGNOSIS — E785 Hyperlipidemia, unspecified: Secondary | ICD-10-CM | POA: Diagnosis not present

## 2023-08-26 DIAGNOSIS — F419 Anxiety disorder, unspecified: Secondary | ICD-10-CM | POA: Diagnosis not present

## 2023-08-26 DIAGNOSIS — G720 Drug-induced myopathy: Secondary | ICD-10-CM | POA: Diagnosis not present

## 2023-08-26 DIAGNOSIS — I129 Hypertensive chronic kidney disease with stage 1 through stage 4 chronic kidney disease, or unspecified chronic kidney disease: Secondary | ICD-10-CM | POA: Diagnosis not present

## 2023-08-26 DIAGNOSIS — Z23 Encounter for immunization: Secondary | ICD-10-CM | POA: Diagnosis not present

## 2023-08-26 DIAGNOSIS — Z0000 Encounter for general adult medical examination without abnormal findings: Secondary | ICD-10-CM | POA: Diagnosis not present

## 2023-08-26 DIAGNOSIS — M170 Bilateral primary osteoarthritis of knee: Secondary | ICD-10-CM | POA: Diagnosis not present

## 2023-08-26 DIAGNOSIS — E1159 Type 2 diabetes mellitus with other circulatory complications: Secondary | ICD-10-CM | POA: Diagnosis not present

## 2023-08-31 ENCOUNTER — Other Ambulatory Visit: Payer: Self-pay | Admitting: Cardiovascular Disease

## 2023-09-20 DIAGNOSIS — M0589 Other rheumatoid arthritis with rheumatoid factor of multiple sites: Secondary | ICD-10-CM | POA: Diagnosis not present

## 2023-10-19 DIAGNOSIS — M0589 Other rheumatoid arthritis with rheumatoid factor of multiple sites: Secondary | ICD-10-CM | POA: Diagnosis not present

## 2023-11-14 ENCOUNTER — Other Ambulatory Visit: Payer: Self-pay | Admitting: Cardiovascular Disease

## 2023-11-16 DIAGNOSIS — M0589 Other rheumatoid arthritis with rheumatoid factor of multiple sites: Secondary | ICD-10-CM | POA: Diagnosis not present

## 2023-12-09 ENCOUNTER — Other Ambulatory Visit: Payer: Self-pay | Admitting: Cardiovascular Disease

## 2023-12-14 DIAGNOSIS — M0589 Other rheumatoid arthritis with rheumatoid factor of multiple sites: Secondary | ICD-10-CM | POA: Diagnosis not present

## 2023-12-17 ENCOUNTER — Telehealth: Payer: Self-pay | Admitting: Cardiovascular Disease

## 2023-12-17 NOTE — Telephone Encounter (Signed)
*  STAT* If patient is at the pharmacy, call can be transferred to refill team.   1. Which medications need to be refilled? (please list name of each medication and dose if known) metoprolol tartrate (LOPRESSOR) 25 MG tablet  2. Which pharmacy/location (including street and city if local pharmacy) is medication to be sent to? [id number] - [place], [place] - [address] AT [facility] OF [address]  3. Do they need a 30 day or 90 day supply? 90

## 2023-12-20 ENCOUNTER — Telehealth: Payer: Self-pay | Admitting: Cardiovascular Disease

## 2023-12-20 ENCOUNTER — Other Ambulatory Visit: Payer: Self-pay | Admitting: Nurse Practitioner

## 2023-12-20 MED ORDER — METOPROLOL TARTRATE 25 MG PO TABS: 25.0000 mg | ORAL_TABLET | Freq: Two times a day (BID) | ORAL | 2 refills | Status: DC

## 2023-12-20 MED ORDER — ISOSORBIDE MONONITRATE ER 60 MG PO TB24: 60.0000 mg | ORAL_TABLET | Freq: Every day | ORAL | 3 refills | Status: AC

## 2023-12-20 NOTE — Telephone Encounter (Signed)
Pt c/o medication issue:  1. Name of Medication: isosorbide mononitrate (IMDUR) 30 MG 24 hr tablet   2. How are you currently taking this medication (dosage and times per day)? Patient has been taking 2 tablets daily  3. Are you having a reaction (difficulty breathing--STAT)? no  4. What is your medication issue? Patient has been taking 2 tablets daily but instructions state 1 tablet daily and her insurance will not pay due to getting refill sooner than she should.

## 2023-12-20 NOTE — Telephone Encounter (Signed)
   Pt called in after hours related to her imdur.  She has been taking 30 mg x 2 tabs daily for several years.  Most recent refill was for 1 tab daily, however, she continued to take 2 tabs and was not notified of any change in dose.  She is now running out of her imdur too soon.  I reviewed her chart.  I sent a Rx for imdur 60 mg daily, 1 tab, PO daily, # 90, three refills into her pharmacy.  Caller verbalized understanding and was grateful for the call back.  Nicolasa Ducking, NP 12/20/2023, 6:21 PM

## 2024-01-18 DIAGNOSIS — E785 Hyperlipidemia, unspecified: Secondary | ICD-10-CM | POA: Diagnosis not present

## 2024-01-18 DIAGNOSIS — M810 Age-related osteoporosis without current pathological fracture: Secondary | ICD-10-CM | POA: Diagnosis not present

## 2024-01-18 DIAGNOSIS — M170 Bilateral primary osteoarthritis of knee: Secondary | ICD-10-CM | POA: Diagnosis not present

## 2024-01-18 DIAGNOSIS — M25569 Pain in unspecified knee: Secondary | ICD-10-CM | POA: Diagnosis not present

## 2024-01-18 DIAGNOSIS — M0589 Other rheumatoid arthritis with rheumatoid factor of multiple sites: Secondary | ICD-10-CM | POA: Diagnosis not present

## 2024-01-18 DIAGNOSIS — Z79899 Other long term (current) drug therapy: Secondary | ICD-10-CM | POA: Diagnosis not present

## 2024-01-18 DIAGNOSIS — I2510 Atherosclerotic heart disease of native coronary artery without angina pectoris: Secondary | ICD-10-CM | POA: Diagnosis not present

## 2024-01-18 DIAGNOSIS — I10 Essential (primary) hypertension: Secondary | ICD-10-CM | POA: Diagnosis not present

## 2024-02-15 DIAGNOSIS — M0589 Other rheumatoid arthritis with rheumatoid factor of multiple sites: Secondary | ICD-10-CM | POA: Diagnosis not present

## 2024-02-17 DIAGNOSIS — Z79899 Other long term (current) drug therapy: Secondary | ICD-10-CM | POA: Diagnosis not present

## 2024-02-17 DIAGNOSIS — E1122 Type 2 diabetes mellitus with diabetic chronic kidney disease: Secondary | ICD-10-CM | POA: Diagnosis not present

## 2024-02-24 DIAGNOSIS — R946 Abnormal results of thyroid function studies: Secondary | ICD-10-CM | POA: Diagnosis not present

## 2024-02-24 DIAGNOSIS — Z951 Presence of aortocoronary bypass graft: Secondary | ICD-10-CM | POA: Diagnosis not present

## 2024-02-24 DIAGNOSIS — E1159 Type 2 diabetes mellitus with other circulatory complications: Secondary | ICD-10-CM | POA: Diagnosis not present

## 2024-02-24 DIAGNOSIS — E785 Hyperlipidemia, unspecified: Secondary | ICD-10-CM | POA: Diagnosis not present

## 2024-02-24 DIAGNOSIS — I129 Hypertensive chronic kidney disease with stage 1 through stage 4 chronic kidney disease, or unspecified chronic kidney disease: Secondary | ICD-10-CM | POA: Diagnosis not present

## 2024-02-24 DIAGNOSIS — M170 Bilateral primary osteoarthritis of knee: Secondary | ICD-10-CM | POA: Diagnosis not present

## 2024-02-24 DIAGNOSIS — F419 Anxiety disorder, unspecified: Secondary | ICD-10-CM | POA: Diagnosis not present

## 2024-02-24 DIAGNOSIS — N1831 Chronic kidney disease, stage 3a: Secondary | ICD-10-CM | POA: Diagnosis not present

## 2024-02-24 DIAGNOSIS — G720 Drug-induced myopathy: Secondary | ICD-10-CM | POA: Diagnosis not present

## 2024-03-14 DIAGNOSIS — M0589 Other rheumatoid arthritis with rheumatoid factor of multiple sites: Secondary | ICD-10-CM | POA: Diagnosis not present

## 2024-03-27 ENCOUNTER — Inpatient Hospital Stay (HOSPITAL_COMMUNITY)
Admission: EM | Admit: 2024-03-27 | Discharge: 2024-04-06 | DRG: 483 | Disposition: A | Discharge status: Skilled Nursing Facility | Attending: Internal Medicine | Admitting: Internal Medicine

## 2024-03-27 ENCOUNTER — Other Ambulatory Visit: Payer: Self-pay

## 2024-03-27 ENCOUNTER — Emergency Department (HOSPITAL_COMMUNITY)

## 2024-03-27 ENCOUNTER — Encounter (HOSPITAL_COMMUNITY): Payer: Self-pay | Admitting: Emergency Medicine

## 2024-03-27 DIAGNOSIS — I10 Essential (primary) hypertension: Secondary | ICD-10-CM | POA: Diagnosis present

## 2024-03-27 DIAGNOSIS — Z87891 Personal history of nicotine dependence: Secondary | ICD-10-CM

## 2024-03-27 DIAGNOSIS — I25110 Atherosclerotic heart disease of native coronary artery with unstable angina pectoris: Secondary | ICD-10-CM | POA: Diagnosis not present

## 2024-03-27 DIAGNOSIS — I2510 Atherosclerotic heart disease of native coronary artery without angina pectoris: Secondary | ICD-10-CM | POA: Diagnosis present

## 2024-03-27 DIAGNOSIS — R609 Edema, unspecified: Secondary | ICD-10-CM | POA: Diagnosis not present

## 2024-03-27 DIAGNOSIS — Z7902 Long term (current) use of antithrombotics/antiplatelets: Secondary | ICD-10-CM

## 2024-03-27 DIAGNOSIS — E785 Hyperlipidemia, unspecified: Secondary | ICD-10-CM | POA: Diagnosis present

## 2024-03-27 DIAGNOSIS — M25452 Effusion, left hip: Secondary | ICD-10-CM | POA: Diagnosis not present

## 2024-03-27 DIAGNOSIS — D84821 Immunodeficiency due to drugs: Secondary | ICD-10-CM | POA: Diagnosis not present

## 2024-03-27 DIAGNOSIS — M25552 Pain in left hip: Secondary | ICD-10-CM | POA: Diagnosis not present

## 2024-03-27 DIAGNOSIS — M25512 Pain in left shoulder: Secondary | ICD-10-CM | POA: Diagnosis present

## 2024-03-27 DIAGNOSIS — Z951 Presence of aortocoronary bypass graft: Secondary | ICD-10-CM

## 2024-03-27 DIAGNOSIS — W010XXA Fall on same level from slipping, tripping and stumbling without subsequent striking against object, initial encounter: Secondary | ICD-10-CM | POA: Diagnosis present

## 2024-03-27 DIAGNOSIS — M1990 Unspecified osteoarthritis, unspecified site: Secondary | ICD-10-CM | POA: Diagnosis not present

## 2024-03-27 DIAGNOSIS — W19XXXA Unspecified fall, initial encounter: Secondary | ICD-10-CM | POA: Diagnosis not present

## 2024-03-27 DIAGNOSIS — I779 Disorder of arteries and arterioles, unspecified: Secondary | ICD-10-CM | POA: Diagnosis present

## 2024-03-27 DIAGNOSIS — S3282XA Multiple fractures of pelvis without disruption of pelvic ring, initial encounter for closed fracture: Secondary | ICD-10-CM | POA: Diagnosis present

## 2024-03-27 DIAGNOSIS — S42202D Unspecified fracture of upper end of left humerus, subsequent encounter for fracture with routine healing: Secondary | ICD-10-CM

## 2024-03-27 DIAGNOSIS — Z96612 Presence of left artificial shoulder joint: Secondary | ICD-10-CM | POA: Diagnosis not present

## 2024-03-27 DIAGNOSIS — R001 Bradycardia, unspecified: Secondary | ICD-10-CM | POA: Diagnosis not present

## 2024-03-27 DIAGNOSIS — Z7401 Bed confinement status: Secondary | ICD-10-CM | POA: Diagnosis not present

## 2024-03-27 DIAGNOSIS — K5900 Constipation, unspecified: Secondary | ICD-10-CM | POA: Diagnosis not present

## 2024-03-27 DIAGNOSIS — S42202A Unspecified fracture of upper end of left humerus, initial encounter for closed fracture: Secondary | ICD-10-CM | POA: Diagnosis not present

## 2024-03-27 DIAGNOSIS — S42292A Other displaced fracture of upper end of left humerus, initial encounter for closed fracture: Secondary | ICD-10-CM | POA: Diagnosis not present

## 2024-03-27 DIAGNOSIS — B029 Zoster without complications: Secondary | ICD-10-CM | POA: Diagnosis present

## 2024-03-27 DIAGNOSIS — S32592A Other specified fracture of left pubis, initial encounter for closed fracture: Secondary | ICD-10-CM | POA: Diagnosis not present

## 2024-03-27 DIAGNOSIS — Z79631 Long term (current) use of antimetabolite agent: Secondary | ICD-10-CM | POA: Diagnosis not present

## 2024-03-27 DIAGNOSIS — Z955 Presence of coronary angioplasty implant and graft: Secondary | ICD-10-CM

## 2024-03-27 DIAGNOSIS — Z794 Long term (current) use of insulin: Secondary | ICD-10-CM

## 2024-03-27 DIAGNOSIS — Z885 Allergy status to narcotic agent status: Secondary | ICD-10-CM | POA: Diagnosis not present

## 2024-03-27 DIAGNOSIS — M069 Rheumatoid arthritis, unspecified: Secondary | ICD-10-CM | POA: Diagnosis present

## 2024-03-27 DIAGNOSIS — D649 Anemia, unspecified: Secondary | ICD-10-CM | POA: Diagnosis present

## 2024-03-27 DIAGNOSIS — I700 Atherosclerosis of aorta: Secondary | ICD-10-CM | POA: Diagnosis not present

## 2024-03-27 DIAGNOSIS — S42412D Displaced simple supracondylar fracture without intercondylar fracture of left humerus, subsequent encounter for fracture with routine healing: Secondary | ICD-10-CM | POA: Diagnosis not present

## 2024-03-27 DIAGNOSIS — I252 Old myocardial infarction: Secondary | ICD-10-CM | POA: Diagnosis not present

## 2024-03-27 DIAGNOSIS — Z79899 Other long term (current) drug therapy: Secondary | ICD-10-CM | POA: Diagnosis not present

## 2024-03-27 DIAGNOSIS — R079 Chest pain, unspecified: Secondary | ICD-10-CM | POA: Diagnosis not present

## 2024-03-27 DIAGNOSIS — S42412A Displaced simple supracondylar fracture without intercondylar fracture of left humerus, initial encounter for closed fracture: Secondary | ICD-10-CM | POA: Diagnosis not present

## 2024-03-27 DIAGNOSIS — Z881 Allergy status to other antibiotic agents status: Secondary | ICD-10-CM

## 2024-03-27 DIAGNOSIS — E119 Type 2 diabetes mellitus without complications: Secondary | ICD-10-CM | POA: Diagnosis present

## 2024-03-27 DIAGNOSIS — M25462 Effusion, left knee: Secondary | ICD-10-CM | POA: Diagnosis not present

## 2024-03-27 DIAGNOSIS — Z7982 Long term (current) use of aspirin: Secondary | ICD-10-CM | POA: Diagnosis not present

## 2024-03-27 DIAGNOSIS — Z602 Problems related to living alone: Secondary | ICD-10-CM | POA: Diagnosis present

## 2024-03-27 DIAGNOSIS — M47812 Spondylosis without myelopathy or radiculopathy, cervical region: Secondary | ICD-10-CM | POA: Diagnosis not present

## 2024-03-27 DIAGNOSIS — I959 Hypotension, unspecified: Secondary | ICD-10-CM | POA: Diagnosis not present

## 2024-03-27 DIAGNOSIS — R519 Headache, unspecified: Secondary | ICD-10-CM | POA: Diagnosis not present

## 2024-03-27 DIAGNOSIS — Z90710 Acquired absence of both cervix and uterus: Secondary | ICD-10-CM

## 2024-03-27 DIAGNOSIS — G8918 Other acute postprocedural pain: Secondary | ICD-10-CM | POA: Diagnosis not present

## 2024-03-27 DIAGNOSIS — M7989 Other specified soft tissue disorders: Secondary | ICD-10-CM | POA: Diagnosis not present

## 2024-03-27 DIAGNOSIS — Z8249 Family history of ischemic heart disease and other diseases of the circulatory system: Secondary | ICD-10-CM

## 2024-03-27 DIAGNOSIS — M160 Bilateral primary osteoarthritis of hip: Secondary | ICD-10-CM | POA: Diagnosis not present

## 2024-03-27 DIAGNOSIS — S42302A Unspecified fracture of shaft of humerus, left arm, initial encounter for closed fracture: Secondary | ICD-10-CM | POA: Diagnosis not present

## 2024-03-27 DIAGNOSIS — S42352A Displaced comminuted fracture of shaft of humerus, left arm, initial encounter for closed fracture: Secondary | ICD-10-CM | POA: Diagnosis not present

## 2024-03-27 DIAGNOSIS — M19012 Primary osteoarthritis, left shoulder: Secondary | ICD-10-CM | POA: Diagnosis not present

## 2024-03-27 DIAGNOSIS — M948X5 Other specified disorders of cartilage, thigh: Secondary | ICD-10-CM | POA: Diagnosis not present

## 2024-03-27 LAB — BASIC METABOLIC PANEL WITH GFR
Anion gap: 9 (ref 5–15)
BUN: 13 mg/dL (ref 8–23)
CO2: 21 mmol/L — ABNORMAL LOW (ref 22–32)
Calcium: 9 mg/dL (ref 8.9–10.3)
Chloride: 105 mmol/L (ref 98–111)
Creatinine, Ser: 0.79 mg/dL (ref 0.44–1.00)
GFR, Estimated: >60 mL/min (ref >60)
Glucose, Bld: 191 mg/dL — ABNORMAL HIGH (ref 70–99)
Potassium: 4.1 mmol/L (ref 3.5–5.1)
Sodium: 135 mmol/L (ref 135–145)

## 2024-03-27 LAB — CBC
HCT: 33 % — ABNORMAL LOW (ref 36.0–46.0)
Hemoglobin: 10.8 g/dL — ABNORMAL LOW (ref 12.0–15.0)
MCH: 31.1 pg (ref 26.0–34.0)
MCHC: 32.7 g/dL (ref 30.0–36.0)
MCV: 95.1 fL (ref 80.0–100.0)
Platelets: 169 10*3/uL (ref 150–400)
RBC: 3.47 MIL/uL — ABNORMAL LOW (ref 3.87–5.11)
RDW: 14.4 % (ref 11.5–15.5)
WBC: 7.5 10*3/uL (ref 4.0–10.5)
nRBC: 0 % (ref 0.0–0.2)

## 2024-03-27 LAB — GLUCOSE, CAPILLARY: Glucose-Capillary: 165 mg/dL — ABNORMAL HIGH (ref 70–99)

## 2024-03-27 LAB — PROTIME-INR
INR: 1.2 (ref 0.8–1.2)
Prothrombin Time: 15 s (ref 11.4–15.2)

## 2024-03-27 MED ORDER — FENTANYL CITRATE PF 50 MCG/ML IJ SOSY
50.0000 ug | PREFILLED_SYRINGE | INTRAMUSCULAR | Status: DC | PRN
  Administered 2024-03-27 – 2024-03-28 (×2): 50 ug via INTRAVENOUS
  Filled 2024-03-27 (×2): qty 1

## 2024-03-27 MED ORDER — INSULIN ASPART 100 UNIT/ML IJ SOLN: 0.0000 [IU] | Freq: Every day | INTRAMUSCULAR | Status: DC

## 2024-03-27 MED ORDER — ACETAMINOPHEN 325 MG PO TABS: 650.0000 mg | ORAL_TABLET | Freq: Four times a day (QID) | ORAL | Status: DC | PRN

## 2024-03-27 MED ORDER — VALACYCLOVIR HCL 500 MG PO TABS
1000.0000 mg | ORAL_TABLET | Freq: Once | ORAL | Status: AC
  Administered 2024-03-27: 1000 mg via ORAL

## 2024-03-27 MED ORDER — INSULIN ASPART 100 UNIT/ML IJ SOLN
3.0000 [IU] | Freq: Three times a day (TID) | INTRAMUSCULAR | Status: DC
  Administered 2024-03-29 – 2024-03-31 (×5): 3 [IU] via SUBCUTANEOUS

## 2024-03-27 MED ORDER — OXYCODONE HCL 5 MG PO TABS
5.0000 mg | ORAL_TABLET | ORAL | Status: DC | PRN
  Administered 2024-03-27 – 2024-03-29 (×5): 5 mg via ORAL
  Filled 2024-03-27 (×5): qty 1

## 2024-03-27 MED ORDER — DOCUSATE SODIUM 100 MG PO CAPS
100.0000 mg | ORAL_CAPSULE | Freq: Two times a day (BID) | ORAL | Status: DC
  Administered 2024-03-28 – 2024-03-31 (×7): 100 mg via ORAL
  Filled 2024-03-27 (×9): qty 1

## 2024-03-27 MED ORDER — ONDANSETRON HCL 4 MG/2ML IJ SOLN
4.0000 mg | Freq: Once | INTRAMUSCULAR | Status: AC
  Administered 2024-03-27: 4 mg via INTRAVENOUS
  Filled 2024-03-27: qty 2

## 2024-03-27 MED ORDER — ACETAMINOPHEN 650 MG RE SUPP: 650.0000 mg | Freq: Four times a day (QID) | RECTAL | Status: DC | PRN

## 2024-03-27 MED ORDER — FENTANYL CITRATE PF 50 MCG/ML IJ SOSY
50.0000 ug | PREFILLED_SYRINGE | Freq: Once | INTRAMUSCULAR | Status: AC
  Administered 2024-03-27: 50 ug via INTRAVENOUS
  Filled 2024-03-27: qty 1

## 2024-03-27 NOTE — ED Triage Notes (Addendum)
 BIB EMS from store, pt slipped and landed on left shoulder. Denies any other pain. No loc and did not hit head. No deformities  140/70 55 pulse 97% Ra  fentanyl 

## 2024-03-27 NOTE — H&P (Signed)
 History and Physical    Patient: [name] [name] YQM:[id number] DOB: 05-05-1941 DOA: 03/27/2024 DOS: the patient was seen and examined on 03/27/2024 PCP: Pete Brand, DO  Patient coming from: {Point_of_Origin:[address]}  Chief Complaint:  Chief Complaint  Patient presents with   Fall   HPI: [name] [name] is a 83 y.o. female with medical history significant of RA on methotrexate   Review of Systems:  ROS    Past Medical History:  Diagnosis Date   CAD (coronary artery disease)    s/p MI in 1997 tx with POBA to LCx // s/p CABG 11/2021   Carotid artery disease (HCC)    US  1/23: L 1-39   Diabetes mellitus    Drug therapy    Intermittent steroid use   Dyslipidemia    Edema    Ejection fraction    EF 60%, echo, November, 2011, trivial pericardial effusion // Echo 1/23: EF 60-65   HTN (hypertension) 12/29/2021   Rheumatoid arthritis(714.0)    Hospitalization August, 2011, severe RA flare,    Statin intolerance    Past Surgical History:  Procedure Laterality Date   ABDOMINAL HYSTERECTOMY     CHOLECYSTECTOMY     CORONARY ARTERY BYPASS GRAFT N/A 12/08/2021   Procedure: CORONARY ARTERY BYPASS GRAFTING (CABG) X 3, ON PUMP< USING LEFT INTERNAL MAMMARY ARTERY AND RIGHT ENDOSCOPIC GREATER SAPHENOUS VEIN CONDUITS;  Surgeon: Hilarie Lovely, MD;  Location: [facility] OR;  Service: Open Heart Surgery;  Laterality: N/A;   CORONARY STENT INTERVENTION N/A 05/22/2022   Procedure: CORONARY STENT INTERVENTION;  Surgeon: Lucendia Rusk, MD;  Location: [facility] INVASIVE CV LAB;  Service: Cardiovascular;  Laterality: N/A;   ENDOVEIN HARVEST OF GREATER SAPHENOUS VEIN Right 12/08/2021   Procedure: ENDOVEIN HARVEST OF GREATER SAPHENOUS VEIN;  Surgeon: Hilarie Lovely, MD;  Location: [facility] OR;  Service: Open Heart Surgery;  Laterality: Right;   LEFT HEART CATH AND CORONARY ANGIOGRAPHY N/A 01/10/2019   Procedure: LEFT HEART CATH AND CORONARY ANGIOGRAPHY;  Surgeon: Swaziland, Peter M, MD;  Location: [facility] INVASIVE CV  LAB;  Service: Cardiovascular;  Laterality: N/A;   LEFT HEART CATH AND CORONARY ANGIOGRAPHY N/A 12/02/2021   Procedure: LEFT HEART CATH AND CORONARY ANGIOGRAPHY;  Surgeon: Arty Binning, MD;  Location: [facility] INVASIVE CV LAB;  Service: Cardiovascular;  Laterality: N/A;   LEFT HEART CATH AND CORS/GRAFTS ANGIOGRAPHY N/A 05/20/2022   Procedure: LEFT HEART CATH AND CORS/GRAFTS ANGIOGRAPHY;  Surgeon: Arleen Lacer, MD;  Location: [facility] INVASIVE CV LAB;  Service: Cardiovascular;  Laterality: N/A;   LEFT HEART CATH AND CORS/GRAFTS ANGIOGRAPHY N/A 05/21/2022   Procedure: LEFT HEART CATH AND CORS/GRAFTS ANGIOGRAPHY;  Surgeon: Odie Benne, MD;  Location: [facility] INVASIVE CV LAB;  Service: Cardiovascular;  Laterality: N/A;   TEE WITHOUT CARDIOVERSION N/A 12/08/2021   Procedure: TRANSESOPHAGEAL ECHOCARDIOGRAM (TEE);  Surgeon: Hilarie Lovely, MD;  Location: [facility] OR;  Service: Open Heart Surgery;  Laterality: N/A;   Social History:  reports that she quit smoking about 29 years ago. Her smoking use included cigarettes. She has never used smokeless tobacco. She reports that she does not drink alcohol  and does not use drugs.  Allergies  Allergen Reactions   Hydrocodone Nausea Only   Statins Other (See Comments)    Myalgia   Amaryl [Glimepiride] Other (See Comments)    Unknown reaction   Avandia [Rosiglitazone] Other (See Comments)    Arthralgia   Crestor  [Rosuvastatin ] Other (See Comments)    Myalgia    Ultram  [Tramadol ] Other (See Comments)  Hallucinations    Welchol [Colesevelam] Other (See Comments)    Sore throat   Zestril [Lisinopril] Cough   Zetia  [Ezetimibe ] Other (See Comments)    Myalgia    Zorprin [Aspirin ]    Januvia [Sitagliptin] Rash   Metformin And Related Other (See Comments)    Arthralgia   Vibra-Tab [Doxycycline] Swelling and Rash    Eye swelling   Wound Dressing Adhesive Rash    Medical tape adhesive    Family History  Problem Relation Age of Onset   Hypertension  Mother    Diabetes Mother    Other Father    Breast cancer Neg Hx     Prior to Admission medications   Medication Sig Start Date End Date Taking? Authorizing Provider  Abatacept  125 MG/ML SOSY Inject 1 Dose into the skin every 30 (thirty) days.   Yes [provider]  acetaminophen  (TYLENOL ) 325 MG tablet Take 2 tablets (650 mg total) by mouth every 6 (six) hours as needed for moderate pain, fever or headache. 12/16/21  Yes Barrett, Erin R, PA-C  ALPRAZolam (XANAX) 0.25 MG tablet Take 0.25 mg by mouth daily as needed. 07/17/23  Yes [provider]  aspirin  81 MG tablet Take 1 tablet (81 mg total) by mouth daily. Patient taking differently: Take 81 mg by mouth in the morning. 07/26/13  Yes Deena Farrier, MD  Cholecalciferol (VITAMIN D-3) 1000 units CAPS Take 1,000 Units by mouth in the morning.   Yes [provider]  clopidogrel  (PLAVIX ) 75 MG tablet TAKE 1 TABLET(75 MG) BY MOUTH DAILY WITH BREAKFAST Patient taking differently: Take 75 mg by mouth daily with breakfast. 11/15/23  Yes Nahser, Lela Purple, MD  folic acid  (FOLVITE ) 1 MG tablet Take 1 mg by mouth every evening.    Yes [provider]  furosemide  (LASIX ) 40 MG tablet Take 1 tablet (40 mg total) by mouth as needed for fluid (wt gain of 3 lbs in 24 hours.). 12/30/21  Yes Weaver, Scott T, PA-C  Insulin  Lispro Prot & Lispro (HUMALOG 75/25 MIX) (75-25) 100 UNIT/ML Kwikpen Inject 3-5 Units into the skin in the morning. 06/28/21  Yes [provider]  isosorbide  mononitrate (IMDUR ) 60 MG 24 hr tablet Take 1 tablet (60 mg total) by mouth daily. 12/20/23  Yes Florette Hurry, NP  losartan  (COZAAR ) 25 MG tablet TAKE 1 TABLET(25 MG) BY MOUTH DAILY Patient taking differently: Take 25 mg by mouth in the morning. 08/31/23  Yes Nahser, Lela Purple, MD  methotrexate  250 MG/10ML injection Inject 10 mg into the muscle once a week. On Tuesdays 06/29/19  Yes [provider]  metoprolol  tartrate  (LOPRESSOR ) 25 MG tablet Take 1 tablet (25 mg total) by mouth 2 (two) times daily. Patient taking differently: Take 12.5 mg by mouth 2 (two) times daily. 12/20/23  Yes Nahser, Lela Purple, MD  Multiple Vitamin (MULTIVITAMIN WITH MINERALS) TABS tablet Take 1 tablet by mouth daily. Centrum silver   Yes [provider]  nitroGLYCERIN  (NITROSTAT ) 0.4 MG SL tablet Place 1 tablet (0.4 mg total) under the tongue every 5 (five) minutes as needed. 05/23/22  Yes Bhagat, Bhavinkumar, PA  pantoprazole  (PROTONIX ) 40 MG tablet Take 40 mg by mouth daily.   Yes [provider]  potassium chloride  (KLOR-CON ) 10 MEQ tablet Take 1 tablet (10 mEq total) by mouth as needed (wt gain of 3 lbs in 24 hours to be taken with Lasix ). 12/30/21  Yes Weaver, Scott T, PA-C  valACYclovir (VALTREX) 1000 MG tablet Take  1,000 mg by mouth 3 (three) times daily. For 7 days 03/27/24  Yes [provider]  B-D UF III MINI PEN NEEDLES 31G X 5 MM MISC USE WITH INSULIN  TO INJECT BID UTD 02/14/19   [provider]  [facility] VERIO test strip USE 1 STRIP TID UTD 01/07/19   [provider]    Physical Exam: Vitals:   03/27/24 1506 03/27/24 1511 03/27/24 1552 03/27/24 1824  BP:  (!) 142/68  (!) 152/71  Pulse:  (!) 52  (!) 59  Resp:  18  20  Temp:  97.8 F (36.6 C)  97.7 F (36.5 C)  TempSrc:  Oral  Oral  SpO2:  96%  100%  Weight: 60.8 kg  60.8 kg   Height:   5' 1.5" (1.562 m)    *** Data Reviewed: {Tip this will not be part of the note when signed- Document your independent interpretation of telemetry tracing, EKG, lab, Radiology test or any other diagnostic tests. Add any new diagnostic test ordered today. (Optional):26781} {Results:26384}  Assessment and Plan: No notes have been filed under this hospital service. Service: Hospitalist  L Humerous fracture, closed, initial encounter  - NPO midnight pending Orthopedic Surgery evaluation in am  - pain control, daily colace  - bedrest for now,  shoulder immobilizer, PT following surgery  - pre-surgical admission labs are pending   RA - on methotrexate  at home. Currently under control   Zoster  - continue acyclovir   IDDM - SSI for now   SCDs  IVF  NPO midnight  Monitor/replace electrolytes    Advance Care Planning:   Code Status: Full Code   Consults: Orthopedic Surgery    Severity of Illness: {Observation/Inpatient:21159}  Author: Charlesetta Connors, DO 03/27/2024 9:24 PM  For on call review [email or web address].

## 2024-03-27 NOTE — ED Notes (Addendum)
 Doctor notified about the difference in order of the medication and the physical medication brought by the patient. Patient was given the prescribed dose of the medication that was ordered while waiting for the order to be adjusted. (This medication was also given crushed per patient's request).

## 2024-03-27 NOTE — ED Provider Notes (Signed)
 [place] EMERGENCY DEPARTMENT AT [facility] Provider Note  History  Chief Complaint:  Fall   Trauma Mechanism of injury: Fall Injury location: shoulder/arm Injury location detail: L shoulder Incident location: at store. Arrived directly from scene: yes   Fall:      Fall occurred: standing      Impact surface: hard floor  EMS/PTA data:      Loss of consciousness: no      Immobilization: none  Current symptoms:      Associated symptoms:            Denies abdominal pain, loss of consciousness, nausea, seizures and vomiting.     PERSIA FIFIELD is a 83 y.o. female with a history of CAD, DM who presents as a mechanical fall.   Past Medical History:  Diagnosis Date   CAD (coronary artery disease)    s/p MI in 1997 tx with POBA to LCx // s/p CABG 11/2021   Carotid artery disease (HCC)    US  1/23: L 1-39   Diabetes mellitus    Drug therapy    Intermittent steroid use   Dyslipidemia    Edema    Ejection fraction    EF 60%, echo, November, 2011, trivial pericardial effusion // Echo 1/23: EF 60-65   HTN (hypertension) 12/29/2021   Rheumatoid arthritis(714.0)    Hospitalization August, 2011, severe RA flare,    Statin intolerance     Past Surgical History:  Procedure Laterality Date   ABDOMINAL HYSTERECTOMY     CHOLECYSTECTOMY     CORONARY ARTERY BYPASS GRAFT N/A 12/08/2021   Procedure: CORONARY ARTERY BYPASS GRAFTING (CABG) X 3, ON PUMP< USING LEFT INTERNAL MAMMARY ARTERY AND RIGHT ENDOSCOPIC GREATER SAPHENOUS VEIN CONDUITS;  Surgeon: Hilarie Lovely, MD;  Location: [facility] OR;  Service: Open Heart Surgery;  Laterality: N/A;   CORONARY STENT INTERVENTION N/A 05/22/2022   Procedure: CORONARY STENT INTERVENTION;  Surgeon: Lucendia Rusk, MD;  Location: [facility] INVASIVE CV LAB;  Service: Cardiovascular;  Laterality: N/A;   ENDOVEIN HARVEST OF GREATER SAPHENOUS VEIN Right 12/08/2021   Procedure: ENDOVEIN HARVEST OF GREATER SAPHENOUS VEIN;  Surgeon: Hilarie Lovely, MD;  Location: [facility] OR;  Service: Open Heart Surgery;  Laterality: Right;   LEFT HEART CATH AND CORONARY ANGIOGRAPHY N/A 01/10/2019   Procedure: LEFT HEART CATH AND CORONARY ANGIOGRAPHY;  Surgeon: Swaziland, Peter M, MD;  Location: [facility] INVASIVE CV LAB;  Service: Cardiovascular;  Laterality: N/A;   LEFT HEART CATH AND CORONARY ANGIOGRAPHY N/A 12/02/2021   Procedure: LEFT HEART CATH AND CORONARY ANGIOGRAPHY;  Surgeon: Arty Binning, MD;  Location: [facility] INVASIVE CV LAB;  Service: Cardiovascular;  Laterality: N/A;   LEFT HEART CATH AND CORS/GRAFTS ANGIOGRAPHY N/A 05/20/2022   Procedure: LEFT HEART CATH AND CORS/GRAFTS ANGIOGRAPHY;  Surgeon: Arleen Lacer, MD;  Location: [facility] INVASIVE CV LAB;  Service: Cardiovascular;  Laterality: N/A;   LEFT HEART CATH AND CORS/GRAFTS ANGIOGRAPHY N/A 05/21/2022   Procedure: LEFT HEART CATH AND CORS/GRAFTS ANGIOGRAPHY;  Surgeon: Odie Benne, MD;  Location: [facility] INVASIVE CV LAB;  Service: Cardiovascular;  Laterality: N/A;   TEE WITHOUT CARDIOVERSION N/A 12/08/2021   Procedure: TRANSESOPHAGEAL ECHOCARDIOGRAM (TEE);  Surgeon: Hilarie Lovely, MD;  Location: [facility] OR;  Service: Open Heart Surgery;  Laterality: N/A;    Family History  Problem Relation Age of Onset   Hypertension Mother    Diabetes Mother    Other Father    Breast cancer Neg Hx  Social History   Tobacco Use   Smoking status: Former    Current packs/day: 0.00    Types: Cigarettes    Quit date: 11/16/1994    Years since quitting: 29.3   Smokeless tobacco: Never  Vaping Use   Vaping status: Never Used  Substance Use Topics   Alcohol  use: No    Alcohol /week: 0.0 standard drinks of alcohol    Drug use: No    Review of Systems  Review of Systems  Gastrointestinal:  Negative for abdominal pain, nausea and vomiting.  Neurological:  Negative for seizures and loss of consciousness.     Reviewed and documented in HPI if pertinent.   Physical Exam   ED Triage Vitals  Encounter  Vitals Group     BP 03/27/24 1511 (!) 142/68     Systolic BP Percentile --      Diastolic BP Percentile --      Pulse Rate 03/27/24 1511 (!) 52     Resp 03/27/24 1511 18     Temp 03/27/24 1511 97.8 F (36.6 C)     Temp Source 03/27/24 1511 Oral     SpO2 03/27/24 1511 96 %     Weight 03/27/24 1506 134 lb 0.6 oz (60.8 kg)     Height 03/27/24 1552 5' 1.5" (1.562 m)     Head Circumference --      Peak Flow --      Pain Score 03/27/24 1505 10     Pain Loc --      Pain Education --      Exclude from Growth Chart --      Physical Exam Vitals and nursing note reviewed.  Constitutional:      General: She is not in acute distress.    Appearance: She is well-developed.  HENT:     Head: Normocephalic and atraumatic.  Eyes:     Conjunctiva/sclera: Conjunctivae normal.  Cardiovascular:     Rate and Rhythm: Normal rate and regular rhythm.     Heart sounds: No murmur heard. Pulmonary:     Effort: Pulmonary effort is normal. No respiratory distress.     Breath sounds: Normal breath sounds.  Abdominal:     Palpations: Abdomen is soft.     Tenderness: There is no abdominal tenderness.  Musculoskeletal:        General: No swelling.     Cervical back: Neck supple. No bony tenderness.     Thoracic back: No bony tenderness.     Lumbar back: No bony tenderness.     Comments: L shoulder pain on palpation; 2+ radial pulse, intact sensation, grip strength intact  Skin:    General: Skin is warm and dry.     Capillary Refill: Capillary refill takes less than 2 seconds.  Neurological:     Mental Status: She is alert.  Psychiatric:        Mood and Affect: Mood normal.      Procedures   Procedures  ED Course - Medical Decision Making  Brief Overview OLIVIYAH EASTON is a 83 y.o. female who presents as per above.  I have reviewed the nursing documentation for past medical history, family history, and social history and agree.  I have reviewed the patient's vital signs.  Hypertension  Initial Differential Diagnoses: I am primarily concerned for intracranial bleed, life-threatening extremity injury, fracture, dislocation.  Therapies: These medications and interventions were provided for the patient while in the ED.  Medications  fentaNYL  (SUBLIMAZE ) injection 50 mcg (50  mcg Intravenous Given 03/27/24 1934)  insulin  aspart (novoLOG ) injection 0-5 Units ( Subcutaneous Not Given 03/27/24 2206)  insulin  aspart (novoLOG ) injection 3 Units (has no administration in time range)  acetaminophen  (TYLENOL ) tablet 650 mg (has no administration in time range)    Or  acetaminophen  (TYLENOL ) suppository 650 mg (has no administration in time range)  oxyCODONE  (Oxy IR/ROXICODONE ) immediate release tablet 5 mg (5 mg Oral Given 03/27/24 2203)  docusate sodium  (COLACE) capsule 100 mg (100 mg Oral Patient Refused/Not Given 03/27/24 2203)  fentaNYL  (SUBLIMAZE ) injection 50 mcg (50 mcg Intravenous Given 03/27/24 1658)  ondansetron  (ZOFRAN ) injection 4 mg (4 mg Intravenous Given 03/27/24 1659)  *Patient Supplied* valACYclovir (VALTREX) tablet 1000 mg (1,000 mg Oral Self Administered 03/27/24 1708)    Testing Results: On my interpretation imaging is significant for: XR L shoulder and CT L shoulder with proximal humerus fracture and inferior dislocation L hip without fracture CT head without intracranial bleeding  See the EMR for full details regarding lab and imaging results.  Medical Decision Making BLIMIE BACHAND is a 83 y.o. female with significant PMH as above who presented to the ED as a non-leveled trauma secondary to mechanical fall.  ABCs intact. GCS 15.  Afebrile, hemodynamically stable. PE as below, notable for obvious deformity to L shoulder.   CT scan of the head was performed due to mechanical fall.  This was normal.  There is no evidence of intracranial bleeding.  X-rays of the left shoulder demonstrate proximal humerus fracture.   Given proximal humerus fracture I  did discuss with orthopedics.  They recommended CT of the left shoulder.  This was performed.  I did discuss back with them regarding inferior dislocation of the shoulder.  They are aware.  No further recommendations.  Given age of patient and likely surgical intervention patient was admitted to hospital medicine.  Patient had no cervical, thoracic or lumbar midline tenderness.  Therefore no spine imaging was warranted.  Patient had no chest wall tenderness or abdominal tenderness therefore no further imaging is warranted.  Patient did have some mild left hip tenderness therefore x-rays were performed which were negative.  Hospitalist will admit the patient to their service.  Amount and/or Complexity of Data Reviewed Radiology: ordered.  Risk Prescription drug management. Decision regarding hospitalization.     ### All radiography studies, electrocardiograms, and laboratory data were personally reviewed by me and incorporated into my medical decision making. Impression   1. Fall, initial encounter   2. Other closed displaced fracture of proximal end of left humerus, initial encounter      Note: Dragon medical dictation software was used in the creation of this note.     Arminda Landmark, MD 03/27/24 4098    Deatra Face, MD 03/28/24 [phone]

## 2024-03-27 NOTE — Progress Notes (Signed)
 Consult request received for comminuted left prox humerus fracture.  Multiple baseline medical and mobility concerns including RA, IDDM, CAD. Have discussed with the requesting MD patient's stability, pain control, and ongoing work up. I have reviewed x-rays and developed a provisional plan.   Full consultation to follow in the am. CT scan tonight. NPO p MN just in case decision made for surgical repair or replacement tomorrow.  Hardy Lia, MD [facility], [facility] [phone]

## 2024-03-27 NOTE — ED Provider Triage Note (Signed)
 Emergency Medicine Provider Triage Evaluation Note  [name] [name] , a 83 y.o. female  was evaluated in triage.  Pt complains of mechanical fall.  Complains of left shoulder and left hip pain.  Difficulty with ambulation.  Did not hit her head or lose consciousness.   Review of Systems  Positive:  Negative:   Physical Exam  BP (!) 142/68   Pulse (!) 52   Temp 97.8 F (36.6 C) (Oral)   Resp 18   Wt 60.8 kg   SpO2 96%   BMI 24.92 kg/m  Gen:   Awake, no distress   Resp:  Normal effort  MSK:   Pulses 2+, tenderness to left shoulder and left hip Other:    Medical Decision Making  Medically screening exam initiated at 3:25 PM.  Appropriate orders placed.  PAYTIENCE STRITTMATTER was informed that the remainder of the evaluation will be completed by another provider, this initial triage assessment does not replace that evaluation, and the importance of remaining in the ED until their evaluation is complete.  X-ray left shoulder and left hip   Felicie Horning, PA-C 03/27/24 1526

## 2024-03-28 DIAGNOSIS — E119 Type 2 diabetes mellitus without complications: Secondary | ICD-10-CM | POA: Diagnosis not present

## 2024-03-28 DIAGNOSIS — I25110 Atherosclerotic heart disease of native coronary artery with unstable angina pectoris: Secondary | ICD-10-CM

## 2024-03-28 DIAGNOSIS — D649 Anemia, unspecified: Secondary | ICD-10-CM

## 2024-03-28 DIAGNOSIS — B029 Zoster without complications: Secondary | ICD-10-CM | POA: Diagnosis not present

## 2024-03-28 DIAGNOSIS — Z794 Long term (current) use of insulin: Secondary | ICD-10-CM

## 2024-03-28 DIAGNOSIS — S42412A Displaced simple supracondylar fracture without intercondylar fracture of left humerus, initial encounter for closed fracture: Secondary | ICD-10-CM | POA: Diagnosis not present

## 2024-03-28 LAB — CBC
HCT: 29.8 % — ABNORMAL LOW (ref 36.0–46.0)
Hemoglobin: 10 g/dL — ABNORMAL LOW (ref 12.0–15.0)
MCH: 31.3 pg (ref 26.0–34.0)
MCHC: 33.6 g/dL (ref 30.0–36.0)
MCV: 93.1 fL (ref 80.0–100.0)
Platelets: 171 10*3/uL (ref 150–400)
RBC: 3.2 MIL/uL — ABNORMAL LOW (ref 3.87–5.11)
RDW: 14.5 % (ref 11.5–15.5)
WBC: 7.2 10*3/uL (ref 4.0–10.5)
nRBC: 0 % (ref 0.0–0.2)

## 2024-03-28 LAB — BASIC METABOLIC PANEL WITH GFR
Anion gap: 10 (ref 5–15)
BUN: 12 mg/dL (ref 8–23)
CO2: 17 mmol/L — ABNORMAL LOW (ref 22–32)
Calcium: 8.8 mg/dL — ABNORMAL LOW (ref 8.9–10.3)
Chloride: 110 mmol/L (ref 98–111)
Creatinine, Ser: 0.89 mg/dL (ref 0.44–1.00)
GFR, Estimated: >60 mL/min (ref >60)
Glucose, Bld: 160 mg/dL — ABNORMAL HIGH (ref 70–99)
Potassium: 4 mmol/L (ref 3.5–5.1)
Sodium: 137 mmol/L (ref 135–145)

## 2024-03-28 LAB — GLUCOSE, CAPILLARY
Glucose-Capillary: 140 mg/dL — ABNORMAL HIGH (ref 70–99)
Glucose-Capillary: 139 mg/dL — ABNORMAL HIGH (ref 70–99)
Glucose-Capillary: 146 mg/dL — ABNORMAL HIGH (ref 70–99)

## 2024-03-28 LAB — SURGICAL PCR SCREEN
MRSA, PCR: NEGATIVE
Staphylococcus aureus: NEGATIVE

## 2024-03-28 MED ORDER — ALPRAZOLAM 0.25 MG PO TABS
0.2500 mg | ORAL_TABLET | Freq: Every day | ORAL | Status: DC | PRN
  Administered 2024-03-29 – 2024-03-30 (×2): 0.25 mg via ORAL
  Filled 2024-03-28 (×2): qty 1

## 2024-03-28 MED ORDER — PANTOPRAZOLE SODIUM 40 MG PO TBEC
40.0000 mg | DELAYED_RELEASE_TABLET | Freq: Every day | ORAL | Status: DC
Start: 2024-03-28 — End: 2024-04-06
  Administered 2024-03-28 – 2024-04-06 (×9): 40 mg via ORAL
  Filled 2024-03-28 (×9): qty 1

## 2024-03-28 MED ORDER — METOPROLOL TARTRATE 25 MG PO TABS
25.0000 mg | ORAL_TABLET | Freq: Two times a day (BID) | ORAL | Status: DC
  Administered 2024-03-28 – 2024-04-04 (×13): 25 mg via ORAL
  Filled 2024-03-28 (×14): qty 1

## 2024-03-28 MED ORDER — ORAL CARE MOUTH RINSE: 15.0000 mL | OROMUCOSAL | Status: DC | PRN

## 2024-03-28 MED ORDER — HYDROMORPHONE HCL 1 MG/ML IJ SOLN
0.5000 mg | INTRAMUSCULAR | Status: DC | PRN
  Administered 2024-03-28 – 2024-04-05 (×11): 0.5 mg via INTRAVENOUS
  Filled 2024-03-28 (×11): qty 0.5

## 2024-03-28 MED ORDER — VALACYCLOVIR HCL 500 MG PO TABS
1000.0000 mg | ORAL_TABLET | Freq: Three times a day (TID) | ORAL | Status: DC
  Administered 2024-03-28 – 2024-03-30 (×8): 1000 mg via ORAL
  Filled 2024-03-28 (×8): qty 2

## 2024-03-28 MED ORDER — ACETAMINOPHEN 325 MG PO TABS: 650.0000 mg | ORAL_TABLET | Freq: Four times a day (QID) | ORAL | Status: DC | PRN

## 2024-03-28 MED ORDER — FOLIC ACID 1 MG PO TABS
1.0000 mg | ORAL_TABLET | Freq: Every evening | ORAL | Status: DC
  Administered 2024-03-28 – 2024-04-05 (×9): 1 mg via ORAL
  Filled 2024-03-28 (×9): qty 1

## 2024-03-28 MED ORDER — ISOSORBIDE MONONITRATE ER 60 MG PO TB24
60.0000 mg | ORAL_TABLET | Freq: Every day | ORAL | Status: DC
  Administered 2024-03-28 – 2024-04-04 (×7): 60 mg via ORAL
  Filled 2024-03-28 (×7): qty 1

## 2024-03-28 NOTE — Consult Note (Signed)
 Reason for Consult:Left humerus fx Referring Physician: Maylene Spear Time called: [id number] Time at bedside: 0940   [name] [name] is an 83 y.o. female.  HPI: [name] [name] lost her balance and fell yesterday. She had immediate left shoulder and hip pain and could not get up. She was brought to the ED where x-rays showed a left humerus fx and orthopedic surgery was consulted. She is RHD and lives at home alone. She does not use any assistive devices to ambulate.  Past Medical History:  Diagnosis Date   CAD (coronary artery disease)    s/p MI in 1997 tx with POBA to LCx // s/p CABG 11/2021   Carotid artery disease (HCC)    US  1/23: L 1-39   Diabetes mellitus    Drug therapy    Intermittent steroid use   Dyslipidemia    Edema    Ejection fraction    EF 60%, echo, November, 2011, trivial pericardial effusion // Echo 1/23: EF 60-65   HTN (hypertension) 12/29/2021   Rheumatoid arthritis(714.0)    Hospitalization August, 2011, severe RA flare,    Statin intolerance     Past Surgical History:  Procedure Laterality Date   ABDOMINAL HYSTERECTOMY     CHOLECYSTECTOMY     CORONARY ARTERY BYPASS GRAFT N/A 12/08/2021   Procedure: CORONARY ARTERY BYPASS GRAFTING (CABG) X 3, ON PUMP< USING LEFT INTERNAL MAMMARY ARTERY AND RIGHT ENDOSCOPIC GREATER SAPHENOUS VEIN CONDUITS;  Surgeon: Hilarie Lovely, MD;  Location: [facility] OR;  Service: Open Heart Surgery;  Laterality: N/A;   CORONARY STENT INTERVENTION N/A 05/22/2022   Procedure: CORONARY STENT INTERVENTION;  Surgeon: Lucendia Rusk, MD;  Location: [facility] INVASIVE CV LAB;  Service: Cardiovascular;  Laterality: N/A;   ENDOVEIN HARVEST OF GREATER SAPHENOUS VEIN Right 12/08/2021   Procedure: ENDOVEIN HARVEST OF GREATER SAPHENOUS VEIN;  Surgeon: Hilarie Lovely, MD;  Location: [facility] OR;  Service: Open Heart Surgery;  Laterality: Right;   LEFT HEART CATH AND CORONARY ANGIOGRAPHY N/A 01/10/2019   Procedure: LEFT HEART CATH AND CORONARY ANGIOGRAPHY;  Surgeon:  Swaz[place], Peter M, MD;  Location: [facility] INVASIVE CV LAB;  Service: Cardiovascular;  Laterality: N/A;   LEFT HEART CATH AND CORONARY ANGIOGRAPHY N/A 12/02/2021   Procedure: LEFT HEART CATH AND CORONARY ANGIOGRAPHY;  Surgeon: Arty Binning, MD;  Location: [facility] INVASIVE CV LAB;  Service: Cardiovascular;  Laterality: N/A;   LEFT HEART CATH AND CORS/GRAFTS ANGIOGRAPHY N/A 05/20/2022   Procedure: LEFT HEART CATH AND CORS/GRAFTS ANGIOGRAPHY;  Surgeon: Arleen Lacer, MD;  Location: [facility] INVASIVE CV LAB;  Service: Cardiovascular;  Laterality: N/A;   LEFT HEART CATH AND CORS/GRAFTS ANGIOGRAPHY N/A 05/21/2022   Procedure: LEFT HEART CATH AND CORS/GRAFTS ANGIOGRAPHY;  Surgeon: Odie Benne, MD;  Location: [facility] INVASIVE CV LAB;  Service: Cardiovascular;  Laterality: N/A;   TEE WITHOUT CARDIOVERSION N/A 12/08/2021   Procedure: TRANSESOPHAGEAL ECHOCARDIOGRAM (TEE);  Surgeon: Hilarie Lovely, MD;  Location: [facility] OR;  Service: Open Heart Surgery;  Laterality: N/A;    Family History  Problem Relation Age of Onset   Hypertension Mother    Diabetes Mother    Other Father    Breast cancer Neg Hx     Social History:  reports that she quit smoking about 29 years ago. Her smoking use included cigarettes. She has never used smokeless tobacco. She reports that she does not drink alcohol  and does not use drugs.  Allergies:  Allergies  Allergen Reactions   Hydrocodone Nausea Only   Statins Other (See  Comments)    Myalgia   Amaryl [Glimepiride] Other (See Comments)    Unknown reaction   Avandia [Rosiglitazone] Other (See Comments)    Arthralgia   Crestor  [Rosuvastatin ] Other (See Comments)    Myalgia    Ultram  [Tramadol ] Other (See Comments)    Hallucinations    Welchol [Colesevelam] Other (See Comments)    Sore throat   Zestril [Lisinopril] Cough   Zetia  [Ezetimibe ] Other (See Comments)    Myalgia    Zorprin [Aspirin ]    Januvia [Sitagliptin] Rash   Metformin And Related Other (See Comments)     Arthralgia   Vibra-Tab [Doxycycline] Swelling and Rash    Eye swelling   Wound Dressing Adhesive Rash    Medical tape adhesive    Medications: I have reviewed the patient's current medications.  Results for orders placed or performed during the hospital encounter of 03/27/24 (from the past 48 hours)  Glucose, capillary     Status: Abnormal   Collection Time: 03/27/24 10:05 PM  Result Value Ref Range   Glucose-Capillary 165 (H) 70 - 99 mg/dL    Comment: Glucose reference range applies only to samples taken after fasting for at least 8 hours.  Basic metabolic panel     Status: Abnormal   Collection Time: 03/27/24 10:59 PM  Result Value Ref Range   Sodium 135 135 - 145 mmol/L   Potassium 4.1 3.5 - 5.1 mmol/L   Chloride 105 98 - 111 mmol/L   CO2 21 (L) 22 - 32 mmol/L   Glucose, Bld 191 (H) 70 - 99 mg/dL    Comment: Glucose reference range applies only to samples taken after fasting for at least 8 hours.   BUN 13 8 - 23 mg/dL   Creatinine, Ser 0.98 0.44 - 1.00 mg/dL   Calcium  9.0 8.9 - 10.3 mg/dL   GFR, Estimated >11 >91 mL/min    Comment: (NOTE) Calculated using the CKD-EPI Creatinine Equation (2021)    Anion gap 9 5 - 15    Comment: Performed at [facility], [address] [place], [place] [address]  CBC     Status: Abnormal   Collection Time: 03/27/24 10:59 PM  Result Value Ref Range   WBC 7.5 4.0 - 10.5 K/uL   RBC 3.47 (L) 3.87 - 5.11 MIL/uL   Hemoglobin 10.8 (L) 12.0 - 15.0 g/dL   HCT 56.2 (L) 13.0 - 86.5 %   MCV 95.1 80.0 - 100.0 fL   MCH 31.1 26.0 - 34.0 pg   MCHC 32.7 30.0 - 36.0 g/dL   RDW 78.4 69.6 - 29.5 %   Platelets 169 150 - 400 K/uL   nRBC 0.0 0.0 - 0.2 %    Comment: Performed at [facility], [address], [place], [place] [address]  Protime-INR     Status: None   Collection Time: 03/27/24 10:59 PM  Result Value Ref Range   Prothrombin Time 15.0 11.4 - 15.2 seconds   INR 1.2 0.8 - 1.2    Comment: (NOTE) INR goal varies based on device  and disease states. Performed at [facility], [address] [place], [place] [address]     CT Head Wo Contrast Result Date: 03/27/2024 CLINICAL DATA:  Recent fall with headaches, initial encounter EXAM: CT HEAD WITHOUT CONTRAST TECHNIQUE: Contiguous axial images were obtained from the base of the skull through the vertex without intravenous contrast. RADIATION DOSE REDUCTION: This exam was performed according to the departmental dose-optimization program  which includes automated exposure control, adjustment of the mA and/or kV according to patient size and/or use of iterative reconstruction technique. COMPARISON:  None Available. FINDINGS: Brain: No evidence of acute infarction, hemorrhage, hydrocephalus, extra-axial collection or mass lesion/mass effect. Mild atrophic changes are noted. Vascular: No hyperdense vessel or unexpected calcification. Skull: Normal. Negative for fracture or focal lesion. Sinuses/Orbits: No acute finding. Other: None. IMPRESSION: Mild atrophic changes without acute abnormality. Electronically Signed   By: Violeta Grey M.D.   On: 03/27/2024 19:35   CT Shoulder Left Wo Contrast Result Date: 03/27/2024 CLINICAL DATA:  Shoulder trauma after fall. EXAM: CT OF THE UPPER LEFT EXTREMITY WITHOUT CONTRAST TECHNIQUE: Multidetector CT imaging of the upper left extremity was performed according to the standard protocol. RADIATION DOSE REDUCTION: This exam was performed according to the departmental dose-optimization program which includes automated exposure control, adjustment of the mA and/or kV according to patient size and/or use of iterative reconstruction technique. COMPARISON:  Same day radiographs of the left shoulder at 3:49 p.m. FINDINGS: Bones/Joint/Cartilage Acute comminuted fracture of the left humeral head with transverse fracture component at the level of the surgical neck. There is full shaft width medial displacement of the distal humeral shaft component. Comminuted  fracture margins extend through the humeral head articular surface with impaction of the medial and inferior humeral head on the inferior glenoid. There is a displaced and rotated inferior humeral head articular surface fracture fragment adjacent to the inferior glenoid (series 3, image 44 and series 9, images 53-59). Fracture margins extend through the greater and lesser tuberosities. Inferior dislocation of the humeral head relative to the glenoid. There is a left glenohumeral joint effusion with lipohemarthrosis. Subchondral sclerosis and cystic changes of the glenoid and humeral head are noted. The acromioclavicular joint is anatomically aligned with mild degenerative changes. Prior median sternotomy. Degenerative changes of the lower cervical spine. Ligaments Ligaments are suboptimally evaluated by CT. Muscles and Tendons Intramuscular edema of the left shoulder musculature, surrounding the left proximal humerus fracture described above. No discrete intramuscular collection. Soft tissue Soft tissue swelling subcutaneous edema extending along the left shoulder proximal left arm. Aortic atherosclerosis. IMPRESSION: 1. Acute comminuted and displaced intra-articular fracture of the left humeral head and neck, as described above. Inferior dislocation of the humeral head relative to the glenoid. 2. Associated soft tissue and intramuscular edema of the left shoulder. Electronically Signed   By: Mannie Seek M.D.   On: 03/27/2024 19:19   DG Hip Unilat W or Wo Pelvis 2-3 Views Left Result Date: 03/27/2024 CLINICAL DATA:  Pain after fall. EXAM: DG HIP (WITH OR WITHOUT PELVIS) 2-3V LEFT COMPARISON:  None Available. FINDINGS: No evidence of acute fracture of the pelvis or left hip. No hip dislocation. Mild bilateral hip osteoarthritis with joint space narrowing and spurring. The pubic rami are intact. Pubic symphysis and sacroiliac joints are congruent. Vascular calcifications are seen. IMPRESSION: 1. No acute  fracture or subluxation of the pelvis or left hip. 2. Mild bilateral hip osteoarthritis. Electronically Signed   By: Chadwick Colonel M.D.   On: 03/27/2024 16:28   DG Shoulder Left Result Date: 03/27/2024 CLINICAL DATA:  Pain after fall.  Landed on left shoulder. EXAM: LEFT SHOULDER - 2+ VIEW COMPARISON:  None Available. FINDINGS: Comminuted displaced proximal humerus fracture. The dominant fracture plane is through the surgical neck with medial displacement of the humeral shaft. There is involvement of the lateral humeral head. The fracture extends to the inferior aspect of the glenohumeral joint with a  rotational component to the humeral head. There is widening of the upper aspect of the glenohumeral joint. No definite glenohumeral dislocation on provided views. Mild chronic acromioclavicular osteoarthritis. IMPRESSION: 1. Comminuted displaced proximal humerus fracture, fracture approaches the glenohumeral joint. 2. Widening of the upper aspect of the glenohumeral joint. No definite glenohumeral dislocation on provided views. Electronically Signed   By: Chadwick Colonel M.D.   On: 03/27/2024 16:26    Review of Systems  HENT:  Negative for ear discharge, ear pain, hearing loss and tinnitus.   Eyes:  Negative for photophobia and pain.  Respiratory:  Negative for cough and shortness of breath.   Cardiovascular:  Negative for chest pain.  Gastrointestinal:  Negative for abdominal pain, nausea and vomiting.  Genitourinary:  Negative for dysuria, flank pain, frequency and urgency.  Musculoskeletal:  Positive for arthralgias (Left shoulder and hip). Negative for back pain, myalgias and neck pain.  Neurological:  Negative for dizziness and headaches.  Hematological:  Does not bruise/bleed easily.  Psychiatric/Behavioral:  The patient is not nervous/anxious.    Blood pressure (!) 171/60, pulse 62, temperature 97.6 F (36.4 C), temperature source Oral, resp. rate 17, height 5' 1.5" (1.562 m), weight 60.8  kg, SpO2 100%. Physical Exam Constitutional:      General: She is not in acute distress.    Appearance: She is well-developed. She is not diaphoretic.  HENT:     Head: Normocephalic and atraumatic.  Eyes:     General: No scleral icterus.       Right eye: No discharge.        Left eye: No discharge.     Conjunctiva/sclera: Conjunctivae normal.  Cardiovascular:     Rate and Rhythm: Normal rate and regular rhythm.  Pulmonary:     Effort: Pulmonary effort is normal. No respiratory distress.  Musculoskeletal:     Cervical back: Normal range of motion.     Comments: Left shoulder, elbow, wrist, digits- no skin wounds, mod TTP, no instability, no blocks to motion  Sens  Ax/R/M/U intact  Mot   Ax/ R/ PIN/ M/ AIN/ U intact  Rad 2+  Skin:    General: Skin is warm and dry.  Neurological:     Mental Status: She is alert.  Psychiatric:        Mood and Affect: Mood normal.        Behavior: Behavior normal.    Assessment/Plan: Left humerus fx -- Plan reverse arthroplasty Thursday by Dr. Rozelle Corning. Please keep NPO after MN Wednesday.    Georganna Kin, PA-C Orthopedic Surgery [phone] 03/28/2024, 9:53 AM

## 2024-03-28 NOTE — Progress Notes (Signed)
 Transition of Care [facility]) - CAGE-AID Screening   Patient Details  Name: [name] [name] MRN: [id number] Date of Birth: 1940/11/18   Asa Bjork, RN Trauma Response Nurse Phone Number: [phone] 03/28/2024, 5:34 PM    CAGE-AID Screening:    Have You Ever Felt You Ought to Cut Down on Your Drinking or Drug Use?: No Have People Annoyed You By Critizing Your Drinking Or Drug Use?: No Have You Felt Bad Or Guilty About Your Drinking Or Drug Use?: No Have You Ever Had a Drink or Used Drugs First Thing In The Morning to Steady Your Nerves or to Get Rid of a Hangover?: No CAGE-AID Score: 0  Substance Abuse Education Offered: (S) No (declines resources - no resources needed)

## 2024-03-28 NOTE — Progress Notes (Signed)
 [facility] PROGRESS NOTE   [name] [name] ZHY:[id number] DOB: 07/16/1941 DOA: 03/27/2024  PCP: Pete Brand, DO  Brief History: 83 y.o. female with medical history significant of RA on methotrexate , type II DM, CAD status post CABG, brought to the emergency department for evaluation after mechanical fall.  She reports left arm and left hip pain and subsequent decreased range of motion of the left upper and lower extremities.  No paresthesias or change in sensation. She was diagnosed with zoster and started acyclovir prior to the fall.  She reports the symptoms are improving. She is not on any anticoagulants.  ED workup significant for left humeral fracture.  She was hospitalized for further management.  Consultants: Orthopedics  Procedures: None yet    Subjective/Interval History: Patient complains of pain in the left arm.  Also complains of pain in the right side from her shingles.    Assessment/Plan:  Left humerus fracture Management per orthopedics.  Pain control.  Herpes zoster right chest/flank area Recently diagnosed.  Started on Valtrex.  Contact precautions for now.  History of rheumatoid arthritis On methotrexate  at home which is currently on hold.  Coronary artery disease She is status post PCI and CABG.  Last PCI was in 2023.  Drug-eluting stent was placed in mid LAD at that time. Cardiac status is stable.  Aspirin  and Plavix  is on hold.  Continue with metoprolol .  Noted to be on nitrates. ARB is on hold. Elevated blood pressure readings most likely secondary to pain.  Continue to monitor for now.  Normocytic anemia Labs are pending from today.  No evidence of overt bleeding.  Diabetes mellitus type 2 On SSI. Prior to admission patient was on 75/25 insulin . HbA1c not recently checked.  Will add to labs for tomorrow.  DVT Prophylaxis: SCDs for now.  Patient was on aspirin  and Plavix  for cardiac reasons prior to admission which will need to be  resumed when okay with orthopedics. Code Status: Full code Family Communication: Discussed with her daughter Disposition Plan: To be determined  Status is: Inpatient Remains inpatient appropriate because: Left humerus fracture requiring surgical intervention      Medications: Scheduled:  docusate sodium   100 mg Oral BID   folic acid   1 mg Oral QPM   insulin  aspart  0-5 Units Subcutaneous QHS   insulin  aspart  3 Units Subcutaneous TID WC   isosorbide  mononitrate  60 mg Oral Daily   metoprolol  tartrate  25 mg Oral BID   pantoprazole   40 mg Oral Daily   valACYclovir  1,000 mg Oral TID   Continuous: PRN:acetaminophen , ALPRAZolam, oxyCODONE   Antibiotics: On valacyclovir  Objective:  Vital Signs  Vitals:   03/27/24 1552 03/27/24 1824 03/27/24 2147 03/28/24 0759  BP:  (!) 152/71 (!) 184/70 (!) 171/60  Pulse:  (!) 59 (!) 59 62  Resp:  20 16 17   Temp:  97.7 F (36.5 C) 98.1 F (36.7 C) 97.6 F (36.4 C)  TempSrc:  Oral  Oral  SpO2:  100% 99% 100%  Weight: 60.8 kg     Height: 5' 1.5" (1.562 m)      No intake or output data in the 24 hours ending 03/28/24 1034 Filed Weights   03/27/24 1506 03/27/24 1552  Weight: 60.8 kg 60.8 kg    General appearance: Awake alert.  In no distress Resp: Clear to auscultation bilaterally.  Normal effort Cardio: S1-S2 is normal regular.  No S3-S4.  No rubs murmurs or bruit GI: Abdomen is soft.  Nontender nondistended.  Bowel sounds are present normal.  No masses organomegaly Extremities: Left arm is in a sling. His zoster lesions noted in the right lower chest and flank going posteriorly.  Full examination could not be done as patient could not be mobilized due to her fracture.  Lab Results:  Data Reviewed: I have personally reviewed following labs and reports of the imaging studies  CBC: Recent Labs  Lab 03/27/24 2259  WBC 7.5  HGB 10.8*  HCT 33.0*  MCV 95.1  PLT 169    Basic Metabolic Panel: Recent Labs  Lab 03/27/24 2259   NA 135  K 4.1  CL 105  CO2 21*  GLUCOSE 191*  BUN 13  CREATININE 0.79  CALCIUM  9.0    GFR: Estimated Creatinine Clearance: 45.2 mL/min (by C-G formula based on SCr of 0.79 mg/dL).   Coagulation Profile: Recent Labs  Lab 03/27/24 2259  INR 1.2    CBG: Recent Labs  Lab 03/27/24 2205  GLUCAP 165*     Radiology Studies: CT Head Wo Contrast Result Date: 03/27/2024 CLINICAL DATA:  Recent fall with headaches, initial encounter EXAM: CT HEAD WITHOUT CONTRAST TECHNIQUE: Contiguous axial images were obtained from the base of the skull through the vertex without intravenous contrast. RADIATION DOSE REDUCTION: This exam was performed according to the departmental dose-optimization program which includes automated exposure control, adjustment of the mA and/or kV according to patient size and/or use of iterative reconstruction technique. COMPARISON:  None Available. FINDINGS: Brain: No evidence of acute infarction, hemorrhage, hydrocephalus, extra-axial collection or mass lesion/mass effect. Mild atrophic changes are noted. Vascular: No hyperdense vessel or unexpected calcification. Skull: Normal. Negative for fracture or focal lesion. Sinuses/Orbits: No acute finding. Other: None. IMPRESSION: Mild atrophic changes without acute abnormality. Electronically Signed   By: Violeta Grey M.D.   On: 03/27/2024 19:35   CT Shoulder Left Wo Contrast Result Date: 03/27/2024 CLINICAL DATA:  Shoulder trauma after fall. EXAM: CT OF THE UPPER LEFT EXTREMITY WITHOUT CONTRAST TECHNIQUE: Multidetector CT imaging of the upper left extremity was performed according to the standard protocol. RADIATION DOSE REDUCTION: This exam was performed according to the departmental dose-optimization program which includes automated exposure control, adjustment of the mA and/or kV according to patient size and/or use of iterative reconstruction technique. COMPARISON:  Same day radiographs of the left shoulder at 3:49 p.m.  FINDINGS: Bones/Joint/Cartilage Acute comminuted fracture of the left humeral head with transverse fracture component at the level of the surgical neck. There is full shaft width medial displacement of the distal humeral shaft component. Comminuted fracture margins extend through the humeral head articular surface with impaction of the medial and inferior humeral head on the inferior glenoid. There is a displaced and rotated inferior humeral head articular surface fracture fragment adjacent to the inferior glenoid (series 3, image 44 and series 9, images 53-59). Fracture margins extend through the greater and lesser tuberosities. Inferior dislocation of the humeral head relative to the glenoid. There is a left glenohumeral joint effusion with lipohemarthrosis. Subchondral sclerosis and cystic changes of the glenoid and humeral head are noted. The acromioclavicular joint is anatomically aligned with mild degenerative changes. Prior median sternotomy. Degenerative changes of the lower cervical spine. Ligaments Ligaments are suboptimally evaluated by CT. Muscles and Tendons Intramuscular edema of the left shoulder musculature, surrounding the left proximal humerus fracture described above. No discrete intramuscular collection. Soft tissue Soft tissue swelling subcutaneous edema extending along the left shoulder proximal left arm. Aortic atherosclerosis. IMPRESSION: 1. Acute comminuted  and displaced intra-articular fracture of the left humeral head and neck, as described above. Inferior dislocation of the humeral head relative to the glenoid. 2. Associated soft tissue and intramuscular edema of the left shoulder. Electronically Signed   By: Mannie Seek M.D.   On: 03/27/2024 19:19   DG Hip Unilat W or Wo Pelvis 2-3 Views Left Result Date: 03/27/2024 CLINICAL DATA:  Pain after fall. EXAM: DG HIP (WITH OR WITHOUT PELVIS) 2-3V LEFT COMPARISON:  None Available. FINDINGS: No evidence of acute fracture of the pelvis or  left hip. No hip dislocation. Mild bilateral hip osteoarthritis with joint space narrowing and spurring. The pubic rami are intact. Pubic symphysis and sacroiliac joints are congruent. Vascular calcifications are seen. IMPRESSION: 1. No acute fracture or subluxation of the pelvis or left hip. 2. Mild bilateral hip osteoarthritis. Electronically Signed   By: Chadwick Colonel M.D.   On: 03/27/2024 16:28   DG Shoulder Left Result Date: 03/27/2024 CLINICAL DATA:  Pain after fall.  Landed on left shoulder. EXAM: LEFT SHOULDER - 2+ VIEW COMPARISON:  None Available. FINDINGS: Comminuted displaced proximal humerus fracture. The dominant fracture plane is through the surgical neck with medial displacement of the humeral shaft. There is involvement of the lateral humeral head. The fracture extends to the inferior aspect of the glenohumeral joint with a rotational component to the humeral head. There is widening of the upper aspect of the glenohumeral joint. No definite glenohumeral dislocation on provided views. Mild chronic acromioclavicular osteoarthritis. IMPRESSION: 1. Comminuted displaced proximal humerus fracture, fracture approaches the glenohumeral joint. 2. Widening of the upper aspect of the glenohumeral joint. No definite glenohumeral dislocation on provided views. Electronically Signed   By: Chadwick Colonel M.D.   On: 03/27/2024 16:26       LOS: 1 day   [name] [name]  [facility] Pager on [email or web address]  03/28/2024, 10:34 AM

## 2024-03-28 NOTE — Progress Notes (Signed)
 PT Cancellation Note  Patient Details Name: [name] [name] MRN: [id number] DOB: 10/31/1941   Cancelled Treatment:    Reason Eval/Treat Not Completed: Medical issues which prohibited therapy. Chart indicates further orthopedics workup planned for this AM. Most recent internal medicine node with recommendations for bedrest. PT will follow up after further orthopedics recommendations are documented and the pt is able to mobilize.    Rexie Catena 03/28/2024, 7:44 AM

## 2024-03-28 NOTE — Progress Notes (Addendum)
 Presents after mechanical fall. Suffered Left humerus fracture. From home alone. Supportive daughters,   03/28/24 1113  TOC Brief Assessment  Insurance and Status Reviewed  Patient has primary care physician Yes  Home environment has been reviewed From home alone.  Prior level of function: PTA independent with ADL's, no DME usage.  Prior/Current Home Services No current home services  Social Drivers of Health Review SDOH reviewed no interventions necessary  Readmission risk has been reviewed No  Transition of care needs no transition of care needs at this time   Ortho consult pending... 5/13  Left humerus fx -- Plan reverse arthroplasty Thursday by Dr. Dalton Duff team following and will assist with needs. Carlee Charters RN,BSN,CM [phone]

## 2024-03-28 NOTE — Plan of Care (Signed)

## 2024-03-29 ENCOUNTER — Inpatient Hospital Stay (HOSPITAL_COMMUNITY)

## 2024-03-29 DIAGNOSIS — S42302A Unspecified fracture of shaft of humerus, left arm, initial encounter for closed fracture: Secondary | ICD-10-CM

## 2024-03-29 DIAGNOSIS — S42412A Displaced simple supracondylar fracture without intercondylar fracture of left humerus, initial encounter for closed fracture: Secondary | ICD-10-CM | POA: Diagnosis not present

## 2024-03-29 LAB — BASIC METABOLIC PANEL WITH GFR
Anion gap: 7 (ref 5–15)
BUN: 10 mg/dL (ref 8–23)
CO2: 22 mmol/L (ref 22–32)
Calcium: 8.6 mg/dL — ABNORMAL LOW (ref 8.9–10.3)
Chloride: 105 mmol/L (ref 98–111)
Creatinine, Ser: 0.84 mg/dL (ref 0.44–1.00)
GFR, Estimated: >60 mL/min (ref >60)
Glucose, Bld: 131 mg/dL — ABNORMAL HIGH (ref 70–99)
Potassium: 4.1 mmol/L (ref 3.5–5.1)
Sodium: 134 mmol/L — ABNORMAL LOW (ref 135–145)

## 2024-03-29 LAB — CBC
HCT: 31.1 % — ABNORMAL LOW (ref 36.0–46.0)
Hemoglobin: 10.3 g/dL — ABNORMAL LOW (ref 12.0–15.0)
MCH: 30.7 pg (ref 26.0–34.0)
MCHC: 33.1 g/dL (ref 30.0–36.0)
MCV: 92.8 fL (ref 80.0–100.0)
Platelets: 170 10*3/uL (ref 150–400)
RBC: 3.35 MIL/uL — ABNORMAL LOW (ref 3.87–5.11)
RDW: 14.5 % (ref 11.5–15.5)
WBC: 7.6 10*3/uL (ref 4.0–10.5)
nRBC: 0 % (ref 0.0–0.2)

## 2024-03-29 LAB — MAGNESIUM: Magnesium: 1.9 mg/dL (ref 1.7–2.4)

## 2024-03-29 LAB — GLUCOSE, CAPILLARY
Glucose-Capillary: 118 mg/dL — ABNORMAL HIGH (ref 70–99)
Glucose-Capillary: 176 mg/dL — ABNORMAL HIGH (ref 70–99)
Glucose-Capillary: 111 mg/dL — ABNORMAL HIGH (ref 70–99)
Glucose-Capillary: 111 mg/dL — ABNORMAL HIGH (ref 70–99)

## 2024-03-29 MED ORDER — DIAZEPAM 5 MG PO TABS
5.0000 mg | ORAL_TABLET | Freq: Once | ORAL | Status: AC
  Administered 2024-03-29: 5 mg via ORAL
  Filled 2024-03-29: qty 1

## 2024-03-29 MED ORDER — IOHEXOL 350 MG/ML SOLN
75.0000 mL | Freq: Once | INTRAVENOUS | Status: AC | PRN
  Administered 2024-03-29: 75 mL via INTRAVENOUS

## 2024-03-29 NOTE — Progress Notes (Addendum)
 Patient with complex proximal left humerus fracture Open reduction internal fixation not an option for this particular fracture Reverse shoulder replacement gives her the best chance that having some degree of function in that left shoulder Risk and benefits discussed.  Including not limited to infection nerve or vessel damage incomplete pain relief as well as incomplete restoration of function. Perioperative events from the stress of surgery including heart attack stroke also discussed Patient does have osteopenia and some degree of glenoid deficiency which could also complicate the operative course.  Axillary nerve functions but this has been somewhat difficult due to pain.  No paresthesias on that left-hand side in the axillary distribution.  EPL FPL interosseous is intact.  Hand on the left is warm and perfused but the radial pulse and ulnar is not palpable on the left.  Radial is 3+ out of 4 palpable on the right. History of coronary artery bypass grafting 2 years ago but they did not use the radial artery on the left-hand side  Vascular consult to evaluate asymmetry of distal upper extremity pulses.  The fracture itself is displaced but whether or not that enough to impinge on arterial flow is difficult to say.Will order CTA left upper extremity  in its current condition her shoulder function would be poor.  I think we could improve it with surgical intervention in this case.  All questions answered

## 2024-03-29 NOTE — Progress Notes (Signed)
 PT Cancellation Note  Patient Details Name: [name] [name] MRN: [id number] DOB: 1941/03/27   Cancelled Treatment:    Reason Eval/Treat Not Completed: (P) Other (comment). Per chart, plan is for reverse arthroplasty tomorrow. Pt would likely benefit more from a PT Eval once the surgery is complete, especially since the surgery is only x1 day away. Will plan to follow-up after surgery unless it becomes postponed.    [name] [name], PT, DPT Acute Rehabilitation Services  Office: [phone]      [name] [name] 03/29/2024, 7:38 AM

## 2024-03-29 NOTE — Consult Note (Signed)
 Hospital Consult    Reason for Consult: Concern for asymmetric pulses after left humerus fracture Referring Physician:  Dr. Rozelle Corning, orthopedics MRN #:  [id number]  History of Present Illness: This is a 83 y.o. female with history of diabetes, hypertension, hyperlipidemia that vascular surgery has been consulted with concern for asymmetric pulses in the setting of left humerus fracture.  Patient states she had a mechanical fall at [facility] on Monday causing her left humerus fracture.  She denies any left hand pain or numbness.  Has had a prior CABG requiring radial access on the left for radial a-line.  Past Medical History:  Diagnosis Date   CAD (coronary artery disease)    s/p MI in 1997 tx with POBA to LCx // s/p CABG 11/2021   Carotid artery disease (HCC)    US  1/23: L 1-39   Diabetes mellitus    Drug therapy    Intermittent steroid use   Dyslipidemia    Edema    Ejection fraction    EF 60%, echo, November, 2011, trivial pericardial effusion // Echo 1/23: EF 60-65   HTN (hypertension) 12/29/2021   Rheumatoid arthritis(714.0)    Hospitalization August, 2011, severe RA flare,    Statin intolerance     Past Surgical History:  Procedure Laterality Date   ABDOMINAL HYSTERECTOMY     CHOLECYSTECTOMY     CORONARY ARTERY BYPASS GRAFT N/A 12/08/2021   Procedure: CORONARY ARTERY BYPASS GRAFTING (CABG) X 3, ON PUMP< USING LEFT INTERNAL MAMMARY ARTERY AND RIGHT ENDOSCOPIC GREATER SAPHENOUS VEIN CONDUITS;  Surgeon: Hilarie Lovely, MD;  Location: [facility] OR;  Service: Open Heart Surgery;  Laterality: N/A;   CORONARY STENT INTERVENTION N/A 05/22/2022   Procedure: CORONARY STENT INTERVENTION;  Surgeon: Lucendia Rusk, MD;  Location: [facility] INVASIVE CV LAB;  Service: Cardiovascular;  Laterality: N/A;   ENDOVEIN HARVEST OF GREATER SAPHENOUS VEIN Right 12/08/2021   Procedure: ENDOVEIN HARVEST OF GREATER SAPHENOUS VEIN;  Surgeon: Hilarie Lovely, MD;  Location: [facility] OR;  Service: Open Heart  Surgery;  Laterality: Right;   LEFT HEART CATH AND CORONARY ANGIOGRAPHY N/A 01/10/2019   Procedure: LEFT HEART CATH AND CORONARY ANGIOGRAPHY;  Surgeon: Swaziland, Peter M, MD;  Location: [facility] INVASIVE CV LAB;  Service: Cardiovascular;  Laterality: N/A;   LEFT HEART CATH AND CORONARY ANGIOGRAPHY N/A 12/02/2021   Procedure: LEFT HEART CATH AND CORONARY ANGIOGRAPHY;  Surgeon: Arty Binning, MD;  Location: [facility] INVASIVE CV LAB;  Service: Cardiovascular;  Laterality: N/A;   LEFT HEART CATH AND CORS/GRAFTS ANGIOGRAPHY N/A 05/20/2022   Procedure: LEFT HEART CATH AND CORS/GRAFTS ANGIOGRAPHY;  Surgeon: Arleen Lacer, MD;  Location: [facility] INVASIVE CV LAB;  Service: Cardiovascular;  Laterality: N/A;   LEFT HEART CATH AND CORS/GRAFTS ANGIOGRAPHY N/A 05/21/2022   Procedure: LEFT HEART CATH AND CORS/GRAFTS ANGIOGRAPHY;  Surgeon: Odie Benne, MD;  Location: [facility] INVASIVE CV LAB;  Service: Cardiovascular;  Laterality: N/A;   TEE WITHOUT CARDIOVERSION N/A 12/08/2021   Procedure: TRANSESOPHAGEAL ECHOCARDIOGRAM (TEE);  Surgeon: Hilarie Lovely, MD;  Location: [facility] OR;  Service: Open Heart Surgery;  Laterality: N/A;    Allergies  Allergen Reactions   Hydrocodone Nausea Only   Statins Other (See Comments)    Myalgia   Amaryl [Glimepiride] Other (See Comments)    Unknown reaction   Avandia [Rosiglitazone] Other (See Comments)    Arthralgia   Crestor  Bird.Brandy ] Other (See Comments)    Myalgia    Ultram  [Tramadol ] Other (See Comments)    Hallucinations  Welchol [Colesevelam] Other (See Comments)    Sore throat   Zestril [Lisinopril] Cough   Zetia  [Ezetimibe ] Other (See Comments)    Myalgia    Zorprin [Aspirin ]    Januvia [Sitagliptin] Rash   Metformin And Related Other (See Comments)    Arthralgia   Vibra-Tab [Doxycycline] Swelling and Rash    Eye swelling   Wound Dressing Adhesive Rash    Medical tape adhesive    Prior to Admission medications   Medication Sig Start Date End Date Taking?  Authorizing Provider  Abatacept  125 MG/ML SOSY Inject 1 Dose into the skin every 30 (thirty) days.   Yes [provider]  acetaminophen  (TYLENOL ) 325 MG tablet Take 2 tablets (650 mg total) by mouth every 6 (six) hours as needed for moderate pain, fever or headache. 12/16/21  Yes Barrett, Erin R, PA-C  ALPRAZolam (XANAX) 0.25 MG tablet Take 0.25 mg by mouth daily as needed. 07/17/23  Yes [provider]  aspirin  81 MG tablet Take 1 tablet (81 mg total) by mouth daily. Patient taking differently: Take 81 mg by mouth in the morning. 07/26/13  Yes Deena Farrier, MD  Cholecalciferol (VITAMIN D-3) 1000 units CAPS Take 1,000 Units by mouth in the morning.   Yes [provider]  clopidogrel  (PLAVIX ) 75 MG tablet TAKE 1 TABLET(75 MG) BY MOUTH DAILY WITH BREAKFAST Patient taking differently: Take 75 mg by mouth daily with breakfast. 11/15/23  Yes Nahser, Lela Purple, MD  folic acid  (FOLVITE ) 1 MG tablet Take 1 mg by mouth every evening.    Yes [provider]  furosemide  (LASIX ) 40 MG tablet Take 1 tablet (40 mg total) by mouth as needed for fluid (wt gain of 3 lbs in 24 hours.). 12/30/21  Yes Weaver, Geralyn Knee T, PA-C  Insulin  Lispro Prot & Lispro (HUMALOG 75/25 MIX) (75-25) 100 UNIT/ML Kwikpen Inject 3-5 Units into the skin in the morning. 06/28/21  Yes [provider]  isosorbide  mononitrate (IMDUR ) 60 MG 24 hr tablet Take 1 tablet (60 mg total) by mouth daily. 12/20/23  Yes Florette Hurry, NP  losartan  (COZAAR ) 25 MG tablet TAKE 1 TABLET(25 MG) BY MOUTH DAILY Patient taking differently: Take 25 mg by mouth in the morning. 08/31/23  Yes Nahser, Lela Purple, MD  methotrexate  250 MG/10ML injection Inject 10 mg into the muscle once a week. On Tuesdays 06/29/19  Yes [provider]  metoprolol  tartrate (LOPRESSOR ) 25 MG tablet Take 1 tablet (25 mg total) by mouth 2 (two) times daily. Patient taking differently: Take 12.5 mg by mouth 2 (two) times daily. 12/20/23   Yes Nahser, Lela Purple, MD  Multiple Vitamin (MULTIVITAMIN WITH MINERALS) TABS tablet Take 1 tablet by mouth daily. Centrum silver   Yes [provider]  nitroGLYCERIN  (NITROSTAT ) 0.4 MG SL tablet Place 1 tablet (0.4 mg total) under the tongue every 5 (five) minutes as needed. 05/23/22  Yes Bhagat, Bhavinkumar, PA  pantoprazole  (PROTONIX ) 40 MG tablet Take 40 mg by mouth daily.   Yes [provider]  potassium chloride  (KLOR-CON ) 10 MEQ tablet Take 1 tablet (10 mEq total) by mouth as needed (wt gain of 3 lbs in 24 hours to be taken with Lasix ). 12/30/21  Yes Weaver, Geralyn Knee T, PA-C  valACYclovir (VALTREX) 1000 MG tablet Take 1,000 mg by mouth 3 (three) times daily. For 7 days 03/27/24  Yes [provider]  B-D UF III MINI PEN NEEDLES 31G X 5 MM MISC USE WITH INSULIN  TO INJECT BID UTD 02/14/19  [provider]  Alisia Irons test strip USE 1 STRIP TID UTD 01/07/19   [provider]    Social History   Socioeconomic History   Marital status: Widowed    Spouse name: Not on file   Number of children: Not on file   Years of education: Not on file   Highest education level: Not on file  Occupational History   Not on file  Tobacco Use   Smoking status: Former    Current packs/day: 0.00    Types: Cigarettes    Quit date: 11/16/1994    Years since quitting: 29.3   Smokeless tobacco: Never  Vaping Use   Vaping status: Never Used  Substance and Sexual Activity   Alcohol  use: No    Alcohol /week: 0.0 standard drinks of alcohol    Drug use: No   Sexual activity: Not Currently  Other Topics Concern   Not on file  Social History Narrative   Not on file   Social Drivers of Health   Financial Resource Strain: Not on file  Food Insecurity: No Food Insecurity (03/27/2024)   Hunger Vital Sign    Worried About Running Out of Food in the Last Year: Never true    Ran Out of Food in the Last Year: Never true  Transportation Needs: No Transportation Needs  (03/27/2024)   PRAPARE - Administrator, Civil Service (Medical): No    Lack of Transportation (Non-Medical): No  Physical Activity: Not on file  Stress: Not on file  Social Connections: Moderately Integrated (03/27/2024)   Social Connection and Isolation Panel [NHANES]    Frequency of Communication with Friends and Family: Three times a week    Frequency of Social Gatherings with Friends and Family: Three times a week    Attends Religious Services: 1 to 4 times per year    Active Member of Clubs or Organizations: Yes    Attends Banker Meetings: More than 4 times per year    Marital Status: Widowed  Intimate Partner Violence: Patient Declined (03/27/2024)   Humiliation, Afraid, Rape, and Kick questionnaire    Fear of Current or Ex-Partner: Patient declined    Emotionally Abused: Patient declined    Physically Abused: Patient declined    Sexually Abused: Patient declined     Family History  Problem Relation Age of Onset   Hypertension Mother    Diabetes Mother    Other Father    Breast cancer Neg Hx     ROS: [x]  Positive   [ ]  Negative   [ ]  All sytems reviewed and are negative  Cardiovascular: []  chest pain/pressure []  palpitations []  SOB lying flat []  DOE []  pain in legs while walking []  pain in legs at rest []  pain in legs at night []  non-healing ulcers []  hx of DVT []  swelling in legs  Pulmonary: []  productive cough []  asthma/wheezing []  home O2  Neurologic: []  weakness in []  arms []  legs []  numbness in []  arms []  legs []  hx of CVA []  mini stroke [] difficulty speaking or slurred speech []  temporary loss of vision in one eye []  dizziness  Hematologic: []  hx of cancer []  bleeding problems []  problems with blood clotting easily  Endocrine:   []  diabetes []  thyroid  disease  GI []  vomiting blood []  blood in stool  GU: []  CKD/renal failure []  HD--[]  M/W/F or []  T/T/S []  burning with urination []  blood in  urine  Psychiatric: []  anxiety []  depression  Musculoskeletal: []  arthritis []   joint pain  Integumentary: []  rashes []  ulcers  Constitutional: []  fever []  chills   Physical Examination  Vitals:   03/29/24 1442 03/29/24 2019  BP: (!) 148/66 (!) 156/56  Pulse: 78 72  Resp: 16   Temp: 98.7 F (37.1 C) 98.2 F (36.8 C)  SpO2: 100% 95%   Body mass index is 24.92 kg/m.  General:  NAD Gait: Not observed HENT: WNL, normocephalic Pulmonary: normal non-labored breathing Cardiac: regular, without  Murmurs, rubs or gallops Abdomen:  soft, NT/ND, no masses Vascular Exam/Pulses: Right radial pulse 2+ palpable Left ulnar pulse palpable and radial pulse with multiphasic brisk Doppler signal Left palmar arch signal brisk by Doppler Extremities: No ischemic changes left hand Musculoskeletal: no muscle wasting or atrophy  Neurologic: A&O X 3; Appropriate Affect ; SENSATION: normal; MOTOR FUNCTION:  moving all extremities equally. Speech is fluent/normal  CBC    Component Value Date/Time   WBC 7.6 03/29/2024 0641   RBC 3.35 (L) 03/29/2024 0641   HGB 10.3 (L) 03/29/2024 0641   HGB 11.2 03/19/2022 1026   HCT 31.1 (L) 03/29/2024 0641   HCT 33.6 (L) 03/19/2022 1026   PLT 170 03/29/2024 0641   PLT 269 03/19/2022 1026   MCV 92.8 03/29/2024 0641   MCV 87 03/19/2022 1026   MCH 30.7 03/29/2024 0641   MCHC 33.1 03/29/2024 0641   RDW 14.5 03/29/2024 0641   RDW 14.7 03/19/2022 1026   LYMPHSABS 1.3 07/09/2021 0930   MONOABS 0.8 07/09/2021 0930   EOSABS 0.1 07/09/2021 0930   BASOSABS 0.0 07/09/2021 0930    BMET    Component Value Date/Time   NA 134 (L) 03/29/2024 0641   NA 137 03/19/2022 1026   K 4.1 03/29/2024 0641   CL 105 03/29/2024 0641   CO2 22 03/29/2024 0641   GLUCOSE 131 (H) 03/29/2024 0641   BUN 10 03/29/2024 0641   BUN 14 03/19/2022 1026   CREATININE 0.84 03/29/2024 0641   CALCIUM  8.6 (L) 03/29/2024 0641   GFRNONAA >60 03/29/2024 0641   GFRAA 75 01/04/2019  1125    COAGS: Lab Results  Component Value Date   INR 1.2 03/27/2024   INR 1.4 (H) 12/09/2021   INR 1.9 (H) 12/09/2021     Non-Invasive Vascular Imaging:    CTA reviewed left upper extremity with no associated arterial injury and intact brachial artery with runoff in the ulnar artery to the wrist (unable to visualize radial artery in the left forearm although this does reconstitute at the wrist)   ASSESSMENT/PLAN: This is a 83 y.o. female with history of diabetes, hypertension, hyperlipidemia that vascular surgery has been consulted with concern for asymmetric pulses in the setting of left humerus fracture.  Agree that her left radial pulse is not bounding like her right upper extremity exam.  That being said she does have a palpable left ulnar pulse at the wrist.  There is no associated vascular injury on CTA in the left upper extremity in the setting of humeral fracture.  She does have a brisk multiphasic radial signal at the left wrist with a palmar arch signal.  More than adequate perfusion and asymptomatic.  Okay to proceed with orthopedic surgery.  Vascular is available as needed.  No further work-up or intervention from our standpoint at this time.  Young Hensen, MD [facility] Office: [phone]  Young Hensen

## 2024-03-29 NOTE — Progress Notes (Addendum)
 PROGRESS NOTE    [name] [name]  JYN:[id number] DOB: [birth year] DOA: 03/27/2024 PCP: Pete Brand, DO   Brief Narrative:   83 y.o. female with medical history significant of RA on methotrexate , type II DM, CAD status post CABG, brought to the emergency department for evaluation after mechanical fall.  She reports left arm and left hip pain and subsequent decreased range of motion of the left upper and lower extremities.  No paresthesias or change in sensation. She was diagnosed with zoster and started acyclovir prior to the fall.  She reports the symptoms are improving. She is not on any anticoagulants.  ED workup significant for left humeral fracture.  She was hospitalized for further management. Assessment & Plan:   Principal Problem:   Left supracondylar humerus fracture, closed, initial encounter Active Problems:   Carotid artery disease (HCC)   CAD (coronary artery disease)   Rheumatoid arthritis (HCC)   Insulin  dependent type 2 diabetes mellitus (HCC)  Left humerus fracture Management per orthopedics.  Pain control. OR tomorrow Dr. Rozelle Corning n.p.o. after midnight tonight   Herpes zoster right chest/flank area Recently diagnosed.  Started on Valtrex.  Contact precautions for now.   History of rheumatoid arthritis On methotrexate  at home which is currently on hold.   Coronary artery disease She is status post PCI and CABG.  Last PCI was in 2023.  Drug-eluting stent was placed in mid LAD at that time. Cardiac status is stable.  Aspirin  and Plavix  is on hold.  Continue with metoprolol .  Noted to be on nitrates. ARB is on hold. Elevated blood pressure readings most likely secondary to pain.  Continue to monitor for now.   Normocytic anemia stable hb 10.3   Diabetes mellitus type 2 On SSI. Prior to admission patient was on 75/25 insulin . HbA1c pending CBG (last 3)  Recent Labs    03/28/24 2135 03/29/24 0627 03/29/24 1129  GLUCAP 146* 118* 176*    Estimated body mass  index is 24.92 kg/m as calculated from the following:   Height as of this encounter: 5' 1.5" (1.562 m).   Weight as of this encounter: 60.8 kg.  DVT prophylaxis scd Code Status: full Family Communication:daughter at bed side Disposition Plan:  Status is: Inpatient Remains inpatient appropriate because: acute humerus fracture   Consultants:  ortho Procedures: none Antimicrobials:  None Subjective: Resting in bed anxious to have procedure done complains of pain with movement  Objective: Vitals:   03/29/24 0624 03/29/24 0728 03/29/24 0754 03/29/24 1442  BP: (!) 158/64  (!) 151/63 (!) 148/66  Pulse: 66 72 70 78  Resp: 19 16  16   Temp: 98.1 F (36.7 C) 98.8 F (37.1 C)  98.7 F (37.1 C)  TempSrc: Oral Oral  Oral  SpO2: 96% 97%  100%  Weight:      Height:        Intake/Output Summary (Last 24 hours) at 03/29/2024 1538 Last data filed at 03/29/2024 1300 Gross per 24 hour  Intake 480 ml  Output 800 ml  Net -320 ml   Filed Weights   03/27/24 1506 03/27/24 1552  Weight: 60.8 kg 60.8 kg    Examination:  General exam: Appears in no acute distress  respiratory system: Clear to auscultation. Respiratory effort normal. Cardiovascular system: S1 & S2 heard, RRR. No JVD, murmurs, rubs, gallops or clicks. No pedal edema. Gastrointestinal system: Abdomen is nondistended, soft and nontender. No organomegaly or masses felt. Normal bowel sounds heard. Central nervous system: Alert and oriented. No focal  neurological deficits. Extremities: Trace edema left upper extremity with decreased range of motion  Data Reviewed: I have personally reviewed following labs and imaging studies  CBC: Recent Labs  Lab 03/27/24 2259 03/28/24 1212 03/29/24 0641  WBC 7.5 7.2 7.6  HGB 10.8* 10.0* 10.3*  HCT 33.0* 29.8* 31.1*  MCV 95.1 93.1 92.8  PLT 169 171 170   Basic Metabolic Panel: Recent Labs  Lab 03/27/24 2259 03/28/24 0924 03/29/24 0641  NA 135 137 134*  K 4.1 4.0 4.1  CL 105  110 105  CO2 21* 17* 22  GLUCOSE 191* 160* 131*  BUN 13 12 10   CREATININE 0.79 0.89 0.84  CALCIUM  9.0 8.8* 8.6*  MG  --   --  1.9   GFR: Estimated Creatinine Clearance: 43 mL/min (by C-G formula based on SCr of 0.84 mg/dL). Liver Function Tests: No results for input(s): "AST", "ALT", "ALKPHOS", "BILITOT", "PROT", "ALBUMIN " in the last 168 hours. No results for input(s): "LIPASE", "AMYLASE" in the last 168 hours. No results for input(s): "AMMONIA" in the last 168 hours. Coagulation Profile: Recent Labs  Lab 03/27/24 2259  INR 1.2   Cardiac Enzymes: No results for input(s): "CKTOTAL", "CKMB", "CKMBINDEX", "TROPONINI" in the last 168 hours. BNP (last 3 results) No results for input(s): "PROBNP" in the last 8760 hours. HbA1C: No results for input(s): "HGBA1C" in the last 72 hours. CBG: Recent Labs  Lab 03/28/24 1032 03/28/24 1642 03/28/24 2135 03/29/24 0627 03/29/24 1129  GLUCAP 140* 139* 146* 118* 176*   Lipid Profile: No results for input(s): "CHOL", "HDL", "LDLCALC", "TRIG", "CHOLHDL", "LDLDIRECT" in the last 72 hours. Thyroid  Function Tests: No results for input(s): "TSH", "T4TOTAL", "FREET4", "T3FREE", "THYROIDAB" in the last 72 hours. Anemia Panel: No results for input(s): "VITAMINB12", "FOLATE", "FERRITIN", "TIBC", "IRON", "RETICCTPCT" in the last 72 hours. Sepsis Labs: No results for input(s): "PROCALCITON", "LATICACIDVEN" in the last 168 hours.  Recent Results (from the past 240 hours)  Surgical pcr screen     Status: None   Collection Time: 03/28/24 10:00 PM   Specimen: Nasal Mucosa; Nasal Swab  Result Value Ref Range Status   MRSA, PCR NEGATIVE NEGATIVE Final   Staphylococcus aureus NEGATIVE NEGATIVE Final    Comment: (NOTE) The Xpert SA Assay (FDA approved for NASAL specimens in patients 25 years of age and older), is one component of a comprehensive surveillance program. It is not intended to diagnose infection nor to guide or monitor  treatment. Performed at [facility], [address], [place], [place] [address]          Radiology Studies: DG Ankle Complete Left Result Date: 03/29/2024 CLINICAL DATA:  [id number] Ankle swelling, left [id number] EXAM: LEFT ANKLE COMPLETE - 3+ VIEW COMPARISON:  None Available. FINDINGS: No acute fracture or dislocation. No ankle mortise widening. The talar dome is intact. There is no evidence of arthropathy or other focal bone abnormality. Soft tissue swelling about the ankle. IMPRESSION: Soft tissue swelling about the ankle. Otherwise, no acute fracture or dislocation. Electronically Signed   By: Rance Burrows M.D.   On: 03/29/2024 15:22   CT Head Wo Contrast Result Date: 03/27/2024 CLINICAL DATA:  Recent fall with headaches, initial encounter EXAM: CT HEAD WITHOUT CONTRAST TECHNIQUE: Contiguous axial images were obtained from the base of the skull through the vertex without intravenous contrast. RADIATION DOSE REDUCTION: This exam was performed according to the departmental dose-optimization program which includes automated exposure control, adjustment of the mA and/or kV according to patient size and/or use  of iterative reconstruction technique. COMPARISON:  None Available. FINDINGS: Brain: No evidence of acute infarction, hemorrhage, hydrocephalus, extra-axial collection or mass lesion/mass effect. Mild atrophic changes are noted. Vascular: No hyperdense vessel or unexpected calcification. Skull: Normal. Negative for fracture or focal lesion. Sinuses/Orbits: No acute finding. Other: None. IMPRESSION: Mild atrophic changes without acute abnormality. Electronically Signed   By: Violeta Grey M.D.   On: 03/27/2024 19:35   CT Shoulder Left Wo Contrast Result Date: 03/27/2024 CLINICAL DATA:  Shoulder trauma after fall. EXAM: CT OF THE UPPER LEFT EXTREMITY WITHOUT CONTRAST TECHNIQUE: Multidetector CT imaging of the upper left extremity was performed according to the standard protocol. RADIATION  DOSE REDUCTION: This exam was performed according to the departmental dose-optimization program which includes automated exposure control, adjustment of the mA and/or kV according to patient size and/or use of iterative reconstruction technique. COMPARISON:  Same day radiographs of the left shoulder at 3:49 p.m. FINDINGS: Bones/Joint/Cartilage Acute comminuted fracture of the left humeral head with transverse fracture component at the level of the surgical neck. There is full shaft width medial displacement of the distal humeral shaft component. Comminuted fracture margins extend through the humeral head articular surface with impaction of the medial and inferior humeral head on the inferior glenoid. There is a displaced and rotated inferior humeral head articular surface fracture fragment adjacent to the inferior glenoid (series 3, image 44 and series 9, images 53-59). Fracture margins extend through the greater and lesser tuberosities. Inferior dislocation of the humeral head relative to the glenoid. There is a left glenohumeral joint effusion with lipohemarthrosis. Subchondral sclerosis and cystic changes of the glenoid and humeral head are noted. The acromioclavicular joint is anatomically aligned with mild degenerative changes. Prior median sternotomy. Degenerative changes of the lower cervical spine. Ligaments Ligaments are suboptimally evaluated by CT. Muscles and Tendons Intramuscular edema of the left shoulder musculature, surrounding the left proximal humerus fracture described above. No discrete intramuscular collection. Soft tissue Soft tissue swelling subcutaneous edema extending along the left shoulder proximal left arm. Aortic atherosclerosis. IMPRESSION: 1. Acute comminuted and displaced intra-articular fracture of the left humeral head and neck, as described above. Inferior dislocation of the humeral head relative to the glenoid. 2. Associated soft tissue and intramuscular edema of the left  shoulder. Electronically Signed   By: Mannie Seek M.D.   On: 03/27/2024 19:19   DG Hip Unilat W or Wo Pelvis 2-3 Views Left Result Date: 03/27/2024 CLINICAL DATA:  Pain after fall. EXAM: DG HIP (WITH OR WITHOUT PELVIS) 2-3V LEFT COMPARISON:  None Available. FINDINGS: No evidence of acute fracture of the pelvis or left hip. No hip dislocation. Mild bilateral hip osteoarthritis with joint space narrowing and spurring. The pubic rami are intact. Pubic symphysis and sacroiliac joints are congruent. Vascular calcifications are seen. IMPRESSION: 1. No acute fracture or subluxation of the pelvis or left hip. 2. Mild bilateral hip osteoarthritis. Electronically Signed   By: Chadwick Colonel M.D.   On: 03/27/2024 16:28   DG Shoulder Left Result Date: 03/27/2024 CLINICAL DATA:  Pain after fall.  Landed on left shoulder. EXAM: LEFT SHOULDER - 2+ VIEW COMPARISON:  None Available. FINDINGS: Comminuted displaced proximal humerus fracture. The dominant fracture plane is through the surgical neck with medial displacement of the humeral shaft. There is involvement of the lateral humeral head. The fracture extends to the inferior aspect of the glenohumeral joint with a rotational component to the humeral head. There is widening of the upper aspect of the glenohumeral joint.  No definite glenohumeral dislocation on provided views. Mild chronic acromioclavicular osteoarthritis. IMPRESSION: 1. Comminuted displaced proximal humerus fracture, fracture approaches the glenohumeral joint. 2. Widening of the upper aspect of the glenohumeral joint. No definite glenohumeral dislocation on provided views. Electronically Signed   By: Chadwick Colonel M.D.   On: 03/27/2024 16:26    Scheduled Meds:  diazepam  5 mg Oral Once   docusate sodium   100 mg Oral BID   folic acid   1 mg Oral QPM   insulin  aspart  0-5 Units Subcutaneous QHS   insulin  aspart  3 Units Subcutaneous TID WC   isosorbide  mononitrate  60 mg Oral Daily    metoprolol  tartrate  25 mg Oral BID   pantoprazole   40 mg Oral Daily   valACYclovir  1,000 mg Oral TID   Continuous Infusions:   LOS: 2 days    Time spent: 50 min  Barbee Lew, MD 03/29/2024, 3:38 PM

## 2024-03-29 NOTE — Plan of Care (Signed)
  Problem: Education: Goal: Knowledge of General Education information will improve Description: Including pain rating scale, medication(s)/side effects and non-pharmacologic comfort measures Outcome: Progressing   Problem: Health Behavior/Discharge Planning: Goal: Ability to manage health-related needs will improve Outcome: Progressing   Problem: Clinical Measurements: Goal: Ability to maintain clinical measurements within normal limits will improve Outcome: Progressing Goal: Will remain free from infection Outcome: Progressing Goal: Diagnostic test results will improve Outcome: Progressing Goal: Respiratory complications will improve Outcome: Progressing Goal: Cardiovascular complication will be avoided Outcome: Progressing   Problem: Nutrition: Goal: Adequate nutrition will be maintained Outcome: Progressing   Problem: Coping: Goal: Level of anxiety will decrease Outcome: Progressing   Problem: Elimination: Goal: Will not experience complications related to bowel motility Outcome: Progressing Goal: Will not experience complications related to urinary retention Outcome: Progressing   Problem: Pain Managment: Goal: General experience of comfort will improve and/or be controlled Outcome: Progressing   Problem: Safety: Goal: Ability to remain free from injury will improve Outcome: Progressing   Problem: Skin Integrity: Goal: Risk for impaired skin integrity will decrease Outcome: Progressing   Problem: Education: Goal: Ability to describe self-care measures that may prevent or decrease complications (Diabetes Survival Skills Education) will improve Outcome: Progressing Goal: Individualized Educational Video(s) Outcome: Progressing   Problem: Coping: Goal: Ability to adjust to condition or change in health will improve Outcome: Progressing   Problem: Fluid Volume: Goal: Ability to maintain a balanced intake and output will improve Outcome: Progressing    Problem: Health Behavior/Discharge Planning: Goal: Ability to identify and utilize available resources and services will improve Outcome: Progressing Goal: Ability to manage health-related needs will improve Outcome: Progressing   Problem: Metabolic: Goal: Ability to maintain appropriate glucose levels will improve Outcome: Progressing   Problem: Nutritional: Goal: Maintenance of adequate nutrition will improve Outcome: Progressing Goal: Progress toward achieving an optimal weight will improve Outcome: Progressing   Problem: Skin Integrity: Goal: Risk for impaired skin integrity will decrease Outcome: Progressing   Problem: Tissue Perfusion: Goal: Adequacy of tissue perfusion will improve Outcome: Progressing   Problem: Activity: Goal: Risk for activity intolerance will decrease Outcome: Not Progressing

## 2024-03-30 ENCOUNTER — Inpatient Hospital Stay (HOSPITAL_COMMUNITY)

## 2024-03-30 ENCOUNTER — Encounter (HOSPITAL_COMMUNITY): Payer: Self-pay | Admitting: Emergency Medicine

## 2024-03-30 ENCOUNTER — Encounter (HOSPITAL_COMMUNITY)
Admission: EM | Disposition: A | Discharge status: Skilled Nursing Facility | Payer: Self-pay | Source: Home / Self Care | Attending: Internal Medicine

## 2024-03-30 ENCOUNTER — Inpatient Hospital Stay (HOSPITAL_COMMUNITY): Admitting: Certified Registered Nurse Anesthetist

## 2024-03-30 ENCOUNTER — Other Ambulatory Visit: Payer: Self-pay

## 2024-03-30 DIAGNOSIS — S42292A Other displaced fracture of upper end of left humerus, initial encounter for closed fracture: Secondary | ICD-10-CM | POA: Diagnosis not present

## 2024-03-30 DIAGNOSIS — Z87891 Personal history of nicotine dependence: Secondary | ICD-10-CM | POA: Diagnosis not present

## 2024-03-30 DIAGNOSIS — S42412A Displaced simple supracondylar fracture without intercondylar fracture of left humerus, initial encounter for closed fracture: Secondary | ICD-10-CM | POA: Diagnosis not present

## 2024-03-30 DIAGNOSIS — I2510 Atherosclerotic heart disease of native coronary artery without angina pectoris: Secondary | ICD-10-CM | POA: Diagnosis not present

## 2024-03-30 DIAGNOSIS — I10 Essential (primary) hypertension: Secondary | ICD-10-CM

## 2024-03-30 DIAGNOSIS — S42202A Unspecified fracture of upper end of left humerus, initial encounter for closed fracture: Secondary | ICD-10-CM | POA: Diagnosis not present

## 2024-03-30 HISTORY — PX: REVERSE SHOULDER ARTHROPLASTY: SHX5054

## 2024-03-30 LAB — CBC
HCT: 30.7 % — ABNORMAL LOW (ref 36.0–46.0)
Hemoglobin: 10 g/dL — ABNORMAL LOW (ref 12.0–15.0)
MCH: 30.8 pg (ref 26.0–34.0)
MCHC: 32.6 g/dL (ref 30.0–36.0)
MCV: 94.5 fL (ref 80.0–100.0)
Platelets: 161 10*3/uL (ref 150–400)
RBC: 3.25 MIL/uL — ABNORMAL LOW (ref 3.87–5.11)
RDW: 14.4 % (ref 11.5–15.5)
WBC: 10.4 10*3/uL (ref 4.0–10.5)
nRBC: 0 % (ref 0.0–0.2)

## 2024-03-30 LAB — TYPE AND SCREEN
ABO/RH(D): A POS
Antibody Screen: NEGATIVE

## 2024-03-30 LAB — GLUCOSE, CAPILLARY
Glucose-Capillary: 106 mg/dL — ABNORMAL HIGH (ref 70–99)
Glucose-Capillary: 157 mg/dL — ABNORMAL HIGH (ref 70–99)
Glucose-Capillary: 178 mg/dL — ABNORMAL HIGH (ref 70–99)
Glucose-Capillary: 200 mg/dL — ABNORMAL HIGH (ref 70–99)
Glucose-Capillary: 190 mg/dL — ABNORMAL HIGH (ref 70–99)

## 2024-03-30 LAB — BASIC METABOLIC PANEL WITH GFR
Anion gap: 13 (ref 5–15)
BUN: 10 mg/dL (ref 8–23)
CO2: 19 mmol/L — ABNORMAL LOW (ref 22–32)
Calcium: 8.7 mg/dL — ABNORMAL LOW (ref 8.9–10.3)
Chloride: 107 mmol/L (ref 98–111)
Creatinine, Ser: 0.9 mg/dL (ref 0.44–1.00)
GFR, Estimated: >60 mL/min (ref >60)
Glucose, Bld: 183 mg/dL — ABNORMAL HIGH (ref 70–99)
Potassium: 3.9 mmol/L (ref 3.5–5.1)
Sodium: 139 mmol/L (ref 135–145)

## 2024-03-30 LAB — HEMOGLOBIN A1C
Hgb A1c MFr Bld: 7.2 % — ABNORMAL HIGH (ref 4.8–5.6)
Mean Plasma Glucose: 160 mg/dL

## 2024-03-30 SURGERY — ARTHROPLASTY, SHOULDER, TOTAL, REVERSE
Anesthesia: General | Site: Shoulder | Laterality: Left

## 2024-03-30 MED ORDER — CHLORHEXIDINE GLUCONATE 0.12 % MT SOLN
OROMUCOSAL | Status: AC
  Administered 2024-03-30: 15 mL via OROMUCOSAL
  Filled 2024-03-30: qty 15

## 2024-03-30 MED ORDER — LIDOCAINE 2% (20 MG/ML) 5 ML SYRINGE
INTRAMUSCULAR | Status: AC
Start: 2024-03-30 — End: ?
  Filled 2024-03-30: qty 5

## 2024-03-30 MED ORDER — ROCURONIUM BROMIDE 10 MG/ML (PF) SYRINGE
PREFILLED_SYRINGE | INTRAVENOUS | Status: DC | PRN
Start: 2024-03-30 — End: 2024-03-30
  Administered 2024-03-30: 40 mg via INTRAVENOUS

## 2024-03-30 MED ORDER — ASPIRIN 81 MG PO TBEC
81.0000 mg | DELAYED_RELEASE_TABLET | Freq: Every day | ORAL | Status: DC
  Administered 2024-03-31 – 2024-04-06 (×7): 81 mg via ORAL
  Filled 2024-03-30 (×8): qty 1

## 2024-03-30 MED ORDER — MENTHOL 3 MG MT LOZG: 1.0000 | LOZENGE | OROMUCOSAL | Status: DC | PRN

## 2024-03-30 MED ORDER — OXYCODONE HCL 5 MG PO TABS
5.0000 mg | ORAL_TABLET | ORAL | Status: DC | PRN
  Administered 2024-03-30 – 2024-03-31 (×2): 5 mg via ORAL
  Administered 2024-03-31 – 2024-04-01 (×3): 10 mg via ORAL
  Administered 2024-04-01: 5 mg via ORAL
  Administered 2024-04-02 – 2024-04-04 (×4): 10 mg via ORAL
  Filled 2024-03-30: qty 1
  Filled 2024-03-30 (×9): qty 2
  Filled 2024-03-30: qty 1

## 2024-03-30 MED ORDER — CEFAZOLIN SODIUM-DEXTROSE 2-4 GM/100ML-% IV SOLN
2.0000 g | INTRAVENOUS | Status: AC
  Administered 2024-03-30: 2 g via INTRAVENOUS

## 2024-03-30 MED ORDER — ACETAMINOPHEN 10 MG/ML IV SOLN
INTRAVENOUS | Status: DC | PRN
  Administered 2024-03-30: 1000 mg via INTRAVENOUS

## 2024-03-30 MED ORDER — LACTATED RINGERS IV SOLN: INTRAVENOUS | Status: DC

## 2024-03-30 MED ORDER — PROPOFOL 10 MG/ML IV BOLUS
INTRAVENOUS | Status: AC
  Filled 2024-03-30: qty 20

## 2024-03-30 MED ORDER — METOCLOPRAMIDE HCL 5 MG PO TABS: 5.0000 mg | ORAL_TABLET | Freq: Three times a day (TID) | ORAL | Status: DC | PRN

## 2024-03-30 MED ORDER — ACETAMINOPHEN 10 MG/ML IV SOLN
INTRAVENOUS | Status: AC
Start: 2024-03-30 — End: 2024-03-30
  Filled 2024-03-30: qty 100

## 2024-03-30 MED ORDER — CEFAZOLIN SODIUM-DEXTROSE 2-4 GM/100ML-% IV SOLN
INTRAVENOUS | Status: AC
  Filled 2024-03-30: qty 100

## 2024-03-30 MED ORDER — ALBUMIN HUMAN 5 % IV SOLN: INTRAVENOUS | Status: DC | PRN

## 2024-03-30 MED ORDER — CEFAZOLIN SODIUM-DEXTROSE 2-4 GM/100ML-% IV SOLN
2.0000 g | Freq: Three times a day (TID) | INTRAVENOUS | Status: AC
  Administered 2024-03-30 – 2024-03-31 (×3): 2 g via INTRAVENOUS
  Filled 2024-03-30 (×3): qty 100

## 2024-03-30 MED ORDER — FENTANYL CITRATE (PF) 250 MCG/5ML IJ SOLN
INTRAMUSCULAR | Status: DC | PRN
Start: 2024-03-30 — End: 2024-03-30
  Administered 2024-03-30: 50 ug via INTRAVENOUS
  Administered 2024-03-30 (×2): 25 ug via INTRAVENOUS

## 2024-03-30 MED ORDER — EPHEDRINE SULFATE-NACL 50-0.9 MG/10ML-% IV SOSY
PREFILLED_SYRINGE | INTRAVENOUS | Status: DC | PRN
  Administered 2024-03-30 (×6): 5 mg via INTRAVENOUS

## 2024-03-30 MED ORDER — PHENYLEPHRINE HCL-NACL 20-0.9 MG/250ML-% IV SOLN
INTRAVENOUS | Status: DC | PRN
  Administered 2024-03-30: 30 ug/min via INTRAVENOUS

## 2024-03-30 MED ORDER — ESMOLOL HCL 100 MG/10ML IV SOLN
INTRAVENOUS | Status: DC | PRN
  Administered 2024-03-30 (×3): 10 mg via INTRAVENOUS

## 2024-03-30 MED ORDER — FENTANYL CITRATE (PF) 250 MCG/5ML IJ SOLN
INTRAMUSCULAR | Status: AC
  Filled 2024-03-30: qty 5

## 2024-03-30 MED ORDER — 0.9 % SODIUM CHLORIDE (POUR BTL) OPTIME
TOPICAL | Status: DC | PRN
  Administered 2024-03-30: 1000 mL
  Administered 2024-03-30: 3000 mL

## 2024-03-30 MED ORDER — PHENYLEPHRINE 80 MCG/ML (10ML) SYRINGE FOR IV PUSH (FOR BLOOD PRESSURE SUPPORT)
PREFILLED_SYRINGE | INTRAVENOUS | Status: DC | PRN
  Administered 2024-03-30: 160 ug via INTRAVENOUS
  Administered 2024-03-30 (×2): 80 ug via INTRAVENOUS
  Administered 2024-03-30: 40 ug via INTRAVENOUS
  Administered 2024-03-30: 80 ug via INTRAVENOUS
  Administered 2024-03-30: 40 ug via INTRAVENOUS

## 2024-03-30 MED ORDER — PHENYLEPHRINE 80 MCG/ML (10ML) SYRINGE FOR IV PUSH (FOR BLOOD PRESSURE SUPPORT)
PREFILLED_SYRINGE | INTRAVENOUS | Status: AC
  Filled 2024-03-30: qty 10

## 2024-03-30 MED ORDER — IRRISEPT - 450ML BOTTLE WITH 0.05% CHG IN STERILE WATER, USP 99.95% OPTIME
TOPICAL | Status: DC | PRN
  Administered 2024-03-30: 450 mL via TOPICAL

## 2024-03-30 MED ORDER — METHOCARBAMOL 500 MG PO TABS
500.0000 mg | ORAL_TABLET | Freq: Four times a day (QID) | ORAL | Status: DC | PRN
  Administered 2024-03-30: 500 mg via ORAL
  Filled 2024-03-30: qty 1

## 2024-03-30 MED ORDER — CHLORHEXIDINE GLUCONATE 0.12 % MT SOLN: 15.0000 mL | Freq: Once | OROMUCOSAL | Status: AC

## 2024-03-30 MED ORDER — PHENOL 1.4 % MT LIQD: 1.0000 | OROMUCOSAL | Status: DC | PRN

## 2024-03-30 MED ORDER — CHLORHEXIDINE GLUCONATE 4 % EX SOLN: 60.0000 mL | Freq: Once | CUTANEOUS | Status: DC

## 2024-03-30 MED ORDER — SUGAMMADEX SODIUM 200 MG/2ML IV SOLN
INTRAVENOUS | Status: DC | PRN
  Administered 2024-03-30: 100 mg via INTRAVENOUS

## 2024-03-30 MED ORDER — VANCOMYCIN HCL 1000 MG IV SOLR
INTRAVENOUS | Status: DC | PRN
Start: 2024-03-30 — End: 2024-03-30
  Administered 2024-03-30: 1000 mg via TOPICAL

## 2024-03-30 MED ORDER — POVIDONE-IODINE 10 % EX SWAB
2.0000 | Freq: Once | CUTANEOUS | Status: AC
  Administered 2024-03-30: 2 via TOPICAL

## 2024-03-30 MED ORDER — ACETAMINOPHEN 500 MG PO TABS: 1000.0000 mg | ORAL_TABLET | Freq: Once | ORAL | Status: DC

## 2024-03-30 MED ORDER — CLOPIDOGREL BISULFATE 75 MG PO TABS
75.0000 mg | ORAL_TABLET | Freq: Every day | ORAL | Status: DC
  Administered 2024-03-31 – 2024-04-06 (×7): 75 mg via ORAL
  Filled 2024-03-30 (×7): qty 1

## 2024-03-30 MED ORDER — ACETAMINOPHEN 325 MG PO TABS: 325.0000 mg | ORAL_TABLET | Freq: Four times a day (QID) | ORAL | Status: DC | PRN

## 2024-03-30 MED ORDER — METOCLOPRAMIDE HCL 5 MG/ML IJ SOLN: 5.0000 mg | Freq: Three times a day (TID) | INTRAMUSCULAR | Status: DC | PRN

## 2024-03-30 MED ORDER — DEXAMETHASONE SODIUM PHOSPHATE 10 MG/ML IJ SOLN
INTRAMUSCULAR | Status: AC
  Filled 2024-03-30: qty 1

## 2024-03-30 MED ORDER — DEXAMETHASONE SODIUM PHOSPHATE 10 MG/ML IJ SOLN
INTRAMUSCULAR | Status: DC | PRN
  Administered 2024-03-30: 5 mg via INTRAVENOUS

## 2024-03-30 MED ORDER — ONDANSETRON HCL 4 MG/2ML IJ SOLN
INTRAMUSCULAR | Status: AC
  Filled 2024-03-30: qty 2

## 2024-03-30 MED ORDER — VANCOMYCIN HCL 1000 MG IV SOLR
INTRAVENOUS | Status: AC
Start: 2024-03-30 — End: ?
  Filled 2024-03-30: qty 20

## 2024-03-30 MED ORDER — ONDANSETRON HCL 4 MG PO TABS
4.0000 mg | ORAL_TABLET | Freq: Four times a day (QID) | ORAL | Status: DC | PRN
Start: 2024-03-30 — End: 2024-04-06

## 2024-03-30 MED ORDER — ONDANSETRON HCL 4 MG/2ML IJ SOLN
INTRAMUSCULAR | Status: DC | PRN
  Administered 2024-03-30: 4 mg via INTRAVENOUS

## 2024-03-30 MED ORDER — ONDANSETRON HCL 4 MG/2ML IJ SOLN: 4.0000 mg | Freq: Four times a day (QID) | INTRAMUSCULAR | Status: DC | PRN

## 2024-03-30 MED ORDER — TRANEXAMIC ACID-NACL 1000-0.7 MG/100ML-% IV SOLN
1000.0000 mg | INTRAVENOUS | Status: AC
  Administered 2024-03-30: 1000 mg via INTRAVENOUS

## 2024-03-30 MED ORDER — ACETAMINOPHEN 500 MG PO TABS
ORAL_TABLET | ORAL | Status: AC
Start: 2024-03-30 — End: 2024-03-30
  Filled 2024-03-30: qty 2

## 2024-03-30 MED ORDER — PROPOFOL 10 MG/ML IV BOLUS
INTRAVENOUS | Status: DC | PRN
  Administered 2024-03-30: 30 mg via INTRAVENOUS
  Administered 2024-03-30: 80 mg via INTRAVENOUS
  Administered 2024-03-30 (×2): 20 mg via INTRAVENOUS

## 2024-03-30 MED ORDER — AMISULPRIDE (ANTIEMETIC) 5 MG/2ML IV SOLN: 10.0000 mg | Freq: Once | INTRAVENOUS | Status: DC | PRN

## 2024-03-30 MED ORDER — ACETAMINOPHEN 500 MG PO TABS
1000.0000 mg | ORAL_TABLET | Freq: Four times a day (QID) | ORAL | Status: AC
  Administered 2024-03-30 – 2024-03-31 (×3): 1000 mg via ORAL
  Filled 2024-03-30 (×3): qty 2

## 2024-03-30 MED ORDER — METHOCARBAMOL 1000 MG/10ML IJ SOLN: 500.0000 mg | Freq: Four times a day (QID) | INTRAMUSCULAR | Status: DC | PRN

## 2024-03-30 MED ORDER — ROCURONIUM BROMIDE 10 MG/ML (PF) SYRINGE
PREFILLED_SYRINGE | INTRAVENOUS | Status: AC
  Filled 2024-03-30: qty 10

## 2024-03-30 MED ORDER — ORAL CARE MOUTH RINSE: 15.0000 mL | Freq: Once | OROMUCOSAL | Status: AC

## 2024-03-30 MED ORDER — DOCUSATE SODIUM 100 MG PO CAPS: 100.0000 mg | ORAL_CAPSULE | Freq: Two times a day (BID) | ORAL | Status: DC

## 2024-03-30 MED ORDER — FENTANYL CITRATE (PF) 100 MCG/2ML IJ SOLN: 25.0000 ug | INTRAMUSCULAR | Status: DC | PRN

## 2024-03-30 MED ORDER — TRANEXAMIC ACID-NACL 1000-0.7 MG/100ML-% IV SOLN
INTRAVENOUS | Status: AC
  Filled 2024-03-30: qty 100

## 2024-03-30 SURGICAL SUPPLY — 64 items
ALCOHOL 70% 16 OZ (MISCELLANEOUS) ×1 IMPLANT
BAG COUNTER SPONGE SURGICOUNT (BAG) ×1 IMPLANT
BASEPLATE AUG MED W-TAPER (Plate) IMPLANT
BEARING HUMERAL SHLDER 36M STD (Shoulder) IMPLANT
BIT DRILL 2.7 W/STOP DISP (BIT) IMPLANT
BIT DRILL TWIST 2.7 (BIT) IMPLANT
BLADE SAW SGTL 13X75X1.27 (BLADE) ×1 IMPLANT
CATH FOLEY 2WAY SLVR 5CC 16FR (CATHETERS) IMPLANT
CHLORAPREP W/TINT 26 (MISCELLANEOUS) ×1 IMPLANT
CLSR STERI-STRIP ANTIMIC 1/2X4 (GAUZE/BANDAGES/DRESSINGS) IMPLANT
COOLER ICEMAN CLASSIC (MISCELLANEOUS) ×1 IMPLANT
COVER SURGICAL LIGHT HANDLE (MISCELLANEOUS) ×1 IMPLANT
DRAPE INCISE IOBAN 66X45 STRL (DRAPES) ×1 IMPLANT
DRAPE U-SHAPE 47X51 STRL (DRAPES) ×2 IMPLANT
DRESSING AQUACEL AG SP 3.5X10 (GAUZE/BANDAGES/DRESSINGS) IMPLANT
DRSG AQUACEL AG ADV 3.5X10 (GAUZE/BANDAGES/DRESSINGS) ×1 IMPLANT
ELECTRODE BLDE 4.0 EZ CLN MEGD (MISCELLANEOUS) ×1 IMPLANT
ELECTRODE REM PT RTRN 9FT ADLT (ELECTROSURGICAL) ×1 IMPLANT
GAUZE SPONGE 4X4 12PLY STRL (GAUZE/BANDAGES/DRESSINGS) IMPLANT
GAUZE SPONGE 4X4 12PLY STRL LF (GAUZE/BANDAGES/DRESSINGS) ×1 IMPLANT
GLENOID SPHERE STD STRL 36MM (Orthopedic Implant) IMPLANT
GLOVE BIOGEL PI IND STRL 6.5 (GLOVE) ×1 IMPLANT
GLOVE BIOGEL PI IND STRL 8 (GLOVE) ×1 IMPLANT
GLOVE ECLIPSE 6.5 STRL STRAW (GLOVE) ×1 IMPLANT
GLOVE ECLIPSE 8.0 STRL XLNG CF (GLOVE) ×1 IMPLANT
GOWN STRL REUS W/ TWL LRG LVL3 (GOWN DISPOSABLE) ×1 IMPLANT
GOWN STRL REUS W/ TWL XL LVL3 (GOWN DISPOSABLE) ×1 IMPLANT
HYDROGEN PEROXIDE 16OZ (MISCELLANEOUS) ×1 IMPLANT
KIT BASIN OR (CUSTOM PROCEDURE TRAY) ×1 IMPLANT
KIT TURNOVER KIT B (KITS) ×1 IMPLANT
LAVAGE JET IRRISEPT WOUND (IRRIGATION / IRRIGATOR) ×1 IMPLANT
MANIFOLD NEPTUNE II (INSTRUMENTS) ×1 IMPLANT
NDL SUT 6 .5 CRC .975X.05 MAYO (NEEDLE) IMPLANT
NDL TAPERED W/ NITINOL LOOP (MISCELLANEOUS) IMPLANT
NEEDLE TAPERED W/ NITINOL LOOP (MISCELLANEOUS) ×1 IMPLANT
NS IRRIG 1000ML POUR BTL (IV SOLUTION) ×1 IMPLANT
PACK SHOULDER (CUSTOM PROCEDURE TRAY) ×1 IMPLANT
PAD COLD SHLDR WRAP-ON (PAD) ×1 IMPLANT
PIN THREADED REVERSE (PIN) IMPLANT
REAMER GUIDE BUSHING SURG DISP (MISCELLANEOUS) IMPLANT
REAMER GUIDE W/SCREW AUG (MISCELLANEOUS) IMPLANT
RESTRAINT HEAD UNIVERSAL NS (MISCELLANEOUS) ×1 IMPLANT
RETRIEVER SUT HEWSON (MISCELLANEOUS) ×1 IMPLANT
SCREW BONE LOCKING 4.75X30X3.5 (Screw) IMPLANT
SCREW BONE STRL 6.5MMX25MM (Screw) IMPLANT
SCREW LOCKING 4.75MMX15MM (Screw) IMPLANT
SCREW LOCKING STRL 4.75X25X3.5 (Screw) IMPLANT
SLING ARM IMMOBILIZER LRG (SOFTGOODS) ×1 IMPLANT
SOL PREP POV-IOD 4OZ 10% (MISCELLANEOUS) ×1 IMPLANT
SPONGE T-LAP 18X18 ~~LOC~~+RFID (SPONGE) ×1 IMPLANT
STEM HUMERAL STRL 10MMX122MM (Stem) IMPLANT
STRIP CLOSURE SKIN 1/2X4 (GAUZE/BANDAGES/DRESSINGS) ×1 IMPLANT
SUCTION TUBE FRAZIER 10FR DISP (SUCTIONS) ×1 IMPLANT
SUT BROADBAND TAPE 2PK 1.5 (SUTURE) IMPLANT
SUT MAXBRAID (SUTURE) IMPLANT
SUT MNCRL AB 3-0 PS2 18 (SUTURE) ×1 IMPLANT
SUT SILK 2 0 TIES 10X30 (SUTURE) ×1 IMPLANT
SUT VIC AB 0 CT1 27XBRD ANBCTR (SUTURE) ×4 IMPLANT
SUT VIC AB 1 CT1 27XBRD ANBCTR (SUTURE) ×2 IMPLANT
SUT VIC AB 2-0 CT2 27 (SUTURE) ×3 IMPLANT
SUT VICRYL 0 UR6 27IN ABS (SUTURE) ×2 IMPLANT
TOWEL GREEN STERILE (TOWEL DISPOSABLE) ×1 IMPLANT
TRAY HUM REV SHOULDER STD +6 (Shoulder) IMPLANT
WATER STERILE IRR 1000ML POUR (IV SOLUTION) ×1 IMPLANT

## 2024-03-30 NOTE — Brief Op Note (Signed)
   03/30/2024  11:31 AM  PATIENT:  Tyrus Gallus  83 y.o. female  PRE-OPERATIVE DIAGNOSIS:  Left Proximal Humerus Fracture  POST-OPERATIVE DIAGNOSIS:  Left Proximal Humerus Fracture  PROCEDURE:  Procedure(s): ARTHROPLASTY, SHOULDER, TOTAL, REVERSE  SURGEON:  Surgeon(s): Jasmine Mesi, MD  ASSISTANT: magnant pa  ANESTHESIA:   general  EBL: 100 ml    Total I/O In: 1150 [I.V.:600; IV Piggyback:550] Out: 1100 [Urine:1000; Blood:100]  BLOOD ADMINISTERED: none  DRAINS: none   LOCAL MEDICATIONS USED:  vanco  SPECIMEN:  No Specimen  COUNTS:  YES  TOURNIQUET:  * No tourniquets in log *  DICTATION: .Other Dictation: Dictation Number [id number]  PLAN OF CARE: Admit to inpatient   PATIENT DISPOSITION:  PACU - hemodynamically stable

## 2024-03-30 NOTE — Progress Notes (Signed)
 Patient stable.  Has adequate perfusion to the left upper extremity per vascular consult.  Appreciate their input.  Plan at this time is for shoulder replacement which would be reverse replacement versus hemiarthroplasty really depending on the status of the glenoid.  All questions answered.

## 2024-03-30 NOTE — Transfer of Care (Signed)
 Immediate Anesthesia Transfer of Care Note  Patient: [name] [name]  Procedure(s) Performed: ARTHROPLASTY, SHOULDER, TOTAL, REVERSE (Left: Shoulder)  Patient Location: PACU  Anesthesia Type:GA combined with regional for post-op pain  Level of Consciousness: awake, alert , and patient cooperative  Airway & Oxygen Therapy: Patient Spontanous Breathing and Patient connected to face mask oxygen  Post-op Assessment: Report given to RN and Post -op Vital signs reviewed and stable  Post vital signs: Reviewed and stable  Last Vitals:  Vitals Value Taken Time  BP 142/67 03/30/24 1130  Temp 37.2 C 03/30/24 1130  Pulse 98 03/30/24 1137  Resp 23 03/30/24 1137  SpO2 96 % 03/30/24 1137  Vitals shown include unfiled device data.  Last Pain:  Vitals:   03/30/24 0337  TempSrc: (P) Oral  PainSc:       Patients Stated Pain Goal: 0 (03/29/24 0837)  Complications: No notable events documented.

## 2024-03-30 NOTE — Anesthesia Procedure Notes (Signed)
 Procedure Name: Intubation Date/Time: 03/30/2024 7:57 AM  Performed by: Dawna Etienne, CRNAPre-anesthesia Checklist: Patient identified, Patient being monitored, Timeout performed, Emergency Drugs available and Suction available Patient Re-evaluated:Patient Re-evaluated prior to induction Oxygen Delivery Method: Circle System Utilized Preoxygenation: Pre-oxygenation with 100% oxygen Induction Type: IV induction Ventilation: Mask ventilation without difficulty Laryngoscope Size: Miller and 2 Grade View: Grade I Tube type: Oral Tube size: 7.0 mm Number of attempts: 1 Airway Equipment and Method: Stylet Placement Confirmation: ETT inserted through vocal cords under direct vision, positive ETCO2 and breath sounds checked- equal and bilateral Secured at: 21 cm Tube secured with: Tape Dental Injury: Teeth and Oropharynx as per pre-operative assessment

## 2024-03-30 NOTE — Progress Notes (Signed)
 The patient has remained NPO since midnight in preparation for the scheduled procedure. MRSA nasal swab and CHG (chlorhexidine  gluconate) skin prep were completed per protocol. The patient demonstrated understanding of the procedure and provided informed consent by signing the consent form.

## 2024-03-30 NOTE — Anesthesia Procedure Notes (Signed)
 Anesthesia Regional Block: Interscalene brachial plexus block   Pre-Anesthetic Checklist: , timeout performed,  Correct Patient, Correct Site, Correct Laterality,  Correct Procedure, Correct Position, site marked,  Risks and benefits discussed,  Surgical consent,  Pre-op evaluation,  At surgeon's request and post-op pain management  Laterality: Left  Prep: chloraprep       Needles:  Injection technique: Single-shot  Needle Type: Echogenic Needle     Needle Length: 9cm  Needle Gauge: 21     Additional Needles:   Procedures:,,,, ultrasound used (permanent image in chart),,    Narrative:  Start time: 03/30/2024 7:18 AM End time: 03/30/2024 7:23 AM Injection made incrementally with aspirations every 5 mL.  Performed by: Personally  Anesthesiologist: Peggy Bowens, MD

## 2024-03-30 NOTE — Anesthesia Preprocedure Evaluation (Signed)
 Anesthesia Evaluation  Patient identified by MRN, date of birth, ID band Patient awake    Reviewed: Allergy & Precautions, NPO status , Patient's Chart, lab work & pertinent test results  Airway Mallampati: II  TM Distance: >3 FB Neck ROM: Full    Dental  (+) Dental Advisory Given   Pulmonary former smoker   breath sounds clear to auscultation       Cardiovascular hypertension, Pt. on medications and Pt. on home beta blockers + CAD, + Cardiac Stents and + CABG   Rhythm:Regular Rate:Normal     Neuro/Psych negative neurological ROS     GI/Hepatic negative GI ROS, Neg liver ROS,,,  Endo/Other  diabetes, Type 2    Renal/GU negative Renal ROS     Musculoskeletal  (+) Arthritis ,    Abdominal   Peds  Hematology  (+) Blood dyscrasia, anemia   Anesthesia Other Findings   Reproductive/Obstetrics                             Anesthesia Physical Anesthesia Plan  ASA: 3  Anesthesia Plan: General   Post-op Pain Management: Ofirmev  IV (intra-op)* and Regional block*   Induction: Intravenous  PONV Risk Score and Plan: 3 and Dexamethasone, Ondansetron  and Treatment may vary due to age or medical condition  Airway Management Planned: Oral ETT  Additional Equipment:   Intra-op Plan:   Post-operative Plan: Extubation in OR  Informed Consent: I have reviewed the patients History and Physical, chart, labs and discussed the procedure including the risks, benefits and alternatives for the proposed anesthesia with the patient or authorized representative who has indicated his/her understanding and acceptance.     Dental advisory given  Plan Discussed with: CRNA  Anesthesia Plan Comments:        Anesthesia Quick Evaluation

## 2024-03-30 NOTE — Progress Notes (Signed)
 PROGRESS NOTE    [name] [name]  VHQ:[id number] DOB: [birth year] DOA: 03/27/2024 PCP: Pete Brand, DO   Brief Narrative:   83 y.o. female with medical history significant of RA on methotrexate , type II DM, CAD status post CABG, brought to the emergency department for evaluation after mechanical fall.  She reports left arm and left hip pain and subsequent decreased range of motion of the left upper and lower extremities.  No paresthesias or change in sensation. She was diagnosed with zoster and started acyclovir prior to the fall.  She reports the symptoms are improving. She is not on any anticoagulants.  ED workup significant for left humeral fracture.  She was hospitalized for further management. Assessment & Plan:   Principal Problem:   Left supracondylar humerus fracture, closed, initial encounter Active Problems:   Carotid artery disease (HCC)   CAD (coronary artery disease)   Rheumatoid arthritis (HCC)   Insulin  dependent type 2 diabetes mellitus (HCC)  Left humerus fracture-status post left shoulder reverse arthroplasty Dr. Rozelle Corning Mar 30, 2024.  Ortho had consulted vascular due to asymmetric radial pulses on the left compared to the right.  Patient with history of prior CABG and radial A-line.  A CT angiogram of the left upper extremity was done-  No evidence of acute vascular injury at the level of the known left humeral head and neck fracture.Nonvisualization of the radial artery from the proximal forearm through the wrist, of uncertain acuity. The origin of the radial artery opacifies normally, and there is reconstitution of the radial artery at the level of the wrist.Diminutive but patent radial artery is visualized throughout its course.   Nonvascular:  Comminuted left humeral head and neck fracture not appreciably changed since prior study. Severe inferior subluxation of the humeral head within the glenoid fossa. Persistent soft tissue edema surrounding the left shoulder  and extending into the proximal upper arm. No fluid collection or hematoma.  Herpes zoster right chest/flank area Recently diagnosed.  Started on Valtrex.  Contact precautions for now.   History of rheumatoid arthritis On methotrexate  at home which is currently on hold.   Coronary artery disease She is status post PCI and CABG.  Last PCI was in 2023.  Drug-eluting stent was placed in mid LAD at that time. Cardiac status is stable.  Aspirin  and Plavix  is on hold.  Continue with metoprolol .  Noted to be on nitrates. ARB is on hold. Elevated blood pressure readings most likely secondary to pain.  Continue to monitor for now.   Normocytic anemia stable hb 10.3   Diabetes mellitus type 2 On SSI. Prior to admission patient was on 75/25 insulin . HbA1c  still pending CBG (last 3)  Recent Labs    03/30/24 0558 03/30/24 1132 03/30/24 1353  GLUCAP 106* 157* 178*    Estimated body mass index is 24.92 kg/m as calculated from the following:   Height as of this encounter: 5' 1.5" (1.562 m).   Weight as of this encounter: 60.8 kg.  DVT prophylaxis scd Code Status: full Family Communication:daughter at bed side Disposition Plan:  Status is: Inpatient Remains inpatient appropriate because: acute humerus fracture   Consultants:  ortho Procedures: none Antimicrobials:  None Subjective:  Seen after the procedure Daughter at bed side Little drowsy  Objective: Vitals:   03/30/24 1200 03/30/24 1215 03/30/24 1230 03/30/24 1250  BP: (!) 143/52 (!) 149/56 (!) 139/54 (!) (P) 144/63  Pulse: 78 82 77 (P) 74  Resp: 18 20 16  (P) 16  Temp:  98.6 F (37 C) (P) 98.1 F (36.7 C)  TempSrc:    (P) Oral  SpO2: 97% 99% 99% (P) 99%  Weight:      Height:        Intake/Output Summary (Last 24 hours) at 03/30/2024 1441 Last data filed at 03/30/2024 1230 Gross per 24 hour  Intake 1610 ml  Output 1775 ml  Net -165 ml   Filed Weights   03/27/24 1506 03/27/24 1552  Weight: 60.8 kg 60.8 kg     Examination:  General exam: Appears in no acute distress  respiratory system: Clear to auscultation. Respiratory effort normal. Cardiovascular system: S1 & S2 heard, RRR. No JVD, murmurs, rubs, gallops or clicks. No pedal edema. Gastrointestinal system: Abdomen is nondistended, soft and nontender. No organomegaly or masses felt. Normal bowel sounds heard. Central nervous system: Alert and oriented. No focal neurological deficits. Extremities: Trace edema left upper extremity with decreased range of motion  Data Reviewed: I have personally reviewed following labs and imaging studies  CBC: Recent Labs  Lab 03/27/24 2259 03/28/24 1212 03/29/24 0641 03/30/24 1259  WBC 7.5 7.2 7.6 10.4  HGB 10.8* 10.0* 10.3* 10.0*  HCT 33.0* 29.8* 31.1* 30.7*  MCV 95.1 93.1 92.8 94.5  PLT 169 171 170 161   Basic Metabolic Panel: Recent Labs  Lab 03/27/24 2259 03/28/24 0924 03/29/24 0641 03/30/24 1259  NA 135 137 134* 139  K 4.1 4.0 4.1 3.9  CL 105 110 105 107  CO2 21* 17* 22 19*  GLUCOSE 191* 160* 131* 183*  BUN 13 12 10 10   CREATININE 0.79 0.89 0.84 0.90  CALCIUM  9.0 8.8* 8.6* 8.7*  MG  --   --  1.9  --    GFR: Estimated Creatinine Clearance: 40.2 mL/min (by C-G formula based on SCr of 0.9 mg/dL). Liver Function Tests: No results for input(s): "AST", "ALT", "ALKPHOS", "BILITOT", "PROT", "ALBUMIN " in the last 168 hours. No results for input(s): "LIPASE", "AMYLASE" in the last 168 hours. No results for input(s): "AMMONIA" in the last 168 hours. Coagulation Profile: Recent Labs  Lab 03/27/24 2259  INR 1.2   Cardiac Enzymes: No results for input(s): "CKTOTAL", "CKMB", "CKMBINDEX", "TROPONINI" in the last 168 hours. BNP (last 3 results) No results for input(s): "PROBNP" in the last 8760 hours. HbA1C: Recent Labs    03/29/24 0641  HGBA1C 7.2*   CBG: Recent Labs  Lab 03/29/24 1615 03/29/24 2137 03/30/24 0558 03/30/24 1132 03/30/24 1353  GLUCAP 111* 111* 106* 157* 178*    Lipid Profile: No results for input(s): "CHOL", "HDL", "LDLCALC", "TRIG", "CHOLHDL", "LDLDIRECT" in the last 72 hours. Thyroid  Function Tests: No results for input(s): "TSH", "T4TOTAL", "FREET4", "T3FREE", "THYROIDAB" in the last 72 hours. Anemia Panel: No results for input(s): "VITAMINB12", "FOLATE", "FERRITIN", "TIBC", "IRON", "RETICCTPCT" in the last 72 hours. Sepsis Labs: No results for input(s): "PROCALCITON", "LATICACIDVEN" in the last 168 hours.  Recent Results (from the past 240 hours)  Surgical pcr screen     Status: None   Collection Time: 03/28/24 10:00 PM   Specimen: Nasal Mucosa; Nasal Swab  Result Value Ref Range Status   MRSA, PCR NEGATIVE NEGATIVE Final   Staphylococcus aureus NEGATIVE NEGATIVE Final    Comment: (NOTE) The Xpert SA Assay (FDA approved for NASAL specimens in patients 81 years of age and older), is one component of a comprehensive surveillance program. It is not intended to diagnose infection nor to guide or monitor treatment. Performed at [facility], [address], [place],  [place] [address]          Radiology Studies: DG Shoulder Left Port Result Date: 03/30/2024 CLINICAL DATA:  Status post shoulder surgery. EXAM: LEFT SHOULDER COMPARISON:  Preoperative imaging FINDINGS: Reverse left shoulder arthroplasty in expected alignment. No periprosthetic lucency or fracture. Recent postsurgical change includes air and edema in the joint space and soft tissues. IMPRESSION: Reverse left shoulder arthroplasty without immediate postoperative complication. Electronically Signed   By: Chadwick Colonel M.D.   On: 03/30/2024 12:51   DG Knee Left Port Result Date: 03/29/2024 CLINICAL DATA:  Left knee effusion EXAM: PORTABLE LEFT KNEE - 1-2 VIEW COMPARISON:  06/03/2014 FINDINGS: Frontal and lateral views of the left knee demonstrates severe 3 compartmental osteoarthritis with near complete loss of joint space within the medial and lateral compartments.  This has progressed since prior exam. Small suprapatellar joint effusion. No acute displaced fracture, subluxation, or dislocation. Diffuse atherosclerosis. IMPRESSION: 1. Severe progressive 3 compartmental osteoarthritis greatest in the medial and lateral compartments. 2. Small suprapatellar joint effusion. 3. No acute fracture. Electronically Signed   By: Bobbye Burrow M.D.   On: 03/29/2024 21:49   CT ANGIO UP EXTREM LEFT W &/OR WO CONTAST Result Date: 03/29/2024 CLINICAL DATA:  Comminuted left humeral head and neck fracture, diminished pulses left upper extremity EXAM: CT ANGIOGRAPHY OF THE UPPER LEFT EXTREMITY WITH CONTRAST TECHNIQUE: Multidetector CT imaging of the upper left extremity was performed according to the standard protocol following intravenous contrast administration. RADIATION DOSE REDUCTION: This exam was performed according to the departmental dose-optimization program which includes automated exposure control, adjustment of the mA and/or kV according to patient size and/or use of iterative reconstruction technique. CONTRAST:  75mL OMNIPAQUE  IOHEXOL  350 MG/ML SOLN COMPARISON:  03/27/2024 FINDINGS: Bones/Joint/Cartilage The comminuted impacted left humeral head and neck fracture seen on prior study is again identified. Since the previous exam, there has been no appreciable change in the orientation of the fracture fragments, with impaction and medial displacement of the distal humerus relative to the humeral head and neck again noted. Severe inferior subluxation of the humeral head relative to the glenoid again noted. No other acute bony abnormalities. Ligaments Suboptimally assessed by CT. Muscles and Tendons Continued intramuscular edema surrounding the left shoulder fracture. Soft tissues There is diffuse subcutaneous edema within the left supraclavicular region, surrounding the left shoulder, and extending into the left upper arm. No fluid collection or hematoma. Visualized portions of the  left chest demonstrates left lower lobe bronchiectasis and scarring. No acute pleural or parenchymal lung disease. Visualized mediastinal structures are grossly unremarkable. Limited imaging of the intraperitoneal structures demonstrates a large cyst off the lower pole left kidney, for which no specific imaging follow-up is required. Vascular Visualized portions of the thoracic aorta are unremarkable. Great vessels are widely patent at their origin. The left subclavian, axillary, and brachial arteries are widely patent. Evaluation of the arterial structures distal to the elbow is somewhat limited due to timing of contrast bolus. The radial artery is seen at its origin, but is not well visualized within the mid to distal forearm. There is distal reconstitution at the level of the wrist. The ulnar artery is patent throughout its course, but somewhat diminutive. A diminutive interosseous artery is visualized to the level of the distal forearm. Reconstructed images demonstrate no additional findings. IMPRESSION: Vascular: 1. No evidence of acute vascular injury at the level of the known left humeral head and neck fracture. 2. Nonvisualization of the radial artery from the proximal forearm through the  wrist, of uncertain acuity. The origin of the radial artery opacifies normally, and there is reconstitution of the radial artery at the level of the wrist. 3. Diminutive but patent radial artery is visualized throughout its course. Nonvascular: 1. Comminuted left humeral head and neck fracture not appreciably changed since prior study. Severe inferior subluxation of the humeral head within the glenoid fossa. 2. Persistent soft tissue edema surrounding the left shoulder and extending into the proximal upper arm. No fluid collection or hematoma. Electronically Signed   By: Bobbye Burrow M.D.   On: 03/29/2024 19:01   MR HIP LEFT WO CONTRAST Result Date: 03/29/2024 CLINICAL DATA:  Hip trauma.  Fracture suspected. EXAM: MR  OF THE LEFT HIP WITHOUT CONTRAST TECHNIQUE: Multiplanar, multisequence MR imaging was performed. No intravenous contrast was administered. COMPARISON:  Pelvis and left hip radiographs 03/27/2024, CT abdomen and pelvis 01/28/2016, CT abdomen pelvis 06/03/2014 FINDINGS: Bones: There is curvilinear decreased T1 and increased T2 with high-grade surrounding marrow edema indicating an acute to subacute nondisplaced fracture of the left pubic body (coronal series 3, image 27, axial series 5, image 20). Additional acute nondisplaced fracture with high-grade surrounding marrow edema of the mid AP dimension of the left inferior pubic ramus (axial series 5, image 25). Additional a vertical oriented linear decreased T1 increased T2 signal indicating an acute to subacute nondisplaced fracture of the medial aspect of the left iliac bone near the left sacroiliac joint (coronal series 3 and 4 images 8 through 11). Articular cartilage and labrum Left hip: Articular cartilage: Moderate to high-grade anterior superior left femoral head and acetabular cartilage thinning. No left hip joint effusion. Labrum: Mild attenuation and degenerative irregularity of the anterior superior left acetabular labrum. Joint or bursal effusion Joint effusion: Tiny left hip joint effusion. No right hip joint effusion. Bursae: No trochanteric bursitis on either side. Muscles and tendons Muscles and tendons: Moderate edema throughout the left adductor muscles including the obturator externus and adductor longus, brevis, and magnus muscles originating off of the left pubic body and inferior left pubic ramus. Mild edema within the right groin musculature originating off of the right pubic body and inferior right pubic ramus. Other findings Miscellaneous: Mild presacral fluid. The uterus appears surgically absent. IMPRESSION: 1. Acute to subacute nondisplaced fractures of the left pubic body and left inferior pubic ramus. 2. Acute to subacute nondisplaced  vertically oriented fracture of the medial aspect of the left iliac bone near the left sacroiliac joint. This does not appear to extend into the sacroiliac joint. 3. Moderate edema throughout the left adductor muscles originating off of the left pubic body and inferior left pubic ramus. Mild edema within the contralateral right groin musculature. 4. Moderate to high-grade anterior superior left femoral head and acetabular cartilage thinning. Electronically Signed   By: Bertina Broccoli M.D.   On: 03/29/2024 18:02   DG Ankle Complete Left Result Date: 03/29/2024 CLINICAL DATA:  [id number] Ankle swelling, left [id number] EXAM: LEFT ANKLE COMPLETE - 3+ VIEW COMPARISON:  None Available. FINDINGS: No acute fracture or dislocation. No ankle mortise widening. The talar dome is intact. There is no evidence of arthropathy or other focal bone abnormality. Soft tissue swelling about the ankle. IMPRESSION: Soft tissue swelling about the ankle. Otherwise, no acute fracture or dislocation. Electronically Signed   By: Rance Burrows M.D.   On: 03/29/2024 15:22    Scheduled Meds:  [START ON 03/31/2024] clopidogrel   75 mg Oral Q breakfast   docusate sodium   100 mg Oral BID  folic acid   1 mg Oral QPM   insulin  aspart  0-5 Units Subcutaneous QHS   insulin  aspart  3 Units Subcutaneous TID WC   isosorbide  mononitrate  60 mg Oral Daily   metoprolol  tartrate  25 mg Oral BID   pantoprazole   40 mg Oral Daily   valACYclovir  1,000 mg Oral TID   Continuous Infusions:   LOS: 3 days    Time spent: 40 min  Barbee Lew, MD 03/30/2024, 2:41 PM

## 2024-03-30 NOTE — Op Note (Signed)
 NAME: [name] [name], [name] [name] MEDICAL RECORD NO: [id number] ACCOUNT NO: [id number] DATE OF BIRTH: 15-Sep-1941 FACILITY: [facility] LOCATION: [facility] PHYSICIAN: Gloriann Larger. Rozelle Corning, MD  Operative Report   DATE OF PROCEDURE: 03/30/2024  PREOPERATIVE DIAGNOSIS:  Complex left proximal humerus fracture.  POSTOPERATIVE DIAGNOSIS:  Complex left proximal humerus fracture.  PROCEDURE:  Left reverse shoulder replacement using Biomet medium augmented baseplate with 36 standard glenosphere set on 1.5 mm offset inferiorly with comprehensive humeral fracture stem 10 mm x 122 and mini +6 tapered offset humeral tray with 36  standard polyethylene bearing.  SURGEON:  Gloriann Larger. Rozelle Corning, MD.  ASSISTANT:  Prentis Brock, PA.  INDICATIONS: This is an 83 year old patient with a complex left proximal humerus fracture who presents for operative management after explanation of risks and benefits.  DESCRIPTION OF PROCEDURE:  The patient was brought to the operating room where general anesthetic was induced.  Preoperative antibiotics were administered.  Timeout was called.  The patient was placed in the beach chair position with the head in neutral  position.  The left shoulder, arm, and hand were prescrubbed with hydrogen peroxide followed by alcohol  and then Betadine and then prepped with ChloraPrep solution and draped in a sterile manner.  Ioban was used to seal the operative field and cover the  operative field.  After calling timeout, a deltopectoral approach was made.  IrriSept solution was utilized.  The cephalic vein was mobilized laterally.  Manual dissection was performed between the deltoid and the pectoralis tendon.  The pectoralis  tendon was not detached.  Adhesions were removed in the subacromial and subdeltoid space.  The anterior portion of the deltoid attachment was manually elevated about 1.5 cm to decrease tension on the deltoid.  At this time, a Kolbel retractor was placed.   The axillary nerve was palpated.  The  biceps tendon was tenodesed to the pectoralis tendon.  The biceps tendon was then used to open up the interval in the bicipital groove.  The bicipital groove was then split with an osteotome and the back portion of  the lesser tuberosity was thinned out using a rongeur.  The lesser tuberosity with subscapularis attached was then tagged.  It was then mobilized with release of the rotator interval to the base of the coracoid.  Next, we placed tagging sutures around  the greater tuberosity, which was also thinned out using a rongeur.  The humeral head portion was then removed using a rongeur.  Once we got control of the tuberosities and the head was removed, the anterior and posterior retractors were placed.  Using  the CT scan for preoperative planning, the guide pin was placed in the middle to inferior portion of the widest part of the glenoid vault.  Reaming was then performed using a 10-degree inferior offset guide, which was placed a little bit more superior  than 10 degrees.  Reaming was performed with about 50% contact and about 3 mm reaming on the inferior glenoid surface.  There was significant concave erosion from arthritis in the glenoid.  Next, we did reaming to accommodate medium offset augment, which  was primarily superior.  Reaming was performed for that.  The trial medium augmented baseplate sat nicely into the created defect.  The vault itself was very shallow, but it was sounded with the smooth end of a drill bit prior to final determination of  the trajectory for the baseplate.  The vault integrity was maintained.  Thorough irrigation was performed.  We then placed baseplate into position with  the augment superiorly.  One central compression screw and four peripheral locking screws were placed,  which gave very good fixation.  Next, we turned our attention towards the humerus.  Reaming and then broaching was performed.  The size 10 fit best.  We were able to be seated fairly flush with the  metaphyseal flare of the inferior portion of the  humeral neck.  I did trial reduction with the +6 offset tray and the standard liner and that gave a very good stable construct.  At this time, the trial implants were removed.  We placed the true glenosphere with 1.5 mm inferior offset onto the  baseplate.  That was done on a dried Morse taper with excellent fit.  Next, we placed the fracture stem in 30 degrees of retroversion with very good fit obtained.  Similar stability with the trial reduction.  We did put two SutureTapes in the lateral  portion of the humeral shaft in order to facilitate reattachment of the greater tuberosity.  The lesser tuberosity would not mobilize over to the implant.  Next, thorough irrigation was performed.  We then placed bone graft from the head at the interface  between the greater tuberosity and the shaft.  The construct was reduced with very good stability in extension, adduction with a forward force as well as internal and external rotation.  Those sutures were tied around both the implant as well as to the  humeral shaft in order to facilitate stable repair of the tuberosity back to the implant.  Next, an attempt was made to mobilize the lesser tuberosity to the implant, but that was not successful.  That bone was removed and then the subscapularis was  allowed to retract.  The axillary nerve was again palpated and intact.  Thorough irrigation was then again performed with 3 liters of pouring irrigation followed by IrriSept.  Vancomycin  powder was placed on the implant.  The sutures were tied, which  hoisted down the tuberosity back into its near anatomic position.  Then, additional bone graft was placed around the metaphysis.  All in all, a very stable construct with excellent range of motion was achieved.  The deltopectoral interval was then closed  using #1 Vicryl suture followed by interrupted inverted 0 Vicryl suture, 2-0 0 Vicryl sutures and 3-0 Monocryl with  Steri-Strips, Aquacel dressing, and an immobilizer placed.  The patient tolerated the procedure well without immediate complication.   Luke's assistance was required for retraction, opening, closing, and mobilization of tissue.  His assistance was a medical necessity at all times.   PUS D: 03/30/2024 11:39:09 am T: 03/30/2024 3:15:00 pm  JOB: [id number]/ [id number]

## 2024-03-30 NOTE — Progress Notes (Signed)
 PT Cancellation Note  Patient Details Name: [name] [name] MRN: [id number] DOB: 19-Dec-1940   Cancelled Treatment:    Reason Eval/Treat Not Completed: Patient at procedure or test/unavailable (Pt in surgery for left humeral fx.  Will check back and evaluate as able.)   Florencia Hunter 03/30/2024, 9:16 AM Keila Turan M,PT Acute Rehab Services [phone]

## 2024-03-30 NOTE — Plan of Care (Signed)
  Problem: Education: Goal: Knowledge of General Education information will improve Description: Including pain rating scale, medication(s)/side effects and non-pharmacologic comfort measures Outcome: Progressing   Problem: Health Behavior/Discharge Planning: Goal: Ability to manage health-related needs will improve Outcome: Not Progressing   Problem: Clinical Measurements: Goal: Ability to maintain clinical measurements within normal limits will improve Outcome: Progressing Goal: Will remain free from infection Outcome: Progressing Goal: Diagnostic test results will improve Outcome: Progressing Goal: Respiratory complications will improve Outcome: Progressing Goal: Cardiovascular complication will be avoided Outcome: Progressing   Problem: Activity: Goal: Risk for activity intolerance will decrease Outcome: Not Progressing   Problem: Nutrition: Goal: Adequate nutrition will be maintained Outcome: Progressing   Problem: Coping: Goal: Level of anxiety will decrease Outcome: Progressing   Problem: Elimination: Goal: Will not experience complications related to bowel motility Outcome: Progressing Goal: Will not experience complications related to urinary retention Outcome: Progressing   Problem: Pain Managment: Goal: General experience of comfort will improve and/or be controlled Outcome: Progressing   Problem: Safety: Goal: Ability to remain free from injury will improve Outcome: Progressing   Problem: Skin Integrity: Goal: Risk for impaired skin integrity will decrease Outcome: Progressing   Problem: Education: Goal: Ability to describe self-care measures that may prevent or decrease complications (Diabetes Survival Skills Education) will improve Outcome: Progressing Goal: Individualized Educational Video(s) Outcome: Progressing   Problem: Coping: Goal: Ability to adjust to condition or change in health will improve Outcome: Progressing   Problem: Fluid  Volume: Goal: Ability to maintain a balanced intake and output will improve Outcome: Progressing   Problem: Metabolic: Goal: Ability to maintain appropriate glucose levels will improve Outcome: Progressing   Problem: Nutritional: Goal: Maintenance of adequate nutrition will improve Outcome: Progressing Goal: Progress toward achieving an optimal weight will improve Outcome: Progressing

## 2024-03-31 DIAGNOSIS — S42292A Other displaced fracture of upper end of left humerus, initial encounter for closed fracture: Secondary | ICD-10-CM | POA: Diagnosis not present

## 2024-03-31 DIAGNOSIS — S42412A Displaced simple supracondylar fracture without intercondylar fracture of left humerus, initial encounter for closed fracture: Secondary | ICD-10-CM | POA: Diagnosis not present

## 2024-03-31 DIAGNOSIS — M069 Rheumatoid arthritis, unspecified: Secondary | ICD-10-CM | POA: Diagnosis not present

## 2024-03-31 LAB — BASIC METABOLIC PANEL WITH GFR
Anion gap: 6 (ref 5–15)
BUN: 13 mg/dL (ref 8–23)
CO2: 23 mmol/L (ref 22–32)
Calcium: 8.3 mg/dL — ABNORMAL LOW (ref 8.9–10.3)
Chloride: 108 mmol/L (ref 98–111)
Creatinine, Ser: 0.95 mg/dL (ref 0.44–1.00)
GFR, Estimated: 59 mL/min — ABNORMAL LOW (ref >60)
Glucose, Bld: 214 mg/dL — ABNORMAL HIGH (ref 70–99)
Potassium: 4.2 mmol/L (ref 3.5–5.1)
Sodium: 137 mmol/L (ref 135–145)

## 2024-03-31 LAB — CBC
HCT: 24.4 % — ABNORMAL LOW (ref 36.0–46.0)
Hemoglobin: 7.8 g/dL — ABNORMAL LOW (ref 12.0–15.0)
MCH: 30.5 pg (ref 26.0–34.0)
MCHC: 32 g/dL (ref 30.0–36.0)
MCV: 95.3 fL (ref 80.0–100.0)
Platelets: 159 10*3/uL (ref 150–400)
RBC: 2.56 MIL/uL — ABNORMAL LOW (ref 3.87–5.11)
RDW: 14.6 % (ref 11.5–15.5)
WBC: 9.2 10*3/uL (ref 4.0–10.5)
nRBC: 0 % (ref 0.0–0.2)

## 2024-03-31 LAB — GLUCOSE, CAPILLARY
Glucose-Capillary: 165 mg/dL — ABNORMAL HIGH (ref 70–99)
Glucose-Capillary: 234 mg/dL — ABNORMAL HIGH (ref 70–99)
Glucose-Capillary: 345 mg/dL — ABNORMAL HIGH (ref 70–99)

## 2024-03-31 LAB — URINE CULTURE: Culture: NO GROWTH

## 2024-03-31 LAB — VITAMIN D 25 HYDROXY (VIT D DEFICIENCY, FRACTURES): Vit D, 25-Hydroxy: 36.24 ng/mL (ref 30–100)

## 2024-03-31 MED ORDER — INSULIN ASPART 100 UNIT/ML IJ SOLN
0.0000 [IU] | Freq: Every day | INTRAMUSCULAR | Status: DC
  Administered 2024-03-31: 4 [IU] via SUBCUTANEOUS

## 2024-03-31 MED ORDER — BISACODYL 5 MG PO TBEC
10.0000 mg | DELAYED_RELEASE_TABLET | Freq: Every day | ORAL | Status: DC
  Administered 2024-03-31 – 2024-04-06 (×6): 10 mg via ORAL
  Filled 2024-03-31 (×8): qty 2

## 2024-03-31 MED ORDER — VALACYCLOVIR HCL 500 MG PO TABS
1000.0000 mg | ORAL_TABLET | Freq: Two times a day (BID) | ORAL | Status: DC
  Administered 2024-03-31 – 2024-04-06 (×13): 1000 mg via ORAL
  Filled 2024-03-31 (×13): qty 2

## 2024-03-31 MED ORDER — POLYETHYLENE GLYCOL 3350 17 G PO PACK
17.0000 g | PACK | Freq: Two times a day (BID) | ORAL | Status: DC
  Administered 2024-03-31 – 2024-04-06 (×9): 17 g via ORAL
  Filled 2024-03-31 (×11): qty 1

## 2024-03-31 MED ORDER — VITAMIN D 25 MCG (1000 UNIT) PO TABS
2000.0000 [IU] | ORAL_TABLET | Freq: Every day | ORAL | Status: DC
  Administered 2024-03-31 – 2024-04-06 (×7): 2000 [IU] via ORAL
  Filled 2024-03-31 (×7): qty 2

## 2024-03-31 NOTE — Progress Notes (Signed)
 PROGRESS NOTE    [name] [name]  UEA:[id number] DOB: 1941/10/09 DOA: 03/27/2024 PCP: Pete Brand, DO   Brief Narrative:   83 y.o. female with medical history significant of RA on methotrexate , type II DM, CAD status post CABG, brought to the emergency department for evaluation after mechanical fall.  She reports left arm and left hip pain and subsequent decreased range of motion of the left upper and lower extremities.  No paresthesias or change in sensation. She was diagnosed with zoster and started acyclovir prior to the fall.  She reports the symptoms are improving. She is not on any anticoagulants.  ED workup significant for left humeral fracture.  She was hospitalized for further management.  Assessment & Plan:   Principal Problem:   Left supracondylar humerus fracture, closed, initial encounter Active Problems:   Carotid artery disease (HCC)   CAD (coronary artery disease)   Rheumatoid arthritis (HCC)   Insulin  dependent type 2 diabetes mellitus (HCC)  Left humerus fracture-status post left shoulder reverse arthroplasty Dr. Rozelle Corning Mar 30, 2024.  Ortho had consulted vascular due to asymmetric radial pulses on the left compared to the right.  Patient with history of prior CABG and radial A-line.   A CT angiogram of the left upper extremity was done-  No evidence of acute vascular injury at the level of the known left humeral head and neck fracture.Nonvisualization of the radial artery from the proximal forearm through the wrist, of uncertain acuity. The origin of the radial artery opacifies normally, and there is reconstitution of the radial artery at the level of the wrist.Diminutive but patent radial artery is visualized throughout its course.   Nonvascular:  Comminuted left humeral head and neck fracture not appreciably changed since prior study. Severe inferior subluxation of the humeral head within the glenoid fossa. Persistent soft tissue edema surrounding the left  shoulder and extending into the proximal upper arm. No fluid collection or hematoma.  Herpes zoster right chest/flank area Recently diagnosed.  Started on Valtrex.  Contact precautions for now.   History of rheumatoid arthritis On methotrexate  at home which is currently on hold.   Coronary artery disease She is status post PCI and CABG.  Last PCI was in 2023.  Drug-eluting stent was placed in mid LAD at that time. Continue Aspirin  and Plavix     Continue with metoprolol . ARB is on hold.(Soft bp)   Normocytic anemia stable hb 7.8 from 10.3 No signs of active bleed Follow in am   Diabetes mellitus type 2 On SSI. Prior to admission patient was on 75/25 insulin . HbA1c  7.2 CBG (last 3)  Recent Labs    03/30/24 1601 03/30/24 2017 03/31/24 0623  GLUCAP 200* 190* 165*    Estimated body mass index is 24.92 kg/m as calculated from the following:   Height as of this encounter: 5' 1.5" (1.562 m).   Weight as of this encounter: 60.8 kg.  DVT prophylaxis scd Code Status: full Family Communication:daughter at bed side Disposition Plan:  Status is: Inpatient Remains inpatient appropriate because: acute humerus fracture   Consultants:  ortho Procedures: none Antimicrobials:  None Subjective: She reports her pain at 10 out of 10 left shoulder had a rough night due to pain No BM since admission to hospital  Objective: Vitals:   03/30/24 2128 03/31/24 0105 03/31/24 0601 03/31/24 0811  BP: (!) 109/54 115/62 (!) 123/54 (!) 110/50  Pulse: 91 93 (!) 57 (!) 56  Resp: 20 18 17 16   Temp: 98.5 F (36.9  C) 98.6 F (37 C) 98.4 F (36.9 C) 98.5 F (36.9 C)  TempSrc:  Oral  Oral  SpO2: 98% 98% 100% 100%  Weight:      Height:        Intake/Output Summary (Last 24 hours) at 03/31/2024 1154 Last data filed at 03/30/2024 1915 Gross per 24 hour  Intake 100 ml  Output 425 ml  Net -325 ml   Filed Weights   03/27/24 1506 03/27/24 1552  Weight: 60.8 kg 60.8 kg     Examination:  General exam: Appears in mild distress  respiratory system: Clear to auscultation. Respiratory effort normal. Cardiovascular system: S1 & S2 heard, RRR. No JVD, murmurs, rubs, gallops or clicks. No pedal edema. Gastrointestinal system: Abdomen is nondistended, soft and nontender. No organomegaly or masses felt. Normal bowel sounds heard. Central nervous system: Alert and oriented. No focal neurological deficits. Extremities: Trace edema left upper extremity with decreased range of motion  Data Reviewed: I have personally reviewed following labs and imaging studies  CBC: Recent Labs  Lab 03/27/24 2259 03/28/24 1212 03/29/24 0641 03/30/24 1259 03/31/24 0457  WBC 7.5 7.2 7.6 10.4 9.2  HGB 10.8* 10.0* 10.3* 10.0* 7.8*  HCT 33.0* 29.8* 31.1* 30.7* 24.4*  MCV 95.1 93.1 92.8 94.5 95.3  PLT 169 171 170 161 159   Basic Metabolic Panel: Recent Labs  Lab 03/27/24 2259 03/28/24 0924 03/29/24 0641 03/30/24 1259 03/31/24 0457  NA 135 137 134* 139 137  K 4.1 4.0 4.1 3.9 4.2  CL 105 110 105 107 108  CO2 21* 17* 22 19* 23  GLUCOSE 191* 160* 131* 183* 214*  BUN 13 12 10 10 13   CREATININE 0.79 0.89 0.84 0.90 0.95  CALCIUM  9.0 8.8* 8.6* 8.7* 8.3*  MG  --   --  1.9  --   --    GFR: Estimated Creatinine Clearance: 38 mL/min (by C-G formula based on SCr of 0.95 mg/dL). Liver Function Tests: No results for input(s): "AST", "ALT", "ALKPHOS", "BILITOT", "PROT", "ALBUMIN " in the last 168 hours. No results for input(s): "LIPASE", "AMYLASE" in the last 168 hours. No results for input(s): "AMMONIA" in the last 168 hours. Coagulation Profile: Recent Labs  Lab 03/27/24 2259  INR 1.2   Cardiac Enzymes: No results for input(s): "CKTOTAL", "CKMB", "CKMBINDEX", "TROPONINI" in the last 168 hours. BNP (last 3 results) No results for input(s): "PROBNP" in the last 8760 hours. HbA1C: Recent Labs    03/29/24 0641  HGBA1C 7.2*   CBG: Recent Labs  Lab 03/30/24 1132  03/30/24 1353 03/30/24 1601 03/30/24 2017 03/31/24 0623  GLUCAP 157* 178* 200* 190* 165*   Lipid Profile: No results for input(s): "CHOL", "HDL", "LDLCALC", "TRIG", "CHOLHDL", "LDLDIRECT" in the last 72 hours. Thyroid  Function Tests: No results for input(s): "TSH", "T4TOTAL", "FREET4", "T3FREE", "THYROIDAB" in the last 72 hours. Anemia Panel: No results for input(s): "VITAMINB12", "FOLATE", "FERRITIN", "TIBC", "IRON", "RETICCTPCT" in the last 72 hours. Sepsis Labs: No results for input(s): "PROCALCITON", "LATICACIDVEN" in the last 168 hours.  Recent Results (from the past 240 hours)  Surgical pcr screen     Status: None   Collection Time: 03/28/24 10:00 PM   Specimen: Nasal Mucosa; Nasal Swab  Result Value Ref Range Status   MRSA, PCR NEGATIVE NEGATIVE Final   Staphylococcus aureus NEGATIVE NEGATIVE Final    Comment: (NOTE) The Xpert SA Assay (FDA approved for NASAL specimens in patients 51 years of age and older), is one component of a comprehensive surveillance program. It is not  intended to diagnose infection nor to guide or monitor treatment. Performed at [facility], [address] [place], [place] [address]   Urine Culture     Status: None   Collection Time: 03/30/24  8:36 AM   Specimen: Urine, Catheterized  Result Value Ref Range Status   Specimen Description URINE, CATHETERIZED  Final   Special Requests NONE  Final   Culture   Final    NO GROWTH Performed at [facility], [address] [place], [place] [address]    Report Status 03/31/2024 FINAL  Final         Radiology Studies: DG Shoulder Left Port Result Date: 03/30/2024 CLINICAL DATA:  Status post shoulder surgery. EXAM: LEFT SHOULDER COMPARISON:  Preoperative imaging FINDINGS: Reverse left shoulder arthroplasty in expected alignment. No periprosthetic lucency or fracture. Recent postsurgical change includes air and edema in the joint space and soft tissues. IMPRESSION: Reverse left  shoulder arthroplasty without immediate postoperative complication. Electronically Signed   By: Chadwick Colonel M.D.   On: 03/30/2024 12:51   DG Knee Left Port Result Date: 03/29/2024 CLINICAL DATA:  Left knee effusion EXAM: PORTABLE LEFT KNEE - 1-2 VIEW COMPARISON:  06/03/2014 FINDINGS: Frontal and lateral views of the left knee demonstrates severe 3 compartmental osteoarthritis with near complete loss of joint space within the medial and lateral compartments. This has progressed since prior exam. Small suprapatellar joint effusion. No acute displaced fracture, subluxation, or dislocation. Diffuse atherosclerosis. IMPRESSION: 1. Severe progressive 3 compartmental osteoarthritis greatest in the medial and lateral compartments. 2. Small suprapatellar joint effusion. 3. No acute fracture. Electronically Signed   By: Bobbye Burrow M.D.   On: 03/29/2024 21:49   CT ANGIO UP EXTREM LEFT W &/OR WO CONTAST Result Date: 03/29/2024 CLINICAL DATA:  Comminuted left humeral head and neck fracture, diminished pulses left upper extremity EXAM: CT ANGIOGRAPHY OF THE UPPER LEFT EXTREMITY WITH CONTRAST TECHNIQUE: Multidetector CT imaging of the upper left extremity was performed according to the standard protocol following intravenous contrast administration. RADIATION DOSE REDUCTION: This exam was performed according to the departmental dose-optimization program which includes automated exposure control, adjustment of the mA and/or kV according to patient size and/or use of iterative reconstruction technique. CONTRAST:  75mL OMNIPAQUE  IOHEXOL  350 MG/ML SOLN COMPARISON:  03/27/2024 FINDINGS: Bones/Joint/Cartilage The comminuted impacted left humeral head and neck fracture seen on prior study is again identified. Since the previous exam, there has been no appreciable change in the orientation of the fracture fragments, with impaction and medial displacement of the distal humerus relative to the humeral head and neck again  noted. Severe inferior subluxation of the humeral head relative to the glenoid again noted. No other acute bony abnormalities. Ligaments Suboptimally assessed by CT. Muscles and Tendons Continued intramuscular edema surrounding the left shoulder fracture. Soft tissues There is diffuse subcutaneous edema within the left supraclavicular region, surrounding the left shoulder, and extending into the left upper arm. No fluid collection or hematoma. Visualized portions of the left chest demonstrates left lower lobe bronchiectasis and scarring. No acute pleural or parenchymal lung disease. Visualized mediastinal structures are grossly unremarkable. Limited imaging of the intraperitoneal structures demonstrates a large cyst off the lower pole left kidney, for which no specific imaging follow-up is required. Vascular Visualized portions of the thoracic aorta are unremarkable. Great vessels are widely patent at their origin. The left subclavian, axillary, and brachial arteries are widely patent. Evaluation of the arterial structures distal to the elbow is somewhat limited due  to timing of contrast bolus. The radial artery is seen at its origin, but is not well visualized within the mid to distal forearm. There is distal reconstitution at the level of the wrist. The ulnar artery is patent throughout its course, but somewhat diminutive. A diminutive interosseous artery is visualized to the level of the distal forearm. Reconstructed images demonstrate no additional findings. IMPRESSION: Vascular: 1. No evidence of acute vascular injury at the level of the known left humeral head and neck fracture. 2. Nonvisualization of the radial artery from the proximal forearm through the wrist, of uncertain acuity. The origin of the radial artery opacifies normally, and there is reconstitution of the radial artery at the level of the wrist. 3. Diminutive but patent radial artery is visualized throughout its course. Nonvascular: 1. Comminuted  left humeral head and neck fracture not appreciably changed since prior study. Severe inferior subluxation of the humeral head within the glenoid fossa. 2. Persistent soft tissue edema surrounding the left shoulder and extending into the proximal upper arm. No fluid collection or hematoma. Electronically Signed   By: Bobbye Burrow M.D.   On: 03/29/2024 19:01   MR HIP LEFT WO CONTRAST Result Date: 03/29/2024 CLINICAL DATA:  Hip trauma.  Fracture suspected. EXAM: MR OF THE LEFT HIP WITHOUT CONTRAST TECHNIQUE: Multiplanar, multisequence MR imaging was performed. No intravenous contrast was administered. COMPARISON:  Pelvis and left hip radiographs 03/27/2024, CT abdomen and pelvis 01/28/2016, CT abdomen pelvis 06/03/2014 FINDINGS: Bones: There is curvilinear decreased T1 and increased T2 with high-grade surrounding marrow edema indicating an acute to subacute nondisplaced fracture of the left pubic body (coronal series 3, image 27, axial series 5, image 20). Additional acute nondisplaced fracture with high-grade surrounding marrow edema of the mid AP dimension of the left inferior pubic ramus (axial series 5, image 25). Additional a vertical oriented linear decreased T1 increased T2 signal indicating an acute to subacute nondisplaced fracture of the medial aspect of the left iliac bone near the left sacroiliac joint (coronal series 3 and 4 images 8 through 11). Articular cartilage and labrum Left hip: Articular cartilage: Moderate to high-grade anterior superior left femoral head and acetabular cartilage thinning. No left hip joint effusion. Labrum: Mild attenuation and degenerative irregularity of the anterior superior left acetabular labrum. Joint or bursal effusion Joint effusion: Tiny left hip joint effusion. No right hip joint effusion. Bursae: No trochanteric bursitis on either side. Muscles and tendons Muscles and tendons: Moderate edema throughout the left adductor muscles including the obturator externus  and adductor longus, brevis, and magnus muscles originating off of the left pubic body and inferior left pubic ramus. Mild edema within the right groin musculature originating off of the right pubic body and inferior right pubic ramus. Other findings Miscellaneous: Mild presacral fluid. The uterus appears surgically absent. IMPRESSION: 1. Acute to subacute nondisplaced fractures of the left pubic body and left inferior pubic ramus. 2. Acute to subacute nondisplaced vertically oriented fracture of the medial aspect of the left iliac bone near the left sacroiliac joint. This does not appear to extend into the sacroiliac joint. 3. Moderate edema throughout the left adductor muscles originating off of the left pubic body and inferior left pubic ramus. Mild edema within the contralateral right groin musculature. 4. Moderate to high-grade anterior superior left femoral head and acetabular cartilage thinning. Electronically Signed   By: Bertina Broccoli M.D.   On: 03/29/2024 18:02   DG Ankle Complete Left Result Date: 03/29/2024 CLINICAL DATA:  [id number] Ankle swelling,  left [id number] EXAM: LEFT ANKLE COMPLETE - 3+ VIEW COMPARISON:  None Available. FINDINGS: No acute fracture or dislocation. No ankle mortise widening. The talar dome is intact. There is no evidence of arthropathy or other focal bone abnormality. Soft tissue swelling about the ankle. IMPRESSION: Soft tissue swelling about the ankle. Otherwise, no acute fracture or dislocation. Electronically Signed   By: Rance Burrows M.D.   On: 03/29/2024 15:22    Scheduled Meds:  acetaminophen   1,000 mg Oral Q6H   aspirin  EC  81 mg Oral Daily   bisacodyl   10 mg Oral Q0200   clopidogrel   75 mg Oral Q breakfast   docusate sodium   100 mg Oral BID   folic acid   1 mg Oral QPM   insulin  aspart  0-5 Units Subcutaneous QHS   insulin  aspart  3 Units Subcutaneous TID WC   isosorbide  mononitrate  60 mg Oral Daily   metoprolol  tartrate  25 mg Oral BID   pantoprazole   40 mg  Oral Daily   polyethylene glycol  17 g Oral BID   valACYclovir  1,000 mg Oral Q12H   Continuous Infusions:   LOS: 4 days    Time spent: 40 min  Barbee Lew, MD 03/31/2024, 11:54 AM

## 2024-03-31 NOTE — Inpatient Diabetes Management (Signed)
 Inpatient Diabetes Program Recommendations  AACE/ADA: New Consensus Statement on Inpatient Glycemic Control   Target Ranges:  Prepandial:   less than 140 mg/dL      Peak postprandial:   less than 180 mg/dL (1-2 hours)      Critically ill patients:  140 - 180 mg/dL    Latest Reference Range & Units 03/30/24 05:58 03/30/24 11:32 03/30/24 13:53 03/30/24 16:01 03/30/24 20:17 03/31/24 06:23  Glucose-Capillary 70 - 99 mg/dL 161 (H) 096 (H) 045 (H) 200 (H) 190 (H) 165 (H)   Review of Glycemic Control  Diabetes history: DM2 Outpatient Diabetes medications: Humalog 75/25 3-5 units QAM Current orders for Inpatient glycemic control: Novolog  3 units TID with meals, Novolog  0-5 units QHS  Inpatient Diabetes Program Recommendations:    Insulin : Patient received Decadron 5 mg on 03/30/24 at 8:05 am.  Please consider ordering Novolog  0-9 units TID with meals.  May want to consider discontinuing Novolog  3 units TID with meals if patient has any issues with hypoglycemia.  Thanks, Beacher Limerick, RN, MSN, CDCES Diabetes Coordinator Inpatient Diabetes Program [phone] (Team Pager from 8am to 5pm)

## 2024-03-31 NOTE — Progress Notes (Signed)
 PHARMACY NOTE:  ANTIMICROBIAL RENAL DOSAGE ADJUSTMENT  Current antimicrobial regimen includes a mismatch between antimicrobial dosage and estimated renal function.  As per policy approved by the Pharmacy & Therapeutics and Medical Executive Committees, the antimicrobial dosage will be adjusted accordingly.  Current antimicrobial dosage:  valacyclovir 1g TID   Indication: herpes zoster  Renal Function: Estimated Creatinine Clearance: 38 mL/min (by C-G formula based on SCr of 0.95 mg/dL).   Antimicrobial dosage has been changed to:  1g q12hr     Thank you for allowing pharmacy to be a part of this patient's care.  Dorene Gang, PharmD, BCPS, BCCP Clinical Pharmacist  Please check AMION for all [facility] phone numbers After 10:00 PM, call Main Pharmacy [phone]

## 2024-03-31 NOTE — Progress Notes (Signed)
  Subjective: [name] [name] is a 83 y.o. female s/p left RSA.  They are POD 1.  Pt's pain is controlled.  Patient denies any complaints of chest pain, shortness of breath, abdominal pain.  Shoulder pain is tolerable.  Nerve block is still somewhat in effect.  Her main concern is actually her hip pain which bothers her more than her shoulder does.  She has primarily anterior hip pain around the pubic symphysis as well as pain in the posterior buttock.  She has difficulty when she gets turned by nursing staff and this causes severe pain.  As long as she stays still, she has decreased pain and the hip pain is tolerable.  Objective: Vital signs in last 24 hours: Temp:  [98.4 F (36.9 C)-98.6 F (37 C)] 98.5 F (36.9 C) (05/16 0811) Pulse Rate:  [56-93] 56 (05/16 0811) Resp:  [16-20] 16 (05/16 0811) BP: (109-123)/(50-62) 110/50 (05/16 0811) SpO2:  [98 %-100 %] 100 % (05/16 0811)  Intake/Output from previous day: 05/15 0701 - 05/16 0700 In: 1250 [I.V.:700; IV Piggyback:550] Out: 1525 [Urine:1425; Blood:100] Intake/Output this shift: No intake/output data recorded.  Exam:  No gross blood or drainage overlying the dressing Nonpalpable radial pulse of the operative extremity as consistent with her preoperative exam and has been evaluated by vascular surgery. Postoperative physical exam somewhat limited by interscalene block but intact EPL, FPL, finger abduction, pronation/supination, bicep, tricep.  Deltoid contraction still difficult to feel at this time but it does seem like there is some firing but not quite to the level of the contralateral side.   Labs: Recent Labs    03/29/24 0641 03/30/24 1259 03/31/24 0457  HGB 10.3* 10.0* 7.8*   Recent Labs    03/30/24 1259 03/31/24 0457  WBC 10.4 9.2  RBC 3.25* 2.56*  HCT 30.7* 24.4*  PLT 161 159   Recent Labs    03/30/24 1259 03/31/24 0457  NA 139 137  K 3.9 4.2  CL 107 108  CO2 19* 23  BUN 10 13  CREATININE 0.90 0.95   GLUCOSE 183* 214*  CALCIUM  8.7* 8.3*   No results for input(s): "LABPT", "INR" in the last 72 hours.  Assessment/Plan: Pt is POD 1 s/p left RSA    -Plan to discharge to home versus SNF in coming days pending patient's pain and mobility.    -No lifting with the operative arm; no range of motion with the operative shoulder but okay for range of motion of the elbow, hand, wrist  - Nonweightbearing for the left lower extremity due to multiple pelvic fractures that are nonoperative.  We will plan to keep her nonweightbearing for the first 3 weeks as we get out from this injury and then she can transition to weightbearing as tolerated.  - Ordered vitamin D in order to evaluate for vitamin D deficiency and ensure we limit chance of nonunion of pelvic fractures.  - Aspirin /Plavix /SCDs for DVT prophylaxis.  -Follow-up with Dr. Rozelle Corning in clinic 2 weeks postoperatively     [facility] 03/31/2024, 3:59 PM

## 2024-03-31 NOTE — Progress Notes (Signed)
 PT Cancellation Note  Patient Details Name: [name] [name] MRN: [id number] DOB: 02/27/1941   Cancelled Treatment:    Reason Eval/Treat Not Completed: Fatigue/lethargy limiting ability to participate. Pt very lethargic from anesthesia and pain meds. Daughter present in room and in agreement that pt needs to rest. PT to follow up tomorrow for eval.    Guadelupe Leech 03/31/2024, 12:08 PM

## 2024-03-31 NOTE — Plan of Care (Signed)
  Problem: Education: Goal: Knowledge of General Education information will improve Description: Including pain rating scale, medication(s)/side effects and non-pharmacologic comfort measures Outcome: Progressing   Problem: Health Behavior/Discharge Planning: Goal: Ability to manage health-related needs will improve Outcome: Not Progressing   Problem: Clinical Measurements: Goal: Ability to maintain clinical measurements within normal limits will improve Outcome: Progressing Goal: Will remain free from infection Outcome: Progressing Goal: Diagnostic test results will improve Outcome: Progressing Goal: Respiratory complications will improve Outcome: Progressing Goal: Cardiovascular complication will be avoided Outcome: Progressing   Problem: Activity: Goal: Risk for activity intolerance will decrease Outcome: Not Progressing   Problem: Nutrition: Goal: Adequate nutrition will be maintained Outcome: Progressing   Problem: Coping: Goal: Level of anxiety will decrease Outcome: Progressing   Problem: Elimination: Goal: Will not experience complications related to bowel motility Outcome: Progressing Goal: Will not experience complications related to urinary retention Outcome: Progressing   Problem: Safety: Goal: Ability to remain free from injury will improve Outcome: Progressing   Problem: Skin Integrity: Goal: Risk for impaired skin integrity will decrease Outcome: Progressing   Problem: Coping: Goal: Ability to adjust to condition or change in health will improve Outcome: Progressing   Problem: Health Behavior/Discharge Planning: Goal: Ability to identify and utilize available resources and services will improve Outcome: Progressing Goal: Ability to manage health-related needs will improve Outcome: Progressing   Problem: Metabolic: Goal: Ability to maintain appropriate glucose levels will improve Outcome: Progressing   Problem: Nutritional: Goal: Maintenance  of adequate nutrition will improve Outcome: Progressing Goal: Progress toward achieving an optimal weight will improve Outcome: Progressing   Problem: Education: Goal: Knowledge of the prescribed therapeutic regimen will improve Outcome: Progressing Goal: Understanding of activity limitations/precautions following surgery will improve Outcome: Progressing Goal: Individualized Educational Video(s) Outcome: Progressing   Problem: Activity: Goal: Ability to tolerate increased activity will improve Outcome: Not Progressing

## 2024-03-31 NOTE — Evaluation (Signed)
 Occupational Therapy Evaluation Patient Details Name: [name] [name] MRN: [id number] DOB: 07-Feb-1941 Today's Date: 03/31/2024   History of Present Illness   83 y.o. female presents to [facility] hospital on 03/27/2024 after a fall onto left side. Imaging is notable for a L humeral fx. S/p L reverse shoulder replacement 5/15. PMH includes RA, DM II, CABG.     Clinical Impressions Pt admitted based on above, and was seen based on problem list below. PTA pt was independent with ADLs and IADLs. Today pt is requiring set up  to total assist for ADLs. Bed mobility was up to total assist. Pt c/o of significant pain in pelvis with bed mobility unable to maintain EOB <5 minutes, with preference for R lateral lean. RN notified. Pt with slight edema in L hand, repositioned and educated pt on importance of elevation. Recommendation of <3 hours of skilled rehab daily. OT will continue to follow acutely to maximize functional independence.        If plan is discharge home, recommend the following:   A lot of help with walking and/or transfers;A lot of help with bathing/dressing/bathroom     Functional Status Assessment   Patient has had a recent decline in their functional status and demonstrates the ability to make significant improvements in function in a reasonable and predictable amount of time.     Equipment Recommendations   Other (comment) (Defer to next venue)     Recommendations for Other Services         Precautions/Restrictions   Precautions Precautions: Fall Recall of Precautions/Restrictions: Intact Required Braces or Orthoses: Sling Restrictions Weight Bearing Restrictions Per Provider Order: Yes LUE Weight Bearing Per Provider Order: Non weight bearing RLE Weight Bearing Per Provider Order: Weight bearing as tolerated LLE Weight Bearing Per Provider Order: Weight bearing as tolerated     Mobility Bed Mobility Overal bed mobility: Needs Assistance Bed Mobility:  Supine to Sit, Sit to Supine     Supine to sit: Total assist, HOB elevated, Used rails Sit to supine: Max assist, HOB elevated, Used rails   General bed mobility comments: Pt able to assist to lower trunk back to bed, unable to tolerate sitting EOB    Transfers Overall transfer level: Needs assistance     General transfer comment: Deferred      Balance Overall balance assessment: Needs assistance Sitting-balance support: Single extremity supported, Feet supported Sitting balance-Leahy Scale: Poor Sitting balance - Comments: Min to mod assist to maintain upright Postural control: Right lateral lean         ADL either performed or assessed with clinical judgement   ADL Overall ADL's : Needs assistance/impaired Eating/Feeding: Minimal assistance;Bed level   Grooming: Wash/dry face;Set up;Bed level   Upper Body Bathing: Moderate assistance;Bed level   Lower Body Bathing: Bed level;Total assistance   Upper Body Dressing : Moderate assistance;Bed level   Lower Body Dressing: Total assistance;Bed level                 General ADL Comments: Pt limited by hip pain     Vision Baseline Vision/History: 1 Wears glasses Vision Assessment?: No apparent visual deficits     Perception         Praxis         Pertinent Vitals/Pain Pain Assessment Pain Assessment: Faces Faces Pain Scale: Hurts whole lot Pain Location: L pelvis Pain Descriptors / Indicators: Crying, Moaning, Guarding Pain Intervention(s): Limited activity within patient's tolerance, Repositioned, Premedicated before session, Ice applied  Extremity/Trunk Assessment Upper Extremity Assessment Upper Extremity Assessment: LUE deficits/detail;Right hand dominant LUE Deficits / Details: S/P L reverse shoulder replacement, slight edema in hand decreased grip strength, full digit flexion LUE: Unable to fully assess due to immobilization LUE Sensation: WNL LUE Coordination: decreased fine motor    Lower Extremity Assessment Lower Extremity Assessment: Defer to PT evaluation   Cervical / Trunk Assessment Cervical / Trunk Assessment: Kyphotic   Communication Communication Communication: No apparent difficulties   Cognition Arousal: Lethargic Behavior During Therapy: Anxious Cognition: Difficult to assess Difficult to assess due to: Level of arousal     OT - Cognition Comments: Potential cog deficits, suspect d/t medication       Following commands: Impaired Following commands impaired: Follows one step commands with increased time     Cueing  General Comments   Cueing Techniques: Verbal cues;Tactile cues  Pt c/o of lightheadedness with sitting EOB & extreme hip pain, RN and MD notified           Home Living Family/patient expects to be discharged to:: Private residence Living Arrangements: Alone Available Help at Discharge: Family;Available PRN/intermittently Type of Home: House Home Access: Stairs to enter [employer] of Steps: 5 Entrance Stairs-Rails: Left Home Layout: One level;Laundry or work area in basement     [employer] Shower/Tub: Producer, television/film/video: Handicapped height Bathroom Accessibility: Yes   Home Equipment: Grab bars - tub/shower;BSC/3in1;Rolling Environmental consultant (2 wheels);Cane - single point;Shower seat          Prior Functioning/Environment Prior Level of Function : Independent/Modified Independent             Mobility Comments: No AD      OT Problem List: Decreased strength;Decreased range of motion;Decreased activity tolerance;Impaired balance (sitting and/or standing);Decreased safety awareness;Impaired UE functional use   OT Treatment/Interventions: Self-care/ADL training;Therapeutic exercise;DME and/or AE instruction;Energy conservation;Therapeutic activities;Patient/family education;Balance training      OT Goals(Current goals can be found in the care plan section)   Acute Rehab OT Goals Patient  Stated Goal: To stop moving OT Goal Formulation: With patient Time For Goal Achievement: 04/14/24 Potential to Achieve Goals: Good   OT Frequency:  Min 2X/week       AM-PAC OT "6 Clicks" Daily Activity     Outcome Measure Help from another person eating meals?: A Little Help from another person taking care of personal grooming?: A Little Help from another person toileting, which includes using toliet, bedpan, or urinal?: Total Help from another person bathing (including washing, rinsing, drying)?: A Lot Help from another person to put on and taking off regular upper body clothing?: A Lot Help from another person to put on and taking off regular lower body clothing?: A Lot 6 Click Score: 13   End of Session Equipment Utilized During Treatment: Other (comment) (Sling) Nurse Communication: Mobility status  Activity Tolerance: Patient limited by pain Patient left: in bed;with call bell/phone within reach;with bed alarm set  OT Visit Diagnosis: Unsteadiness on feet (R26.81);Other abnormalities of gait and mobility (R26.89);Feeding difficulties (R63.3)                Time: 5621-3086 OT Time Calculation (min): 53 min Charges:  OT General Charges $OT Visit: 1 Visit OT Evaluation $OT Eval Moderate Complexity: 1 Mod OT Treatments $Self Care/Home Management : 38-52 mins  Delmer Ferraris, OT  Acute Rehabilitation Services Office [phone] Secure chat preferred   Mickael Alamo 03/31/2024, 11:43 AM

## 2024-03-31 NOTE — Evaluation (Addendum)
 SLP Cancellation Note  Patient Details Name: [name] [name] MRN: [id number] DOB: 04-02-1941   Cancelled treatment:       Reason Eval/Treat Not Completed: Other (comment) (pt declined to participate in swallow evaluation, stating she manages her po fine, reports taking pills crushed and states her medications were not crushed properly yesterday; discussed with LaToya and pt clincal reasoning for eval, will s/o)  Educated pt/daughter to s/s of dysphagia for which to be aware and to make MD aware if desire SLP evaluation.  RN made aware.  Maudie Sorrow, MS [facility] SLP Acute Rehab Services Office [phone]   Chantal Comment 03/31/2024, 8:11 AM

## 2024-04-01 ENCOUNTER — Other Ambulatory Visit: Payer: Self-pay | Admitting: Orthopedic Surgery

## 2024-04-01 DIAGNOSIS — S42412A Displaced simple supracondylar fracture without intercondylar fracture of left humerus, initial encounter for closed fracture: Secondary | ICD-10-CM | POA: Diagnosis not present

## 2024-04-01 LAB — CBC
HCT: 29 % — ABNORMAL LOW (ref 36.0–46.0)
Hemoglobin: 9.5 g/dL — ABNORMAL LOW (ref 12.0–15.0)
MCH: 30.8 pg (ref 26.0–34.0)
MCHC: 32.8 g/dL (ref 30.0–36.0)
MCV: 94.2 fL (ref 80.0–100.0)
Platelets: 196 10*3/uL (ref 150–400)
RBC: 3.08 MIL/uL — ABNORMAL LOW (ref 3.87–5.11)
RDW: 14.9 % (ref 11.5–15.5)
WBC: 9.6 10*3/uL (ref 4.0–10.5)
nRBC: 0 % (ref 0.0–0.2)

## 2024-04-01 LAB — GLUCOSE, CAPILLARY
Glucose-Capillary: 113 mg/dL — ABNORMAL HIGH (ref 70–99)
Glucose-Capillary: 203 mg/dL — ABNORMAL HIGH (ref 70–99)
Glucose-Capillary: 154 mg/dL — ABNORMAL HIGH (ref 70–99)
Glucose-Capillary: 144 mg/dL — ABNORMAL HIGH (ref 70–99)

## 2024-04-01 LAB — BASIC METABOLIC PANEL WITH GFR
Anion gap: 8 (ref 5–15)
BUN: 14 mg/dL (ref 8–23)
CO2: 23 mmol/L (ref 22–32)
Calcium: 8.5 mg/dL — ABNORMAL LOW (ref 8.9–10.3)
Chloride: 105 mmol/L (ref 98–111)
Creatinine, Ser: 0.8 mg/dL (ref 0.44–1.00)
GFR, Estimated: >60 mL/min (ref >60)
Glucose, Bld: 122 mg/dL — ABNORMAL HIGH (ref 70–99)
Potassium: 4.2 mmol/L (ref 3.5–5.1)
Sodium: 136 mmol/L (ref 135–145)

## 2024-04-01 MED ORDER — INSULIN ASPART 100 UNIT/ML IJ SOLN
0.0000 [IU] | Freq: Three times a day (TID) | INTRAMUSCULAR | Status: DC
  Administered 2024-04-01 – 2024-04-03 (×5): 2 [IU] via SUBCUTANEOUS
  Administered 2024-04-04 (×2): 1 [IU] via SUBCUTANEOUS
  Administered 2024-04-04 – 2024-04-05 (×2): 2 [IU] via SUBCUTANEOUS
  Administered 2024-04-05: 1 [IU] via SUBCUTANEOUS
  Administered 2024-04-05 – 2024-04-06 (×2): 2 [IU] via SUBCUTANEOUS
  Administered 2024-04-06: 1 [IU] via SUBCUTANEOUS

## 2024-04-01 MED ORDER — DOCUSATE SODIUM 100 MG PO CAPS
200.0000 mg | ORAL_CAPSULE | Freq: Two times a day (BID) | ORAL | Status: DC
  Administered 2024-04-01 – 2024-04-06 (×9): 200 mg via ORAL
  Filled 2024-04-01 (×11): qty 2

## 2024-04-01 MED ORDER — METHOCARBAMOL 500 MG PO TABS
500.0000 mg | ORAL_TABLET | Freq: Three times a day (TID) | ORAL | Status: DC
  Administered 2024-04-01 – 2024-04-06 (×16): 500 mg via ORAL
  Filled 2024-04-01 (×16): qty 1

## 2024-04-01 MED ORDER — OXYCODONE HCL ER 10 MG PO T12A
10.0000 mg | EXTENDED_RELEASE_TABLET | Freq: Two times a day (BID) | ORAL | Status: DC
  Administered 2024-04-01 – 2024-04-06 (×11): 10 mg via ORAL
  Filled 2024-04-01 (×11): qty 1

## 2024-04-01 MED ORDER — SENNOSIDES-DOCUSATE SODIUM 8.6-50 MG PO TABS
3.0000 | ORAL_TABLET | Freq: Every day | ORAL | Status: DC
  Administered 2024-04-01: 3 via ORAL
  Filled 2024-04-01 (×3): qty 3

## 2024-04-01 MED ORDER — INSULIN ASPART 100 UNIT/ML IJ SOLN
0.0000 [IU] | Freq: Every day | INTRAMUSCULAR | Status: DC
  Administered 2024-04-03: 2 [IU] via SUBCUTANEOUS

## 2024-04-01 NOTE — Evaluation (Signed)
 Physical Therapy Evaluation Patient Details Name: [name] [name] MRN: [id number] DOB: [birth year] Today's Date: 04/01/2024  History of Present Illness  83 y.o. female presents to [facility] hospital on 03/27/2024 after a fall onto left side. Imaging is notable for a L humeral fx. 5/14 MRI revealed L pubic rami fxs and L iliac fx. S/p L reverse shoulder replacement 5/15. PMH includes RA, DM II, CABG.   Clinical Impression  Pt presents with condition above and deficits mentioned below, see PT Problem List. PTA, she was independent without AD, living alone in a 1-level house with 5 STE. Currently, the pt is limited in mobility and L lower extremity and upper extremity ROM and strength due to pain and orthopedic restrictions. She is requiring maxA for bed mobility and progressed from modA to CGA for static sitting balance. She was able to sit EOB for >5 min today. Deferred OOB mobility due to severity of pain this date. Based on the pt's current activity tolerance and limited support at d/c, she could benefit from short-term inpatient rehab, < 3 hours/day, to address her deficits in ROM, strength, balance, power, and endurance to maximize her independence and return to PLOF. Will continue to follow acutely.      If plan is discharge home, recommend the following: Two people to help with walking and/or transfers;Two people to help with bathing/dressing/bathroom;Assistance with cooking/housework;Assist for transportation;Help with stairs or ramp for entrance   Can travel by private vehicle   No    Equipment Recommendations Wheelchair (measurements PT);Wheelchair cushion (measurements PT);Hoyer lift;Hospital bed  Recommendations for Other Services       Functional Status Assessment Patient has had a recent decline in their functional status and demonstrates the ability to make significant improvements in function in a reasonable and predictable amount of time.     Precautions / Restrictions  Precautions Precautions: Fall Recall of Precautions/Restrictions: Intact Required Braces or Orthoses: Sling Restrictions Weight Bearing Restrictions Per Provider Order: Yes LUE Weight Bearing Per Provider Order: Non weight bearing RLE Weight Bearing Per Provider Order: Weight bearing as tolerated LLE Weight Bearing Per Provider Order: Non weight bearing Other Position/Activity Restrictions: No ROM with the operative shoulder but okay for ROM of the elbow, hand, wrist      Mobility  Bed Mobility Overal bed mobility: Needs Assistance Bed Mobility: Supine to Sit, Sit to Supine     Supine to sit: HOB elevated, Used rails, Max assist Sit to supine: Max assist, HOB elevated, Used rails   General bed mobility comments: Assisted pt in moving each leg towards and off R EOB one at a time, needing more assistance at L leg than R. As her legs moved towards the edge she needed assistance to shift her shoulders to the contralateral side. Cues provided for pt to grab R rail with R hand to pull trunk  up to sit, assistance needed at trunk to ascend and hips to pivot to EOB. MaxA needed to lift bil legs simultaneously and pivot them onto bed with pt assisting in lowering her trunk.    Transfers                   General transfer comment: deferred due to pain just sitting EOB    Ambulation/Gait               General Gait Details: deferred  Stairs            Wheelchair Mobility     Tilt Bed  Modified Rankin (Stroke Patients Only)       Balance Overall balance assessment: Needs assistance Sitting-balance support: Single extremity supported, Feet unsupported Sitting balance-Leahy Scale: Poor Sitting balance - Comments: Min to mod assist with intermittent CGA for safety to maintain upright balance sitting sideways at EOB with feet unsupported and pt holding onto R rail with R UE       Standing balance comment: deferred due to pain                              Pertinent Vitals/Pain Pain Assessment Pain Assessment: Faces Faces Pain Scale: Hurts whole lot Pain Location: L pelvis, L shoulder Pain Descriptors / Indicators: Moaning, Guarding, Discomfort, Grimacing, Operative site guarding Pain Intervention(s): Limited activity within patient's tolerance, Premedicated before session, Monitored during session, Repositioned, Ice applied    Home Living Family/patient expects to be discharged to:: Private residence Living Arrangements: Alone Available Help at Discharge: Family;Available PRN/intermittently Type of Home: House Home Access: Stairs to enter Entrance Stairs-Rails: Left Entrance Stairs-Number of Steps: 5   Home Layout: One level;Laundry or work area in [employer] Equipment: Grab bars - Patent attorney (2 wheels);Cane - single point;Shower seat      Prior Function Prior Level of Function : Independent/Modified Independent             Mobility Comments: No AD       Extremity/Trunk Assessment   Upper Extremity Assessment Upper Extremity Assessment: Defer to OT evaluation    Lower Extremity Assessment Lower Extremity Assessment: LLE deficits/detail;Generalized weakness LLE Deficits / Details: noted decreased ankle dorsiflexion ROM, achieving neutral PROM; decreased hip ROM due to pain; MMT scores grossly </= 3-/5; edema and bruising noted in thigh and at knee    Cervical / Trunk Assessment Cervical / Trunk Assessment: Kyphotic  Communication   Communication Communication: No apparent difficulties    Cognition Arousal: Alert Behavior During Therapy: Anxious   PT - Cognitive impairments: No family/caregiver present to determine baseline                       PT - Cognition Comments: Slow to initiate, but likely internally distracted by pain. Anxious in regards to pain as well. Following commands: Impaired Following commands impaired: Follows one step commands with increased  time     Cueing Cueing Techniques: Verbal cues, Tactile cues     General Comments      Exercises General Exercises - Lower Extremity Ankle Circles/Pumps: AROM, Both, 5 reps, Supine Quad Sets: AROM, AAROM, Both, 5 reps, 10 reps, Supine (x5 AROM on R, x10 AAROM on L) Heel Slides: AROM, AAROM, Both, 5 reps, 10 reps, Supine (x5 AROM on R, x10 AAROM on L) Hip ABduction/ADduction: AAROM, AROM, Both, 5 reps, Supine (AROM on R, AAROM on L)   Assessment/Plan    PT Assessment Patient needs continued PT services  PT Problem List Decreased strength;Decreased range of motion;Decreased activity tolerance;Decreased balance;Decreased mobility;Pain       PT Treatment Interventions DME instruction;Gait training;Functional mobility training;Therapeutic activities;Therapeutic exercise;Balance training;Neuromuscular re-education;Patient/family education;Wheelchair mobility training    PT Goals (Current goals can be found in the Care Plan section)  Acute Rehab PT Goals Patient Stated Goal: to reduce pain PT Goal Formulation: With patient Time For Goal Achievement: 04/15/24 Potential to Achieve Goals: Good    Frequency Min 2X/week     Co-evaluation  AM-PAC PT "6 Clicks" Mobility  Outcome Measure Help needed turning from your back to your side while in a flat bed without using bedrails?: A Lot Help needed moving from lying on your back to sitting on the side of a flat bed without using bedrails?: A Lot Help needed moving to and from a bed to a chair (including a wheelchair)?: Total Help needed standing up from a chair using your arms (e.g., wheelchair or bedside chair)?: Total Help needed to walk in hospital room?: Total Help needed climbing 3-5 steps with a railing? : Total 6 Click Score: 8    End of Session   Activity Tolerance: Patient limited by pain Patient left: in bed;with call bell/phone within reach;with bed alarm set Nurse Communication: Mobility status;Other  (comment) (need for prevalon boots) PT Visit Diagnosis: Difficulty in walking, not elsewhere classified (R26.2);Pain;Muscle weakness (generalized) (M62.81) Pain - Right/Left: Left Pain - part of body: Shoulder;Hip;Leg    Time: 8295-6213 PT Time Calculation (min) (ACUTE ONLY): 23 min   Charges:   PT Evaluation $PT Eval Moderate Complexity: 1 Mod PT Treatments $Therapeutic Exercise: 8-22 mins PT General Charges $$ ACUTE PT VISIT: 1 Visit         Vernida Goodie, PT, DPT Acute Rehabilitation Services  Office: [phone]   Ellyn Hack 04/01/2024, 4:58 PM

## 2024-04-01 NOTE — Progress Notes (Signed)
  Subjective: Patient stable.  Having more hip pain and left shoulder pain.  The block has worn off and motion of the shoulder is painful.   Objective: Vital signs in last 24 hours: Temp:  [98.2 F (36.8 C)-98.5 F (36.9 C)] 98.2 F (36.8 C) (05/17 0513) Pulse Rate:  [56-74] 74 (05/17 0513) Resp:  [16] 16 (05/17 0513) BP: (110-137)/(50-53) 135/51 (05/17 0513) SpO2:  [91 %-100 %] 91 % (05/17 0513)  Intake/Output from previous day: No intake/output data recorded. Intake/Output this shift: No intake/output data recorded.  Exam:  No cellulitis present Compartment soft  Labs: Recent Labs    03/30/24 1259 03/31/24 0457 04/01/24 0525  HGB 10.0* 7.8* 9.5*   Recent Labs    03/31/24 0457 04/01/24 0525  WBC 9.2 9.6  RBC 2.56* 3.08*  HCT 24.4* 29.0*  PLT 159 196   Recent Labs    03/31/24 0457 04/01/24 0525  NA 137 136  K 4.2 4.2  CL 108 105  CO2 23 23  BUN 13 14  CREATININE 0.95 0.80  GLUCOSE 214* 122*  CALCIUM  8.3* 8.5*   No results for input(s): "LABPT", "INR" in the last 72 hours.  Assessment/Plan: Plan at this time is potentially start CPM machine on that left leg on Monday.  I think a little bit of passive motion could help to reset up.  Left shoulder does have painful motion but her EPL FPL interosseous intact.  Hand is perfused.  I also want to start doing some shoulder range of motion on Wednesday of next week.  Overall it would likely be about 2 to 3 weeks before she can really mobilize.  Anticipate discharge to skilled nursing prior to that.  Hematocrit a little lower today at 24.   Clerance Dais Gino Garrabrant 04/01/2024, 7:55 AM

## 2024-04-01 NOTE — Anesthesia Postprocedure Evaluation (Signed)
 Anesthesia Post Note  Patient: [name] [name]  Procedure(s) Performed: ARTHROPLASTY, SHOULDER, TOTAL, REVERSE (Left: Shoulder)     Patient location during evaluation: PACU Anesthesia Type: General Level of consciousness: awake and alert Pain management: pain level controlled Vital Signs Assessment: post-procedure vital signs reviewed and stable Respiratory status: spontaneous breathing, nonlabored ventilation, respiratory function stable and patient connected to nasal cannula oxygen Cardiovascular status: blood pressure returned to baseline and stable Postop Assessment: no apparent nausea or vomiting Anesthetic complications: no   No notable events documented.  Last Vitals:  Vitals:   03/31/24 2014 03/31/24 2215  BP: (!) 121/53 (!) 137/50  Pulse: (!) 59 64  Resp: 16   Temp: 36.9 C   SpO2: 100%     Last Pain:  Vitals:   03/31/24 2226  TempSrc:   PainSc: 6    Pain Goal: Patients Stated Pain Goal: 2 (03/31/24 0154)                 Melvenia Stabs

## 2024-04-01 NOTE — Progress Notes (Signed)
 PROGRESS NOTE    [name] [name]  JYN:[id number] DOB: [birth year] DOA: 03/27/2024 PCP: Pete Brand, DO   Brief Narrative:   83 y.o. female with medical history significant of RA on methotrexate , type II DM, CAD status post CABG, brought to the emergency department for evaluation after mechanical fall.  She reports left arm and left hip pain and subsequent decreased range of motion of the left upper and lower extremities.  No paresthesias or change in sensation. She was diagnosed with zoster and started acyclovir prior to the fall.  She reports the symptoms are improving. She is not on any anticoagulants.  ED workup significant for left humeral fracture.  She was hospitalized for further management.  Assessment & Plan:   Principal Problem:   Left supracondylar humerus fracture, closed, initial encounter Active Problems:   Carotid artery disease (HCC)   CAD (coronary artery disease)   Rheumatoid arthritis (HCC)   Insulin  dependent type 2 diabetes mellitus (HCC)  Left humerus fracture-status post left shoulder reverse arthroplasty Dr. Rozelle Corning Mar 30, 2024.  Ortho had consulted vascular due to asymmetric radial pulses on the left compared to the right.  Patient with history of prior CABG and radial A-line. DVT prophylaxis with aspirin  and Plavix  and SCDs per Ortho    A CT angiogram of the left upper extremity was done-  No evidence of acute vascular injury at the level of the known left humeral head and neck fracture.Nonvisualization of the radial artery from the proximal forearm through the wrist, of uncertain acuity. The origin of the radial artery opacifies normally, and there is reconstitution of the radial artery at the level of the wrist.Diminutive but patent radial artery is visualized throughout its course.  Comminuted left humeral head and neck fracture not appreciably changed since prior study. Severe inferior subluxation of the humeral head within the glenoid fossa. Persistent  soft tissue edema surrounding the left shoulder and extending into the proximal upper arm. No fluid collection or hematoma.  Pelvic fracture present on admission- MRI pelvis Acute to subacute nondisplaced fractures of the left pubic body and left inferior pubic ramus. Acute to subacute nondisplaced vertically oriented fracture of the medial aspect of the left iliac bone near the left sacroiliac joint. This does not appear to extend into the sacroiliac joint. Moderate edema throughout the left adductor muscles originating off of the left pubic body and inferior left pubic ramus. Mild edema within the contralateral right groin musculature. Moderate to high-grade anterior superior left femoral head and acetabular cartilage thinning.   Herpes zoster right chest/flank area Recently diagnosed.  Started on Valtrex .  Contact precautions for now.   History of rheumatoid arthritis On methotrexate  at home which is currently on hold.   Coronary artery disease She is status post PCI and CABG.  Last PCI was in 2023.  Drug-eluting stent was placed in mid LAD at that time. Continue Aspirin  and Plavix     Continue with metoprolol . ARB is on hold.(Soft bp)   Normocytic anemia stable hb 7.8 from 10.3 No signs of active bleed Follow in am   Diabetes mellitus type 2 On SSI. Prior to admission patient was on 75/25 insulin . HbA1c  7.2 CBG (last 3)  Recent Labs    03/31/24 2116 04/01/24 0650 04/01/24 1137  GLUCAP 345* 113* 203*   Constipation- stool softners laxatives  Estimated body mass index is 24.92 kg/m as calculated from the following:   Height as of this encounter: 5' 1.5" (1.562 m).   Weight as  of this encounter: 60.8 kg.  DVT prophylaxis scd Code Status: full Family Communication:daughter at bed side Disposition Plan:  Status is: Inpatient Remains inpatient appropriate because: acute humerus fracture and pelvic fx   Consultants:  Ortho Vascular  Procedures: left shoulder  reverse arthroplasty Antimicrobials:  None Subjective:  No BM since admission to hospital   Objective: Vitals:   03/31/24 2014 03/31/24 2215 04/01/24 0513 04/01/24 0851  BP: (!) 121/53 (!) 137/50 (!) 135/51 (!) 122/50  Pulse: (!) 59 64 74 68  Resp: 16  16 18   Temp: 98.5 F (36.9 C)  98.2 F (36.8 C) 98.5 F (36.9 C)  TempSrc: Oral   Oral  SpO2: 100%  91% 91%  Weight:      Height:        Intake/Output Summary (Last 24 hours) at 04/01/2024 1418 Last data filed at 04/01/2024 1004 Gross per 24 hour  Intake --  Output 600 ml  Net -600 ml   Filed Weights   03/27/24 1506 03/27/24 1552  Weight: 60.8 kg 60.8 kg    Examination:  General exam: Appears in mild distress  respiratory system: Clear to auscultation. Respiratory effort normal. Cardiovascular system: S1 & S2 heard, RRR. No JVD, murmurs, rubs, gallops or clicks. No pedal edema. Gastrointestinal system: Abdomen is nondistended, soft and nontender. No organomegaly or masses felt. Normal bowel sounds heard. Central nervous system: Alert and oriented. No focal neurological deficits. Extremities: Trace edema left upper extremity with decreased range of motion  Data Reviewed: I have personally reviewed following labs and imaging studies  CBC: Recent Labs  Lab 03/28/24 1212 03/29/24 0641 03/30/24 1259 03/31/24 0457 04/01/24 0525  WBC 7.2 7.6 10.4 9.2 9.6  HGB 10.0* 10.3* 10.0* 7.8* 9.5*  HCT 29.8* 31.1* 30.7* 24.4* 29.0*  MCV 93.1 92.8 94.5 95.3 94.2  PLT 171 170 161 159 196   Basic Metabolic Panel: Recent Labs  Lab 03/28/24 0924 03/29/24 0641 03/30/24 1259 03/31/24 0457 04/01/24 0525  NA 137 134* 139 137 136  K 4.0 4.1 3.9 4.2 4.2  CL 110 105 107 108 105  CO2 17* 22 19* 23 23  GLUCOSE 160* 131* 183* 214* 122*  BUN 12 10 10 13 14   CREATININE 0.89 0.84 0.90 0.95 0.80  CALCIUM  8.8* 8.6* 8.7* 8.3* 8.5*  MG  --  1.9  --   --   --    GFR: Estimated Creatinine Clearance: 45.2 mL/min (by C-G formula based  on SCr of 0.8 mg/dL). Liver Function Tests: No results for input(s): "AST", "ALT", "ALKPHOS", "BILITOT", "PROT", "ALBUMIN " in the last 168 hours. No results for input(s): "LIPASE", "AMYLASE" in the last 168 hours. No results for input(s): "AMMONIA" in the last 168 hours. Coagulation Profile: Recent Labs  Lab 03/27/24 2259  INR 1.2   Cardiac Enzymes: No results for input(s): "CKTOTAL", "CKMB", "CKMBINDEX", "TROPONINI" in the last 168 hours. BNP (last 3 results) No results for input(s): "PROBNP" in the last 8760 hours. HbA1C: No results for input(s): "HGBA1C" in the last 72 hours.  CBG: Recent Labs  Lab 03/31/24 0623 03/31/24 1641 03/31/24 2116 04/01/24 0650 04/01/24 1137  GLUCAP 165* 234* 345* 113* 203*   Lipid Profile: No results for input(s): "CHOL", "HDL", "LDLCALC", "TRIG", "CHOLHDL", "LDLDIRECT" in the last 72 hours. Thyroid  Function Tests: No results for input(s): "TSH", "T4TOTAL", "FREET4", "T3FREE", "THYROIDAB" in the last 72 hours. Anemia Panel: No results for input(s): "VITAMINB12", "FOLATE", "FERRITIN", "TIBC", "IRON", "RETICCTPCT" in the last 72 hours. Sepsis Labs: No results for  input(s): "PROCALCITON", "LATICACIDVEN" in the last 168 hours.  Recent Results (from the past 240 hours)  Surgical pcr screen     Status: None   Collection Time: 03/28/24 10:00 PM   Specimen: Nasal Mucosa; Nasal Swab  Result Value Ref Range Status   MRSA, PCR NEGATIVE NEGATIVE Final   Staphylococcus aureus NEGATIVE NEGATIVE Final    Comment: (NOTE) The Xpert SA Assay (FDA approved for NASAL specimens in patients 60 years of age and older), is one component of a comprehensive surveillance program. It is not intended to diagnose infection nor to guide or monitor treatment. Performed at [facility], [address], [place], [place] [address]   Urine Culture     Status: None   Collection Time: 03/30/24  8:36 AM   Specimen: Urine, Catheterized  Result Value Ref Range Status    Specimen Description URINE, CATHETERIZED  Final   Special Requests NONE  Final   Culture   Final    NO GROWTH Performed at [facility], [address] [place], [place] [address]    Report Status 03/31/2024 FINAL  Final     Radiology Studies: No results found.   Scheduled Meds:  aspirin  EC  81 mg Oral Daily   bisacodyl   10 mg Oral Q0200   cholecalciferol   2,000 Units Oral Daily   clopidogrel   75 mg Oral Q breakfast   docusate sodium   200 mg Oral BID   folic acid   1 mg Oral QPM   insulin  aspart  0-5 Units Subcutaneous QHS   insulin  aspart  0-9 Units Subcutaneous TID WC   isosorbide  mononitrate  60 mg Oral Daily   methocarbamol   500 mg Oral TID   metoprolol  tartrate  25 mg Oral BID   oxyCODONE   10 mg Oral Q12H   pantoprazole   40 mg Oral Daily   polyethylene glycol  17 g Oral BID   senna-docusate  3 tablet Oral QHS   valACYclovir   1,000 mg Oral Q12H   Continuous Infusions:   LOS: 5 days    Time spent: 40 min  [name] Lew, MD 04/01/2024, 2:18 PM

## 2024-04-02 DIAGNOSIS — S42202D Unspecified fracture of upper end of left humerus, subsequent encounter for fracture with routine healing: Secondary | ICD-10-CM

## 2024-04-02 DIAGNOSIS — S42412A Displaced simple supracondylar fracture without intercondylar fracture of left humerus, initial encounter for closed fracture: Secondary | ICD-10-CM | POA: Diagnosis not present

## 2024-04-02 LAB — GLUCOSE, CAPILLARY
Glucose-Capillary: 167 mg/dL — ABNORMAL HIGH (ref 70–99)
Glucose-Capillary: 86 mg/dL (ref 70–99)
Glucose-Capillary: 136 mg/dL — ABNORMAL HIGH (ref 70–99)
Glucose-Capillary: 156 mg/dL — ABNORMAL HIGH (ref 70–99)
Glucose-Capillary: 127 mg/dL — ABNORMAL HIGH (ref 70–99)

## 2024-04-02 LAB — CBC
HCT: 28.2 % — ABNORMAL LOW (ref 36.0–46.0)
Hemoglobin: 9.2 g/dL — ABNORMAL LOW (ref 12.0–15.0)
MCH: 30.9 pg (ref 26.0–34.0)
MCHC: 32.6 g/dL (ref 30.0–36.0)
MCV: 94.6 fL (ref 80.0–100.0)
Platelets: 213 10*3/uL (ref 150–400)
RBC: 2.98 MIL/uL — ABNORMAL LOW (ref 3.87–5.11)
RDW: 14.6 % (ref 11.5–15.5)
WBC: 8.9 10*3/uL (ref 4.0–10.5)
nRBC: 0 % (ref 0.0–0.2)

## 2024-04-02 LAB — BASIC METABOLIC PANEL WITH GFR
Anion gap: 9 (ref 5–15)
BUN: 9 mg/dL (ref 8–23)
CO2: 22 mmol/L (ref 22–32)
Calcium: 8.3 mg/dL — ABNORMAL LOW (ref 8.9–10.3)
Chloride: 102 mmol/L (ref 98–111)
Creatinine, Ser: 0.79 mg/dL (ref 0.44–1.00)
GFR, Estimated: >60 mL/min (ref >60)
Glucose, Bld: 129 mg/dL — ABNORMAL HIGH (ref 70–99)
Potassium: 4.2 mmol/L (ref 3.5–5.1)
Sodium: 133 mmol/L — ABNORMAL LOW (ref 135–145)

## 2024-04-02 MED ORDER — BISACODYL 10 MG RE SUPP: 10.0000 mg | Freq: Once | RECTAL | Status: DC

## 2024-04-02 NOTE — Plan of Care (Signed)
  Problem: Elimination: Goal: Will not experience complications related to bowel motility Outcome: Progressing Goal: Will not experience complications related to urinary retention Outcome: Progressing   Problem: Pain Managment: Goal: General experience of comfort will improve and/or be controlled Outcome: Progressing

## 2024-04-02 NOTE — Clinical Note (Incomplete)
 While cleaning pt up, pt began to insult RN and CNA. RN notes pt becoming more verbal when its time to turn stating "I thought you were a good nurse and aide". RN stopped

## 2024-04-02 NOTE — Progress Notes (Signed)
 PROGRESS NOTE    [name] [name]  WUJ:[id number] DOB: 18-Jul-1941 DOA: 03/27/2024 PCP: Pete Brand, DO   Brief Narrative:   83 y.o. female with medical history significant of RA on methotrexate , type II DM, CAD status post CABG, brought to the emergency department for evaluation after mechanical fall.  She reports left arm and left hip pain and subsequent decreased range of motion of the left upper and lower extremities.  No paresthesias or change in sensation. She was diagnosed with zoster and started acyclovir prior to the fall.  She reports the symptoms are improving. She is not on any anticoagulants.  ED workup significant for left humeral fracture.  She was hospitalized for further management.  Assessment & Plan:   Principal Problem:   Left supracondylar humerus fracture, closed, initial encounter Active Problems:   Carotid artery disease (HCC)   CAD (coronary artery disease)   Rheumatoid arthritis (HCC)   Insulin  dependent type 2 diabetes mellitus (HCC)  Left humerus fracture-status post left shoulder reverse arthroplasty Dr. Rozelle Corning Mar 30, 2024.  Ortho had consulted vascular due to asymmetric radial pulses on the left compared to the right.  Patient with history of prior CABG and radial A-line. DVT prophylaxis with aspirin  and Plavix  and SCDs per Ortho    A CT angiogram of the left upper extremity was done-  No evidence of acute vascular injury at the level of the known left humeral head and neck fracture.Nonvisualization of the radial artery from the proximal forearm through the wrist, of uncertain acuity. The origin of the radial artery opacifies normally, and there is reconstitution of the radial artery at the level of the wrist.Diminutive but patent radial artery is visualized throughout its course.  Comminuted left humeral head and neck fracture not appreciably changed since prior study. Severe inferior subluxation of the humeral head within the glenoid fossa. Persistent  soft tissue edema surrounding the left shoulder and extending into the proximal upper arm. No fluid collection or hematoma.  Pelvic fracture present on admission- MRI pelvis Acute to subacute nondisplaced fractures of the left pubic body and left inferior pubic ramus. Acute to subacute nondisplaced vertically oriented fracture of the medial aspect of the left iliac bone near the left sacroiliac joint. This does not appear to extend into the sacroiliac joint. Moderate edema throughout the left adductor muscles originating off of the left pubic body and inferior left pubic ramus. Mild edema within the contralateral right groin musculature. Moderate to high-grade anterior superior left femoral head and acetabular cartilage thinning.   Herpes zoster right chest/flank area Recently diagnosed.  Started on Valtrex .  Contact precautions for now.   History of rheumatoid arthritis On methotrexate  at home which is currently on hold.   Coronary artery disease She is status post PCI and CABG.  Last PCI was in 2023.  Drug-eluting stent was placed in mid LAD at that time. Continue Aspirin  and Plavix     Continue with metoprolol . ARB is on hold.(Soft bp)   Normocytic anemia stable hb 7.8 from 10.3 No signs of active bleed Follow in am   Diabetes mellitus type 2 On SSI. Prior to admission patient was on 75/25 insulin . HbA1c  7.2 CBG (last 3)  Recent Labs    04/02/24 0629 04/02/24 1036 04/02/24 1130  GLUCAP 167* 86 136*   Constipation- stool softners laxatives Dulcolax supp Estimated body mass index is 24.92 kg/m as calculated from the following:   Height as of this encounter: 5' 1.5" (1.562 m).   Weight  as of this encounter: 60.8 kg.  DVT prophylaxis scd Code Status: full Family Communication:daughter at bed side Disposition Plan:  Status is: Inpatient Remains inpatient appropriate because: acute humerus fracture and pelvic fx   Consultants:  Ortho Vascular  Procedures: left  shoulder reverse arthroplasty Antimicrobials:  None Subjective: Still has not had a bowel movement Still with a lot of pain  Objective: Vitals:   04/01/24 2109 04/01/24 2146 04/02/24 0510 04/02/24 0851  BP: (!) 146/55 (!) 146/55 (!) 121/46 (!) 119/49  Pulse: 72 72 (!) 57 67  Resp: 16  16 18   Temp: 98.9 F (37.2 C)  98.5 F (36.9 C) 98.2 F (36.8 C)  TempSrc: Oral  Oral Oral  SpO2: 100%  99% 100%  Weight:      Height:        Intake/Output Summary (Last 24 hours) at 04/02/2024 1429 Last data filed at 04/02/2024 0600 Gross per 24 hour  Intake --  Output 300 ml  Net -300 ml   Filed Weights   03/27/24 1506 03/27/24 1552  Weight: 60.8 kg 60.8 kg    Examination:  General exam: Appears in mild distress  respiratory system: Clear to auscultation. Respiratory effort normal. Cardiovascular system: S1 & S2 heard, RRR. No JVD, murmurs, rubs, gallops or clicks. Gastrointestinal system: Abdomen is nondistended, soft and nontender. No organomegaly or masses felt. Normal bowel sounds heard. Central nervous system: Alert and oriented. No focal neurological deficits. Extremities: Trace edema left upper extremity with decreased range of motion  Data Reviewed: I have personally reviewed following labs and imaging studies  CBC: Recent Labs  Lab 03/29/24 0641 03/30/24 1259 03/31/24 0457 04/01/24 0525 04/02/24 0953  WBC 7.6 10.4 9.2 9.6 8.9  HGB 10.3* 10.0* 7.8* 9.5* 9.2*  HCT 31.1* 30.7* 24.4* 29.0* 28.2*  MCV 92.8 94.5 95.3 94.2 94.6  PLT 170 161 159 196 213   Basic Metabolic Panel: Recent Labs  Lab 03/29/24 0641 03/30/24 1259 03/31/24 0457 04/01/24 0525 04/02/24 0953  NA 134* 139 137 136 133*  K 4.1 3.9 4.2 4.2 4.2  CL 105 107 108 105 102  CO2 22 19* 23 23 22   GLUCOSE 131* 183* 214* 122* 129*  BUN 10 10 13 14 9   CREATININE 0.84 0.90 0.95 0.80 0.79  CALCIUM  8.6* 8.7* 8.3* 8.5* 8.3*  MG 1.9  --   --   --   --    GFR: Estimated Creatinine Clearance: 45.2 mL/min  (by C-G formula based on SCr of 0.79 mg/dL). Liver Function Tests: No results for input(s): "AST", "ALT", "ALKPHOS", "BILITOT", "PROT", "ALBUMIN " in the last 168 hours. No results for input(s): "LIPASE", "AMYLASE" in the last 168 hours. No results for input(s): "AMMONIA" in the last 168 hours. Coagulation Profile: Recent Labs  Lab 03/27/24 2259  INR 1.2   Cardiac Enzymes: No results for input(s): "CKTOTAL", "CKMB", "CKMBINDEX", "TROPONINI" in the last 168 hours. BNP (last 3 results) No results for input(s): "PROBNP" in the last 8760 hours. HbA1C: No results for input(s): "HGBA1C" in the last 72 hours.  CBG: Recent Labs  Lab 04/01/24 1715 04/01/24 2127 04/02/24 0629 04/02/24 1036 04/02/24 1130  GLUCAP 154* 144* 167* 86 136*   Lipid Profile: No results for input(s): "CHOL", "HDL", "LDLCALC", "TRIG", "CHOLHDL", "LDLDIRECT" in the last 72 hours. Thyroid  Function Tests: No results for input(s): "TSH", "T4TOTAL", "FREET4", "T3FREE", "THYROIDAB" in the last 72 hours. Anemia Panel: No results for input(s): "VITAMINB12", "FOLATE", "FERRITIN", "TIBC", "IRON", "RETICCTPCT" in the last 72 hours. Sepsis Labs:  No results for input(s): "PROCALCITON", "LATICACIDVEN" in the last 168 hours.  Recent Results (from the past 240 hours)  Surgical pcr screen     Status: None   Collection Time: 03/28/24 10:00 PM   Specimen: Nasal Mucosa; Nasal Swab  Result Value Ref Range Status   MRSA, PCR NEGATIVE NEGATIVE Final   Staphylococcus aureus NEGATIVE NEGATIVE Final    Comment: (NOTE) The Xpert SA Assay (FDA approved for NASAL specimens in patients 20 years of age and older), is one component of a comprehensive surveillance program. It is not intended to diagnose infection nor to guide or monitor treatment. Performed at [facility], [address], [place], [place] [address]   Urine Culture     Status: None   Collection Time: 03/30/24  8:36 AM   Specimen: Urine, Catheterized  Result  Value Ref Range Status   Specimen Description URINE, CATHETERIZED  Final   Special Requests NONE  Final   Culture   Final    NO GROWTH Performed at [facility], [address], [place], [place] [address]    Report Status 03/31/2024 FINAL  Final     Radiology Studies: No results found.   Scheduled Meds:  aspirin  EC  81 mg Oral Daily   bisacodyl   10 mg Oral Q0200   bisacodyl   10 mg Rectal Once   cholecalciferol   2,000 Units Oral Daily   clopidogrel   75 mg Oral Q breakfast   docusate sodium   200 mg Oral BID   folic acid   1 mg Oral QPM   insulin  aspart  0-5 Units Subcutaneous QHS   insulin  aspart  0-9 Units Subcutaneous TID WC   isosorbide  mononitrate  60 mg Oral Daily   methocarbamol   500 mg Oral TID   metoprolol  tartrate  25 mg Oral BID   oxyCODONE   10 mg Oral Q12H   pantoprazole   40 mg Oral Daily   polyethylene glycol  17 g Oral BID   senna-docusate  3 tablet Oral QHS   valACYclovir   1,000 mg Oral Q12H   Continuous Infusions:   LOS: 6 days    Time spent: 40 min  Barbee Lew, MD 04/02/2024, 2:29 PM

## 2024-04-03 DIAGNOSIS — S42412A Displaced simple supracondylar fracture without intercondylar fracture of left humerus, initial encounter for closed fracture: Secondary | ICD-10-CM | POA: Diagnosis not present

## 2024-04-03 LAB — GLUCOSE, CAPILLARY
Glucose-Capillary: 114 mg/dL — ABNORMAL HIGH (ref 70–99)
Glucose-Capillary: 194 mg/dL — ABNORMAL HIGH (ref 70–99)
Glucose-Capillary: 177 mg/dL — ABNORMAL HIGH (ref 70–99)
Glucose-Capillary: 225 mg/dL — ABNORMAL HIGH (ref 70–99)

## 2024-04-03 MED ORDER — LIDOCAINE 5 % EX PTCH
1.0000 | MEDICATED_PATCH | CUTANEOUS | Status: DC
  Administered 2024-04-03 – 2024-04-06 (×2): 1 via TRANSDERMAL
  Filled 2024-04-03 (×2): qty 1

## 2024-04-03 NOTE — Progress Notes (Signed)
 Occupational Therapy Treatment Patient Details Name: [name] [name] MRN: [id number] DOB: 1941-09-07 Today's Date: 04/03/2024   History of present illness 83 y.o. female presents to [facility] hospital on 03/27/2024 after a fall onto left side. Imaging is notable for a L humeral fx. 5/14 MRI revealed L pubic rami fxs and L iliac fx. S/p L reverse shoulder replacement 5/15. PMH includes RA, DM II, CABG.   OT comments  This 83 yo female admitted and underwent above presents to acute OT with pt being seen today for LUE ROM and family education for this. Pt will continue to benefit from acute OT with follow up from continued inpatient follow up therapy, <3 hours/day.       If plan is discharge home, recommend the following:  A lot of help with walking and/or transfers;A lot of help with bathing/dressing/bathroom;Assistance with feeding;Assist for transportation;Assistance with cooking/housework   Equipment Recommendations  Other (comment) (TBD next vneue)       Precautions / Restrictions Precautions Precautions: Fall;Shoulder Type of Shoulder Precautions: NO AROM/PROM of shoulder; elbow, wrist, hand ROM okay Shoulder Interventions: Shoulder sling/immobilizer Recall of Precautions/Restrictions: Intact Required Braces or Orthoses: Sling Restrictions Weight Bearing Restrictions Per Provider Order: Yes LUE Weight Bearing Per Provider Order: Non weight bearing RLE Weight Bearing Per Provider Order: Weight bearing as tolerated LLE Weight Bearing Per Provider Order: Non weight bearing Other Position/Activity Restrictions: No ROM with the operative shoulder but okay for ROM of the elbow, hand, wrist              ADL either performed or assessed with clinical judgement   ADL                                         General ADL Comments: Pt asking if there was any way she could get on bedpan instead of rolling, so I had her bend up her RLE and try to do a modified bridge and she  was able to do this enough to get a fracture pan under her--made RN and NT aware through secure chat and made the patient aware she can advocate for herself as well.    Extremity/Trunk Assessment Upper Extremity Assessment Upper Extremity Assessment: Right hand dominant;LUE deficits/detail LUE Deficits / Details: S/P L reverse shoulder replacement, slight edema in hand decreased grip strength, full digit flexion LUE Coordination: decreased gross motor                     Communication Communication Communication: No apparent difficulties   Cognition Arousal: Alert Behavior During Therapy: Anxious Cognition: No apparent impairments                               Following commands: Impaired Following commands impaired: Follows one step commands with increased time      Cueing   Cueing Techniques: Verbal cues, Tactile cues  Exercises Other Exercises Other Exercises: Supine in bed pt completed 10 reps of AAROM elbow flexon/extension; AROM forearm supination/pronation, AROM wrist flexion/extension, composite finger flexion/extension.. Dtr in room and aware pt needs to do exercises 3x/day 10 reps each.            Pertinent Vitals/ Pain       Pain Assessment Pain Assessment: Faces Faces Pain Scale: Hurts a little bit Pain Location: L pelvis, L shoulder with minimal  movement in bed Pain Descriptors / Indicators: Guarding, Grimacing, Sore Pain Intervention(s): Limited activity within patient's tolerance, Monitored during session, Repositioned, Ice applied         Frequency  Min 2X/week        Progress Toward Goals  OT Goals(current goals can now be found in the care plan section)  Progress towards OT goals: Progressing toward goals  Acute Rehab OT Goals Patient Stated Goal: agreeable to LUE elbow to hand exercises OT Goal Formulation: With patient Time For Goal Achievement: 04/14/24 Potential to Achieve Goals: Good         AM-PAC OT "6 Clicks"  Daily Activity     Outcome Measure   Help from another person eating meals?: A Little Help from another person taking care of personal grooming?: A Little Help from another person toileting, which includes using toliet, bedpan, or urinal?: Total Help from another person bathing (including washing, rinsing, drying)?: A Lot Help from another person to put on and taking off regular upper body clothing?: Total Help from another person to put on and taking off regular lower body clothing?: Total 6 Click Score: 11    End of Session Equipment Utilized During Treatment: Other (comment) (sling)  OT Visit Diagnosis: Unsteadiness on feet (R26.81);Other abnormalities of gait and mobility (R26.89);Feeding difficulties (R63.3);Pain Pain - Right/Left: Left Pain - part of body: Shoulder;Leg   Activity Tolerance Patient tolerated treatment well   Patient Left in bed;with call bell/phone within reach;with family/visitor present;with bed alarm set   Nurse Communication  (toleting requests by pt)        Time: 5366-4403 OT Time Calculation (min): 30 min  Charges: OT General Charges $OT Visit: 1 Visit OT Evaluation $OT Eval Moderate Complexity: 1 Mod OT Treatments $Therapeutic Exercise: 23-37 mins  Merryl Abraham OT Acute Rehabilitation Services Office [phone]    Lenox Raider 04/03/2024, 1:10 PM

## 2024-04-03 NOTE — Plan of Care (Signed)
  Problem: Pain Management: Goal: Pain level will decrease with appropriate interventions Outcome: Not Progressing   Problem: Tissue Perfusion: Goal: Adequacy of tissue perfusion will improve Outcome: Not Progressing   Problem: Activity: Goal: Ability to tolerate increased activity will improve Outcome: Not Progressing   Problem: Metabolic: Goal: Ability to maintain appropriate glucose levels will improve Outcome: Not Progressing

## 2024-04-03 NOTE — Progress Notes (Signed)
 PROGRESS NOTE    [name] [name]  AOZ:[id number] DOB: 1941/03/05 DOA: 03/27/2024 PCP: Pete Brand, DO   Brief Narrative:   83 y.o. female with medical history significant of RA on methotrexate , type II DM, CAD status post CABG, brought to the emergency department for evaluation after mechanical fall.  She reports left arm and left hip pain and subsequent decreased range of motion of the left upper and lower extremities.  No paresthesias or change in sensation. She was diagnosed with zoster and started acyclovir prior to the fall.  She reports the symptoms are improving. She is not on any anticoagulants.  ED workup significant for left humeral fracture.  She was hospitalized for further management.  Assessment & Plan:   Principal Problem:   Left supracondylar humerus fracture, closed, initial encounter Active Problems:   Carotid artery disease (HCC)   CAD (coronary artery disease)   Rheumatoid arthritis (HCC)   Insulin  dependent type 2 diabetes mellitus (HCC)   Closed fracture of proximal end of left humerus with routine healing  Left humerus fracture-status post left shoulder reverse arthroplasty Dr. Rozelle Corning Mar 30, 2024.  Ortho had consulted vascular due to asymmetric radial pulses on the left compared to the right.  Patient with history of prior CABG and radial A-line. DVT prophylaxis with aspirin  and Plavix  and SCDs per Ortho    A CT angiogram of the left upper extremity was done-  No evidence of acute vascular injury at the level of the known left humeral head and neck fracture.Nonvisualization of the radial artery from the proximal forearm through the wrist, of uncertain acuity. The origin of the radial artery opacifies normally, and there is reconstitution of the radial artery at the level of the wrist.Diminutive but patent radial artery is visualized throughout its course.  Comminuted left humeral head and neck fracture not appreciably changed since prior study. Severe inferior  subluxation of the humeral head within the glenoid fossa. Persistent soft tissue edema surrounding the left shoulder and extending into the proximal upper arm. No fluid collection or hematoma.  Pelvic fracture present on admission- MRI pelvis Acute to subacute nondisplaced fractures of the left pubic body and left inferior pubic ramus. Acute to subacute nondisplaced vertically oriented fracture of the medial aspect of the left iliac bone near the left sacroiliac joint. This does not appear to extend into the sacroiliac joint. Moderate edema throughout the left adductor muscles originating off of the left pubic body and inferior left pubic ramus. Mild edema within the contralateral right groin musculature. Moderate to high-grade anterior superior left femoral head and acetabular cartilage thinning.   Herpes zoster right chest/flank area Recently diagnosed.  Started on Valtrex .  Contact precautions for now.   History of rheumatoid arthritis On methotrexate  at home which is currently on hold.   Coronary artery disease She is status post PCI and CABG.  Last PCI was in 2023.  Drug-eluting stent was placed in mid LAD at that time. Continue Aspirin  and Plavix     Continue with metoprolol . ARB is on hold.(Soft bp)   Normocytic anemia stable hb 7.8 from 10.3 No signs of active bleed Follow in am   Diabetes mellitus type 2 On SSI. Prior to admission patient was on 75/25 insulin . HbA1c  7.2 CBG (last 3)  Recent Labs    04/02/24 2135 04/03/24 0620 04/03/24 1218  GLUCAP 127* 114* 194*   Constipation- stool softners laxatives Dulcolax supp Estimated body mass index is 24.92 kg/m as calculated from the following:  Height as of this encounter: 5' 1.5" (1.562 m).   Weight as of this encounter: 60.8 kg.  DVT prophylaxis scd Code Status: full Family Communication:daughter at bed side Disposition Plan:  Status is: Inpatient Remains inpatient appropriate because: acute humerus  fracture and pelvic fx   Consultants:  Ortho Vascular  Procedures: left shoulder reverse arthroplasty Antimicrobials:  None Subjective:  Had a bowel movements after the suppository complains of some left upper chest pain which is very superficial and reproducible no shortness of breath nausea vomiting reported Objective: Vitals:   04/02/24 0851 04/02/24 1601 04/02/24 1942 04/03/24 0749  BP: (!) 119/49 (!) 163/64 (!) 129/54 (!) 106/46  Pulse: 67 79 63 67  Resp: 18  18   Temp: 98.2 F (36.8 C) 98.8 F (37.1 C) 99 F (37.2 C) 98.2 F (36.8 C)  TempSrc: Oral Oral Oral Oral  SpO2: 100% 100% 100% 100%  Weight:      Height:        Intake/Output Summary (Last 24 hours) at 04/03/2024 1310 Last data filed at 04/03/2024 0900 Gross per 24 hour  Intake 120 ml  Output 400 ml  Net -280 ml   Filed Weights   03/27/24 1506 03/27/24 1552  Weight: 60.8 kg 60.8 kg    Examination:  General exam: Appears in mild distress  respiratory system: Clear to auscultation. Respiratory effort normal. Cardiovascular system: S1 & S2 heard, RRR. No JVD, murmurs, rubs, gallops or clicks. Gastrointestinal system: Abdomen is nondistended, soft and nontender. No organomegaly or masses felt. Normal bowel sounds heard. Central nervous system: Alert and oriented. No focal neurological deficits. Extremities: Trace edema left upper extremity with decreased range of motion  Data Reviewed: I have personally reviewed following labs and imaging studies  CBC: Recent Labs  Lab 03/29/24 0641 03/30/24 1259 03/31/24 0457 04/01/24 0525 04/02/24 0953  WBC 7.6 10.4 9.2 9.6 8.9  HGB 10.3* 10.0* 7.8* 9.5* 9.2*  HCT 31.1* 30.7* 24.4* 29.0* 28.2*  MCV 92.8 94.5 95.3 94.2 94.6  PLT 170 161 159 196 213   Basic Metabolic Panel: Recent Labs  Lab 03/29/24 0641 03/30/24 1259 03/31/24 0457 04/01/24 0525 04/02/24 0953  NA 134* 139 137 136 133*  K 4.1 3.9 4.2 4.2 4.2  CL 105 107 108 105 102  CO2 22 19* 23 23 22    GLUCOSE 131* 183* 214* 122* 129*  BUN 10 10 13 14 9   CREATININE 0.84 0.90 0.95 0.80 0.79  CALCIUM  8.6* 8.7* 8.3* 8.5* 8.3*  MG 1.9  --   --   --   --    GFR: Estimated Creatinine Clearance: 45.2 mL/min (by C-G formula based on SCr of 0.79 mg/dL). Liver Function Tests: No results for input(s): "AST", "ALT", "ALKPHOS", "BILITOT", "PROT", "ALBUMIN " in the last 168 hours. No results for input(s): "LIPASE", "AMYLASE" in the last 168 hours. No results for input(s): "AMMONIA" in the last 168 hours. Coagulation Profile: Recent Labs  Lab 03/27/24 2259  INR 1.2   Cardiac Enzymes: No results for input(s): "CKTOTAL", "CKMB", "CKMBINDEX", "TROPONINI" in the last 168 hours. BNP (last 3 results) No results for input(s): "PROBNP" in the last 8760 hours. HbA1C: No results for input(s): "HGBA1C" in the last 72 hours.  CBG: Recent Labs  Lab 04/02/24 1130 04/02/24 1558 04/02/24 2135 04/03/24 0620 04/03/24 1218  GLUCAP 136* 156* 127* 114* 194*   Lipid Profile: No results for input(s): "CHOL", "HDL", "LDLCALC", "TRIG", "CHOLHDL", "LDLDIRECT" in the last 72 hours. Thyroid  Function Tests: No results for input(s): "  TSH", "T4TOTAL", "FREET4", "T3FREE", "THYROIDAB" in the last 72 hours. Anemia Panel: No results for input(s): "VITAMINB12", "FOLATE", "FERRITIN", "TIBC", "IRON", "RETICCTPCT" in the last 72 hours. Sepsis Labs: No results for input(s): "PROCALCITON", "LATICACIDVEN" in the last 168 hours.  Recent Results (from the past 240 hours)  Surgical pcr screen     Status: None   Collection Time: 03/28/24 10:00 PM   Specimen: Nasal Mucosa; Nasal Swab  Result Value Ref Range Status   MRSA, PCR NEGATIVE NEGATIVE Final   Staphylococcus aureus NEGATIVE NEGATIVE Final    Comment: (NOTE) The Xpert SA Assay (FDA approved for NASAL specimens in patients 95 years of age and older), is one component of a comprehensive surveillance program. It is not intended to diagnose infection nor to guide or  monitor treatment. Performed at [facility], [address] [place], [place] [address]   Urine Culture     Status: None   Collection Time: 03/30/24  8:36 AM   Specimen: Urine, Catheterized  Result Value Ref Range Status   Specimen Description URINE, CATHETERIZED  Final   Special Requests NONE  Final   Culture   Final    NO GROWTH Performed at [facility], [address] [place], [place] [address]    Report Status 03/31/2024 FINAL  Final     Radiology Studies: No results found.   Scheduled Meds:  aspirin  EC  81 mg Oral Daily   bisacodyl   10 mg Oral Q0200   bisacodyl   10 mg Rectal Once   cholecalciferol   2,000 Units Oral Daily   clopidogrel   75 mg Oral Q breakfast   docusate sodium   200 mg Oral BID   folic acid   1 mg Oral QPM   insulin  aspart  0-5 Units Subcutaneous QHS   insulin  aspart  0-9 Units Subcutaneous TID WC   isosorbide  mononitrate  60 mg Oral Daily   lidocaine   1 patch Transdermal Q24H   methocarbamol   500 mg Oral TID   metoprolol  tartrate  25 mg Oral BID   oxyCODONE   10 mg Oral Q12H   pantoprazole   40 mg Oral Daily   polyethylene glycol  17 g Oral BID   senna-docusate  3 tablet Oral QHS   valACYclovir   1,000 mg Oral Q12H   Continuous Infusions:   LOS: 7 days    Time spent: 40 min  Barbee Lew, MD 04/03/2024, 1:10 PM

## 2024-04-03 NOTE — Care Management Important Message (Signed)
 Important Message  Patient Details  Name: [name] [name] MRN: [id number] Date of Birth: 07-26-1941   Important Message Given:  Yes - Medicare IM     Felix Host 04/03/2024, 2:45 PM

## 2024-04-03 NOTE — Progress Notes (Signed)
  Subjective: [name] [name] is a 83 y.o. female s/p left RSA.  They are POD 4.  Pt's pain is moderate but controlled.  Patient denies any complaints of chest pain, shortness of breath, abdominal pain.  Most of her pain is actually in her left hip.  Worse when she is turned by staff for using bedpan.  Has not put any weight on the leg.  Work with occupational therapy today.  No PT until tomorrow.  Objective: Vital signs in last 24 hours: Temp:  [98.2 F (36.8 C)-99 F (37.2 C)] 98.2 F (36.8 C) (05/19 0749) Pulse Rate:  [63-79] 67 (05/19 0749) Resp:  [18] 18 (05/18 1942) BP: (106-163)/(46-64) 106/46 (05/19 0749) SpO2:  [100 %] 100 % (05/19 0749)  Intake/Output from previous day: 05/18 0701 - 05/19 0700 In: -  Out: 400 [Urine:400] Intake/Output this shift: Total I/O In: 120 [P.O.:120] Out: -   Exam:  No gross blood or drainage overlying the dressing 2+ radial pulse of the operative extremity Postoperative physical exam somewhat limited by interscalene block but intact EPL, FPL, finger abduction, pronation/supination, bicep, tricep.  Axillary nerve feels to be intact with deltoid firing weakly.   Labs: Recent Labs    04/01/24 0525 04/02/24 0953  HGB 9.5* 9.2*   Recent Labs    04/01/24 0525 04/02/24 0953  WBC 9.6 8.9  RBC 3.08* 2.98*  HCT 29.0* 28.2*  PLT 196 213   Recent Labs    04/01/24 0525 04/02/24 0953  NA 136 133*  K 4.2 4.2  CL 105 102  CO2 23 22  BUN 14 9  CREATININE 0.80 0.79  GLUCOSE 122* 129*  CALCIUM  8.5* 8.3*   No results for input(s): "LABPT", "INR" in the last 72 hours.  Assessment/Plan: Pt is POD 4 s/p left RSA    - Disposition per hospitalist team, appreciate their management.  Likely will need either CIR or SNF due to need for nonweightbearing to the left lower extremity due to multiple pelvic fractures for about 3 weeks.  -No lifting with the operative arm.  No range of motion of the operative shoulder.  Okay for full range of motion of  left elbow, hand, wrist  -Follow-up with Dr. Rozelle Corning in clinic 2 weeks postoperatively     [facility] 04/03/2024, 1:53 PM

## 2024-04-04 ENCOUNTER — Encounter (HOSPITAL_COMMUNITY): Payer: Self-pay | Admitting: Orthopedic Surgery

## 2024-04-04 DIAGNOSIS — S42412A Displaced simple supracondylar fracture without intercondylar fracture of left humerus, initial encounter for closed fracture: Secondary | ICD-10-CM | POA: Diagnosis not present

## 2024-04-04 LAB — COMPREHENSIVE METABOLIC PANEL WITH GFR
ALT: 18 U/L (ref 0–44)
AST: 26 U/L (ref 15–41)
Albumin: 2.5 g/dL — ABNORMAL LOW (ref 3.5–5.0)
Alkaline Phosphatase: 60 U/L (ref 38–126)
Anion gap: 6 (ref 5–15)
BUN: 9 mg/dL (ref 8–23)
CO2: 23 mmol/L (ref 22–32)
Calcium: 8.4 mg/dL — ABNORMAL LOW (ref 8.9–10.3)
Chloride: 103 mmol/L (ref 98–111)
Creatinine, Ser: 0.89 mg/dL (ref 0.44–1.00)
GFR, Estimated: >60 mL/min (ref >60)
Glucose, Bld: 158 mg/dL — ABNORMAL HIGH (ref 70–99)
Potassium: 4.6 mmol/L (ref 3.5–5.1)
Sodium: 132 mmol/L — ABNORMAL LOW (ref 135–145)
Total Bilirubin: 1.1 mg/dL (ref 0.0–1.2)
Total Protein: 5.9 g/dL — ABNORMAL LOW (ref 6.5–8.1)

## 2024-04-04 LAB — CBC
HCT: 25.7 % — ABNORMAL LOW (ref 36.0–46.0)
Hemoglobin: 8.4 g/dL — ABNORMAL LOW (ref 12.0–15.0)
MCH: 30.8 pg (ref 26.0–34.0)
MCHC: 32.7 g/dL (ref 30.0–36.0)
MCV: 94.1 fL (ref 80.0–100.0)
Platelets: 278 10*3/uL (ref 150–400)
RBC: 2.73 MIL/uL — ABNORMAL LOW (ref 3.87–5.11)
RDW: 14.7 % (ref 11.5–15.5)
WBC: 8 10*3/uL (ref 4.0–10.5)
nRBC: 0 % (ref 0.0–0.2)

## 2024-04-04 LAB — GLUCOSE, CAPILLARY
Glucose-Capillary: 153 mg/dL — ABNORMAL HIGH (ref 70–99)
Glucose-Capillary: 143 mg/dL — ABNORMAL HIGH (ref 70–99)
Glucose-Capillary: 186 mg/dL — ABNORMAL HIGH (ref 70–99)
Glucose-Capillary: 139 mg/dL — ABNORMAL HIGH (ref 70–99)
Glucose-Capillary: 139 mg/dL — ABNORMAL HIGH (ref 70–99)

## 2024-04-04 MED ORDER — ISOSORBIDE MONONITRATE ER 30 MG PO TB24
30.0000 mg | ORAL_TABLET | Freq: Every day | ORAL | Status: DC
  Administered 2024-04-05 – 2024-04-06 (×2): 30 mg via ORAL
  Filled 2024-04-04 (×2): qty 1

## 2024-04-04 MED ORDER — METOPROLOL TARTRATE 12.5 MG HALF TABLET
12.5000 mg | ORAL_TABLET | Freq: Two times a day (BID) | ORAL | Status: DC
  Administered 2024-04-04 – 2024-04-06 (×4): 12.5 mg via ORAL
  Filled 2024-04-04 (×4): qty 1

## 2024-04-04 NOTE — Progress Notes (Signed)
 PROGRESS NOTE    [name] [name]  WUJ:[id number] DOB: June 12, 1941 DOA: 03/27/2024 PCP: Pete Brand, DO   Brief Narrative:   83 y.o. female with medical history significant of RA on methotrexate , type II DM, CAD status post CABG, brought to the emergency department for evaluation after mechanical fall.  She reports left arm and left hip pain and subsequent decreased range of motion of the left upper and lower extremities.  No paresthesias or change in sensation. She was diagnosed with zoster and started acyclovir prior to the fall.  She reports the symptoms are improving. She is not on any anticoagulants.  ED workup significant for left humeral fracture.  She was hospitalized for further management.  Assessment & Plan:   Principal Problem:   Left supracondylar humerus fracture, closed, initial encounter Active Problems:   Carotid artery disease (HCC)   CAD (coronary artery disease)   Rheumatoid arthritis (HCC)   Insulin  dependent type 2 diabetes mellitus (HCC)   Closed fracture of proximal end of left humerus with routine healing  Left humerus fracture-status post left shoulder reverse arthroplasty Dr. Rozelle Corning Mar 30, 2024.  Follow-up with Dr. Rozelle Corning in 2 weeks.  No lifting with the operative arm.  No range of motion of the operative shoulder.  Full range of motion for left elbow hand and wrist okay.  Ortho had consulted vascular due to asymmetric radial pulses on the left compared to the right.  Patient with history of prior CABG and radial A-line. DVT prophylaxis with aspirin  and Plavix  and SCDs per Ortho    A CT angiogram of the left upper extremity was done-  No evidence of acute vascular injury at the level of the known left humeral head and neck fracture.Nonvisualization of the radial artery from the proximal forearm through the wrist, of uncertain acuity. The origin of the radial artery opacifies normally, and there is reconstitution of the radial artery at the level of the  wrist.Diminutive but patent radial artery is visualized throughout its course.  Comminuted left humeral head and neck fracture not appreciably changed since prior study. Severe inferior subluxation of the humeral head within the glenoid fossa. Persistent soft tissue edema surrounding the left shoulder and extending into the proximal upper arm. No fluid collection or hematoma.  Pelvic fracture present on admission-she is nonweightbearing to the left lower extremity for 3 weeks.  She will need SNF. MRI pelvis Acute to subacute nondisplaced fractures of the left pubic body and left inferior pubic ramus. Acute to subacute nondisplaced vertically oriented fracture of the medial aspect of the left iliac bone near the left sacroiliac joint. This does not appear to extend into the sacroiliac joint. Moderate edema throughout the left adductor muscles originating off of the left pubic body and inferior left pubic ramus. Mild edema within the contralateral right groin musculature. Moderate to high-grade anterior superior left femoral head and acetabular cartilage thinning.   Herpes zoster right chest/flank area Recently diagnosed.  Started on Valtrex .  Contact precautions for now.   Hypertension on Imdur  and Lopressor  blood pressure is running soft will decrease metoprolol  to 12.5 twice daily and Imdur  to 30 mg daily.  History of rheumatoid arthritis On methotrexate  at home which is currently on hold.   Coronary artery disease She is status post PCI and CABG.  Last PCI was in 2023.  Drug-eluting stent was placed in mid LAD at that time. Continue Aspirin  and Plavix     Continue with metoprolol . ARB is on hold.(Soft bp)  Normocytic anemia stable hb 7.8 from 10.3 No signs of active bleed Follow in am   Diabetes mellitus type 2 On SSI. Prior to admission patient was on 75/25 insulin . HbA1c  7.2 CBG (last 3)  Recent Labs    04/04/24 0647 04/04/24 0813 04/04/24 1133  GLUCAP 153* 143* 186*    Constipation-resolved continue stool softners laxatives Dulcolax supp  Estimated body mass index is 24.92 kg/m as calculated from the following:   Height as of this encounter: 5' 1.5" (1.562 m).   Weight as of this encounter: 60.8 kg.  DVT prophylaxis scd Code Status: full Family Communication:daughter at bed side Disposition Plan:  Status is: Inpatient Remains inpatient appropriate because: acute humerus fracture and pelvic fx   Consultants:  Ortho Vascular  Procedures: left shoulder reverse arthroplasty Antimicrobials:  None Subjective: Patient resting in bed continues to have some pain concerned more about the pelvic pain than the left shoulder pain especially when moving around or sitting in the toilet   Objective: Vitals:   04/03/24 1447 04/03/24 2038 04/04/24 0539 04/04/24 0810  BP: (!) 125/52 (!) 131/47 (!) 129/59 (!) 111/42  Pulse: (!) 58 65 69 (!) 56  Resp: 18 16 16 16   Temp: 98.6 F (37 C) 98.5 F (36.9 C) 99.1 F (37.3 C) 98.9 F (37.2 C)  TempSrc:      SpO2: 99% 100% 100% 98%  Weight:      Height:        Intake/Output Summary (Last 24 hours) at 04/04/2024 1441 Last data filed at 04/04/2024 0600 Gross per 24 hour  Intake --  Output 850 ml  Net -850 ml   Filed Weights   03/27/24 1506 03/27/24 1552  Weight: 60.8 kg 60.8 kg    Examination:  General exam: Appears in mild distress  respiratory system: Clear to auscultation. Respiratory effort normal. Cardiovascular system: S1 & S2 heard, RRR. No JVD, murmurs, rubs, gallops or clicks. Gastrointestinal system: Abdomen is nondistended, soft and nontender. No organomegaly or masses felt. Normal bowel sounds heard. Central nervous system: Alert and oriented. No focal neurological deficits. Extremities: Trace edema left upper extremity with decreased range of motion  Data Reviewed: I have personally reviewed following labs and imaging studies  CBC: Recent Labs  Lab 03/29/24 0641 03/30/24 1259  03/31/24 0457 04/01/24 0525 04/02/24 0953  WBC 7.6 10.4 9.2 9.6 8.9  HGB 10.3* 10.0* 7.8* 9.5* 9.2*  HCT 31.1* 30.7* 24.4* 29.0* 28.2*  MCV 92.8 94.5 95.3 94.2 94.6  PLT 170 161 159 196 213   Basic Metabolic Panel: Recent Labs  Lab 03/29/24 0641 03/30/24 1259 03/31/24 0457 04/01/24 0525 04/02/24 0953  NA 134* 139 137 136 133*  K 4.1 3.9 4.2 4.2 4.2  CL 105 107 108 105 102  CO2 22 19* 23 23 22   GLUCOSE 131* 183* 214* 122* 129*  BUN 10 10 13 14 9   CREATININE 0.84 0.90 0.95 0.80 0.79  CALCIUM  8.6* 8.7* 8.3* 8.5* 8.3*  MG 1.9  --   --   --   --    GFR: Estimated Creatinine Clearance: 45.2 mL/min (by C-G formula based on SCr of 0.79 mg/dL). Liver Function Tests: No results for input(s): "AST", "ALT", "ALKPHOS", "BILITOT", "PROT", "ALBUMIN " in the last 168 hours. No results for input(s): "LIPASE", "AMYLASE" in the last 168 hours. No results for input(s): "AMMONIA" in the last 168 hours. Coagulation Profile: No results for input(s): "INR", "PROTIME" in the last 168 hours.  Cardiac Enzymes: No results for input(s): "  CKTOTAL", "CKMB", "CKMBINDEX", "TROPONINI" in the last 168 hours. BNP (last 3 results) No results for input(s): "PROBNP" in the last 8760 hours. HbA1C: No results for input(s): "HGBA1C" in the last 72 hours.  CBG: Recent Labs  Lab 04/03/24 1610 04/03/24 2123 04/04/24 0647 04/04/24 0813 04/04/24 1133  GLUCAP 177* 225* 153* 143* 186*   Lipid Profile: No results for input(s): "CHOL", "HDL", "LDLCALC", "TRIG", "CHOLHDL", "LDLDIRECT" in the last 72 hours. Thyroid  Function Tests: No results for input(s): "TSH", "T4TOTAL", "FREET4", "T3FREE", "THYROIDAB" in the last 72 hours. Anemia Panel: No results for input(s): "VITAMINB12", "FOLATE", "FERRITIN", "TIBC", "IRON", "RETICCTPCT" in the last 72 hours. Sepsis Labs: No results for input(s): "PROCALCITON", "LATICACIDVEN" in the last 168 hours.  Recent Results (from the past 240 hours)  Surgical pcr screen      Status: None   Collection Time: 03/28/24 10:00 PM   Specimen: Nasal Mucosa; Nasal Swab  Result Value Ref Range Status   MRSA, PCR NEGATIVE NEGATIVE Final   Staphylococcus aureus NEGATIVE NEGATIVE Final    Comment: (NOTE) The Xpert SA Assay (FDA approved for NASAL specimens in patients 58 years of age and older), is one component of a comprehensive surveillance program. It is not intended to diagnose infection nor to guide or monitor treatment. Performed at [facility], [address] [place], [place] [address]   Urine Culture     Status: None   Collection Time: 03/30/24  8:36 AM   Specimen: Urine, Catheterized  Result Value Ref Range Status   Specimen Description URINE, CATHETERIZED  Final   Special Requests NONE  Final   Culture   Final    NO GROWTH Performed at [facility], [address], [place], [place] [address]    Report Status 03/31/2024 FINAL  Final     Radiology Studies: No results found.   Scheduled Meds:  aspirin  EC  81 mg Oral Daily   bisacodyl   10 mg Oral Q0200   bisacodyl   10 mg Rectal Once   cholecalciferol   2,000 Units Oral Daily   clopidogrel   75 mg Oral Q breakfast   docusate sodium   200 mg Oral BID   folic acid   1 mg Oral QPM   insulin  aspart  0-5 Units Subcutaneous QHS   insulin  aspart  0-9 Units Subcutaneous TID WC   isosorbide  mononitrate  60 mg Oral Daily   lidocaine   1 patch Transdermal Q24H   methocarbamol   500 mg Oral TID   metoprolol  tartrate  25 mg Oral BID   oxyCODONE   10 mg Oral Q12H   pantoprazole   40 mg Oral Daily   polyethylene glycol  17 g Oral BID   senna-docusate  3 tablet Oral QHS   valACYclovir   1,000 mg Oral Q12H   Continuous Infusions:   LOS: 8 days    Time spent: 40 min  Barbee Lew, MD 04/04/2024, 2:41 PM

## 2024-04-04 NOTE — Progress Notes (Signed)
 Inpatient Rehab Admissions Coordinator:   Consult received and chart reviewed.  We would not be able to get approval for CIR with [facility] Medicare for this patient to admit.  Pt with limited tolerance to therapy sessions related to pain.  She has not been able to mobilize out of bed since admission due to pain.  [employer] also unlikely to approve orthopedic diagnosis for AIR, unfortunately.  Therapy recommendations are appropriately SNF.  We will sign off.    Loye Rumble, PT, DPT Admissions Coordinator [phone] 04/04/24  9:57 AM

## 2024-04-04 NOTE — Progress Notes (Signed)
 Message from PA: consulting CIR to see if pt is candidate. Baldo Bonds, LCSW
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
## 2024-04-04 NOTE — Plan of Care (Signed)

## 2024-04-04 NOTE — Plan of Care (Signed)

## 2024-04-04 NOTE — Progress Notes (Signed)
 Physical Therapy Treatment Patient Details Name: [name] [name] MRN: [id number] DOB: 1941/05/10 Today's Date: 04/04/2024   History of Present Illness 83 y.o. female presents to [facility] hospital on 03/27/2024 after a fall onto left side. Imaging is notable for a L humeral fx. 5/14 MRI revealed L pubic rami fxs and L iliac fx. S/p L reverse shoulder replacement 5/15. PMH includes RA, DM II, CABG.    PT Comments  Pt received in supine and agreeable to session. Pt appears anxious about pain with mobility throughout session, however pt reports pain was tolerable at the end of the session. Pt able to tolerate sitting to EOB, performing BLE exercise, and attempting lateral scoots. Pt requires max cues for sequencing and technique throughout. Pt requires increased assist with LLE management due to pain. Pt demonstrates difficulty pushing through RLE/RUE for lateral scoots, requiring max A +2. Pt continues to benefit from PT services to progress toward functional mobility goals.    If plan is discharge home, recommend the following: Two people to help with walking and/or transfers;Two people to help with bathing/dressing/bathroom;Assistance with cooking/housework;Assist for transportation;Help with stairs or ramp for entrance   Can travel by private vehicle     No  Equipment Recommendations  Wheelchair (measurements PT);Wheelchair cushion (measurements PT);Hoyer lift;Hospital bed    Recommendations for Other Services       Precautions / Restrictions Precautions Precautions: Fall;Shoulder Type of Shoulder Precautions: NO AROM/PROM of shoulder; elbow, wrist, hand ROM okay Shoulder Interventions: Shoulder sling/immobilizer Recall of Precautions/Restrictions: Intact Required Braces or Orthoses: Sling Restrictions Weight Bearing Restrictions Per Provider Order: Yes LUE Weight Bearing Per Provider Order: Non weight bearing RLE Weight Bearing Per Provider Order: Weight bearing as tolerated LLE Weight  Bearing Per Provider Order: Non weight bearing     Mobility  Bed Mobility Overal bed mobility: Needs Assistance Bed Mobility: Supine to Sit, Sit to Supine     Supine to sit: HOB elevated, Used rails, Mod assist, +2 for physical assistance Sit to supine: Mod assist, +2 for physical assistance   General bed mobility comments: Pt able to advance RLE towards EOB, but needs assist for LLE management. Dense cues for sequencing and assist for trunk elevation and pivoting towards EOB using bedpad. Assist for BLE elevation back to EOB and repositioning    Transfers Overall transfer level: Needs assistance                 General transfer comment: Attempted lateral scoots towards [name] with pt requiring max A +2 with bedpad. Cues for technique and use of RUE/RLE    Ambulation/Gait                   Stairs             Wheelchair Mobility     Tilt Bed    Modified Rankin (Stroke Patients Only)       Balance Overall balance assessment: Needs assistance Sitting-balance support: Single extremity supported, Feet unsupported Sitting balance-Leahy Scale: Poor Sitting balance - Comments: sitting EOB with min a to achieve upright posture progressing to CGA Postural control: Right lateral lean                                  Communication Communication Communication: No apparent difficulties  Cognition Arousal: Alert Behavior During Therapy: Anxious   PT - Cognitive impairments: No family/caregiver present to determine baseline  Following commands: Impaired Following commands impaired: Follows one step commands with increased time    Cueing Cueing Techniques: Verbal cues, Tactile cues  Exercises General Exercises - Lower Extremity Long Arc Quad: AROM, Seated, Both, 10 reps    General Comments        Pertinent Vitals/Pain Pain Assessment Pain Assessment: Faces Faces Pain Scale: Hurts even more Pain  Location: L pelvis with mobility Pain Descriptors / Indicators: Guarding, Grimacing, Sore Pain Intervention(s): Limited activity within patient's tolerance, Monitored during session, Repositioned, Ice applied     PT Goals (current goals can now be found in the care plan section) Acute Rehab PT Goals Patient Stated Goal: to reduce pain PT Goal Formulation: With patient Time For Goal Achievement: 04/15/24 Progress towards PT goals: Progressing toward goals    Frequency    Min 2X/week       AM-PAC PT "6 Clicks" Mobility   Outcome Measure  Help needed turning from your back to your side while in a flat bed without using bedrails?: A Lot Help needed moving from lying on your back to sitting on the side of a flat bed without using bedrails?: Total Help needed moving to and from a bed to a chair (including a wheelchair)?: Total Help needed standing up from a chair using your arms (e.g., wheelchair or bedside chair)?: Total Help needed to walk in hospital room?: Total Help needed climbing 3-5 steps with a railing? : Total 6 Click Score: 7    End of Session   Activity Tolerance: Patient limited by pain Patient left: in bed;with call bell/phone within reach;with bed alarm set Nurse Communication: Mobility status PT Visit Diagnosis: Difficulty in walking, not elsewhere classified (R26.2);Pain;Muscle weakness (generalized) (M62.81)     Time: 1345-1410 PT Time Calculation (min) (ACUTE ONLY): 25 min  Charges:    $Therapeutic Activity: 23-37 mins PT General Charges $$ ACUTE PT VISIT: 1 Visit                     Michaelle Adolphus, PTA Acute Rehabilitation Services Secure Chat Preferred  [phone]    Michaelle Adolphus 04/04/2024, 2:32 PM
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
## 2024-04-05 DIAGNOSIS — S42412A Displaced simple supracondylar fracture without intercondylar fracture of left humerus, initial encounter for closed fracture: Secondary | ICD-10-CM | POA: Diagnosis not present

## 2024-04-05 DIAGNOSIS — R079 Chest pain, unspecified: Secondary | ICD-10-CM | POA: Diagnosis not present

## 2024-04-05 DIAGNOSIS — I25110 Atherosclerotic heart disease of native coronary artery with unstable angina pectoris: Secondary | ICD-10-CM | POA: Diagnosis not present

## 2024-04-05 LAB — CBC
HCT: 24.2 % — ABNORMAL LOW (ref 36.0–46.0)
Hemoglobin: 8 g/dL — ABNORMAL LOW (ref 12.0–15.0)
MCH: 30.8 pg (ref 26.0–34.0)
MCHC: 33.1 g/dL (ref 30.0–36.0)
MCV: 93.1 fL (ref 80.0–100.0)
Platelets: 293 10*3/uL (ref 150–400)
RBC: 2.6 MIL/uL — ABNORMAL LOW (ref 3.87–5.11)
RDW: 14.7 % (ref 11.5–15.5)
WBC: 7.4 10*3/uL (ref 4.0–10.5)
nRBC: 0 % (ref 0.0–0.2)

## 2024-04-05 LAB — TROPONIN I (HIGH SENSITIVITY)
Troponin I (High Sensitivity): 10 ng/L (ref <18)
Troponin I (High Sensitivity): 10 ng/L (ref <18)

## 2024-04-05 LAB — COMPREHENSIVE METABOLIC PANEL WITH GFR
ALT: 14 U/L (ref 0–44)
AST: 22 U/L (ref 15–41)
Albumin: 2.4 g/dL — ABNORMAL LOW (ref 3.5–5.0)
Alkaline Phosphatase: 62 U/L (ref 38–126)
Anion gap: 7 (ref 5–15)
BUN: 9 mg/dL (ref 8–23)
CO2: 23 mmol/L (ref 22–32)
Calcium: 8.3 mg/dL — ABNORMAL LOW (ref 8.9–10.3)
Chloride: 103 mmol/L (ref 98–111)
Creatinine, Ser: 0.87 mg/dL (ref 0.44–1.00)
GFR, Estimated: >60 mL/min (ref >60)
Glucose, Bld: 131 mg/dL — ABNORMAL HIGH (ref 70–99)
Potassium: 4.5 mmol/L (ref 3.5–5.1)
Sodium: 133 mmol/L — ABNORMAL LOW (ref 135–145)
Total Bilirubin: 1.2 mg/dL (ref 0.0–1.2)
Total Protein: 5.8 g/dL — ABNORMAL LOW (ref 6.5–8.1)

## 2024-04-05 LAB — GLUCOSE, CAPILLARY
Glucose-Capillary: 129 mg/dL — ABNORMAL HIGH (ref 70–99)
Glucose-Capillary: 179 mg/dL — ABNORMAL HIGH (ref 70–99)
Glucose-Capillary: 174 mg/dL — ABNORMAL HIGH (ref 70–99)
Glucose-Capillary: 141 mg/dL — ABNORMAL HIGH (ref 70–99)

## 2024-04-05 NOTE — Progress Notes (Signed)
 PROGRESS NOTE    [name] [name]  ZOX:[id number] DOB: [birth year] DOA: 03/27/2024 PCP: Pete Brand, DO   Brief Narrative:  83 y.o. female with medical history significant of RA on methotrexate , type II DM, CAD status post CABG, brought to the emergency department for evaluation after mechanical fall.  Found to have left humeral fracture.  Recently diagnosed with herpes zoster.  Also found to have left-sided pelvic fractures which was nonoperatively managed.    Assessment & Plan:   Left humerus fracture Status post left shoulder reverse arthroplasty Dr. Rozelle Corning Mar 30, 2024.   Follow-up with Dr. Rozelle Corning in 2 weeks.   No lifting with the operative arm.  No range of motion of the operative shoulder.  Full range of motion for left elbow hand and wrist okay. Ortho had consulted vascular due to asymmetric radial pulses on the left compared to the right.  Patient with history of prior CABG and radial A-line.  Patient underwent CT angiogram of the upper extremity.  Seen by vascular surgery.  No further workup or interventions at this time.  Left-sided chest pain shoulder pain Noted to have left-sided chest pain this morning radiating to the right.  Patient with history of CAD.  EKG today shows T wave inversions.  Seen in previous EKG as well.  Unclear if this is cardiac etiology or referred pain from her shoulder.  Will check troponin levels.  Pelvic fracture She is nonweightbearing to the left lower extremity for 3 weeks.  She will need SNF. MRI pelvis Acute to subacute nondisplaced fractures of the left pubic body and left inferior pubic ramus.   Herpes zoster right chest/flank area Recently diagnosed.  Started on Valtrex .  Symptoms have improved.  Lesions have improved.  Essential hypertension Continue with metoprolol , Imdur .   History of rheumatoid arthritis On methotrexate  at home which is currently on hold.   Coronary artery disease She is status post PCI and CABG.  Last PCI was in 2023.   Drug-eluting stent was placed in mid LAD at that time. Continue Aspirin  and Plavix   Continue with metoprolol . ARB is on hold  for Soft bp. Blood pressure has improved.  Should be able to resume ARB at discharge.   Normocytic anemia No overt bleeding.  Hemoglobin is stable.  Continue to monitor.   Diabetes mellitus type 2 On SSI. Prior to admission patient was on 75/25 insulin . HbA1c  7.2  Constipation Resolved. Continue stool softners laxatives Dulcolax supp  DVT prophylaxis: Noted to be on SCDs Code Status: full Family Communication:daughter at bed side Disposition Plan: SNF for short-term rehab   Consultants:  Ortho Vascular  Procedures: left shoulder reverse arthroplasty Antimicrobials: None  Subjective: Patient sitting on the bed eating breakfast.  Daughter is at the bedside.  When I saw her she did not have any complaints.  Subsequently mentioned chest pain to nursing staff.    Objective: Vitals:   04/04/24 1617 04/04/24 2006 04/05/24 0530 04/05/24 0850  BP: (!) 125/39 (!) 120/59 (!) 119/44 (!) 130/40  Pulse: (!) 55 62 64 71  Resp: 16 19 17    Temp: 98.7 F (37.1 C) 99.2 F (37.3 C) 99.1 F (37.3 C) 98.3 F (36.8 C)  TempSrc: Oral Oral Oral Oral  SpO2: 91% 98% 100% 100%  Weight:      Height:        Intake/Output Summary (Last 24 hours) at 04/05/2024 1010 Last data filed at 04/05/2024 0901 Gross per 24 hour  Intake 360 ml  Output 300  ml  Net 60 ml   Filed Weights   03/27/24 1506 03/27/24 1552  Weight: 60.8 kg 60.8 kg    Examination:  General appearance: Awake alert.  In no distress Resp: Clear to auscultation bilaterally.  Normal effort Cardio: S1-S2 is normal regular.  No S3-S4.  No rubs murmurs or bruit GI: Abdomen is soft.  Nontender nondistended.  Bowel sounds are present normal.  No masses organomegaly Extremities: Left arm is in dressing  Data Reviewed: I have personally reviewed following labs and imaging studies  CBC: Recent Labs  Lab  03/31/24 0457 04/01/24 0525 04/02/24 0953 04/04/24 1512 04/05/24 0703  WBC 9.2 9.6 8.9 8.0 7.4  HGB 7.8* 9.5* 9.2* 8.4* 8.0*  HCT 24.4* 29.0* 28.2* 25.7* 24.2*  MCV 95.3 94.2 94.6 94.1 93.1  PLT 159 196 213 278 293   Basic Metabolic Panel: Recent Labs  Lab 03/31/24 0457 04/01/24 0525 04/02/24 0953 04/04/24 1512 04/05/24 0703  NA 137 136 133* 132* 133*  K 4.2 4.2 4.2 4.6 4.5  CL 108 105 102 103 103  CO2 23 23 22 23 23   GLUCOSE 214* 122* 129* 158* 131*  BUN 13 14 9 9 9   CREATININE 0.95 0.80 0.79 0.89 0.87  CALCIUM  8.3* 8.5* 8.3* 8.4* 8.3*   GFR: Estimated Creatinine Clearance: 41.5 mL/min (by C-G formula based on SCr of 0.87 mg/dL).  Liver Function Tests: Recent Labs  Lab 04/04/24 1512 04/05/24 0703  AST 26 22  ALT 18 14  ALKPHOS 60 62  BILITOT 1.1 1.2  PROT 5.9* 5.8*  ALBUMIN  2.5* 2.4*    CBG: Recent Labs  Lab 04/04/24 0813 04/04/24 1133 04/04/24 1622 04/04/24 2231 04/05/24 0636  GLUCAP 143* 186* 139* 139* 129*     Recent Results (from the past 240 hours)  Surgical pcr screen     Status: None   Collection Time: 03/28/24 10:00 PM   Specimen: Nasal Mucosa; Nasal Swab  Result Value Ref Range Status   MRSA, PCR NEGATIVE NEGATIVE Final   Staphylococcus aureus NEGATIVE NEGATIVE Final    Comment: (NOTE) The Xpert SA Assay (FDA approved for NASAL specimens in patients 12 years of age and older), is one component of a comprehensive surveillance program. It is not intended to diagnose infection nor to guide or monitor treatment. Performed at [facility], [address] [place], [place] [address]   Urine Culture     Status: None   Collection Time: 03/30/24  8:36 AM   Specimen: Urine, Catheterized  Result Value Ref Range Status   Specimen Description URINE, CATHETERIZED  Final   Special Requests NONE  Final   Culture   Final    NO GROWTH Performed at [facility], [address] [place], [place] [address]    Report Status 03/31/2024  FINAL  Final     Radiology Studies: No results found.   Scheduled Meds:  aspirin  EC  81 mg Oral Daily   bisacodyl   10 mg Oral Q0200   bisacodyl   10 mg Rectal Once   cholecalciferol   2,000 Units Oral Daily   clopidogrel   75 mg Oral Q breakfast   docusate sodium   200 mg Oral BID   folic acid   1 mg Oral QPM   insulin  aspart  0-5 Units Subcutaneous QHS   insulin  aspart  0-9 Units Subcutaneous TID WC   isosorbide  mononitrate  30 mg Oral Daily   lidocaine   1 patch Transdermal Q24H   methocarbamol   500 mg Oral  TID   metoprolol  tartrate  12.5 mg Oral BID   oxyCODONE   10 mg Oral Q12H   pantoprazole   40 mg Oral Daily   polyethylene glycol  17 g Oral BID   senna-docusate  3 tablet Oral QHS   valACYclovir   1,000 mg Oral Q12H   Continuous Infusions:   LOS: 9 days   Maylene Spear, MD 04/05/2024, 10:10 AM

## 2024-04-05 NOTE — TOC Progression Note (Addendum)
 Transition of Care [facility]) - Progression Note    Patient Details  Name: [name] [name] MRN: [id number] Date of Birth: 1941-04-15  [facility] CM/SW Contact  Elspeth Hals, LCSW Phone Number: 04/05/2024, 10:45 AM  Clinical Narrative:   Bed offers provided to pt and daughter [name] [name] [facility] on face sheet).  They will review.   1300: TC Tonya: she is visiting several SNFs, also would like responses from SNFs that have not yet responded.  CSW reached out to the remaining facilities.  1500: one additional bed offer at [facility].  Tonya informed. She has ruled out [place], [place].   Expected Discharge Plan: Skilled Nursing Facility Barriers to Discharge: Continued Medical Work up, SNF Pending bed offer  Expected Discharge Plan and Services In-house Referral: Clinical Social Work   Post Acute Care Choice: Skilled Nursing Facility Living arrangements for the past 2 months: Single Family Home                                       Social Determinants of Health (SDOH) Interventions SDOH Screenings   Food Insecurity: No Food Insecurity (03/27/2024)  Housing: Low Risk  (03/27/2024)  Transportation Needs: No Transportation Needs (03/27/2024)  Utilities: Not At Risk (03/27/2024)  Social Connections: Moderately Integrated (03/27/2024)  Tobacco Use: Medium Risk (03/30/2024)    Readmission Risk Interventions    12/16/2021   10:59 AM  Readmission Risk Prevention Plan  Transportation Screening Complete  PCP or Specialist Appt within 5-7 Days Complete  Home Care Screening Complete  Medication Review (RN CM) Complete

## 2024-04-06 DIAGNOSIS — I10 Essential (primary) hypertension: Secondary | ICD-10-CM | POA: Diagnosis not present

## 2024-04-06 DIAGNOSIS — S42412D Displaced simple supracondylar fracture without intercondylar fracture of left humerus, subsequent encounter for fracture with routine healing: Secondary | ICD-10-CM | POA: Diagnosis not present

## 2024-04-06 DIAGNOSIS — E785 Hyperlipidemia, unspecified: Secondary | ICD-10-CM | POA: Diagnosis not present

## 2024-04-06 DIAGNOSIS — Z7401 Bed confinement status: Secondary | ICD-10-CM | POA: Diagnosis not present

## 2024-04-06 DIAGNOSIS — B028 Zoster with other complications: Secondary | ICD-10-CM | POA: Diagnosis not present

## 2024-04-06 DIAGNOSIS — S3289XD Fracture of other parts of pelvis, subsequent encounter for fracture with routine healing: Secondary | ICD-10-CM | POA: Diagnosis not present

## 2024-04-06 DIAGNOSIS — S42302D Unspecified fracture of shaft of humerus, left arm, subsequent encounter for fracture with routine healing: Secondary | ICD-10-CM | POA: Diagnosis not present

## 2024-04-06 DIAGNOSIS — Z7185 Encounter for immunization safety counseling: Secondary | ICD-10-CM | POA: Diagnosis not present

## 2024-04-06 DIAGNOSIS — Z7901 Long term (current) use of anticoagulants: Secondary | ICD-10-CM | POA: Diagnosis not present

## 2024-04-06 DIAGNOSIS — S42202D Unspecified fracture of upper end of left humerus, subsequent encounter for fracture with routine healing: Secondary | ICD-10-CM | POA: Diagnosis not present

## 2024-04-06 DIAGNOSIS — D84821 Immunodeficiency due to drugs: Secondary | ICD-10-CM | POA: Diagnosis not present

## 2024-04-06 DIAGNOSIS — I2510 Atherosclerotic heart disease of native coronary artery without angina pectoris: Secondary | ICD-10-CM | POA: Diagnosis not present

## 2024-04-06 DIAGNOSIS — E119 Type 2 diabetes mellitus without complications: Secondary | ICD-10-CM | POA: Diagnosis not present

## 2024-04-06 DIAGNOSIS — I959 Hypotension, unspecified: Secondary | ICD-10-CM | POA: Diagnosis not present

## 2024-04-06 DIAGNOSIS — Z5189 Encounter for other specified aftercare: Secondary | ICD-10-CM | POA: Diagnosis not present

## 2024-04-06 DIAGNOSIS — S42412A Displaced simple supracondylar fracture without intercondylar fracture of left humerus, initial encounter for closed fracture: Secondary | ICD-10-CM | POA: Diagnosis not present

## 2024-04-06 DIAGNOSIS — Z96612 Presence of left artificial shoulder joint: Secondary | ICD-10-CM | POA: Diagnosis not present

## 2024-04-06 DIAGNOSIS — Z23 Encounter for immunization: Secondary | ICD-10-CM | POA: Diagnosis not present

## 2024-04-06 DIAGNOSIS — R2689 Other abnormalities of gait and mobility: Secondary | ICD-10-CM | POA: Diagnosis not present

## 2024-04-06 DIAGNOSIS — B029 Zoster without complications: Secondary | ICD-10-CM | POA: Diagnosis not present

## 2024-04-06 DIAGNOSIS — E1165 Type 2 diabetes mellitus with hyperglycemia: Secondary | ICD-10-CM | POA: Diagnosis not present

## 2024-04-06 DIAGNOSIS — M6281 Muscle weakness (generalized): Secondary | ICD-10-CM | POA: Diagnosis not present

## 2024-04-06 DIAGNOSIS — D649 Anemia, unspecified: Secondary | ICD-10-CM | POA: Diagnosis not present

## 2024-04-06 DIAGNOSIS — Z7982 Long term (current) use of aspirin: Secondary | ICD-10-CM | POA: Diagnosis not present

## 2024-04-06 DIAGNOSIS — S32592D Other specified fracture of left pubis, subsequent encounter for fracture with routine healing: Secondary | ICD-10-CM | POA: Diagnosis not present

## 2024-04-06 DIAGNOSIS — M1990 Unspecified osteoarthritis, unspecified site: Secondary | ICD-10-CM | POA: Diagnosis not present

## 2024-04-06 DIAGNOSIS — S329XXA Fracture of unspecified parts of lumbosacral spine and pelvis, initial encounter for closed fracture: Secondary | ICD-10-CM | POA: Diagnosis not present

## 2024-04-06 DIAGNOSIS — M069 Rheumatoid arthritis, unspecified: Secondary | ICD-10-CM | POA: Diagnosis not present

## 2024-04-06 DIAGNOSIS — D849 Immunodeficiency, unspecified: Secondary | ICD-10-CM | POA: Diagnosis not present

## 2024-04-06 DIAGNOSIS — R102 Pelvic and perineal pain: Secondary | ICD-10-CM | POA: Diagnosis not present

## 2024-04-06 LAB — GLUCOSE, CAPILLARY
Glucose-Capillary: 128 mg/dL — ABNORMAL HIGH (ref 70–99)
Glucose-Capillary: 178 mg/dL — ABNORMAL HIGH (ref 70–99)

## 2024-04-06 MED ORDER — SENNOSIDES-DOCUSATE SODIUM 8.6-50 MG PO TABS: 3.0000 | ORAL_TABLET | Freq: Every day | ORAL | Status: AC

## 2024-04-06 MED ORDER — METHOCARBAMOL 500 MG PO TABS: 500.0000 mg | ORAL_TABLET | Freq: Four times a day (QID) | ORAL | Status: AC | PRN

## 2024-04-06 MED ORDER — DOCUSATE SODIUM 100 MG PO CAPS: 200.0000 mg | ORAL_CAPSULE | Freq: Two times a day (BID) | ORAL | Status: AC

## 2024-04-06 MED ORDER — OXYCODONE HCL 5 MG PO TABS: 5.0000 mg | ORAL_TABLET | ORAL | 0 refills | Status: DC | PRN

## 2024-04-06 MED ORDER — ALPRAZOLAM 0.25 MG PO TABS: 0.2500 mg | ORAL_TABLET | Freq: Every day | ORAL | 0 refills | Status: AC | PRN

## 2024-04-06 MED ORDER — POLYETHYLENE GLYCOL 3350 17 G PO PACK: 17.0000 g | PACK | Freq: Two times a day (BID) | ORAL | Status: DC

## 2024-04-06 NOTE — Discharge Summary (Signed)
 [facility]  Physician Discharge Summary   Patient ID: [name] [name] MRN: [id number] DOB/AGE: 1941/06/25 83 y.o.  Admit date: 03/27/2024 Discharge date:   04/06/2024   PCP: Pete Brand, DO  DISCHARGE DIAGNOSES:    Left supracondylar humerus fracture, closed, initial encounter   Carotid artery disease (HCC)   CAD (coronary artery disease)   Rheumatoid arthritis (HCC)   Insulin  dependent type 2 diabetes mellitus (HCC)   RECOMMENDATIONS FOR OUTPATIENT FOLLOW UP: Follow-up with Dr. Rozelle Corning with orthopedics in 2 weeks Check CBC and basic metabolic panel in 1 week   Home Health: SNF Equipment/Devices: None  CODE STATUS: Full code  DISCHARGE CONDITION: fair  Diet recommendation: Regular  INITIAL HISTORY: 83 y.o. female with medical history significant of RA on methotrexate , type II DM, CAD status post CABG, brought to the emergency department for evaluation after mechanical fall.  Found to have left humeral fracture.  Recently diagnosed with herpes zoster.  Also found to have left-sided pelvic fractures which was nonoperatively managed.    HOSPITAL COURSE:   Left humerus fracture Status post left shoulder reverse arthroplasty Dr. Rozelle Corning Mar 30, 2024.   Follow-up with Dr. Rozelle Corning in 2 weeks.    -No lifting with the operative arm; no range of motion with the operative shoulder but okay for range of motion of the elbow, hand, wrist  Nonweightbearing for the left lower extremity due to multiple pelvic fractures that are nonoperative.  We will plan to keep her nonweightbearing for the first 3 weeks as we get out from this injury and then she can transition to weightbearing as tolerated. Ortho had consulted vascular due to asymmetric radial pulses on the left compared to the right.  Patient with history of prior CABG and radial A-line.  Patient underwent CT angiogram of the upper extremity.  Seen by vascular surgery.  No further workup or interventions at this time. Pain is  reasonably well-controlled   Left-sided chest pain shoulder pain Patient is complaining of left-sided shoulder pain and chest pain yesterday.  EKG showed nonspecific T wave changes which was seen on previous EKG as well.  Troponin levels were noted to be normal.  Symptoms resolved with the hydromorphone  injection.  Patient was reassured this morning.     Pelvic fracture She is nonweightbearing to the left lower extremity for 3 weeks.  She will need SNF. MRI pelvis Acute to subacute nondisplaced fractures of the left pubic body and left inferior pubic ramus.   Herpes zoster right chest/flank area Recently diagnosed.  Started on Valtrex .  Symptoms have improved.  Lesions have improved.   Essential hypertension Continue with metoprolol , Imdur .   History of rheumatoid arthritis May resume methotrexate  since herpes zoster has significantly improved.   Coronary artery disease She is status post PCI and CABG.  Last PCI was in 2023.  Drug-eluting stent was placed in mid LAD at that time. Continue Aspirin  and Plavix   Resume beta-blocker and ARB.   Normocytic anemia No overt bleeding.  Hemoglobin is stable.     Diabetes mellitus type 2 HbA1c  7.2.  Continue home medications.   Constipation Resolved. Continue stool softners laxatives  Patient stable.  Okay for discharge to SNF when bed is available.   PERTINENT LABS:  The results of significant diagnostics from this hospitalization (including imaging, microbiology, ancillary and laboratory) are listed below for reference.    Microbiology: Recent Results (from the past 240 hours)  Surgical pcr screen     Status: None   Collection  Time: 03/28/24 10:00 PM   Specimen: Nasal Mucosa; Nasal Swab  Result Value Ref Range Status   MRSA, PCR NEGATIVE NEGATIVE Final   Staphylococcus aureus NEGATIVE NEGATIVE Final    Comment: (NOTE) The Xpert SA Assay (FDA approved for NASAL specimens in patients 76 years of age and older), is one component  of a comprehensive surveillance program. It is not intended to diagnose infection nor to guide or monitor treatment. Performed at [facility], [address], [place], [place] [address]   Urine Culture     Status: None   Collection Time: 03/30/24  8:36 AM   Specimen: Urine, Catheterized  Result Value Ref Range Status   Specimen Description URINE, CATHETERIZED  Final   Special Requests NONE  Final   Culture   Final    NO GROWTH Performed at [facility], [address] [place], [place] [address]    Report Status 03/31/2024 FINAL  Final     Labs:   Basic Metabolic Panel: Recent Labs  Lab 03/31/24 0457 04/01/24 0525 04/02/24 0953 04/04/24 1512 04/05/24 0703  NA 137 136 133* 132* 133*  K 4.2 4.2 4.2 4.6 4.5  CL 108 105 102 103 103  CO2 23 23 22 23 23   GLUCOSE 214* 122* 129* 158* 131*  BUN 13 14 9 9 9   CREATININE 0.95 0.80 0.79 0.89 0.87  CALCIUM  8.3* 8.5* 8.3* 8.4* 8.3*   Liver Function Tests: Recent Labs  Lab 04/04/24 1512 04/05/24 0703  AST 26 22  ALT 18 14  ALKPHOS 60 62  BILITOT 1.1 1.2  PROT 5.9* 5.8*  ALBUMIN  2.5* 2.4*   CBC: Recent Labs  Lab 03/31/24 0457 04/01/24 0525 04/02/24 0953 04/04/24 1512 04/05/24 0703  WBC 9.2 9.6 8.9 8.0 7.4  HGB 7.8* 9.5* 9.2* 8.4* 8.0*  HCT 24.4* 29.0* 28.2* 25.7* 24.2*  MCV 95.3 94.2 94.6 94.1 93.1  PLT 159 196 213 278 293    CBG: Recent Labs  Lab 04/05/24 0636 04/05/24 1132 04/05/24 1637 04/05/24 2115 04/06/24 0633  GLUCAP 129* 179* 174* 141* 128*     IMAGING STUDIES DG Shoulder Left Port Result Date: 03/30/2024 CLINICAL DATA:  Status post shoulder surgery. EXAM: LEFT SHOULDER COMPARISON:  Preoperative imaging FINDINGS: Reverse left shoulder arthroplasty in expected alignment. No periprosthetic lucency or fracture. Recent postsurgical change includes air and edema in the joint space and soft tissues. IMPRESSION: Reverse left shoulder arthroplasty without immediate postoperative  complication. Electronically Signed   By: Chadwick Colonel M.D.   On: 03/30/2024 12:51   DG Knee Left Port Result Date: 03/29/2024 CLINICAL DATA:  Left knee effusion EXAM: PORTABLE LEFT KNEE - 1-2 VIEW COMPARISON:  06/03/2014 FINDINGS: Frontal and lateral views of the left knee demonstrates severe 3 compartmental osteoarthritis with near complete loss of joint space within the medial and lateral compartments. This has progressed since prior exam. Small suprapatellar joint effusion. No acute displaced fracture, subluxation, or dislocation. Diffuse atherosclerosis. IMPRESSION: 1. Severe progressive 3 compartmental osteoarthritis greatest in the medial and lateral compartments. 2. Small suprapatellar joint effusion. 3. No acute fracture. Electronically Signed   By: Bobbye Burrow M.D.   On: 03/29/2024 21:49   CT ANGIO UP EXTREM LEFT W &/OR WO CONTAST Result Date: 03/29/2024 CLINICAL DATA:  Comminuted left humeral head and neck fracture, diminished pulses left upper extremity EXAM: CT ANGIOGRAPHY OF THE UPPER LEFT EXTREMITY WITH CONTRAST TECHNIQUE: Multidetector CT imaging of the upper left extremity was performed according to the standard protocol  following intravenous contrast administration. RADIATION DOSE REDUCTION: This exam was performed according to the departmental dose-optimization program which includes automated exposure control, adjustment of the mA and/or kV according to patient size and/or use of iterative reconstruction technique. CONTRAST:  75mL OMNIPAQUE  IOHEXOL  350 MG/ML SOLN COMPARISON:  03/27/2024 FINDINGS: Bones/Joint/Cartilage The comminuted impacted left humeral head and neck fracture seen on prior study is again identified. Since the previous exam, there has been no appreciable change in the orientation of the fracture fragments, with impaction and medial displacement of the distal humerus relative to the humeral head and neck again noted. Severe inferior subluxation of the humeral head  relative to the glenoid again noted. No other acute bony abnormalities. Ligaments Suboptimally assessed by CT. Muscles and Tendons Continued intramuscular edema surrounding the left shoulder fracture. Soft tissues There is diffuse subcutaneous edema within the left supraclavicular region, surrounding the left shoulder, and extending into the left upper arm. No fluid collection or hematoma. Visualized portions of the left chest demonstrates left lower lobe bronchiectasis and scarring. No acute pleural or parenchymal lung disease. Visualized mediastinal structures are grossly unremarkable. Limited imaging of the intraperitoneal structures demonstrates a large cyst off the lower pole left kidney, for which no specific imaging follow-up is required. Vascular Visualized portions of the thoracic aorta are unremarkable. Great vessels are widely patent at their origin. The left subclavian, axillary, and brachial arteries are widely patent. Evaluation of the arterial structures distal to the elbow is somewhat limited due to timing of contrast bolus. The radial artery is seen at its origin, but is not well visualized within the mid to distal forearm. There is distal reconstitution at the level of the wrist. The ulnar artery is patent throughout its course, but somewhat diminutive. A diminutive interosseous artery is visualized to the level of the distal forearm. Reconstructed images demonstrate no additional findings. IMPRESSION: Vascular: 1. No evidence of acute vascular injury at the level of the known left humeral head and neck fracture. 2. Nonvisualization of the radial artery from the proximal forearm through the wrist, of uncertain acuity. The origin of the radial artery opacifies normally, and there is reconstitution of the radial artery at the level of the wrist. 3. Diminutive but patent radial artery is visualized throughout its course. Nonvascular: 1. Comminuted left humeral head and neck fracture not appreciably  changed since prior study. Severe inferior subluxation of the humeral head within the glenoid fossa. 2. Persistent soft tissue edema surrounding the left shoulder and extending into the proximal upper arm. No fluid collection or hematoma. Electronically Signed   By: Bobbye Burrow M.D.   On: 03/29/2024 19:01   MR HIP LEFT WO CONTRAST Result Date: 03/29/2024 CLINICAL DATA:  Hip trauma.  Fracture suspected. EXAM: MR OF THE LEFT HIP WITHOUT CONTRAST TECHNIQUE: Multiplanar, multisequence MR imaging was performed. No intravenous contrast was administered. COMPARISON:  Pelvis and left hip radiographs 03/27/2024, CT abdomen and pelvis 01/28/2016, CT abdomen pelvis 06/03/2014 FINDINGS: Bones: There is curvilinear decreased T1 and increased T2 with high-grade surrounding marrow edema indicating an acute to subacute nondisplaced fracture of the left pubic body (coronal series 3, image 27, axial series 5, image 20). Additional acute nondisplaced fracture with high-grade surrounding marrow edema of the mid AP dimension of the left inferior pubic ramus (axial series 5, image 25). Additional a vertical oriented linear decreased T1 increased T2 signal indicating an acute to subacute nondisplaced fracture of the medial aspect of the left iliac bone near the left sacroiliac joint (coronal  series 3 and 4 images 8 through 11). Articular cartilage and labrum Left hip: Articular cartilage: Moderate to high-grade anterior superior left femoral head and acetabular cartilage thinning. No left hip joint effusion. Labrum: Mild attenuation and degenerative irregularity of the anterior superior left acetabular labrum. Joint or bursal effusion Joint effusion: Tiny left hip joint effusion. No right hip joint effusion. Bursae: No trochanteric bursitis on either side. Muscles and tendons Muscles and tendons: Moderate edema throughout the left adductor muscles including the obturator externus and adductor longus, brevis, and magnus muscles  originating off of the left pubic body and inferior left pubic ramus. Mild edema within the right groin musculature originating off of the right pubic body and inferior right pubic ramus. Other findings Miscellaneous: Mild presacral fluid. The uterus appears surgically absent. IMPRESSION: 1. Acute to subacute nondisplaced fractures of the left pubic body and left inferior pubic ramus. 2. Acute to subacute nondisplaced vertically oriented fracture of the medial aspect of the left iliac bone near the left sacroiliac joint. This does not appear to extend into the sacroiliac joint. 3. Moderate edema throughout the left adductor muscles originating off of the left pubic body and inferior left pubic ramus. Mild edema within the contralateral right groin musculature. 4. Moderate to high-grade anterior superior left femoral head and acetabular cartilage thinning. Electronically Signed   By: Bertina Broccoli M.D.   On: 03/29/2024 18:02   DG Ankle Complete Left Result Date: 03/29/2024 CLINICAL DATA:  [id number] Ankle swelling, left [id number] EXAM: LEFT ANKLE COMPLETE - 3+ VIEW COMPARISON:  None Available. FINDINGS: No acute fracture or dislocation. No ankle mortise widening. The talar dome is intact. There is no evidence of arthropathy or other focal bone abnormality. Soft tissue swelling about the ankle. IMPRESSION: Soft tissue swelling about the ankle. Otherwise, no acute fracture or dislocation. Electronically Signed   By: Rance Burrows M.D.   On: 03/29/2024 15:22   CT Head Wo Contrast Result Date: 03/27/2024 CLINICAL DATA:  Recent fall with headaches, initial encounter EXAM: CT HEAD WITHOUT CONTRAST TECHNIQUE: Contiguous axial images were obtained from the base of the skull through the vertex without intravenous contrast. RADIATION DOSE REDUCTION: This exam was performed according to the departmental dose-optimization program which includes automated exposure control, adjustment of the mA and/or kV according to patient  size and/or use of iterative reconstruction technique. COMPARISON:  None Available. FINDINGS: Brain: No evidence of acute infarction, hemorrhage, hydrocephalus, extra-axial collection or mass lesion/mass effect. Mild atrophic changes are noted. Vascular: No hyperdense vessel or unexpected calcification. Skull: Normal. Negative for fracture or focal lesion. Sinuses/Orbits: No acute finding. Other: None. IMPRESSION: Mild atrophic changes without acute abnormality. Electronically Signed   By: Violeta Grey M.D.   On: 03/27/2024 19:35   CT Shoulder Left Wo Contrast Result Date: 03/27/2024 CLINICAL DATA:  Shoulder trauma after fall. EXAM: CT OF THE UPPER LEFT EXTREMITY WITHOUT CONTRAST TECHNIQUE: Multidetector CT imaging of the upper left extremity was performed according to the standard protocol. RADIATION DOSE REDUCTION: This exam was performed according to the departmental dose-optimization program which includes automated exposure control, adjustment of the mA and/or kV according to patient size and/or use of iterative reconstruction technique. COMPARISON:  Same day radiographs of the left shoulder at 3:49 p.m. FINDINGS: Bones/Joint/Cartilage Acute comminuted fracture of the left humeral head with transverse fracture component at the level of the surgical neck. There is full shaft width medial displacement of the distal humeral shaft component. Comminuted fracture margins extend through the humeral head articular  surface with impaction of the medial and inferior humeral head on the inferior glenoid. There is a displaced and rotated inferior humeral head articular surface fracture fragment adjacent to the inferior glenoid (series 3, image 44 and series 9, images 53-59). Fracture margins extend through the greater and lesser tuberosities. Inferior dislocation of the humeral head relative to the glenoid. There is a left glenohumeral joint effusion with lipohemarthrosis. Subchondral sclerosis and cystic changes of the  glenoid and humeral head are noted. The acromioclavicular joint is anatomically aligned with mild degenerative changes. Prior median sternotomy. Degenerative changes of the lower cervical spine. Ligaments Ligaments are suboptimally evaluated by CT. Muscles and Tendons Intramuscular edema of the left shoulder musculature, surrounding the left proximal humerus fracture described above. No discrete intramuscular collection. Soft tissue Soft tissue swelling subcutaneous edema extending along the left shoulder proximal left arm. Aortic atherosclerosis. IMPRESSION: 1. Acute comminuted and displaced intra-articular fracture of the left humeral head and neck, as described above. Inferior dislocation of the humeral head relative to the glenoid. 2. Associated soft tissue and intramuscular edema of the left shoulder. Electronically Signed   By: Mannie Seek M.D.   On: 03/27/2024 19:19   DG Hip Unilat W or Wo Pelvis 2-3 Views Left Result Date: 03/27/2024 CLINICAL DATA:  Pain after fall. EXAM: DG HIP (WITH OR WITHOUT PELVIS) 2-3V LEFT COMPARISON:  None Available. FINDINGS: No evidence of acute fracture of the pelvis or left hip. No hip dislocation. Mild bilateral hip osteoarthritis with joint space narrowing and spurring. The pubic rami are intact. Pubic symphysis and sacroiliac joints are congruent. Vascular calcifications are seen. IMPRESSION: 1. No acute fracture or subluxation of the pelvis or left hip. 2. Mild bilateral hip osteoarthritis. Electronically Signed   By: Chadwick Colonel M.D.   On: 03/27/2024 16:28   DG Shoulder Left Result Date: 03/27/2024 CLINICAL DATA:  Pain after fall.  Landed on left shoulder. EXAM: LEFT SHOULDER - 2+ VIEW COMPARISON:  None Available. FINDINGS: Comminuted displaced proximal humerus fracture. The dominant fracture plane is through the surgical neck with medial displacement of the humeral shaft. There is involvement of the lateral humeral head. The fracture extends to the inferior  aspect of the glenohumeral joint with a rotational component to the humeral head. There is widening of the upper aspect of the glenohumeral joint. No definite glenohumeral dislocation on provided views. Mild chronic acromioclavicular osteoarthritis. IMPRESSION: 1. Comminuted displaced proximal humerus fracture, fracture approaches the glenohumeral joint. 2. Widening of the upper aspect of the glenohumeral joint. No definite glenohumeral dislocation on provided views. Electronically Signed   By: Chadwick Colonel M.D.   On: 03/27/2024 16:26    DISCHARGE EXAMINATION: Vitals:   04/05/24 1643 04/05/24 2020 04/06/24 0430 04/06/24 0731  BP: (!) 124/59 (!) 141/54 (!) 120/58 (!) 154/74  Pulse: 74 69 (!) 57 84  Resp: 18 16 16 16   Temp: 98.6 F (37 C) 99 F (37.2 C) 98.4 F (36.9 C) 98.1 F (36.7 C)  TempSrc: Oral   Oral  SpO2: 100% 100% 100% 100%  Weight:      Height:       General appearance: Awake alert.  In no distress Resp: Clear to auscultation bilaterally.  Normal effort Cardio: S1-S2 is normal regular.  No S3-S4.  No rubs murmurs or bruit GI: Abdomen is soft.  Nontender nondistended.  Bowel sounds are present normal.  No masses organomegaly  DISPOSITION: SNF  Discharge Instructions     Call MD for:  difficulty breathing, headache  or visual disturbances   Complete by: As directed    Call MD for:  extreme fatigue   Complete by: As directed    Call MD for:  persistant dizziness or light-headedness   Complete by: As directed    Call MD for:  persistant nausea and vomiting   Complete by: As directed    Call MD for:  severe uncontrolled pain   Complete by: As directed    Call MD for:  temperature >100.4   Complete by: As directed    Diet general   Complete by: As directed    Discharge instructions   Complete by: As directed    Please review instructions on the discharge summary.  You were cared for by a hospitalist during your hospital stay. If you have any questions about your  discharge medications or the care you received while you were in the hospital after you are discharged, you can call the unit and asked to speak with the hospitalist on call if the hospitalist that took care of you is not available. Once you are discharged, your primary care physician will handle any further medical issues. Please note that NO REFILLS for any discharge medications will be authorized once you are discharged, as it is imperative that you return to your primary care physician (or establish a relationship with a primary care physician if you do not have one) for your aftercare needs so that they can reassess your need for medications and monitor your lab values. If you do not have a primary care physician, you can call [phone] for a physician referral.   Increase activity slowly   Complete by: As directed          Allergies as of 04/06/2024       Reactions   Hydrocodone Nausea Only   Statins Other (See Comments)   Myalgia   Amaryl [glimepiride] Other (See Comments)   Unknown reaction   Avandia [rosiglitazone] Other (See Comments)   Arthralgia   Crestor  [rosuvastatin ] Other (See Comments)   Myalgia    Ultram  [tramadol ] Other (See Comments)   Hallucinations   Welchol [colesevelam] Other (See Comments)   Sore throat   Zestril [lisinopril] Cough   Zetia  [ezetimibe ] Other (See Comments)   Myalgia    Zorprin [aspirin ]    Januvia [sitagliptin] Rash   Metformin And Related Other (See Comments)   Arthralgia   Vibra-tab [doxycycline] Swelling, Rash   Eye swelling   Wound Dressing Adhesive Rash   Medical tape adhesive        Medication List     STOP taking these medications    valACYclovir  1000 MG tablet Commonly known as: VALTREX        TAKE these medications    Abatacept  125 MG/ML Sosy Inject 1 Dose into the skin every 30 (thirty) days.   acetaminophen  325 MG tablet Commonly known as: TYLENOL  Take 2 tablets (650 mg total) by mouth every 6 (six) hours as  needed for moderate pain, fever or headache.   ALPRAZolam  0.25 MG tablet Commonly known as: XANAX  Take 1 tablet (0.25 mg total) by mouth daily as needed.   aspirin  81 MG tablet Take 1 tablet (81 mg total) by mouth daily. What changed: when to take this   B-D UF III MINI PEN NEEDLES 31G X 5 MM Misc Generic drug: Insulin  Pen Needle USE WITH INSULIN  TO INJECT BID UTD   clopidogrel  75 MG tablet Commonly known as: PLAVIX  TAKE 1 TABLET(75 MG) BY MOUTH DAILY  WITH BREAKFAST What changed: See the new instructions.   docusate sodium  100 MG capsule Commonly known as: COLACE Take 2 capsules (200 mg total) by mouth 2 (two) times daily.   folic acid  1 MG tablet Commonly known as: FOLVITE  Take 1 mg by mouth every evening.   furosemide  40 MG tablet Commonly known as: LASIX  Take 1 tablet (40 mg total) by mouth as needed for fluid (wt gain of 3 lbs in 24 hours.).   Insulin  Lispro Prot & Lispro (75-25) 100 UNIT/ML Kwikpen Commonly known as: HUMALOG 75/25 MIX Inject 3-5 Units into the skin in the morning.   isosorbide  mononitrate 60 MG 24 hr tablet Commonly known as: IMDUR  Take 1 tablet (60 mg total) by mouth daily.   losartan  25 MG tablet Commonly known as: COZAAR  TAKE 1 TABLET(25 MG) BY MOUTH DAILY What changed: See the new instructions.   methocarbamol  500 MG tablet Commonly known as: ROBAXIN  Take 1 tablet (500 mg total) by mouth every 6 (six) hours as needed for muscle spasms.   methotrexate  250 MG/10ML injection Inject 10 mg into the muscle once a week. On Tuesdays   metoprolol  tartrate 25 MG tablet Commonly known as: LOPRESSOR  Take 1 tablet (25 mg total) by mouth 2 (two) times daily. What changed: how much to take   multivitamin with minerals Tabs tablet Take 1 tablet by mouth daily. Centrum silver   nitroGLYCERIN  0.4 MG SL tablet Commonly known as: Nitrostat  Place 1 tablet (0.4 mg total) under the tongue every 5 (five) minutes as needed.   OneTouch Verio test  strip Generic drug: glucose blood USE 1 STRIP TID UTD   oxyCODONE  5 MG immediate release tablet Commonly known as: Oxy IR/ROXICODONE  Take 1 tablet (5 mg total) by mouth every 4 (four) hours as needed for severe pain (pain score 7-10).   pantoprazole  40 MG tablet Commonly known as: PROTONIX  Take 40 mg by mouth daily.   polyethylene glycol 17 g packet Commonly known as: MIRALAX  / GLYCOLAX  Take 17 g by mouth 2 (two) times daily.   potassium chloride  10 MEQ tablet Commonly known as: KLOR-CON  Take 1 tablet (10 mEq total) by mouth as needed (wt gain of 3 lbs in 24 hours to be taken with Lasix ).   senna-docusate 8.6-50 MG tablet Commonly known as: Senokot-S Take 3 tablets by mouth at bedtime.   Vitamin D -3 25 MCG (1000 UT) Caps Take 1,000 Units by mouth in the morning.          Follow-up Information     Pete Brand, DO Follow up.   Specialty: Family Medicine Contact information: [address] [place] [address] [place] [address] [phone]         Jasmine Mesi, MD. Schedule an appointment as soon as possible for a visit in 2 week(s).   Specialty: Orthopedic Surgery Why: post hospitalization follow up Contact information: [address]  [place] [address] [phone]                 TOTAL DISCHARGE TIME: 35 minutes  Terique Kawabata Lyndon Santiago  [facility] Pager on [email or web address]  04/06/2024, 10:59 AM

## 2024-04-06 NOTE — Discharge Instructions (Signed)
-  No lifting with the operative arm; no range of motion with the operative shoulder but okay for range of motion of the elbow, hand, wrist  Nonweightbearing for the left lower extremity due to multiple pelvic fractures that are nonoperative.  We will plan to keep her nonweightbearing for the first 3 weeks as we get out from this injury and then she can transition to weightbearing as tolerated.

## 2024-04-06 NOTE — Progress Notes (Signed)
 Report given to Polly Brink at [facility]. Patient waiting for PTAR at this time.

## 2024-04-06 NOTE — Plan of Care (Signed)
  Problem: Education: Goal: Knowledge of General Education information will improve Description: Including pain rating scale, medication(s)/side effects and non-pharmacologic comfort measures Outcome: Progressing   Problem: Health Behavior/Discharge Planning: Goal: Ability to manage health-related needs will improve Outcome: Progressing   Problem: Clinical Measurements: Goal: Ability to maintain clinical measurements within normal limits will improve Outcome: Progressing Goal: Will remain free from infection Outcome: Progressing Goal: Diagnostic test results will improve Outcome: Progressing Goal: Respiratory complications will improve Outcome: Progressing Goal: Cardiovascular complication will be avoided Outcome: Progressing   Problem: Activity: Goal: Risk for activity intolerance will decrease Outcome: Progressing   Problem: Nutrition: Goal: Adequate nutrition will be maintained Outcome: Progressing   Problem: Coping: Goal: Level of anxiety will decrease Outcome: Progressing   Problem: Elimination: Goal: Will not experience complications related to bowel motility Outcome: Progressing Goal: Will not experience complications related to urinary retention Outcome: Progressing   Problem: Pain Managment: Goal: General experience of comfort will improve and/or be controlled Outcome: Progressing   Problem: Safety: Goal: Ability to remain free from injury will improve Outcome: Progressing   Problem: Skin Integrity: Goal: Risk for impaired skin integrity will decrease Outcome: Progressing   Problem: Education: Goal: Ability to describe self-care measures that may prevent or decrease complications (Diabetes Survival Skills Education) will improve Outcome: Progressing Goal: Individualized Educational Video(s) Outcome: Progressing   Problem: Coping: Goal: Ability to adjust to condition or change in health will improve Outcome: Progressing   Problem: Fluid  Volume: Goal: Ability to maintain a balanced intake and output will improve Outcome: Progressing   Problem: Health Behavior/Discharge Planning: Goal: Ability to identify and utilize available resources and services will improve Outcome: Progressing Goal: Ability to manage health-related needs will improve Outcome: Progressing   Problem: Metabolic: Goal: Ability to maintain appropriate glucose levels will improve Outcome: Progressing   Problem: Nutritional: Goal: Maintenance of adequate nutrition will improve Outcome: Progressing Goal: Progress toward achieving an optimal weight will improve Outcome: Progressing   Problem: Skin Integrity: Goal: Risk for impaired skin integrity will decrease Outcome: Progressing   Problem: Tissue Perfusion: Goal: Adequacy of tissue perfusion will improve Outcome: Progressing   Problem: Education: Goal: Knowledge of the prescribed therapeutic regimen will improve Outcome: Progressing Goal: Understanding of activity limitations/precautions following surgery will improve Outcome: Progressing Goal: Individualized Educational Video(s) Outcome: Progressing   Problem: Activity: Goal: Ability to tolerate increased activity will improve Outcome: Progressing   Problem: Pain Management: Goal: Pain level will decrease with appropriate interventions Outcome: Progressing

## 2024-04-06 NOTE — TOC Progression Note (Addendum)
 Transition of Care [facility]) - Progression Note    Patient Details  Name: [name] [name] MRN: [id number] Date of Birth: September 08, 1941  Transition of [facility]) CM/SW Contact  Elspeth Hals, LCSW Phone Number: 04/06/2024, 9:41 AM  Clinical Narrative:   Two additional bed offers: [facility].  CSW LM with daughter to inform her.   1045: CSW spoke with daughter Lynnie Saucier Mardelle Shady) and she wants to accept offer at [facility].  Message with [facility] and they can receive pt today.   1145: SNF auth request submitted in navi and approved: [id number], 5 days: 5/22-5/26.  Expected Discharge Plan: Skilled Nursing Facility Barriers to Discharge: Continued Medical Work up, SNF Pending bed offer  Expected Discharge Plan and Services In-house Referral: Clinical Social Work   Post Acute Care Choice: Skilled Nursing Facility Living arrangements for the past 2 months: Single Family Home                                       Social Determinants of Health (SDOH) Interventions SDOH Screenings   Food Insecurity: No Food Insecurity (03/27/2024)  Housing: Low Risk  (03/27/2024)  Transportation Needs: No Transportation Needs (03/27/2024)  Utilities: Not At Risk (03/27/2024)  Social Connections: Moderately Integrated (03/27/2024)  Tobacco Use: Medium Risk (03/30/2024)    Readmission Risk Interventions    12/16/2021   10:59 AM  Readmission Risk Prevention Plan  Transportation Screening Complete  PCP or Specialist Appt within 5-7 Days Complete  Home Care Screening Complete  Medication Review (RN CM) Complete

## 2024-04-06 NOTE — TOC Transition Note (Signed)
 Transition of Care [facility]) - Discharge Note   Patient Details  Name: [name] [name] MRN: [id number] Date of Birth: 1941/04/30  [facility]) CM/SW Contact:  Elspeth Hals, LCSW Phone Number: 04/06/2024, 12:01 PM   Clinical Narrative:   Pt discharging to [facility], room 505B.  RN call report to [phone].  PTAR called 1200.     Final next level of care: Skilled Nursing Facility Barriers to Discharge: Barriers Resolved   Patient Goals and CMS Choice Patient states their goals for this hospitalization and ongoing recovery are:: be independent CMS Medicare.gov Compare Post Acute Care list provided to:: Patient Choice offered to / list presented to : Patient      Discharge Placement              Patient chooses bed at: [facility] Patient to be transferred to facility by: PTAR Name of family member notified: daughter [name] in room Patient and family notified of of transfer: 04/06/24  Discharge Plan and Services Additional resources added to the After Visit Summary for   In-house Referral: Clinical Social Work   Post Acute Care Choice: Skilled Nursing Facility                               Social Drivers of Health (SDOH) Interventions SDOH Screenings   Food Insecurity: No Food Insecurity (03/27/2024)  Housing: Low Risk  (03/27/2024)  Transportation Needs: No Transportation Needs (03/27/2024)  Utilities: Not At Risk (03/27/2024)  Social Connections: Moderately Integrated (03/27/2024)  Tobacco Use: Medium Risk (03/30/2024)     Readmission Risk Interventions    12/16/2021   10:59 AM  Readmission Risk Prevention Plan  Transportation Screening Complete  PCP or Specialist Appt within 5-7 Days Complete  Home Care Screening Complete  Medication Review (RN CM) Complete

## 2024-04-07 DIAGNOSIS — Z7901 Long term (current) use of anticoagulants: Secondary | ICD-10-CM | POA: Diagnosis not present

## 2024-04-07 DIAGNOSIS — E1165 Type 2 diabetes mellitus with hyperglycemia: Secondary | ICD-10-CM | POA: Diagnosis not present

## 2024-04-07 DIAGNOSIS — M069 Rheumatoid arthritis, unspecified: Secondary | ICD-10-CM | POA: Diagnosis not present

## 2024-04-07 DIAGNOSIS — B028 Zoster with other complications: Secondary | ICD-10-CM | POA: Diagnosis not present

## 2024-04-07 DIAGNOSIS — I10 Essential (primary) hypertension: Secondary | ICD-10-CM | POA: Diagnosis not present

## 2024-04-07 DIAGNOSIS — Z7982 Long term (current) use of aspirin: Secondary | ICD-10-CM | POA: Diagnosis not present

## 2024-04-07 DIAGNOSIS — D649 Anemia, unspecified: Secondary | ICD-10-CM | POA: Diagnosis not present

## 2024-04-11 DIAGNOSIS — D849 Immunodeficiency, unspecified: Secondary | ICD-10-CM | POA: Diagnosis not present

## 2024-04-11 DIAGNOSIS — R2689 Other abnormalities of gait and mobility: Secondary | ICD-10-CM | POA: Diagnosis not present

## 2024-04-11 DIAGNOSIS — M6281 Muscle weakness (generalized): Secondary | ICD-10-CM | POA: Diagnosis not present

## 2024-04-11 DIAGNOSIS — S42202D Unspecified fracture of upper end of left humerus, subsequent encounter for fracture with routine healing: Secondary | ICD-10-CM | POA: Diagnosis not present

## 2024-04-11 DIAGNOSIS — M069 Rheumatoid arthritis, unspecified: Secondary | ICD-10-CM | POA: Diagnosis not present

## 2024-04-11 DIAGNOSIS — S42412D Displaced simple supracondylar fracture without intercondylar fracture of left humerus, subsequent encounter for fracture with routine healing: Secondary | ICD-10-CM | POA: Diagnosis not present

## 2024-04-12 DIAGNOSIS — M069 Rheumatoid arthritis, unspecified: Secondary | ICD-10-CM | POA: Diagnosis not present

## 2024-04-12 DIAGNOSIS — D649 Anemia, unspecified: Secondary | ICD-10-CM | POA: Diagnosis not present

## 2024-04-12 DIAGNOSIS — I10 Essential (primary) hypertension: Secondary | ICD-10-CM | POA: Diagnosis not present

## 2024-04-12 DIAGNOSIS — Z7901 Long term (current) use of anticoagulants: Secondary | ICD-10-CM | POA: Diagnosis not present

## 2024-04-12 DIAGNOSIS — B028 Zoster with other complications: Secondary | ICD-10-CM | POA: Diagnosis not present

## 2024-04-12 DIAGNOSIS — E1165 Type 2 diabetes mellitus with hyperglycemia: Secondary | ICD-10-CM | POA: Diagnosis not present

## 2024-04-12 DIAGNOSIS — Z7982 Long term (current) use of aspirin: Secondary | ICD-10-CM | POA: Diagnosis not present

## 2024-04-14 DIAGNOSIS — E1165 Type 2 diabetes mellitus with hyperglycemia: Secondary | ICD-10-CM | POA: Diagnosis not present

## 2024-04-14 DIAGNOSIS — D649 Anemia, unspecified: Secondary | ICD-10-CM | POA: Diagnosis not present

## 2024-04-14 DIAGNOSIS — R2689 Other abnormalities of gait and mobility: Secondary | ICD-10-CM | POA: Diagnosis not present

## 2024-04-14 DIAGNOSIS — S42412D Displaced simple supracondylar fracture without intercondylar fracture of left humerus, subsequent encounter for fracture with routine healing: Secondary | ICD-10-CM | POA: Diagnosis not present

## 2024-04-14 DIAGNOSIS — D849 Immunodeficiency, unspecified: Secondary | ICD-10-CM | POA: Diagnosis not present

## 2024-04-14 DIAGNOSIS — B028 Zoster with other complications: Secondary | ICD-10-CM | POA: Diagnosis not present

## 2024-04-14 DIAGNOSIS — Z7982 Long term (current) use of aspirin: Secondary | ICD-10-CM | POA: Diagnosis not present

## 2024-04-14 DIAGNOSIS — M069 Rheumatoid arthritis, unspecified: Secondary | ICD-10-CM | POA: Diagnosis not present

## 2024-04-14 DIAGNOSIS — I10 Essential (primary) hypertension: Secondary | ICD-10-CM | POA: Diagnosis not present

## 2024-04-14 DIAGNOSIS — Z7901 Long term (current) use of anticoagulants: Secondary | ICD-10-CM | POA: Diagnosis not present

## 2024-04-14 DIAGNOSIS — M6281 Muscle weakness (generalized): Secondary | ICD-10-CM | POA: Diagnosis not present

## 2024-04-14 DIAGNOSIS — S42202D Unspecified fracture of upper end of left humerus, subsequent encounter for fracture with routine healing: Secondary | ICD-10-CM | POA: Diagnosis not present

## 2024-04-17 DIAGNOSIS — M069 Rheumatoid arthritis, unspecified: Secondary | ICD-10-CM | POA: Diagnosis not present

## 2024-04-17 DIAGNOSIS — D649 Anemia, unspecified: Secondary | ICD-10-CM | POA: Diagnosis not present

## 2024-04-17 DIAGNOSIS — S32592D Other specified fracture of left pubis, subsequent encounter for fracture with routine healing: Secondary | ICD-10-CM | POA: Diagnosis not present

## 2024-04-17 DIAGNOSIS — B029 Zoster without complications: Secondary | ICD-10-CM | POA: Diagnosis not present

## 2024-04-17 DIAGNOSIS — I10 Essential (primary) hypertension: Secondary | ICD-10-CM | POA: Diagnosis not present

## 2024-04-18 DIAGNOSIS — M069 Rheumatoid arthritis, unspecified: Secondary | ICD-10-CM | POA: Diagnosis not present

## 2024-04-18 DIAGNOSIS — I10 Essential (primary) hypertension: Secondary | ICD-10-CM | POA: Diagnosis not present

## 2024-04-18 DIAGNOSIS — R2689 Other abnormalities of gait and mobility: Secondary | ICD-10-CM | POA: Diagnosis not present

## 2024-04-18 DIAGNOSIS — D849 Immunodeficiency, unspecified: Secondary | ICD-10-CM | POA: Diagnosis not present

## 2024-04-18 DIAGNOSIS — S42412D Displaced simple supracondylar fracture without intercondylar fracture of left humerus, subsequent encounter for fracture with routine healing: Secondary | ICD-10-CM | POA: Diagnosis not present

## 2024-04-18 DIAGNOSIS — B029 Zoster without complications: Secondary | ICD-10-CM | POA: Diagnosis not present

## 2024-04-18 DIAGNOSIS — S42202D Unspecified fracture of upper end of left humerus, subsequent encounter for fracture with routine healing: Secondary | ICD-10-CM | POA: Diagnosis not present

## 2024-04-18 DIAGNOSIS — S32592D Other specified fracture of left pubis, subsequent encounter for fracture with routine healing: Secondary | ICD-10-CM | POA: Diagnosis not present

## 2024-04-18 DIAGNOSIS — D649 Anemia, unspecified: Secondary | ICD-10-CM | POA: Diagnosis not present

## 2024-04-18 DIAGNOSIS — M6281 Muscle weakness (generalized): Secondary | ICD-10-CM | POA: Diagnosis not present

## 2024-04-21 DIAGNOSIS — D849 Immunodeficiency, unspecified: Secondary | ICD-10-CM | POA: Diagnosis not present

## 2024-04-21 DIAGNOSIS — M6281 Muscle weakness (generalized): Secondary | ICD-10-CM | POA: Diagnosis not present

## 2024-04-21 DIAGNOSIS — I2510 Atherosclerotic heart disease of native coronary artery without angina pectoris: Secondary | ICD-10-CM | POA: Diagnosis not present

## 2024-04-21 DIAGNOSIS — R2689 Other abnormalities of gait and mobility: Secondary | ICD-10-CM | POA: Diagnosis not present

## 2024-04-21 DIAGNOSIS — S3289XD Fracture of other parts of pelvis, subsequent encounter for fracture with routine healing: Secondary | ICD-10-CM | POA: Diagnosis not present

## 2024-04-21 DIAGNOSIS — I10 Essential (primary) hypertension: Secondary | ICD-10-CM | POA: Diagnosis not present

## 2024-04-21 DIAGNOSIS — S42202D Unspecified fracture of upper end of left humerus, subsequent encounter for fracture with routine healing: Secondary | ICD-10-CM | POA: Diagnosis not present

## 2024-04-21 DIAGNOSIS — E119 Type 2 diabetes mellitus without complications: Secondary | ICD-10-CM | POA: Diagnosis not present

## 2024-04-21 DIAGNOSIS — M069 Rheumatoid arthritis, unspecified: Secondary | ICD-10-CM | POA: Diagnosis not present

## 2024-04-21 DIAGNOSIS — S42412D Displaced simple supracondylar fracture without intercondylar fracture of left humerus, subsequent encounter for fracture with routine healing: Secondary | ICD-10-CM | POA: Diagnosis not present

## 2024-04-21 DIAGNOSIS — B029 Zoster without complications: Secondary | ICD-10-CM | POA: Diagnosis not present

## 2024-04-21 DIAGNOSIS — D649 Anemia, unspecified: Secondary | ICD-10-CM | POA: Diagnosis not present

## 2024-04-21 DIAGNOSIS — Z96612 Presence of left artificial shoulder joint: Secondary | ICD-10-CM | POA: Diagnosis not present

## 2024-04-24 ENCOUNTER — Encounter (HOSPITAL_COMMUNITY): Payer: Self-pay | Admitting: Orthopedic Surgery

## 2024-04-25 DIAGNOSIS — S42202D Unspecified fracture of upper end of left humerus, subsequent encounter for fracture with routine healing: Secondary | ICD-10-CM | POA: Diagnosis not present

## 2024-04-25 DIAGNOSIS — D849 Immunodeficiency, unspecified: Secondary | ICD-10-CM | POA: Diagnosis not present

## 2024-04-25 DIAGNOSIS — S42412D Displaced simple supracondylar fracture without intercondylar fracture of left humerus, subsequent encounter for fracture with routine healing: Secondary | ICD-10-CM | POA: Diagnosis not present

## 2024-04-25 DIAGNOSIS — M6281 Muscle weakness (generalized): Secondary | ICD-10-CM | POA: Diagnosis not present

## 2024-04-25 DIAGNOSIS — M069 Rheumatoid arthritis, unspecified: Secondary | ICD-10-CM | POA: Diagnosis not present

## 2024-04-25 DIAGNOSIS — R2689 Other abnormalities of gait and mobility: Secondary | ICD-10-CM | POA: Diagnosis not present

## 2024-04-26 ENCOUNTER — Ambulatory Visit: Admitting: Surgical

## 2024-04-27 DIAGNOSIS — S3289XD Fracture of other parts of pelvis, subsequent encounter for fracture with routine healing: Secondary | ICD-10-CM | POA: Diagnosis not present

## 2024-04-27 DIAGNOSIS — D649 Anemia, unspecified: Secondary | ICD-10-CM | POA: Diagnosis not present

## 2024-04-27 DIAGNOSIS — I10 Essential (primary) hypertension: Secondary | ICD-10-CM | POA: Diagnosis not present

## 2024-04-27 DIAGNOSIS — Z96612 Presence of left artificial shoulder joint: Secondary | ICD-10-CM | POA: Diagnosis not present

## 2024-04-27 DIAGNOSIS — E119 Type 2 diabetes mellitus without complications: Secondary | ICD-10-CM | POA: Diagnosis not present

## 2024-04-27 DIAGNOSIS — M069 Rheumatoid arthritis, unspecified: Secondary | ICD-10-CM | POA: Diagnosis not present

## 2024-04-28 DIAGNOSIS — R2689 Other abnormalities of gait and mobility: Secondary | ICD-10-CM | POA: Diagnosis not present

## 2024-04-28 DIAGNOSIS — M069 Rheumatoid arthritis, unspecified: Secondary | ICD-10-CM | POA: Diagnosis not present

## 2024-04-28 DIAGNOSIS — E119 Type 2 diabetes mellitus without complications: Secondary | ICD-10-CM | POA: Diagnosis not present

## 2024-04-28 DIAGNOSIS — B029 Zoster without complications: Secondary | ICD-10-CM | POA: Diagnosis not present

## 2024-04-28 DIAGNOSIS — Z96612 Presence of left artificial shoulder joint: Secondary | ICD-10-CM | POA: Diagnosis not present

## 2024-04-28 DIAGNOSIS — M6281 Muscle weakness (generalized): Secondary | ICD-10-CM | POA: Diagnosis not present

## 2024-04-28 DIAGNOSIS — D849 Immunodeficiency, unspecified: Secondary | ICD-10-CM | POA: Diagnosis not present

## 2024-04-28 DIAGNOSIS — S42412D Displaced simple supracondylar fracture without intercondylar fracture of left humerus, subsequent encounter for fracture with routine healing: Secondary | ICD-10-CM | POA: Diagnosis not present

## 2024-04-28 DIAGNOSIS — I10 Essential (primary) hypertension: Secondary | ICD-10-CM | POA: Diagnosis not present

## 2024-04-28 DIAGNOSIS — I2510 Atherosclerotic heart disease of native coronary artery without angina pectoris: Secondary | ICD-10-CM | POA: Diagnosis not present

## 2024-04-28 DIAGNOSIS — D649 Anemia, unspecified: Secondary | ICD-10-CM | POA: Diagnosis not present

## 2024-04-28 DIAGNOSIS — S42202D Unspecified fracture of upper end of left humerus, subsequent encounter for fracture with routine healing: Secondary | ICD-10-CM | POA: Diagnosis not present

## 2024-04-28 DIAGNOSIS — S3289XD Fracture of other parts of pelvis, subsequent encounter for fracture with routine healing: Secondary | ICD-10-CM | POA: Diagnosis not present

## 2024-05-01 ENCOUNTER — Other Ambulatory Visit (INDEPENDENT_AMBULATORY_CARE_PROVIDER_SITE_OTHER): Payer: Self-pay

## 2024-05-01 ENCOUNTER — Ambulatory Visit: Admitting: Surgical

## 2024-05-01 DIAGNOSIS — Z96612 Presence of left artificial shoulder joint: Secondary | ICD-10-CM | POA: Diagnosis not present

## 2024-05-01 DIAGNOSIS — R102 Pelvic and perineal pain: Secondary | ICD-10-CM

## 2024-05-02 DIAGNOSIS — M069 Rheumatoid arthritis, unspecified: Secondary | ICD-10-CM | POA: Diagnosis not present

## 2024-05-02 DIAGNOSIS — M6281 Muscle weakness (generalized): Secondary | ICD-10-CM | POA: Diagnosis not present

## 2024-05-02 DIAGNOSIS — S42412D Displaced simple supracondylar fracture without intercondylar fracture of left humerus, subsequent encounter for fracture with routine healing: Secondary | ICD-10-CM | POA: Diagnosis not present

## 2024-05-02 DIAGNOSIS — S42202D Unspecified fracture of upper end of left humerus, subsequent encounter for fracture with routine healing: Secondary | ICD-10-CM | POA: Diagnosis not present

## 2024-05-02 DIAGNOSIS — D849 Immunodeficiency, unspecified: Secondary | ICD-10-CM | POA: Diagnosis not present

## 2024-05-02 DIAGNOSIS — R2689 Other abnormalities of gait and mobility: Secondary | ICD-10-CM | POA: Diagnosis not present

## 2024-05-05 DIAGNOSIS — S42412D Displaced simple supracondylar fracture without intercondylar fracture of left humerus, subsequent encounter for fracture with routine healing: Secondary | ICD-10-CM | POA: Diagnosis not present

## 2024-05-05 DIAGNOSIS — M069 Rheumatoid arthritis, unspecified: Secondary | ICD-10-CM | POA: Diagnosis not present

## 2024-05-05 DIAGNOSIS — D849 Immunodeficiency, unspecified: Secondary | ICD-10-CM | POA: Diagnosis not present

## 2024-05-05 DIAGNOSIS — M6281 Muscle weakness (generalized): Secondary | ICD-10-CM | POA: Diagnosis not present

## 2024-05-05 DIAGNOSIS — R2689 Other abnormalities of gait and mobility: Secondary | ICD-10-CM | POA: Diagnosis not present

## 2024-05-05 DIAGNOSIS — S42202D Unspecified fracture of upper end of left humerus, subsequent encounter for fracture with routine healing: Secondary | ICD-10-CM | POA: Diagnosis not present

## 2024-05-07 ENCOUNTER — Encounter: Payer: Self-pay | Admitting: Surgical
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
## 2024-05-09 DIAGNOSIS — M6281 Muscle weakness (generalized): Secondary | ICD-10-CM | POA: Diagnosis not present

## 2024-05-09 DIAGNOSIS — Z96612 Presence of left artificial shoulder joint: Secondary | ICD-10-CM | POA: Diagnosis not present

## 2024-05-09 DIAGNOSIS — Z5189 Encounter for other specified aftercare: Secondary | ICD-10-CM | POA: Diagnosis not present

## 2024-05-09 DIAGNOSIS — S42302D Unspecified fracture of shaft of humerus, left arm, subsequent encounter for fracture with routine healing: Secondary | ICD-10-CM | POA: Diagnosis not present

## 2024-05-09 DIAGNOSIS — S42412D Displaced simple supracondylar fracture without intercondylar fracture of left humerus, subsequent encounter for fracture with routine healing: Secondary | ICD-10-CM | POA: Diagnosis not present

## 2024-05-09 DIAGNOSIS — D849 Immunodeficiency, unspecified: Secondary | ICD-10-CM | POA: Diagnosis not present

## 2024-05-09 DIAGNOSIS — I10 Essential (primary) hypertension: Secondary | ICD-10-CM | POA: Diagnosis not present

## 2024-05-09 DIAGNOSIS — S42202D Unspecified fracture of upper end of left humerus, subsequent encounter for fracture with routine healing: Secondary | ICD-10-CM | POA: Diagnosis not present

## 2024-05-09 DIAGNOSIS — M069 Rheumatoid arthritis, unspecified: Secondary | ICD-10-CM | POA: Diagnosis not present

## 2024-05-09 DIAGNOSIS — D649 Anemia, unspecified: Secondary | ICD-10-CM | POA: Diagnosis not present

## 2024-05-09 DIAGNOSIS — E119 Type 2 diabetes mellitus without complications: Secondary | ICD-10-CM | POA: Diagnosis not present

## 2024-05-09 DIAGNOSIS — R2689 Other abnormalities of gait and mobility: Secondary | ICD-10-CM | POA: Diagnosis not present

## 2024-05-09 DIAGNOSIS — S3289XD Fracture of other parts of pelvis, subsequent encounter for fracture with routine healing: Secondary | ICD-10-CM | POA: Diagnosis not present

## 2024-05-12 DIAGNOSIS — I10 Essential (primary) hypertension: Secondary | ICD-10-CM | POA: Diagnosis not present

## 2024-05-12 DIAGNOSIS — E119 Type 2 diabetes mellitus without complications: Secondary | ICD-10-CM | POA: Diagnosis not present

## 2024-05-12 DIAGNOSIS — S329XXA Fracture of unspecified parts of lumbosacral spine and pelvis, initial encounter for closed fracture: Secondary | ICD-10-CM | POA: Diagnosis not present

## 2024-05-12 DIAGNOSIS — M069 Rheumatoid arthritis, unspecified: Secondary | ICD-10-CM | POA: Diagnosis not present

## 2024-05-12 DIAGNOSIS — D649 Anemia, unspecified: Secondary | ICD-10-CM | POA: Diagnosis not present

## 2024-05-12 DIAGNOSIS — Z7901 Long term (current) use of anticoagulants: Secondary | ICD-10-CM | POA: Diagnosis not present

## 2024-05-13 DIAGNOSIS — S42412D Displaced simple supracondylar fracture without intercondylar fracture of left humerus, subsequent encounter for fracture with routine healing: Secondary | ICD-10-CM | POA: Diagnosis not present

## 2024-05-13 DIAGNOSIS — S42202D Unspecified fracture of upper end of left humerus, subsequent encounter for fracture with routine healing: Secondary | ICD-10-CM | POA: Diagnosis not present

## 2024-05-14 DIAGNOSIS — E1159 Type 2 diabetes mellitus with other circulatory complications: Secondary | ICD-10-CM | POA: Diagnosis not present

## 2024-05-14 DIAGNOSIS — S42412D Displaced simple supracondylar fracture without intercondylar fracture of left humerus, subsequent encounter for fracture with routine healing: Secondary | ICD-10-CM | POA: Diagnosis not present

## 2024-05-14 DIAGNOSIS — E1122 Type 2 diabetes mellitus with diabetic chronic kidney disease: Secondary | ICD-10-CM | POA: Diagnosis not present

## 2024-05-14 DIAGNOSIS — S42202D Unspecified fracture of upper end of left humerus, subsequent encounter for fracture with routine healing: Secondary | ICD-10-CM | POA: Diagnosis not present

## 2024-05-14 DIAGNOSIS — Z794 Long term (current) use of insulin: Secondary | ICD-10-CM | POA: Diagnosis not present

## 2024-05-14 DIAGNOSIS — M069 Rheumatoid arthritis, unspecified: Secondary | ICD-10-CM | POA: Diagnosis not present

## 2024-05-14 DIAGNOSIS — I129 Hypertensive chronic kidney disease with stage 1 through stage 4 chronic kidney disease, or unspecified chronic kidney disease: Secondary | ICD-10-CM | POA: Diagnosis not present

## 2024-05-14 DIAGNOSIS — S3289XD Fracture of other parts of pelvis, subsequent encounter for fracture with routine healing: Secondary | ICD-10-CM | POA: Diagnosis not present

## 2024-05-14 DIAGNOSIS — N1831 Chronic kidney disease, stage 3a: Secondary | ICD-10-CM | POA: Diagnosis not present

## 2024-05-15 DIAGNOSIS — Z09 Encounter for follow-up examination after completed treatment for conditions other than malignant neoplasm: Secondary | ICD-10-CM | POA: Diagnosis not present

## 2024-05-15 DIAGNOSIS — W19XXXA Unspecified fall, initial encounter: Secondary | ICD-10-CM | POA: Diagnosis not present

## 2024-05-15 DIAGNOSIS — M0589 Other rheumatoid arthritis with rheumatoid factor of multiple sites: Secondary | ICD-10-CM | POA: Diagnosis not present

## 2024-05-23 DIAGNOSIS — Z794 Long term (current) use of insulin: Secondary | ICD-10-CM | POA: Diagnosis not present

## 2024-05-23 DIAGNOSIS — S42412D Displaced simple supracondylar fracture without intercondylar fracture of left humerus, subsequent encounter for fracture with routine healing: Secondary | ICD-10-CM | POA: Diagnosis not present

## 2024-05-23 DIAGNOSIS — S3289XD Fracture of other parts of pelvis, subsequent encounter for fracture with routine healing: Secondary | ICD-10-CM | POA: Diagnosis not present

## 2024-05-23 DIAGNOSIS — I129 Hypertensive chronic kidney disease with stage 1 through stage 4 chronic kidney disease, or unspecified chronic kidney disease: Secondary | ICD-10-CM | POA: Diagnosis not present

## 2024-05-23 DIAGNOSIS — S42202D Unspecified fracture of upper end of left humerus, subsequent encounter for fracture with routine healing: Secondary | ICD-10-CM | POA: Diagnosis not present

## 2024-05-23 DIAGNOSIS — M069 Rheumatoid arthritis, unspecified: Secondary | ICD-10-CM | POA: Diagnosis not present

## 2024-05-23 DIAGNOSIS — N1831 Chronic kidney disease, stage 3a: Secondary | ICD-10-CM | POA: Diagnosis not present

## 2024-05-23 DIAGNOSIS — E1122 Type 2 diabetes mellitus with diabetic chronic kidney disease: Secondary | ICD-10-CM | POA: Diagnosis not present

## 2024-05-23 DIAGNOSIS — E1159 Type 2 diabetes mellitus with other circulatory complications: Secondary | ICD-10-CM | POA: Diagnosis not present

## 2024-05-24 DIAGNOSIS — S42202D Unspecified fracture of upper end of left humerus, subsequent encounter for fracture with routine healing: Secondary | ICD-10-CM | POA: Diagnosis not present

## 2024-05-24 DIAGNOSIS — S3289XD Fracture of other parts of pelvis, subsequent encounter for fracture with routine healing: Secondary | ICD-10-CM | POA: Diagnosis not present

## 2024-05-24 DIAGNOSIS — M069 Rheumatoid arthritis, unspecified: Secondary | ICD-10-CM | POA: Diagnosis not present

## 2024-05-24 DIAGNOSIS — I129 Hypertensive chronic kidney disease with stage 1 through stage 4 chronic kidney disease, or unspecified chronic kidney disease: Secondary | ICD-10-CM | POA: Diagnosis not present

## 2024-05-24 DIAGNOSIS — S42412D Displaced simple supracondylar fracture without intercondylar fracture of left humerus, subsequent encounter for fracture with routine healing: Secondary | ICD-10-CM | POA: Diagnosis not present

## 2024-05-24 DIAGNOSIS — E1159 Type 2 diabetes mellitus with other circulatory complications: Secondary | ICD-10-CM | POA: Diagnosis not present

## 2024-05-24 DIAGNOSIS — Z794 Long term (current) use of insulin: Secondary | ICD-10-CM | POA: Diagnosis not present

## 2024-05-24 DIAGNOSIS — N1831 Chronic kidney disease, stage 3a: Secondary | ICD-10-CM | POA: Diagnosis not present

## 2024-05-24 DIAGNOSIS — E1122 Type 2 diabetes mellitus with diabetic chronic kidney disease: Secondary | ICD-10-CM | POA: Diagnosis not present

## 2024-05-25 DIAGNOSIS — N1831 Chronic kidney disease, stage 3a: Secondary | ICD-10-CM | POA: Diagnosis not present

## 2024-05-25 DIAGNOSIS — S3289XD Fracture of other parts of pelvis, subsequent encounter for fracture with routine healing: Secondary | ICD-10-CM | POA: Diagnosis not present

## 2024-05-25 DIAGNOSIS — E1122 Type 2 diabetes mellitus with diabetic chronic kidney disease: Secondary | ICD-10-CM | POA: Diagnosis not present

## 2024-05-25 DIAGNOSIS — E1159 Type 2 diabetes mellitus with other circulatory complications: Secondary | ICD-10-CM | POA: Diagnosis not present

## 2024-05-25 DIAGNOSIS — S42202D Unspecified fracture of upper end of left humerus, subsequent encounter for fracture with routine healing: Secondary | ICD-10-CM | POA: Diagnosis not present

## 2024-05-25 DIAGNOSIS — M069 Rheumatoid arthritis, unspecified: Secondary | ICD-10-CM | POA: Diagnosis not present

## 2024-05-25 DIAGNOSIS — Z794 Long term (current) use of insulin: Secondary | ICD-10-CM | POA: Diagnosis not present

## 2024-05-25 DIAGNOSIS — S42412D Displaced simple supracondylar fracture without intercondylar fracture of left humerus, subsequent encounter for fracture with routine healing: Secondary | ICD-10-CM | POA: Diagnosis not present

## 2024-05-25 DIAGNOSIS — I129 Hypertensive chronic kidney disease with stage 1 through stage 4 chronic kidney disease, or unspecified chronic kidney disease: Secondary | ICD-10-CM | POA: Diagnosis not present

## 2024-05-26 ENCOUNTER — Telehealth: Payer: Self-pay | Admitting: Orthopedic Surgery

## 2024-05-26 DIAGNOSIS — S42412D Displaced simple supracondylar fracture without intercondylar fracture of left humerus, subsequent encounter for fracture with routine healing: Secondary | ICD-10-CM | POA: Diagnosis not present

## 2024-05-26 DIAGNOSIS — I129 Hypertensive chronic kidney disease with stage 1 through stage 4 chronic kidney disease, or unspecified chronic kidney disease: Secondary | ICD-10-CM | POA: Diagnosis not present

## 2024-05-26 DIAGNOSIS — N1831 Chronic kidney disease, stage 3a: Secondary | ICD-10-CM | POA: Diagnosis not present

## 2024-05-26 DIAGNOSIS — Z794 Long term (current) use of insulin: Secondary | ICD-10-CM | POA: Diagnosis not present

## 2024-05-26 DIAGNOSIS — S3289XD Fracture of other parts of pelvis, subsequent encounter for fracture with routine healing: Secondary | ICD-10-CM | POA: Diagnosis not present

## 2024-05-26 DIAGNOSIS — M069 Rheumatoid arthritis, unspecified: Secondary | ICD-10-CM | POA: Diagnosis not present

## 2024-05-26 DIAGNOSIS — E1122 Type 2 diabetes mellitus with diabetic chronic kidney disease: Secondary | ICD-10-CM | POA: Diagnosis not present

## 2024-05-26 DIAGNOSIS — E1159 Type 2 diabetes mellitus with other circulatory complications: Secondary | ICD-10-CM | POA: Diagnosis not present

## 2024-05-26 DIAGNOSIS — S42202D Unspecified fracture of upper end of left humerus, subsequent encounter for fracture with routine healing: Secondary | ICD-10-CM | POA: Diagnosis not present

## 2024-05-26 NOTE — Telephone Encounter (Signed)
 Beverley with [facility] reached out to me as well about orders. I sent him last OV note, which was sufficient for them for treating patient. No need to send anything else. Thanks.

## 2024-05-26 NOTE — Telephone Encounter (Signed)
 Beverley Investment banker, operational) called for protocol for reverse total shoulderreplacement. Beverley secure number is [phone].

## 2024-05-26 NOTE — Telephone Encounter (Signed)
 Added note Beverley is from [facility]

## 2024-05-29 DIAGNOSIS — S42412D Displaced simple supracondylar fracture without intercondylar fracture of left humerus, subsequent encounter for fracture with routine healing: Secondary | ICD-10-CM | POA: Diagnosis not present

## 2024-05-29 DIAGNOSIS — N1831 Chronic kidney disease, stage 3a: Secondary | ICD-10-CM | POA: Diagnosis not present

## 2024-05-29 DIAGNOSIS — S3289XD Fracture of other parts of pelvis, subsequent encounter for fracture with routine healing: Secondary | ICD-10-CM | POA: Diagnosis not present

## 2024-05-29 DIAGNOSIS — Z794 Long term (current) use of insulin: Secondary | ICD-10-CM | POA: Diagnosis not present

## 2024-05-29 DIAGNOSIS — I129 Hypertensive chronic kidney disease with stage 1 through stage 4 chronic kidney disease, or unspecified chronic kidney disease: Secondary | ICD-10-CM | POA: Diagnosis not present

## 2024-05-29 DIAGNOSIS — S42202D Unspecified fracture of upper end of left humerus, subsequent encounter for fracture with routine healing: Secondary | ICD-10-CM | POA: Diagnosis not present

## 2024-05-29 DIAGNOSIS — E1122 Type 2 diabetes mellitus with diabetic chronic kidney disease: Secondary | ICD-10-CM | POA: Diagnosis not present

## 2024-05-29 DIAGNOSIS — E1159 Type 2 diabetes mellitus with other circulatory complications: Secondary | ICD-10-CM | POA: Diagnosis not present

## 2024-05-29 DIAGNOSIS — M069 Rheumatoid arthritis, unspecified: Secondary | ICD-10-CM | POA: Diagnosis not present

## 2024-05-31 DIAGNOSIS — M069 Rheumatoid arthritis, unspecified: Secondary | ICD-10-CM | POA: Diagnosis not present

## 2024-05-31 DIAGNOSIS — S3289XD Fracture of other parts of pelvis, subsequent encounter for fracture with routine healing: Secondary | ICD-10-CM | POA: Diagnosis not present

## 2024-05-31 DIAGNOSIS — I129 Hypertensive chronic kidney disease with stage 1 through stage 4 chronic kidney disease, or unspecified chronic kidney disease: Secondary | ICD-10-CM | POA: Diagnosis not present

## 2024-05-31 DIAGNOSIS — S42202D Unspecified fracture of upper end of left humerus, subsequent encounter for fracture with routine healing: Secondary | ICD-10-CM | POA: Diagnosis not present

## 2024-05-31 DIAGNOSIS — Z794 Long term (current) use of insulin: Secondary | ICD-10-CM | POA: Diagnosis not present

## 2024-05-31 DIAGNOSIS — N1831 Chronic kidney disease, stage 3a: Secondary | ICD-10-CM | POA: Diagnosis not present

## 2024-05-31 DIAGNOSIS — S42412D Displaced simple supracondylar fracture without intercondylar fracture of left humerus, subsequent encounter for fracture with routine healing: Secondary | ICD-10-CM | POA: Diagnosis not present

## 2024-05-31 DIAGNOSIS — E1122 Type 2 diabetes mellitus with diabetic chronic kidney disease: Secondary | ICD-10-CM | POA: Diagnosis not present

## 2024-05-31 DIAGNOSIS — E1159 Type 2 diabetes mellitus with other circulatory complications: Secondary | ICD-10-CM | POA: Diagnosis not present

## 2024-06-01 DIAGNOSIS — N1831 Chronic kidney disease, stage 3a: Secondary | ICD-10-CM | POA: Diagnosis not present

## 2024-06-01 DIAGNOSIS — M069 Rheumatoid arthritis, unspecified: Secondary | ICD-10-CM | POA: Diagnosis not present

## 2024-06-01 DIAGNOSIS — S3289XD Fracture of other parts of pelvis, subsequent encounter for fracture with routine healing: Secondary | ICD-10-CM | POA: Diagnosis not present

## 2024-06-01 DIAGNOSIS — E1122 Type 2 diabetes mellitus with diabetic chronic kidney disease: Secondary | ICD-10-CM | POA: Diagnosis not present

## 2024-06-01 DIAGNOSIS — S42202D Unspecified fracture of upper end of left humerus, subsequent encounter for fracture with routine healing: Secondary | ICD-10-CM | POA: Diagnosis not present

## 2024-06-01 DIAGNOSIS — E1159 Type 2 diabetes mellitus with other circulatory complications: Secondary | ICD-10-CM | POA: Diagnosis not present

## 2024-06-01 DIAGNOSIS — S42412D Displaced simple supracondylar fracture without intercondylar fracture of left humerus, subsequent encounter for fracture with routine healing: Secondary | ICD-10-CM | POA: Diagnosis not present

## 2024-06-01 DIAGNOSIS — Z794 Long term (current) use of insulin: Secondary | ICD-10-CM | POA: Diagnosis not present

## 2024-06-01 DIAGNOSIS — I129 Hypertensive chronic kidney disease with stage 1 through stage 4 chronic kidney disease, or unspecified chronic kidney disease: Secondary | ICD-10-CM | POA: Diagnosis not present

## 2024-06-02 DIAGNOSIS — E1159 Type 2 diabetes mellitus with other circulatory complications: Secondary | ICD-10-CM | POA: Diagnosis not present

## 2024-06-02 DIAGNOSIS — I129 Hypertensive chronic kidney disease with stage 1 through stage 4 chronic kidney disease, or unspecified chronic kidney disease: Secondary | ICD-10-CM | POA: Diagnosis not present

## 2024-06-02 DIAGNOSIS — S42202D Unspecified fracture of upper end of left humerus, subsequent encounter for fracture with routine healing: Secondary | ICD-10-CM | POA: Diagnosis not present

## 2024-06-02 DIAGNOSIS — E1122 Type 2 diabetes mellitus with diabetic chronic kidney disease: Secondary | ICD-10-CM | POA: Diagnosis not present

## 2024-06-02 DIAGNOSIS — S42412D Displaced simple supracondylar fracture without intercondylar fracture of left humerus, subsequent encounter for fracture with routine healing: Secondary | ICD-10-CM | POA: Diagnosis not present

## 2024-06-02 DIAGNOSIS — N1831 Chronic kidney disease, stage 3a: Secondary | ICD-10-CM | POA: Diagnosis not present

## 2024-06-02 DIAGNOSIS — Z794 Long term (current) use of insulin: Secondary | ICD-10-CM | POA: Diagnosis not present

## 2024-06-02 DIAGNOSIS — M069 Rheumatoid arthritis, unspecified: Secondary | ICD-10-CM | POA: Diagnosis not present

## 2024-06-02 DIAGNOSIS — S3289XD Fracture of other parts of pelvis, subsequent encounter for fracture with routine healing: Secondary | ICD-10-CM | POA: Diagnosis not present

## 2024-06-05 DIAGNOSIS — S42412D Displaced simple supracondylar fracture without intercondylar fracture of left humerus, subsequent encounter for fracture with routine healing: Secondary | ICD-10-CM | POA: Diagnosis not present

## 2024-06-05 DIAGNOSIS — S42202D Unspecified fracture of upper end of left humerus, subsequent encounter for fracture with routine healing: Secondary | ICD-10-CM | POA: Diagnosis not present

## 2024-06-05 DIAGNOSIS — N1831 Chronic kidney disease, stage 3a: Secondary | ICD-10-CM | POA: Diagnosis not present

## 2024-06-05 DIAGNOSIS — E1122 Type 2 diabetes mellitus with diabetic chronic kidney disease: Secondary | ICD-10-CM | POA: Diagnosis not present

## 2024-06-05 DIAGNOSIS — M069 Rheumatoid arthritis, unspecified: Secondary | ICD-10-CM | POA: Diagnosis not present

## 2024-06-05 DIAGNOSIS — I129 Hypertensive chronic kidney disease with stage 1 through stage 4 chronic kidney disease, or unspecified chronic kidney disease: Secondary | ICD-10-CM | POA: Diagnosis not present

## 2024-06-05 DIAGNOSIS — E1159 Type 2 diabetes mellitus with other circulatory complications: Secondary | ICD-10-CM | POA: Diagnosis not present

## 2024-06-05 DIAGNOSIS — S3289XD Fracture of other parts of pelvis, subsequent encounter for fracture with routine healing: Secondary | ICD-10-CM | POA: Diagnosis not present

## 2024-06-05 DIAGNOSIS — Z794 Long term (current) use of insulin: Secondary | ICD-10-CM | POA: Diagnosis not present

## 2024-06-07 DIAGNOSIS — S42202D Unspecified fracture of upper end of left humerus, subsequent encounter for fracture with routine healing: Secondary | ICD-10-CM | POA: Diagnosis not present

## 2024-06-07 DIAGNOSIS — S42412D Displaced simple supracondylar fracture without intercondylar fracture of left humerus, subsequent encounter for fracture with routine healing: Secondary | ICD-10-CM | POA: Diagnosis not present

## 2024-06-07 DIAGNOSIS — M069 Rheumatoid arthritis, unspecified: Secondary | ICD-10-CM | POA: Diagnosis not present

## 2024-06-07 DIAGNOSIS — Z794 Long term (current) use of insulin: Secondary | ICD-10-CM | POA: Diagnosis not present

## 2024-06-07 DIAGNOSIS — E1122 Type 2 diabetes mellitus with diabetic chronic kidney disease: Secondary | ICD-10-CM | POA: Diagnosis not present

## 2024-06-07 DIAGNOSIS — S3289XD Fracture of other parts of pelvis, subsequent encounter for fracture with routine healing: Secondary | ICD-10-CM | POA: Diagnosis not present

## 2024-06-07 DIAGNOSIS — I129 Hypertensive chronic kidney disease with stage 1 through stage 4 chronic kidney disease, or unspecified chronic kidney disease: Secondary | ICD-10-CM | POA: Diagnosis not present

## 2024-06-07 DIAGNOSIS — E1159 Type 2 diabetes mellitus with other circulatory complications: Secondary | ICD-10-CM | POA: Diagnosis not present

## 2024-06-07 DIAGNOSIS — N1831 Chronic kidney disease, stage 3a: Secondary | ICD-10-CM | POA: Diagnosis not present

## 2024-06-08 DIAGNOSIS — S3289XD Fracture of other parts of pelvis, subsequent encounter for fracture with routine healing: Secondary | ICD-10-CM | POA: Diagnosis not present

## 2024-06-08 DIAGNOSIS — Z794 Long term (current) use of insulin: Secondary | ICD-10-CM | POA: Diagnosis not present

## 2024-06-08 DIAGNOSIS — E1122 Type 2 diabetes mellitus with diabetic chronic kidney disease: Secondary | ICD-10-CM | POA: Diagnosis not present

## 2024-06-08 DIAGNOSIS — M069 Rheumatoid arthritis, unspecified: Secondary | ICD-10-CM | POA: Diagnosis not present

## 2024-06-08 DIAGNOSIS — E1159 Type 2 diabetes mellitus with other circulatory complications: Secondary | ICD-10-CM | POA: Diagnosis not present

## 2024-06-08 DIAGNOSIS — S42202D Unspecified fracture of upper end of left humerus, subsequent encounter for fracture with routine healing: Secondary | ICD-10-CM | POA: Diagnosis not present

## 2024-06-08 DIAGNOSIS — I129 Hypertensive chronic kidney disease with stage 1 through stage 4 chronic kidney disease, or unspecified chronic kidney disease: Secondary | ICD-10-CM | POA: Diagnosis not present

## 2024-06-08 DIAGNOSIS — S42412D Displaced simple supracondylar fracture without intercondylar fracture of left humerus, subsequent encounter for fracture with routine healing: Secondary | ICD-10-CM | POA: Diagnosis not present

## 2024-06-08 DIAGNOSIS — N1831 Chronic kidney disease, stage 3a: Secondary | ICD-10-CM | POA: Diagnosis not present

## 2024-06-12 ENCOUNTER — Ambulatory Visit: Admitting: Surgical

## 2024-06-12 DIAGNOSIS — M0589 Other rheumatoid arthritis with rheumatoid factor of multiple sites: Secondary | ICD-10-CM | POA: Diagnosis not present

## 2024-06-12 DIAGNOSIS — S42202D Unspecified fracture of upper end of left humerus, subsequent encounter for fracture with routine healing: Secondary | ICD-10-CM | POA: Diagnosis not present

## 2024-06-12 DIAGNOSIS — S42412D Displaced simple supracondylar fracture without intercondylar fracture of left humerus, subsequent encounter for fracture with routine healing: Secondary | ICD-10-CM | POA: Diagnosis not present

## 2024-06-13 DIAGNOSIS — S3289XD Fracture of other parts of pelvis, subsequent encounter for fracture with routine healing: Secondary | ICD-10-CM | POA: Diagnosis not present

## 2024-06-13 DIAGNOSIS — E1159 Type 2 diabetes mellitus with other circulatory complications: Secondary | ICD-10-CM | POA: Diagnosis not present

## 2024-06-13 DIAGNOSIS — E1122 Type 2 diabetes mellitus with diabetic chronic kidney disease: Secondary | ICD-10-CM | POA: Diagnosis not present

## 2024-06-13 DIAGNOSIS — I129 Hypertensive chronic kidney disease with stage 1 through stage 4 chronic kidney disease, or unspecified chronic kidney disease: Secondary | ICD-10-CM | POA: Diagnosis not present

## 2024-06-13 DIAGNOSIS — Z794 Long term (current) use of insulin: Secondary | ICD-10-CM | POA: Diagnosis not present

## 2024-06-13 DIAGNOSIS — S42412D Displaced simple supracondylar fracture without intercondylar fracture of left humerus, subsequent encounter for fracture with routine healing: Secondary | ICD-10-CM | POA: Diagnosis not present

## 2024-06-13 DIAGNOSIS — M069 Rheumatoid arthritis, unspecified: Secondary | ICD-10-CM | POA: Diagnosis not present

## 2024-06-13 DIAGNOSIS — N1831 Chronic kidney disease, stage 3a: Secondary | ICD-10-CM | POA: Diagnosis not present

## 2024-06-13 DIAGNOSIS — S42202D Unspecified fracture of upper end of left humerus, subsequent encounter for fracture with routine healing: Secondary | ICD-10-CM | POA: Diagnosis not present

## 2024-06-14 DIAGNOSIS — M069 Rheumatoid arthritis, unspecified: Secondary | ICD-10-CM | POA: Diagnosis not present

## 2024-06-14 DIAGNOSIS — N1831 Chronic kidney disease, stage 3a: Secondary | ICD-10-CM | POA: Diagnosis not present

## 2024-06-14 DIAGNOSIS — S42412D Displaced simple supracondylar fracture without intercondylar fracture of left humerus, subsequent encounter for fracture with routine healing: Secondary | ICD-10-CM | POA: Diagnosis not present

## 2024-06-14 DIAGNOSIS — S42202D Unspecified fracture of upper end of left humerus, subsequent encounter for fracture with routine healing: Secondary | ICD-10-CM | POA: Diagnosis not present

## 2024-06-14 DIAGNOSIS — E1122 Type 2 diabetes mellitus with diabetic chronic kidney disease: Secondary | ICD-10-CM | POA: Diagnosis not present

## 2024-06-14 DIAGNOSIS — I129 Hypertensive chronic kidney disease with stage 1 through stage 4 chronic kidney disease, or unspecified chronic kidney disease: Secondary | ICD-10-CM | POA: Diagnosis not present

## 2024-06-14 DIAGNOSIS — E1159 Type 2 diabetes mellitus with other circulatory complications: Secondary | ICD-10-CM | POA: Diagnosis not present

## 2024-06-14 DIAGNOSIS — S3289XD Fracture of other parts of pelvis, subsequent encounter for fracture with routine healing: Secondary | ICD-10-CM | POA: Diagnosis not present

## 2024-06-14 DIAGNOSIS — Z794 Long term (current) use of insulin: Secondary | ICD-10-CM | POA: Diagnosis not present

## 2024-06-15 DIAGNOSIS — S42202D Unspecified fracture of upper end of left humerus, subsequent encounter for fracture with routine healing: Secondary | ICD-10-CM | POA: Diagnosis not present

## 2024-06-15 DIAGNOSIS — S3289XD Fracture of other parts of pelvis, subsequent encounter for fracture with routine healing: Secondary | ICD-10-CM | POA: Diagnosis not present

## 2024-06-15 DIAGNOSIS — M069 Rheumatoid arthritis, unspecified: Secondary | ICD-10-CM | POA: Diagnosis not present

## 2024-06-15 DIAGNOSIS — E1122 Type 2 diabetes mellitus with diabetic chronic kidney disease: Secondary | ICD-10-CM | POA: Diagnosis not present

## 2024-06-15 DIAGNOSIS — Z794 Long term (current) use of insulin: Secondary | ICD-10-CM | POA: Diagnosis not present

## 2024-06-15 DIAGNOSIS — S42412D Displaced simple supracondylar fracture without intercondylar fracture of left humerus, subsequent encounter for fracture with routine healing: Secondary | ICD-10-CM | POA: Diagnosis not present

## 2024-06-15 DIAGNOSIS — E1159 Type 2 diabetes mellitus with other circulatory complications: Secondary | ICD-10-CM | POA: Diagnosis not present

## 2024-06-15 DIAGNOSIS — N1831 Chronic kidney disease, stage 3a: Secondary | ICD-10-CM | POA: Diagnosis not present

## 2024-06-15 DIAGNOSIS — I129 Hypertensive chronic kidney disease with stage 1 through stage 4 chronic kidney disease, or unspecified chronic kidney disease: Secondary | ICD-10-CM | POA: Diagnosis not present

## 2024-06-19 DIAGNOSIS — I129 Hypertensive chronic kidney disease with stage 1 through stage 4 chronic kidney disease, or unspecified chronic kidney disease: Secondary | ICD-10-CM | POA: Diagnosis not present

## 2024-06-19 DIAGNOSIS — M069 Rheumatoid arthritis, unspecified: Secondary | ICD-10-CM | POA: Diagnosis not present

## 2024-06-19 DIAGNOSIS — S42412D Displaced simple supracondylar fracture without intercondylar fracture of left humerus, subsequent encounter for fracture with routine healing: Secondary | ICD-10-CM | POA: Diagnosis not present

## 2024-06-19 DIAGNOSIS — Z794 Long term (current) use of insulin: Secondary | ICD-10-CM | POA: Diagnosis not present

## 2024-06-19 DIAGNOSIS — S42202D Unspecified fracture of upper end of left humerus, subsequent encounter for fracture with routine healing: Secondary | ICD-10-CM | POA: Diagnosis not present

## 2024-06-19 DIAGNOSIS — E1122 Type 2 diabetes mellitus with diabetic chronic kidney disease: Secondary | ICD-10-CM | POA: Diagnosis not present

## 2024-06-19 DIAGNOSIS — N1831 Chronic kidney disease, stage 3a: Secondary | ICD-10-CM | POA: Diagnosis not present

## 2024-06-19 DIAGNOSIS — S3289XD Fracture of other parts of pelvis, subsequent encounter for fracture with routine healing: Secondary | ICD-10-CM | POA: Diagnosis not present

## 2024-06-19 DIAGNOSIS — E1159 Type 2 diabetes mellitus with other circulatory complications: Secondary | ICD-10-CM | POA: Diagnosis not present

## 2024-06-21 DIAGNOSIS — M069 Rheumatoid arthritis, unspecified: Secondary | ICD-10-CM | POA: Diagnosis not present

## 2024-06-21 DIAGNOSIS — S42202D Unspecified fracture of upper end of left humerus, subsequent encounter for fracture with routine healing: Secondary | ICD-10-CM | POA: Diagnosis not present

## 2024-06-21 DIAGNOSIS — S3289XD Fracture of other parts of pelvis, subsequent encounter for fracture with routine healing: Secondary | ICD-10-CM | POA: Diagnosis not present

## 2024-06-21 DIAGNOSIS — S42412D Displaced simple supracondylar fracture without intercondylar fracture of left humerus, subsequent encounter for fracture with routine healing: Secondary | ICD-10-CM | POA: Diagnosis not present

## 2024-06-21 DIAGNOSIS — Z794 Long term (current) use of insulin: Secondary | ICD-10-CM | POA: Diagnosis not present

## 2024-06-21 DIAGNOSIS — I129 Hypertensive chronic kidney disease with stage 1 through stage 4 chronic kidney disease, or unspecified chronic kidney disease: Secondary | ICD-10-CM | POA: Diagnosis not present

## 2024-06-21 DIAGNOSIS — E1159 Type 2 diabetes mellitus with other circulatory complications: Secondary | ICD-10-CM | POA: Diagnosis not present

## 2024-06-21 DIAGNOSIS — N1831 Chronic kidney disease, stage 3a: Secondary | ICD-10-CM | POA: Diagnosis not present

## 2024-06-21 DIAGNOSIS — E1122 Type 2 diabetes mellitus with diabetic chronic kidney disease: Secondary | ICD-10-CM | POA: Diagnosis not present

## 2024-06-26 DIAGNOSIS — E1122 Type 2 diabetes mellitus with diabetic chronic kidney disease: Secondary | ICD-10-CM | POA: Diagnosis not present

## 2024-06-26 DIAGNOSIS — M069 Rheumatoid arthritis, unspecified: Secondary | ICD-10-CM | POA: Diagnosis not present

## 2024-06-26 DIAGNOSIS — S42202D Unspecified fracture of upper end of left humerus, subsequent encounter for fracture with routine healing: Secondary | ICD-10-CM | POA: Diagnosis not present

## 2024-06-26 DIAGNOSIS — I129 Hypertensive chronic kidney disease with stage 1 through stage 4 chronic kidney disease, or unspecified chronic kidney disease: Secondary | ICD-10-CM | POA: Diagnosis not present

## 2024-06-26 DIAGNOSIS — S42412D Displaced simple supracondylar fracture without intercondylar fracture of left humerus, subsequent encounter for fracture with routine healing: Secondary | ICD-10-CM | POA: Diagnosis not present

## 2024-06-26 DIAGNOSIS — S3289XD Fracture of other parts of pelvis, subsequent encounter for fracture with routine healing: Secondary | ICD-10-CM | POA: Diagnosis not present

## 2024-06-26 DIAGNOSIS — N1831 Chronic kidney disease, stage 3a: Secondary | ICD-10-CM | POA: Diagnosis not present

## 2024-06-26 DIAGNOSIS — Z794 Long term (current) use of insulin: Secondary | ICD-10-CM | POA: Diagnosis not present

## 2024-06-26 DIAGNOSIS — E1159 Type 2 diabetes mellitus with other circulatory complications: Secondary | ICD-10-CM | POA: Diagnosis not present

## 2024-06-28 DIAGNOSIS — N1831 Chronic kidney disease, stage 3a: Secondary | ICD-10-CM | POA: Diagnosis not present

## 2024-06-28 DIAGNOSIS — S3289XD Fracture of other parts of pelvis, subsequent encounter for fracture with routine healing: Secondary | ICD-10-CM | POA: Diagnosis not present

## 2024-06-28 DIAGNOSIS — M069 Rheumatoid arthritis, unspecified: Secondary | ICD-10-CM | POA: Diagnosis not present

## 2024-06-28 DIAGNOSIS — I129 Hypertensive chronic kidney disease with stage 1 through stage 4 chronic kidney disease, or unspecified chronic kidney disease: Secondary | ICD-10-CM | POA: Diagnosis not present

## 2024-06-28 DIAGNOSIS — E1159 Type 2 diabetes mellitus with other circulatory complications: Secondary | ICD-10-CM | POA: Diagnosis not present

## 2024-06-28 DIAGNOSIS — S42412D Displaced simple supracondylar fracture without intercondylar fracture of left humerus, subsequent encounter for fracture with routine healing: Secondary | ICD-10-CM | POA: Diagnosis not present

## 2024-06-28 DIAGNOSIS — Z794 Long term (current) use of insulin: Secondary | ICD-10-CM | POA: Diagnosis not present

## 2024-06-28 DIAGNOSIS — E1122 Type 2 diabetes mellitus with diabetic chronic kidney disease: Secondary | ICD-10-CM | POA: Diagnosis not present

## 2024-06-28 DIAGNOSIS — S42202D Unspecified fracture of upper end of left humerus, subsequent encounter for fracture with routine healing: Secondary | ICD-10-CM | POA: Diagnosis not present

## 2024-06-29 DIAGNOSIS — E1159 Type 2 diabetes mellitus with other circulatory complications: Secondary | ICD-10-CM | POA: Diagnosis not present

## 2024-06-29 DIAGNOSIS — S3289XD Fracture of other parts of pelvis, subsequent encounter for fracture with routine healing: Secondary | ICD-10-CM | POA: Diagnosis not present

## 2024-06-29 DIAGNOSIS — M069 Rheumatoid arthritis, unspecified: Secondary | ICD-10-CM | POA: Diagnosis not present

## 2024-06-29 DIAGNOSIS — N1831 Chronic kidney disease, stage 3a: Secondary | ICD-10-CM | POA: Diagnosis not present

## 2024-06-29 DIAGNOSIS — Z794 Long term (current) use of insulin: Secondary | ICD-10-CM | POA: Diagnosis not present

## 2024-06-29 DIAGNOSIS — E1122 Type 2 diabetes mellitus with diabetic chronic kidney disease: Secondary | ICD-10-CM | POA: Diagnosis not present

## 2024-06-29 DIAGNOSIS — S42202D Unspecified fracture of upper end of left humerus, subsequent encounter for fracture with routine healing: Secondary | ICD-10-CM | POA: Diagnosis not present

## 2024-06-29 DIAGNOSIS — I129 Hypertensive chronic kidney disease with stage 1 through stage 4 chronic kidney disease, or unspecified chronic kidney disease: Secondary | ICD-10-CM | POA: Diagnosis not present

## 2024-06-29 DIAGNOSIS — S42412D Displaced simple supracondylar fracture without intercondylar fracture of left humerus, subsequent encounter for fracture with routine healing: Secondary | ICD-10-CM | POA: Diagnosis not present

## 2024-06-30 ENCOUNTER — Ambulatory Visit: Admitting: Surgical

## 2024-06-30 DIAGNOSIS — E1122 Type 2 diabetes mellitus with diabetic chronic kidney disease: Secondary | ICD-10-CM | POA: Diagnosis not present

## 2024-06-30 DIAGNOSIS — S42412D Displaced simple supracondylar fracture without intercondylar fracture of left humerus, subsequent encounter for fracture with routine healing: Secondary | ICD-10-CM | POA: Diagnosis not present

## 2024-06-30 DIAGNOSIS — N1831 Chronic kidney disease, stage 3a: Secondary | ICD-10-CM | POA: Diagnosis not present

## 2024-06-30 DIAGNOSIS — S3289XD Fracture of other parts of pelvis, subsequent encounter for fracture with routine healing: Secondary | ICD-10-CM | POA: Diagnosis not present

## 2024-06-30 DIAGNOSIS — E1159 Type 2 diabetes mellitus with other circulatory complications: Secondary | ICD-10-CM | POA: Diagnosis not present

## 2024-06-30 DIAGNOSIS — I129 Hypertensive chronic kidney disease with stage 1 through stage 4 chronic kidney disease, or unspecified chronic kidney disease: Secondary | ICD-10-CM | POA: Diagnosis not present

## 2024-06-30 DIAGNOSIS — M069 Rheumatoid arthritis, unspecified: Secondary | ICD-10-CM | POA: Diagnosis not present

## 2024-06-30 DIAGNOSIS — Z794 Long term (current) use of insulin: Secondary | ICD-10-CM | POA: Diagnosis not present

## 2024-06-30 DIAGNOSIS — S42202D Unspecified fracture of upper end of left humerus, subsequent encounter for fracture with routine healing: Secondary | ICD-10-CM | POA: Diagnosis not present

## 2024-07-03 DIAGNOSIS — S42412D Displaced simple supracondylar fracture without intercondylar fracture of left humerus, subsequent encounter for fracture with routine healing: Secondary | ICD-10-CM | POA: Diagnosis not present

## 2024-07-03 DIAGNOSIS — S3289XD Fracture of other parts of pelvis, subsequent encounter for fracture with routine healing: Secondary | ICD-10-CM | POA: Diagnosis not present

## 2024-07-03 DIAGNOSIS — I129 Hypertensive chronic kidney disease with stage 1 through stage 4 chronic kidney disease, or unspecified chronic kidney disease: Secondary | ICD-10-CM | POA: Diagnosis not present

## 2024-07-03 DIAGNOSIS — S42202D Unspecified fracture of upper end of left humerus, subsequent encounter for fracture with routine healing: Secondary | ICD-10-CM | POA: Diagnosis not present

## 2024-07-03 DIAGNOSIS — Z794 Long term (current) use of insulin: Secondary | ICD-10-CM | POA: Diagnosis not present

## 2024-07-03 DIAGNOSIS — M069 Rheumatoid arthritis, unspecified: Secondary | ICD-10-CM | POA: Diagnosis not present

## 2024-07-03 DIAGNOSIS — E1122 Type 2 diabetes mellitus with diabetic chronic kidney disease: Secondary | ICD-10-CM | POA: Diagnosis not present

## 2024-07-03 DIAGNOSIS — N1831 Chronic kidney disease, stage 3a: Secondary | ICD-10-CM | POA: Diagnosis not present

## 2024-07-03 DIAGNOSIS — E1159 Type 2 diabetes mellitus with other circulatory complications: Secondary | ICD-10-CM | POA: Diagnosis not present

## 2024-07-06 DIAGNOSIS — M069 Rheumatoid arthritis, unspecified: Secondary | ICD-10-CM | POA: Diagnosis not present

## 2024-07-06 DIAGNOSIS — S3289XD Fracture of other parts of pelvis, subsequent encounter for fracture with routine healing: Secondary | ICD-10-CM | POA: Diagnosis not present

## 2024-07-06 DIAGNOSIS — Z794 Long term (current) use of insulin: Secondary | ICD-10-CM | POA: Diagnosis not present

## 2024-07-06 DIAGNOSIS — I129 Hypertensive chronic kidney disease with stage 1 through stage 4 chronic kidney disease, or unspecified chronic kidney disease: Secondary | ICD-10-CM | POA: Diagnosis not present

## 2024-07-06 DIAGNOSIS — E1122 Type 2 diabetes mellitus with diabetic chronic kidney disease: Secondary | ICD-10-CM | POA: Diagnosis not present

## 2024-07-06 DIAGNOSIS — E1159 Type 2 diabetes mellitus with other circulatory complications: Secondary | ICD-10-CM | POA: Diagnosis not present

## 2024-07-06 DIAGNOSIS — N1831 Chronic kidney disease, stage 3a: Secondary | ICD-10-CM | POA: Diagnosis not present

## 2024-07-06 DIAGNOSIS — S42202D Unspecified fracture of upper end of left humerus, subsequent encounter for fracture with routine healing: Secondary | ICD-10-CM | POA: Diagnosis not present

## 2024-07-06 DIAGNOSIS — S42412D Displaced simple supracondylar fracture without intercondylar fracture of left humerus, subsequent encounter for fracture with routine healing: Secondary | ICD-10-CM | POA: Diagnosis not present

## 2024-07-07 DIAGNOSIS — N1831 Chronic kidney disease, stage 3a: Secondary | ICD-10-CM | POA: Diagnosis not present

## 2024-07-07 DIAGNOSIS — S3289XD Fracture of other parts of pelvis, subsequent encounter for fracture with routine healing: Secondary | ICD-10-CM | POA: Diagnosis not present

## 2024-07-07 DIAGNOSIS — M069 Rheumatoid arthritis, unspecified: Secondary | ICD-10-CM | POA: Diagnosis not present

## 2024-07-07 DIAGNOSIS — Z794 Long term (current) use of insulin: Secondary | ICD-10-CM | POA: Diagnosis not present

## 2024-07-07 DIAGNOSIS — S42202D Unspecified fracture of upper end of left humerus, subsequent encounter for fracture with routine healing: Secondary | ICD-10-CM | POA: Diagnosis not present

## 2024-07-07 DIAGNOSIS — E1159 Type 2 diabetes mellitus with other circulatory complications: Secondary | ICD-10-CM | POA: Diagnosis not present

## 2024-07-07 DIAGNOSIS — I129 Hypertensive chronic kidney disease with stage 1 through stage 4 chronic kidney disease, or unspecified chronic kidney disease: Secondary | ICD-10-CM | POA: Diagnosis not present

## 2024-07-07 DIAGNOSIS — S42412D Displaced simple supracondylar fracture without intercondylar fracture of left humerus, subsequent encounter for fracture with routine healing: Secondary | ICD-10-CM | POA: Diagnosis not present

## 2024-07-07 DIAGNOSIS — E1122 Type 2 diabetes mellitus with diabetic chronic kidney disease: Secondary | ICD-10-CM | POA: Diagnosis not present

## 2024-07-10 DIAGNOSIS — M0589 Other rheumatoid arthritis with rheumatoid factor of multiple sites: Secondary | ICD-10-CM | POA: Diagnosis not present

## 2024-07-11 DIAGNOSIS — N1831 Chronic kidney disease, stage 3a: Secondary | ICD-10-CM | POA: Diagnosis not present

## 2024-07-11 DIAGNOSIS — Z794 Long term (current) use of insulin: Secondary | ICD-10-CM | POA: Diagnosis not present

## 2024-07-11 DIAGNOSIS — S3289XD Fracture of other parts of pelvis, subsequent encounter for fracture with routine healing: Secondary | ICD-10-CM | POA: Diagnosis not present

## 2024-07-11 DIAGNOSIS — I129 Hypertensive chronic kidney disease with stage 1 through stage 4 chronic kidney disease, or unspecified chronic kidney disease: Secondary | ICD-10-CM | POA: Diagnosis not present

## 2024-07-11 DIAGNOSIS — S42202D Unspecified fracture of upper end of left humerus, subsequent encounter for fracture with routine healing: Secondary | ICD-10-CM | POA: Diagnosis not present

## 2024-07-11 DIAGNOSIS — S42412D Displaced simple supracondylar fracture without intercondylar fracture of left humerus, subsequent encounter for fracture with routine healing: Secondary | ICD-10-CM | POA: Diagnosis not present

## 2024-07-11 DIAGNOSIS — E1122 Type 2 diabetes mellitus with diabetic chronic kidney disease: Secondary | ICD-10-CM | POA: Diagnosis not present

## 2024-07-11 DIAGNOSIS — E1159 Type 2 diabetes mellitus with other circulatory complications: Secondary | ICD-10-CM | POA: Diagnosis not present

## 2024-07-11 DIAGNOSIS — M069 Rheumatoid arthritis, unspecified: Secondary | ICD-10-CM | POA: Diagnosis not present

## 2024-07-12 DIAGNOSIS — M069 Rheumatoid arthritis, unspecified: Secondary | ICD-10-CM | POA: Diagnosis not present

## 2024-07-12 DIAGNOSIS — Z794 Long term (current) use of insulin: Secondary | ICD-10-CM | POA: Diagnosis not present

## 2024-07-12 DIAGNOSIS — N1831 Chronic kidney disease, stage 3a: Secondary | ICD-10-CM | POA: Diagnosis not present

## 2024-07-12 DIAGNOSIS — E1122 Type 2 diabetes mellitus with diabetic chronic kidney disease: Secondary | ICD-10-CM | POA: Diagnosis not present

## 2024-07-12 DIAGNOSIS — E1159 Type 2 diabetes mellitus with other circulatory complications: Secondary | ICD-10-CM | POA: Diagnosis not present

## 2024-07-12 DIAGNOSIS — S3289XD Fracture of other parts of pelvis, subsequent encounter for fracture with routine healing: Secondary | ICD-10-CM | POA: Diagnosis not present

## 2024-07-12 DIAGNOSIS — I129 Hypertensive chronic kidney disease with stage 1 through stage 4 chronic kidney disease, or unspecified chronic kidney disease: Secondary | ICD-10-CM | POA: Diagnosis not present

## 2024-07-12 DIAGNOSIS — S42412D Displaced simple supracondylar fracture without intercondylar fracture of left humerus, subsequent encounter for fracture with routine healing: Secondary | ICD-10-CM | POA: Diagnosis not present

## 2024-07-12 DIAGNOSIS — S42202D Unspecified fracture of upper end of left humerus, subsequent encounter for fracture with routine healing: Secondary | ICD-10-CM | POA: Diagnosis not present

## 2024-07-13 DIAGNOSIS — S42202D Unspecified fracture of upper end of left humerus, subsequent encounter for fracture with routine healing: Secondary | ICD-10-CM | POA: Diagnosis not present

## 2024-07-13 DIAGNOSIS — S42412D Displaced simple supracondylar fracture without intercondylar fracture of left humerus, subsequent encounter for fracture with routine healing: Secondary | ICD-10-CM | POA: Diagnosis not present

## 2024-07-20 DIAGNOSIS — Z79899 Other long term (current) drug therapy: Secondary | ICD-10-CM | POA: Diagnosis not present

## 2024-07-20 DIAGNOSIS — M25462 Effusion, left knee: Secondary | ICD-10-CM | POA: Diagnosis not present

## 2024-07-20 DIAGNOSIS — M810 Age-related osteoporosis without current pathological fracture: Secondary | ICD-10-CM | POA: Diagnosis not present

## 2024-07-20 DIAGNOSIS — M25562 Pain in left knee: Secondary | ICD-10-CM | POA: Diagnosis not present

## 2024-07-20 DIAGNOSIS — M170 Bilateral primary osteoarthritis of knee: Secondary | ICD-10-CM | POA: Diagnosis not present

## 2024-07-20 DIAGNOSIS — M0589 Other rheumatoid arthritis with rheumatoid factor of multiple sites: Secondary | ICD-10-CM | POA: Diagnosis not present

## 2024-07-24 ENCOUNTER — Ambulatory Visit: Admitting: Surgical

## 2024-07-24 ENCOUNTER — Other Ambulatory Visit (INDEPENDENT_AMBULATORY_CARE_PROVIDER_SITE_OTHER): Payer: Self-pay

## 2024-07-24 DIAGNOSIS — R102 Pelvic and perineal pain: Secondary | ICD-10-CM | POA: Diagnosis not present

## 2024-07-24 DIAGNOSIS — E1122 Type 2 diabetes mellitus with diabetic chronic kidney disease: Secondary | ICD-10-CM | POA: Diagnosis not present

## 2024-07-24 DIAGNOSIS — E1159 Type 2 diabetes mellitus with other circulatory complications: Secondary | ICD-10-CM | POA: Diagnosis not present

## 2024-07-24 DIAGNOSIS — S42202D Unspecified fracture of upper end of left humerus, subsequent encounter for fracture with routine healing: Secondary | ICD-10-CM | POA: Diagnosis not present

## 2024-07-24 DIAGNOSIS — Z96612 Presence of left artificial shoulder joint: Secondary | ICD-10-CM

## 2024-07-24 DIAGNOSIS — M069 Rheumatoid arthritis, unspecified: Secondary | ICD-10-CM | POA: Diagnosis not present
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
## 2024-07-27 ENCOUNTER — Other Ambulatory Visit: Payer: Self-pay

## 2024-07-27 ENCOUNTER — Ambulatory Visit: Attending: Surgical | Admitting: Physical Therapy

## 2024-07-27 ENCOUNTER — Encounter: Payer: Self-pay | Admitting: Physical Therapy

## 2024-07-27 DIAGNOSIS — R102 Pelvic and perineal pain: Secondary | ICD-10-CM | POA: Insufficient documentation

## 2024-07-27 DIAGNOSIS — M25612 Stiffness of left shoulder, not elsewhere classified: Secondary | ICD-10-CM | POA: Diagnosis not present

## 2024-07-27 DIAGNOSIS — M6281 Muscle weakness (generalized): Secondary | ICD-10-CM | POA: Insufficient documentation

## 2024-07-27 DIAGNOSIS — R2681 Unsteadiness on feet: Secondary | ICD-10-CM | POA: Diagnosis not present

## 2024-07-30 ENCOUNTER — Encounter: Payer: Self-pay | Admitting: Surgical
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
## 2024-08-04 ENCOUNTER — Ambulatory Visit: Admitting: Physical Therapy

## 2024-08-04 ENCOUNTER — Encounter: Payer: Self-pay | Admitting: Physical Therapy

## 2024-08-04 DIAGNOSIS — R2681 Unsteadiness on feet: Secondary | ICD-10-CM

## 2024-08-04 DIAGNOSIS — M25612 Stiffness of left shoulder, not elsewhere classified: Secondary | ICD-10-CM

## 2024-08-04 DIAGNOSIS — M6281 Muscle weakness (generalized): Secondary | ICD-10-CM

## 2024-08-04 DIAGNOSIS — R102 Pelvic and perineal pain: Secondary | ICD-10-CM | POA: Diagnosis not present
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
## 2024-08-07 DIAGNOSIS — M0589 Other rheumatoid arthritis with rheumatoid factor of multiple sites: Secondary | ICD-10-CM | POA: Diagnosis not present
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
## 2024-08-11 ENCOUNTER — Ambulatory Visit: Admitting: Physical Therapy

## 2024-08-11 ENCOUNTER — Encounter: Payer: Self-pay | Admitting: Physical Therapy

## 2024-08-11 DIAGNOSIS — M6281 Muscle weakness (generalized): Secondary | ICD-10-CM | POA: Diagnosis not present

## 2024-08-11 DIAGNOSIS — R102 Pelvic and perineal pain: Secondary | ICD-10-CM | POA: Diagnosis not present

## 2024-08-11 DIAGNOSIS — R2681 Unsteadiness on feet: Secondary | ICD-10-CM | POA: Diagnosis not present

## 2024-08-11 DIAGNOSIS — M25612 Stiffness of left shoulder, not elsewhere classified: Secondary | ICD-10-CM

## 2024-08-13 DIAGNOSIS — S42412D Displaced simple supracondylar fracture without intercondylar fracture of left humerus, subsequent encounter for fracture with routine healing: Secondary | ICD-10-CM | POA: Diagnosis not present

## 2024-08-13 DIAGNOSIS — S42202D Unspecified fracture of upper end of left humerus, subsequent encounter for fracture with routine healing: Secondary | ICD-10-CM | POA: Diagnosis not present

## 2024-08-18 ENCOUNTER — Ambulatory Visit: Admitting: Physical Therapy
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
## 2024-08-21 ENCOUNTER — Ambulatory Visit: Admitting: Physical Therapy

## 2024-08-21 DIAGNOSIS — I129 Hypertensive chronic kidney disease with stage 1 through stage 4 chronic kidney disease, or unspecified chronic kidney disease: Secondary | ICD-10-CM | POA: Diagnosis not present

## 2024-08-21 DIAGNOSIS — E1159 Type 2 diabetes mellitus with other circulatory complications: Secondary | ICD-10-CM | POA: Diagnosis not present

## 2024-08-21 DIAGNOSIS — E785 Hyperlipidemia, unspecified: Secondary | ICD-10-CM | POA: Diagnosis not present

## 2024-08-21 DIAGNOSIS — R946 Abnormal results of thyroid function studies: Secondary | ICD-10-CM | POA: Diagnosis not present

## 2024-08-21 DIAGNOSIS — M25561 Pain in right knee: Secondary | ICD-10-CM | POA: Diagnosis not present

## 2024-08-21 DIAGNOSIS — M0589 Other rheumatoid arthritis with rheumatoid factor of multiple sites: Secondary | ICD-10-CM | POA: Diagnosis not present

## 2024-08-21 DIAGNOSIS — M810 Age-related osteoporosis without current pathological fracture: Secondary | ICD-10-CM | POA: Diagnosis not present

## 2024-08-21 DIAGNOSIS — N1831 Chronic kidney disease, stage 3a: Secondary | ICD-10-CM | POA: Diagnosis not present

## 2024-08-21 DIAGNOSIS — Z79899 Other long term (current) drug therapy: Secondary | ICD-10-CM | POA: Diagnosis not present

## 2024-08-21 DIAGNOSIS — M170 Bilateral primary osteoarthritis of knee: Secondary | ICD-10-CM | POA: Diagnosis not present

## 2024-08-25 ENCOUNTER — Encounter: Admitting: Physical Therapy

## 2024-08-29 DIAGNOSIS — I10 Essential (primary) hypertension: Secondary | ICD-10-CM | POA: Diagnosis not present

## 2024-08-29 DIAGNOSIS — K219 Gastro-esophageal reflux disease without esophagitis: Secondary | ICD-10-CM | POA: Diagnosis not present

## 2024-08-29 DIAGNOSIS — Z0000 Encounter for general adult medical examination without abnormal findings: Secondary | ICD-10-CM | POA: Diagnosis not present

## 2024-08-29 DIAGNOSIS — Z23 Encounter for immunization: Secondary | ICD-10-CM | POA: Diagnosis not present

## 2024-08-29 DIAGNOSIS — M0589 Other rheumatoid arthritis with rheumatoid factor of multiple sites: Secondary | ICD-10-CM | POA: Diagnosis not present

## 2024-08-29 DIAGNOSIS — Z955 Presence of coronary angioplasty implant and graft: Secondary | ICD-10-CM | POA: Diagnosis not present

## 2024-08-29 DIAGNOSIS — E1159 Type 2 diabetes mellitus with other circulatory complications: Secondary | ICD-10-CM | POA: Diagnosis not present

## 2024-08-29 DIAGNOSIS — Z951 Presence of aortocoronary bypass graft: Secondary | ICD-10-CM | POA: Diagnosis not present

## 2024-09-01 ENCOUNTER — Ambulatory Visit: Attending: Surgical | Admitting: Physical Therapy

## 2024-09-01 ENCOUNTER — Encounter: Payer: Self-pay | Admitting: Physical Therapy

## 2024-09-01 DIAGNOSIS — M25612 Stiffness of left shoulder, not elsewhere classified: Secondary | ICD-10-CM | POA: Diagnosis not present

## 2024-09-01 DIAGNOSIS — R2681 Unsteadiness on feet: Secondary | ICD-10-CM | POA: Insufficient documentation

## 2024-09-01 DIAGNOSIS — M6281 Muscle weakness (generalized): Secondary | ICD-10-CM | POA: Insufficient documentation
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
## 2024-09-04 ENCOUNTER — Encounter: Payer: Self-pay | Admitting: Surgical

## 2024-09-04 ENCOUNTER — Ambulatory Visit: Admitting: Surgical

## 2024-09-04 DIAGNOSIS — Z96612 Presence of left artificial shoulder joint: Secondary | ICD-10-CM
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
## 2024-09-05 DIAGNOSIS — M0589 Other rheumatoid arthritis with rheumatoid factor of multiple sites: Secondary | ICD-10-CM | POA: Diagnosis not present
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
## 2024-09-08 ENCOUNTER — Ambulatory Visit: Admitting: Physical Therapy

## 2024-09-08 ENCOUNTER — Encounter: Payer: Self-pay | Admitting: Physical Therapy

## 2024-09-08 DIAGNOSIS — M25612 Stiffness of left shoulder, not elsewhere classified: Secondary | ICD-10-CM | POA: Diagnosis not present

## 2024-09-08 DIAGNOSIS — R2681 Unsteadiness on feet: Secondary | ICD-10-CM | POA: Diagnosis not present

## 2024-09-08 DIAGNOSIS — M6281 Muscle weakness (generalized): Secondary | ICD-10-CM

## 2024-09-12 DIAGNOSIS — S42202D Unspecified fracture of upper end of left humerus, subsequent encounter for fracture with routine healing: Secondary | ICD-10-CM | POA: Diagnosis not present

## 2024-09-12 DIAGNOSIS — S42412D Displaced simple supracondylar fracture without intercondylar fracture of left humerus, subsequent encounter for fracture with routine healing: Secondary | ICD-10-CM | POA: Diagnosis not present

## 2024-09-15 ENCOUNTER — Ambulatory Visit: Admitting: Physical Therapy

## 2024-09-15 ENCOUNTER — Encounter: Payer: Self-pay | Admitting: Physical Therapy

## 2024-09-15 DIAGNOSIS — M6281 Muscle weakness (generalized): Secondary | ICD-10-CM | POA: Diagnosis not present

## 2024-09-15 DIAGNOSIS — M25612 Stiffness of left shoulder, not elsewhere classified: Secondary | ICD-10-CM

## 2024-09-15 DIAGNOSIS — R2681 Unsteadiness on feet: Secondary | ICD-10-CM | POA: Diagnosis not present
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
## 2024-09-18 ENCOUNTER — Encounter: Payer: Self-pay | Admitting: Radiology

## 2024-09-22 ENCOUNTER — Encounter: Admitting: Physical Therapy

## 2024-10-03 DIAGNOSIS — M0589 Other rheumatoid arthritis with rheumatoid factor of multiple sites: Secondary | ICD-10-CM | POA: Diagnosis not present

## 2024-10-13 DIAGNOSIS — S42202D Unspecified fracture of upper end of left humerus, subsequent encounter for fracture with routine healing: Secondary | ICD-10-CM | POA: Diagnosis not present

## 2024-10-13 DIAGNOSIS — S42412D Displaced simple supracondylar fracture without intercondylar fracture of left humerus, subsequent encounter for fracture with routine healing: Secondary | ICD-10-CM | POA: Diagnosis not present

## 2024-11-06 ENCOUNTER — Other Ambulatory Visit: Payer: Self-pay

## 2024-11-07 MED ORDER — LOSARTAN POTASSIUM 25 MG PO TABS: ORAL_TABLET | ORAL | 0 refills | Status: DC

## 2024-11-15 ENCOUNTER — Other Ambulatory Visit: Payer: Self-pay | Admitting: Nurse Practitioner

## 2024-11-15 MED ORDER — CLOPIDOGREL BISULFATE 75 MG PO TABS: ORAL_TABLET | ORAL | 0 refills | Status: DC

## 2024-12-05 ENCOUNTER — Encounter: Payer: Self-pay | Admitting: *Deleted

## 2024-12-06 ENCOUNTER — Encounter: Payer: Self-pay | Admitting: Internal Medicine

## 2024-12-06 ENCOUNTER — Ambulatory Visit: Attending: Internal Medicine | Admitting: Internal Medicine

## 2024-12-06 VITALS — BP 152/74 | HR 66 | Resp 16 | Ht 61.0 in | Wt 134.5 lb

## 2024-12-06 DIAGNOSIS — I370 Nonrheumatic pulmonary valve stenosis: Secondary | ICD-10-CM | POA: Diagnosis not present

## 2024-12-06 DIAGNOSIS — Z951 Presence of aortocoronary bypass graft: Secondary | ICD-10-CM

## 2024-12-06 DIAGNOSIS — E782 Mixed hyperlipidemia: Secondary | ICD-10-CM | POA: Diagnosis not present

## 2024-12-06 DIAGNOSIS — I10 Essential (primary) hypertension: Secondary | ICD-10-CM

## 2024-12-06 DIAGNOSIS — Z789 Other specified health status: Secondary | ICD-10-CM

## 2024-12-06 DIAGNOSIS — I2510 Atherosclerotic heart disease of native coronary artery without angina pectoris: Secondary | ICD-10-CM | POA: Diagnosis not present

## 2024-12-06 NOTE — Patient Instructions (Signed)
 Medication Instructions:  Your physician recommends that you continue on your current medications as directed. Please refer to the Current Medication list given to you today.  *If you need a refill on your cardiac medications before your next appointment, please call your pharmacy*  Lab Work: None ordered If you have labs (blood work) drawn today and your tests are completely normal, you will receive your results only by: MyChart Message (if you have MyChart) OR A paper copy in the mail If you have any lab test that is abnormal or we need to change your treatment, we will call you to review the results.  Testing/Procedures: Echocardiogram  Your physician has requested that you have an echocardiogram. Echocardiography is a painless test that uses sound waves to create images of your heart. It provides your doctor with information about the size and shape of your heart and how well your hearts chambers and valves are working. This procedure takes approximately one hour. There are no restrictions for this procedure. Please do NOT wear cologne, perfume, aftershave, or lotions (deodorant is allowed). Please arrive 15 minutes prior to your appointment time.  Please note: We ask at that you not bring children with you during ultrasound (echo/ vascular) testing. Due to room size and safety concerns, children are not allowed in the ultrasound rooms during exams. Our front office staff cannot provide observation of children in our lobby area while testing is being conducted. An adult accompanying a patient to their appointment will only be allowed in the ultrasound room at the discretion of the ultrasound technician under special circumstances. We apologize for any inconvenience.   Follow-Up: At [facility], you and your health needs are our priority.  As part of our continuing mission to provide you with exceptional heart care, our providers are all part of one team.  This team includes your  primary Cardiologist (physician) and Advanced Practice Providers or APPs (Physician Assistants and Nurse Practitioners) who all work together to provide you with the care you need, when you need it.  Your next appointment:   1 year(s)  Provider:   Emeline FORBES Calender, DO    We recommend signing up for the patient portal called MyChart.  Sign up information is provided on this After Visit Summary.  MyChart is used to connect with patients for Virtual Visits (Telemedicine).  Patients are able to view lab/test results, encounter notes, upcoming appointments, etc.  Non-urgent messages can be sent to your provider as well.   To learn more about what you can do with MyChart, go to [email or web address].

## 2024-12-06 NOTE — Progress Notes (Signed)
 " Cardiology Office Note:  .   Date:  12/06/2024  ID:  [name] [name], DOB 1941/11/05, MRN [id number] PCP: Gerome Brunet, DO  [facility] Providers Cardiologist:  Aleene Passe, MD (Inactive) Cardiology APP:  Lelon Glendia DASEN, PA-C    History of Present Illness: .     Discussed the use of AI scribe software for clinical note transcription with the patient, who gave verbal consent to proceed.  History of Present Illness [name] [name] is an 84 year old female with coronary artery disease and hyperlipidemia who presents for an annual follow-up. She is accompanied by her daughter, Graeme.  Coronary artery disease and post-cabg status - History of coronary artery disease and carotid artery disease. - Status post coronary artery bypass grafting (CABG) x3: LIMA to LAD, SVG to OM, SVG to PL. - Drug-eluting stent placed in mid LAD through LIMA graft in 2023. - Current medications: aspirin  81 mg, Plavix  75 mg, metoprolol  tartrate 12.5 mg twice daily. - Echocardiogram on May 07, 2022: ejection fraction 60-65%, no regional wall motion abnormalities, no significant valvular abnormalities.  Hypertension - Longstanding hypertension without recent worsening. - Current antihypertensive regimen: losartan  25 mg daily, Imdur  60 mg daily. - Blood pressure at visit: 152/74 mmHg (medications not taken on day of visit).  Peripheral edema and diuretic use - History of lower extremity swelling due to nonadherence with Lasix  for at least six months, resulting in fluid retention. - Resumed daily Lasix  40 mg three weeks ago with resolution of swelling. - Takes potassium supplements when using Lasix .  Hyperlipidemia and statin intolerance - Hyperlipidemia with intolerance to statins due to severe adverse effects. - Not currently on cholesterol-lowering medications; has not tried injectable lipid-lowering agents. - Cholesterol levels monitored periodically by primary care physician.  Rheumatoid  arthritis and diabetes mellitus - Rheumatoid arthritis and insulin -dependent type 2 diabetes mellitus. - No current symptoms or issues related to these conditions reported at this visit.  Functional status and lifestyle - No tobacco use. - Engages in indoor walking for exercise without symptoms.       Hypertension  Carotid artery disease  CAD post CABG x 3 (LIMA-LAD, SVG-OM, SVG-PL) s/p DES to mid LAD through LIMA graft S/p MI 06/1996 tx with POBA to LCx Cath 12/2018: dLAD 80, pRCA 85 (small) >> med Rx; PCI of dLAD only if refractory angina Intolerant of Toprol  (arthralgias) >> changed to Imdur  S/p CABG in 1/23 (LIMA-LAD, SVG-OM, SVG-PL) Successful PCI of the mid LAD through the LIMA graft with a 2.25 x 24 Synergy drug-eluting stent 05/22/2022. Hyperlipidemia with statin intolerance Rheumatoid arthritis Insulin -dependent type 2 diabetes Mild pulmonic stenosis noted on echo in 2023    ROS: Remaining review of systems negative  Studies Reviewed: SABRA   EKG Interpretation Date/Time:  Wednesday December 06 2024 09:41:30 EST Ventricular Rate:  68 PR Interval:  150 QRS Duration:  82 QT Interval:  406 QTC Calculation: 431 R Axis:   106  Text Interpretation: Normal sinus rhythm Rightward axis Cannot rule out Inferior infarct (cited on or before 05-Apr-2024) When compared with ECG of 05-Apr-2024 09:14, QRS axis Shifted right Nonspecific T wave abnormality has replaced inverted T waves in Inferior leads T wave inversion no longer evident in Anterior leads Confirmed by Kriste Hicks [phone]) on 12/06/2024 9:45:38 AM    Results Diagnostic Echocardiogram (05/07/2022): Left ventricular ejection fraction 60-65%, no regional wall motion abnormalities, no hemodynamically significant valvular abnormalities, mild pulmonic stenosis Risk Assessment/Calculations:       Physical  Exam:   VS:  BP (!) 152/74 (BP Location: Left Arm, Patient Position: Sitting, Cuff Size: Normal)   Pulse 66   Resp 16   Ht  5' 1 (1.549 m)   Wt 134 lb 8 oz (61 kg)   SpO2 99%   BMI 25.41 kg/m    Wt Readings from Last 3 Encounters:  12/06/24 134 lb 8 oz (61 kg)  03/27/24 134 lb 0.6 oz (60.8 kg)  08/23/23 134 lb (60.8 kg)    GEN: Well nourished, well developed in no acute distress NECK: No JVD; No carotid bruits CARDIAC:  RRR, 3/6 systolic, no rubs, no gallops RESPIRATORY:  Clear to auscultation without rales, wheezing or rhonchi  ABDOMEN: Soft, non-tender, non-distended EXTREMITIES: Trace lower extremity edema bilaterally; No deformity   ASSESSMENT AND PLAN: .    Assessment and Plan Assessment & Plan CAD post CABG x 3 (LIMA-LAD, SVG-OM, SVG-PL) s/p DES to mid LAD through LIMA graft, without angina S/p MI 06/1996 tx with POBA to LCx Cath 12/2018: dLAD 80, pRCA 85 (small) >> med Rx; PCI of dLAD only if refractory angina Intolerant of Toprol  (arthralgias) >> changed to Imdur  S/p CABG in 1/23 (LIMA-LAD, SVG-OM, SVG-PL) Successful PCI of the mid LAD through the LIMA graft with a 2.25 x 24 Synergy drug-eluting stent 05/22/2022. Stable on current medications with no recent symptoms. Echocardiogram showed normal EF and no significant abnormalities. - Continue aspirin  81 mg daily. - Continue Plavix  75 mg daily. - Continue Imdur  60 mg daily.  Hypertension Blood pressure elevated today likely due to missed medication. No symptom exacerbation. - Continue losartan  25 mg daily. - Advised to take medications as prescribed.  Hyperlipidemia with statin intolerance Patient hesitant to start new medications and wishes to discuss with PCP.  - LDL goal <55 mg/dL due to CAD history. - Will discuss potential future cholesterol management if she is willing to start medication but will defer to PCP for now.  Mild pulmonic stenosis Mild pulmonic stenosis noted on 2023 echocardiogram. - Will repeat echocardiogram  Patient reported volume overload, currently euvolemic - Continue Lasix  as prescribed            Follow  up: 1 year  Signed, Emeline FORBES Calender, DO  12/06/2024 10:03 AM    [facility] "

## 2024-12-14 ENCOUNTER — Other Ambulatory Visit: Payer: Self-pay | Admitting: Nurse Practitioner

## 2024-12-14 ENCOUNTER — Other Ambulatory Visit: Payer: Self-pay | Admitting: Physician Assistant

## 2024-12-19 ENCOUNTER — Other Ambulatory Visit: Payer: Self-pay

## 2024-12-20 ENCOUNTER — Other Ambulatory Visit: Payer: Self-pay | Admitting: Internal Medicine

## 2024-12-21 MED ORDER — METOPROLOL TARTRATE 25 MG PO TABS: 25.0000 mg | ORAL_TABLET | Freq: Two times a day (BID) | ORAL | 2 refills | Status: AC

## 2025-01-04 ENCOUNTER — Ambulatory Visit (HOSPITAL_COMMUNITY)
